# Patient Record
Sex: Female | Born: 1985
Health system: Southern US, Community
[De-identification: ages and names within clinical notes are randomized; demographics above are authoritative.]

## PROBLEM LIST (undated history)

## (undated) ENCOUNTER — Ambulatory Visit

## (undated) ENCOUNTER — Inpatient Hospital Stay (HOSPITAL_COMMUNITY): Payer: Self-pay

## (undated) DIAGNOSIS — D249 Benign neoplasm of unspecified breast: Secondary | ICD-10-CM

## (undated) DIAGNOSIS — K219 Gastro-esophageal reflux disease without esophagitis: Secondary | ICD-10-CM

## (undated) DIAGNOSIS — I499 Cardiac arrhythmia, unspecified: Secondary | ICD-10-CM

## (undated) DIAGNOSIS — Z8669 Personal history of other diseases of the nervous system and sense organs: Secondary | ICD-10-CM

## (undated) DIAGNOSIS — K649 Unspecified hemorrhoids: Secondary | ICD-10-CM

## (undated) DIAGNOSIS — T8859XA Other complications of anesthesia, initial encounter: Secondary | ICD-10-CM

## (undated) DIAGNOSIS — Z8679 Personal history of other diseases of the circulatory system: Secondary | ICD-10-CM

## (undated) DIAGNOSIS — G43909 Migraine, unspecified, not intractable, without status migrainosus: Secondary | ICD-10-CM

## (undated) DIAGNOSIS — Z3490 Encounter for supervision of normal pregnancy, unspecified, unspecified trimester: Secondary | ICD-10-CM

## (undated) DIAGNOSIS — E669 Obesity, unspecified: Secondary | ICD-10-CM

## (undated) DIAGNOSIS — Z8719 Personal history of other diseases of the digestive system: Secondary | ICD-10-CM

## (undated) DIAGNOSIS — E282 Polycystic ovarian syndrome: Secondary | ICD-10-CM

## (undated) DIAGNOSIS — T4145XA Adverse effect of unspecified anesthetic, initial encounter: Secondary | ICD-10-CM

## (undated) DIAGNOSIS — Z8489 Family history of other specified conditions: Secondary | ICD-10-CM

## (undated) DIAGNOSIS — G35 Multiple sclerosis: Secondary | ICD-10-CM

## (undated) DIAGNOSIS — R011 Cardiac murmur, unspecified: Secondary | ICD-10-CM

## (undated) DIAGNOSIS — M47819 Spondylosis without myelopathy or radiculopathy, site unspecified: Secondary | ICD-10-CM

## (undated) DIAGNOSIS — O039 Complete or unspecified spontaneous abortion without complication: Secondary | ICD-10-CM

## (undated) HISTORY — DX: Multiple sclerosis: G35

## (undated) HISTORY — PX: CARDIAC ELECTROPHYSIOLOGY MAPPING AND ABLATION: SHX1292

## (undated) HISTORY — DX: Complete or unspecified spontaneous abortion without complication: O03.9

## (undated) HISTORY — DX: Polycystic ovarian syndrome: E28.2

## (undated) HISTORY — PX: CARDIAC ELECTROPHYSIOLOGY STUDY AND ABLATION: SHX1294

## (undated) HISTORY — DX: Encounter for supervision of normal pregnancy, unspecified, unspecified trimester: Z34.90

## (undated) HISTORY — DX: Personal history of other diseases of the digestive system: Z87.19

## (undated) HISTORY — PX: UNILATERAL SALPINGECTOMY: SHX6160

## (undated) HISTORY — PX: TOENAIL EXCISION: SHX183

## (undated) HISTORY — PX: OVARIAN CYST REMOVAL: SHX89

## (undated) HISTORY — DX: Cardiac murmur, unspecified: R01.1

---
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
## 2004-05-24 ENCOUNTER — Ambulatory Visit (HOSPITAL_COMMUNITY): Admission: RE | Admit: 2004-05-24 | Discharge: 2004-05-24 | Payer: Self-pay | Admitting: Obstetrics

## 2004-06-12 ENCOUNTER — Encounter: Admission: RE | Admit: 2004-06-12 | Discharge: 2004-06-12 | Payer: Self-pay | Admitting: Obstetrics

## 2004-07-27 ENCOUNTER — Ambulatory Visit (HOSPITAL_COMMUNITY): Admission: RE | Admit: 2004-07-27 | Discharge: 2004-07-27 | Payer: Self-pay | Admitting: Surgery

## 2004-07-27 ENCOUNTER — Encounter (INDEPENDENT_AMBULATORY_CARE_PROVIDER_SITE_OTHER): Payer: Self-pay | Admitting: *Deleted

## 2004-07-27 HISTORY — PX: BREAST SURGERY: SHX581

## 2006-04-02 ENCOUNTER — Encounter: Payer: Self-pay | Admitting: Pediatrics

## 2006-09-04 ENCOUNTER — Emergency Department (HOSPITAL_COMMUNITY): Admission: EM | Admit: 2006-09-04 | Discharge: 2006-09-04 | Payer: Self-pay | Admitting: Family Medicine

## 2008-03-29 ENCOUNTER — Emergency Department (HOSPITAL_COMMUNITY): Admission: EM | Admit: 2008-03-29 | Discharge: 2008-03-30 | Payer: Self-pay | Admitting: Emergency Medicine

## 2008-04-27 ENCOUNTER — Encounter (INDEPENDENT_AMBULATORY_CARE_PROVIDER_SITE_OTHER): Payer: Self-pay | Admitting: General Surgery

## 2008-04-27 ENCOUNTER — Ambulatory Visit (HOSPITAL_COMMUNITY): Admission: RE | Admit: 2008-04-27 | Discharge: 2008-04-28 | Payer: Self-pay | Admitting: General Surgery

## 2008-04-27 HISTORY — PX: CHOLECYSTECTOMY: SHX55

## 2008-07-18 ENCOUNTER — Emergency Department (HOSPITAL_COMMUNITY): Admission: EM | Admit: 2008-07-18 | Discharge: 2008-07-18 | Payer: Self-pay | Admitting: Emergency Medicine

## 2008-08-19 ENCOUNTER — Emergency Department (HOSPITAL_COMMUNITY): Admission: EM | Admit: 2008-08-19 | Discharge: 2008-08-19 | Payer: Self-pay | Admitting: Family Medicine

## 2008-10-28 ENCOUNTER — Emergency Department (HOSPITAL_COMMUNITY): Admission: EM | Admit: 2008-10-28 | Discharge: 2008-10-28 | Payer: Self-pay | Admitting: Emergency Medicine

## 2009-02-16 ENCOUNTER — Emergency Department (HOSPITAL_COMMUNITY): Admission: EM | Admit: 2009-02-16 | Discharge: 2009-02-16 | Payer: Self-pay | Admitting: Emergency Medicine

## 2009-02-22 ENCOUNTER — Other Ambulatory Visit: Admission: RE | Admit: 2009-02-22 | Discharge: 2009-02-22 | Payer: Self-pay | Admitting: Obstetrics and Gynecology

## 2009-02-22 ENCOUNTER — Encounter: Admission: RE | Admit: 2009-02-22 | Discharge: 2009-02-22 | Payer: Self-pay | Admitting: Obstetrics and Gynecology

## 2009-05-18 ENCOUNTER — Encounter: Admission: RE | Admit: 2009-05-18 | Discharge: 2009-05-18 | Payer: Self-pay | Admitting: Obstetrics & Gynecology

## 2009-06-02 ENCOUNTER — Encounter (INDEPENDENT_AMBULATORY_CARE_PROVIDER_SITE_OTHER): Payer: Self-pay | Admitting: Gastroenterology

## 2009-06-02 ENCOUNTER — Ambulatory Visit (HOSPITAL_COMMUNITY): Admission: RE | Admit: 2009-06-02 | Discharge: 2009-06-02 | Payer: Self-pay | Admitting: Gastroenterology

## 2009-12-02 HISTORY — PX: BREAST LUMPECTOMY: SHX2

## 2009-12-30 ENCOUNTER — Emergency Department (HOSPITAL_COMMUNITY): Admission: EM | Admit: 2009-12-30 | Discharge: 2009-12-30 | Payer: Self-pay | Admitting: Family Medicine

## 2011-03-10 ENCOUNTER — Inpatient Hospital Stay (INDEPENDENT_AMBULATORY_CARE_PROVIDER_SITE_OTHER)
Admission: RE | Admit: 2011-03-10 | Discharge: 2011-03-10 | Disposition: A | Discharge status: Home / Self Care | Payer: Self-pay | Source: Ambulatory Visit | Attending: Emergency Medicine | Admitting: Emergency Medicine

## 2011-03-10 DIAGNOSIS — M545 Low back pain, unspecified: Secondary | ICD-10-CM

## 2011-03-14 LAB — DIFFERENTIAL
Basophils Absolute: 0.1 10*3/uL (ref 0.0–0.1)
Basophils Relative: 1 % (ref 0–1)
Eosinophils Absolute: 0.2 10*3/uL (ref 0.0–0.7)
Eosinophils Relative: 2 % (ref 0–5)
Lymphocytes Relative: 27 % (ref 12–46)
Lymphs Abs: 2.5 10*3/uL (ref 0.7–4.0)
Monocytes Absolute: 0.5 10*3/uL (ref 0.1–1.0)
Monocytes Relative: 5 % (ref 3–12)
Neutro Abs: 6.3 10*3/uL (ref 1.7–7.7)
Neutrophils Relative %: 66 % (ref 43–77)

## 2011-03-14 LAB — CBC
HCT: 35.6 % — ABNORMAL LOW (ref 36.0–46.0)
Hemoglobin: 11.6 g/dL — ABNORMAL LOW (ref 12.0–15.0)
MCHC: 32.8 g/dL (ref 30.0–36.0)
MCV: 78.4 fL (ref 78.0–100.0)
Platelets: 284 10*3/uL (ref 150–400)
RBC: 4.54 MIL/uL (ref 3.87–5.11)
RDW: 15.1 % (ref 11.5–15.5)
WBC: 9.5 10*3/uL (ref 4.0–10.5)

## 2011-03-14 LAB — COMPREHENSIVE METABOLIC PANEL
ALT: 17 U/L (ref 0–35)
AST: 15 U/L (ref 0–37)
Albumin: 3.3 g/dL — ABNORMAL LOW (ref 3.5–5.2)
Alkaline Phosphatase: 47 U/L (ref 39–117)
BUN: 10 mg/dL (ref 6–23)
CO2: 24 mEq/L (ref 19–32)
Calcium: 8.8 mg/dL (ref 8.4–10.5)
Chloride: 107 mEq/L (ref 96–112)
Creatinine, Ser: 0.58 mg/dL (ref 0.4–1.2)
GFR calc Af Amer: >60 mL/min (ref >60)
GFR calc non Af Amer: >60 mL/min (ref >60)
Glucose, Bld: 121 mg/dL — ABNORMAL HIGH (ref 70–99)
Potassium: 3.7 mEq/L (ref 3.5–5.1)
Sodium: 138 mEq/L (ref 135–145)
Total Bilirubin: 0.4 mg/dL (ref 0.3–1.2)
Total Protein: 6.5 g/dL (ref 6.0–8.3)

## 2011-03-14 LAB — URINALYSIS, ROUTINE W REFLEX MICROSCOPIC
Bilirubin Urine: NEGATIVE
Glucose, UA: NEGATIVE mg/dL
Hgb urine dipstick: NEGATIVE
Ketones, ur: NEGATIVE mg/dL
Nitrite: NEGATIVE
Protein, ur: NEGATIVE mg/dL
Specific Gravity, Urine: 1.023 (ref 1.005–1.030)
Urobilinogen, UA: 0.2 mg/dL (ref 0.0–1.0)
pH: 6.5 (ref 5.0–8.0)

## 2011-03-14 LAB — LIPASE, BLOOD: Lipase: 27 U/L (ref 11–59)

## 2011-03-14 LAB — PREGNANCY, URINE: Preg Test, Ur: NEGATIVE

## 2011-03-29 ENCOUNTER — Emergency Department (HOSPITAL_COMMUNITY)
Admission: EM | Admit: 2011-03-29 | Discharge: 2011-03-30 | Disposition: A | Discharge status: Home / Self Care | Payer: Self-pay | Attending: Emergency Medicine | Admitting: Emergency Medicine

## 2011-03-29 DIAGNOSIS — M545 Low back pain, unspecified: Secondary | ICD-10-CM | POA: Insufficient documentation

## 2011-03-29 DIAGNOSIS — R609 Edema, unspecified: Secondary | ICD-10-CM | POA: Insufficient documentation

## 2011-03-29 DIAGNOSIS — Z79899 Other long term (current) drug therapy: Secondary | ICD-10-CM | POA: Insufficient documentation

## 2011-03-29 DIAGNOSIS — E669 Obesity, unspecified: Secondary | ICD-10-CM | POA: Insufficient documentation

## 2011-03-29 DIAGNOSIS — L723 Sebaceous cyst: Secondary | ICD-10-CM | POA: Insufficient documentation

## 2011-04-16 NOTE — Op Note (Signed)
Lynn Roberts, Lynn Roberts             ACCOUNT NO.:  [id number]   MEDICAL RECORD NO.:  [id number]          PATIENT TYPE:  AMB   LOCATION:  DAY                          FACILITY:  [facility]   PHYSICIAN:  Adolph Pollack, M.D.DATE OF BIRTH:  06/15/1986   DATE OF PROCEDURE:  04/27/2008  DATE OF DISCHARGE:                               OPERATIVE REPORT   PREOPERATIVE DIAGNOSIS:  Symptomatic cholelithiasis.   POSTOPERATIVE DIAGNOSIS:  Symptomatic cholelithiasis.   PROCEDURE:  Laparoscopic cholecystectomy with intraoperative  cholangiogram.   SURGEON:  Adolph Pollack, M.D.   ASSISTANT:  Alfonse Ras, MD.   ANESTHESIA:  General.   INDICATIONS:  A 25 year old female had fairly classic case of biliary  colic and has a known cholelithiasis with a 12 mm gallstone.  She  continues to have intermittent episodes.  She now presents for elective  cholecystectomy.  The procedure risks and aftercare were discussed with  her preoperatively.   TECHNIQUE:  She was seen in holding area, then brought to the operating  room, placed supine on the operating table and a general anesthetic was  administered.  Abdominal wall was sterilely prepped and draped.  Dilute  Marcaine solution was infiltrated in the supraumbilical region.  A  supraumbilical incision was made through the skin and subcutaneous  tissue and then using blunt dissection the midline fascia identified.  An incision was made in the midline fascia and the peritoneal cavity  entered under direct vision.  A pursestring suture of 0 Vicryl was  placed around the fascial edges.  A Hassan trocar was introduced into  the peritoneal cavity and a pneumoperitoneum created by insufflation of  CO2 gas.   Next, a laparoscope was introduced.  She is placed in a reverse  Trendelenburg position, the right side tilted up.  An 11 mm trocar was  placed through an epigastric incision, two 5 mm trocars placed in the  right mid lateral abdomen.   The  fundus of the gallbladder was identified.  Adhesions at the omentum  and body the gallbladder were noted all the way down to the infundibulum  and these were carefully taken down, staying on the gallbladder.  The  fundus of the gallbladder was then retracted toward the right shoulder  and the infundibulum was retracted laterally.  Using blunt dissection I  mobilized the infundibulum.  She had a lot of fibrofatty tissue adherent  to the gallbladder.  I was then able to isolate the cystic duct.  Using  blunt dissection, a window was created around the cystic duct.  A clip  was placed at the cystic duct gallbladder junction.  A small incision  was then made in the cystic duct.  A cholangiocath was passed through  the anterior abdominal wall and placed into the cystic duct and a  cholangiogram performed.   Under real time fluoroscopy dilute contrast was injected to the cystic  duct which was of moderate to long length.  The common hepatic, right  and left hepatic, common bile ducts all opacified promptly and contrast  drained promptly into the duodenum without obvious evidence of  obstruction.  Final reports pending the radiologist's interpretation.   The cholangiocatheter was removed.  The cystic duct was then clipped  three times on the biliary side and then divided.  I then identified  both an anterior and posterior branch of the cystic artery close to the  gallbladder and these were clipped and divided after windows were  created around them.  The gallbladder was then dissected free from liver  intact using electrocautery.  The gallbladder and fossa was irrigated  and was hemostatic.  No bile leak was noted.   The gallbladder was then removed through the supraumbilical port in the  Endopouch bag and the subumbilical fascial defect closed under  laparoscopic vision by tightening up and tying down the pursestring  suture.  Irrigation fluid, which had been used during the case, was   evacuated.  The remaining trocars were removed and pneumoperitoneum was  released.   Skin incisions were closed with 4-0 Monocryl subcuticular stitches,  followed by Steri-Strips and sterile dressings.  She tolerated the  procedure well without any apparent complications and was taken to the  recovery room in satisfactory condition.      Adolph Pollack, M.D.  Electronically Signed     TJR/MEDQ  D:  04/27/2008  T:  04/27/2008  Job:  [id number]

## 2011-04-19 NOTE — Op Note (Signed)
Lynn Roberts, Lynn Roberts                       ACCOUNT NO.:  [id number]   MEDICAL RECORD NO.:  [id number]                   PATIENT TYPE:  OIB   LOCATION:  2899                                 FACILITY:  [facility]   PHYSICIAN:  Thornton Park. Daphine Deutscher, M.D.             DATE OF BIRTH:  10/13/1986   DATE OF PROCEDURE:  07/27/2004  DATE OF DISCHARGE:  07/27/2004                                 OPERATIVE REPORT   PREOPERATIVE DIAGNOSIS:  Nipple discharge with probable intraductal  papilloma.   POSTOPERATIVE DIAGNOSIS:  Nipple discharge with probable intraductal  papilloma.   PROCEDURE:  Right retro-areolar dissection with excision of large duct  containing probable papilloma.   SURGEON:  Thornton Park. Daphine Deutscher, M.D.   ANESTHESIA:  General endotracheal anesthesia.   DESCRIPTION OF PROCEDURE:  The patient was taken to room two at [facility] and given general anesthesia.  She did get 5,000 units of heparin  subcutaneous and also had PASO's placed.  The right breast was prepped with  Betadine and draped sterilely.  The duct that was draining material was  cannulated with a tear duct probe. An infra-areolar incision was made and  cairned up into the retro nipple region.  The dilated duct was found and  excised along with some adjacent ductules.  This was all sent for permanent  sections. I did localized the growth in the duct and put a suture on it.  I  spoke with Dr. Berneta Levins about it.  The wound was irrigated and I injected  some Marcaine into ithe area.  Bleeding was controlled and then I closed the  breast with 4-0 Vicryl subcuticularly and subcutaneously with Benzoin and  Steri-Strips.  The patient seemed to tolerate this procedure well. She was  taken to the recovery room in satisfactory condition.  She was given Tylox  (20) to take for pain and will be followed up in the office in approximately  two weeks.                                               Thornton Park Daphine Deutscher,  M.D.    MBM/MEDQ  D:  07/27/2004  T:  07/28/2004  Job:  [id number]   cc:   Leonette Most A. Clearance Coots, M.D.   Prabhakar D. Pendse, M.D.  [address], [address]  [place]  [place] [address]  Fax: [phone]

## 2011-08-26 ENCOUNTER — Inpatient Hospital Stay (INDEPENDENT_AMBULATORY_CARE_PROVIDER_SITE_OTHER)
Admission: RE | Admit: 2011-08-26 | Discharge: 2011-08-26 | Disposition: A | Discharge status: Home / Self Care | Payer: Self-pay | Source: Ambulatory Visit | Attending: Family Medicine | Admitting: Family Medicine

## 2011-08-26 DIAGNOSIS — J40 Bronchitis, not specified as acute or chronic: Secondary | ICD-10-CM

## 2011-08-27 LAB — DIFFERENTIAL
Basophils Absolute: 0.1
Basophils Relative: 1
Eosinophils Absolute: 0.1
Eosinophils Relative: 2
Lymphocytes Relative: 36
Lymphs Abs: 3.2
Monocytes Absolute: 0.2
Monocytes Relative: 2 — ABNORMAL LOW
Neutro Abs: 5.4
Neutrophils Relative %: 59

## 2011-08-27 LAB — COMPREHENSIVE METABOLIC PANEL
ALT: 12
AST: 21
Albumin: 3.4 — ABNORMAL LOW
Alkaline Phosphatase: 52
BUN: 14
CO2: 25
Calcium: 8.9
Chloride: 103
Creatinine, Ser: 0.73
GFR calc Af Amer: >60
GFR calc non Af Amer: >60
Glucose, Bld: 75
Potassium: 4
Sodium: 137
Total Bilirubin: 0.1 — ABNORMAL LOW
Total Protein: 6.9

## 2011-08-27 LAB — CBC
HCT: 36.4
Hemoglobin: 11.9 — ABNORMAL LOW
MCHC: 32.6
MCV: 75.1 — ABNORMAL LOW
Platelets: 309
RBC: 4.85
RDW: 15.3
WBC: 9

## 2011-08-27 LAB — URINALYSIS, ROUTINE W REFLEX MICROSCOPIC
Bilirubin Urine: NEGATIVE
Glucose, UA: NEGATIVE
Hgb urine dipstick: NEGATIVE
Ketones, ur: NEGATIVE
Nitrite: NEGATIVE
Protein, ur: NEGATIVE
Specific Gravity, Urine: 1.028
Urobilinogen, UA: 0.2
pH: 6

## 2011-08-27 LAB — LIPASE, BLOOD: Lipase: 24

## 2011-08-27 LAB — POCT PREGNANCY, URINE
Operator id: 295021
Preg Test, Ur: NEGATIVE

## 2011-08-28 LAB — DIFFERENTIAL
Basophils Absolute: 0.1
Basophils Relative: 1
Eosinophils Absolute: 0.1
Eosinophils Relative: 1
Lymphocytes Relative: 21
Lymphs Abs: 2
Monocytes Absolute: 0.5
Monocytes Relative: 6
Neutro Abs: 6.8
Neutrophils Relative %: 71

## 2011-08-28 LAB — COMPREHENSIVE METABOLIC PANEL
ALT: 17
AST: 15
Albumin: 3.3 — ABNORMAL LOW
Alkaline Phosphatase: 46
BUN: 9
CO2: 29
Calcium: 9.2
Chloride: 107
Creatinine, Ser: 0.63
GFR calc Af Amer: >60
GFR calc non Af Amer: >60
Glucose, Bld: 93
Potassium: 4.2
Sodium: 143
Total Bilirubin: 0.7
Total Protein: 6.8

## 2011-08-28 LAB — CBC
HCT: 35.3 — ABNORMAL LOW
Hemoglobin: 11.5 — ABNORMAL LOW
MCHC: 32.6
MCV: 75.4 — ABNORMAL LOW
Platelets: 289
RBC: 4.67
RDW: 15.7 — ABNORMAL HIGH
WBC: 9.5

## 2011-08-28 LAB — PREGNANCY, URINE: Preg Test, Ur: NEGATIVE

## 2011-09-02 LAB — COMPREHENSIVE METABOLIC PANEL
ALT: 17
AST: 24
Albumin: 3.7
Alkaline Phosphatase: 46
BUN: 7
CO2: 29
Calcium: 9.5
Chloride: 104
Creatinine, Ser: 0.57
GFR calc Af Amer: >60
GFR calc non Af Amer: >60
Glucose, Bld: 99
Potassium: 4.2
Sodium: 140
Total Bilirubin: 0.4
Total Protein: 7.3

## 2011-09-02 LAB — URINALYSIS, ROUTINE W REFLEX MICROSCOPIC
Bilirubin Urine: NEGATIVE
Glucose, UA: NEGATIVE
Hgb urine dipstick: NEGATIVE
Ketones, ur: NEGATIVE
Nitrite: NEGATIVE
Protein, ur: NEGATIVE
Specific Gravity, Urine: 1.022
Urobilinogen, UA: 0.2
pH: 7.5

## 2011-09-02 LAB — DIFFERENTIAL
Basophils Absolute: 0.1
Basophils Relative: 1
Eosinophils Absolute: 0.2
Eosinophils Relative: 1
Lymphocytes Relative: 23
Lymphs Abs: 2.8
Monocytes Absolute: 0.5
Monocytes Relative: 4
Neutro Abs: 8.6 — ABNORMAL HIGH
Neutrophils Relative %: 71

## 2011-09-02 LAB — CBC
HCT: 39.5
Hemoglobin: 12.8
MCHC: 32.5
MCV: 77.1 — ABNORMAL LOW
Platelets: 345
RBC: 5.12 — ABNORMAL HIGH
RDW: 15.6 — ABNORMAL HIGH
WBC: 12.1 — ABNORMAL HIGH

## 2011-09-02 LAB — LIPASE, BLOOD: Lipase: 20

## 2011-09-02 LAB — PREGNANCY, URINE: Preg Test, Ur: NEGATIVE

## 2011-09-03 LAB — GC/CHLAMYDIA PROBE AMP, GENITAL
Chlamydia, DNA Probe: NEGATIVE
GC Probe Amp, Genital: NEGATIVE

## 2011-09-03 LAB — WET PREP, GENITAL
Trich, Wet Prep: NONE SEEN
Yeast Wet Prep HPF POC: NONE SEEN

## 2011-09-03 LAB — POCT URINALYSIS DIP (DEVICE)
Bilirubin Urine: NEGATIVE
Glucose, UA: NEGATIVE mg/dL
Ketones, ur: NEGATIVE mg/dL
Nitrite: NEGATIVE
Protein, ur: NEGATIVE mg/dL
Specific Gravity, Urine: 1.025 (ref 1.005–1.030)
Urobilinogen, UA: 0.2 mg/dL (ref 0.0–1.0)
pH: 5.5 (ref 5.0–8.0)

## 2011-09-03 LAB — POCT PREGNANCY, URINE: Preg Test, Ur: NEGATIVE

## 2012-03-02 ENCOUNTER — Emergency Department (INDEPENDENT_AMBULATORY_CARE_PROVIDER_SITE_OTHER)
Admission: EM | Admit: 2012-03-02 | Discharge: 2012-03-02 | Disposition: A | Discharge status: Home / Self Care | Payer: Self-pay | Source: Home / Self Care | Attending: Emergency Medicine | Admitting: Emergency Medicine

## 2012-03-02 ENCOUNTER — Encounter (HOSPITAL_COMMUNITY): Payer: Self-pay | Admitting: *Deleted

## 2012-03-02 DIAGNOSIS — H109 Unspecified conjunctivitis: Secondary | ICD-10-CM

## 2012-03-02 DIAGNOSIS — S058X9A Other injuries of unspecified eye and orbit, initial encounter: Secondary | ICD-10-CM

## 2012-03-02 DIAGNOSIS — L089 Local infection of the skin and subcutaneous tissue, unspecified: Secondary | ICD-10-CM

## 2012-03-02 DIAGNOSIS — S0502XA Injury of conjunctiva and corneal abrasion without foreign body, left eye, initial encounter: Secondary | ICD-10-CM

## 2012-03-02 HISTORY — DX: Obesity, unspecified: E66.9

## 2012-03-02 MED ORDER — KETOTIFEN FUMARATE 0.025 % OP SOLN: 1.0000 [drp] | Freq: Two times a day (BID) | OPHTHALMIC | Status: AC

## 2012-03-02 MED ORDER — TETRACAINE HCL 0.5 % OP SOLN
1.0000 [drp] | Freq: Once | OPHTHALMIC | Status: AC
  Administered 2012-03-02: 1 [drp] via OPHTHALMIC

## 2012-03-02 MED ORDER — DOXYCYCLINE HYCLATE 100 MG PO CAPS: 100.0000 mg | ORAL_CAPSULE | Freq: Two times a day (BID) | ORAL | Status: AC

## 2012-03-02 MED ORDER — TOBRAMYCIN 0.3 % OP SOLN: 1.0000 [drp] | OPHTHALMIC | Status: AC

## 2012-03-02 MED ORDER — POLYETHYL GLYCOL-PROPYL GLYCOL 0.4-0.3 % OP SOLN: 1.0000 [drp] | Freq: Four times a day (QID) | OPHTHALMIC | Status: DC | PRN

## 2012-03-02 MED ORDER — TETRACAINE HCL 0.5 % OP SOLN
OPHTHALMIC | Status: AC
  Filled 2012-03-02: qty 2

## 2012-03-02 NOTE — ED Notes (Signed)
Pt  Reports  Redness   Irritation  And  Swelling  Of  Left eye  She  Reports  The  Symptoms    X  sev  Weeks  She  Reports  The r  Eye  Has  Some  Irritation as  Well   -  She  Also  Reports  A  Pimple  Inside  Her r  Ear

## 2012-03-02 NOTE — Discharge Instructions (Signed)
Use Systane as much as you want for comfort. Followup with your eye Dr. in several days. They need to know that you had red eyes since being fitted for contact lenses several weeks ago. Start doing warm compresses on the ear, and he may apply antibiotic ointment such as bacitracin to the area. Return to the ER if you start to have blisters in your ear, hair, or over your face. Make sure you finish all the antibiotics, even if you get better.

## 2012-03-02 NOTE — ED Provider Notes (Signed)
History     CSN: [id number]  Arrival date & time 03/02/12  1617   First MD Initiated Contact with Patient 03/02/12 1643      Chief Complaint  Patient presents with  . Eye Problem    (Consider location/radiation/quality/duration/timing/severity/associated sxs/prior treatment) HPI Comments: Patient reports  bilateral eye redness, left more than right, increased tearing starting several weeks ago. Patient states her eyes feel gritty. Scant discharge in the morning. No itching. No visual changes, nausea, vomiting, photophobia, Periorbital swelling, fever.  no sneezing, rhinorrhea. Patient states that symptoms started after being fitted for contact lenses. She has not been wearing her contacts for at least a week. Does not wear glasses. No known sick contacts. No history of seasonal allergies. Patient also reports pain Over her right external ear canal starting about 4 days ago. States she feels that it is draining, and going into the ear. No changes in her hearing, no recent trauma to her ear. No other facial or ear rash. Patient has been otherwise usual state of health prior to her symptoms starting.   Patient is a 26 y.o. female presenting with eye problem and ear pain. The history is provided by the patient.  Eye Problem  The current episode started more than 1 week ago. The problem occurs constantly. The problem has not changed since onset.There is pain in both eyes. The injury mechanism was contact lenses. The patient is experiencing no pain. There is no history of trauma to the eye. There is no known exposure to pink eye. She wears contacts. Associated symptoms include discharge, foreign body sensation, eye redness and itching. Pertinent negatives include no numbness, no blurred vision, no decreased vision, no photophobia and no nausea. She has tried eye drops for the symptoms. The treatment provided no relief.  Otalgia This is a new problem. The current episode started more than 2 days ago.  There is pain in the right ear. The problem occurs constantly. There has been no fever. Associated symptoms include rash. Pertinent negatives include no ear discharge, no headaches, no hearing loss, no rhinorrhea and no sore throat.    Past Medical History  Diagnosis Date  . Obesity   . Ovarian cyst   . Hypertension     Past Surgical History  Procedure Date  . Cystectomy   . Cholecystectomy   . Cardiac surgery     for paroxysmal atrial tachycardia    History reviewed. No pertinent family history.  History  Substance Use Topics  . Smoking status: Never Smoker   . Smokeless tobacco: Not on file  . Alcohol Use: No    OB History    Grav Para Term Preterm Abortions TAB SAB Ect Mult Living                  Review of Systems  Constitutional: Negative for fever.  HENT: Positive for ear pain. Negative for hearing loss, sore throat, facial swelling, rhinorrhea, sneezing and ear discharge.   Eyes: Positive for discharge, redness and itching. Negative for blurred vision, photophobia and visual disturbance.  Gastrointestinal: Negative for nausea.  Skin: Positive for itching and rash.  Neurological: Negative for numbness and headaches.    Allergies  Sulfonamide derivatives  Home Medications   Current Outpatient Rx  Name Route Sig Dispense Refill  . HYDROCHLOROTHIAZIDE 25 MG PO TABS Oral Take 25 mg by mouth daily.    Marland Kitchen PROPYLENE GLYCOL 0.6 % OP SOLN Ophthalmic Apply to eye.    Marland Kitchen DOXYCYCLINE HYCLATE 100  MG PO CAPS Oral Take 1 capsule (100 mg total) by mouth 2 (two) times daily. 20 capsule 0  . KETOTIFEN FUMARATE 0.025 % OP SOLN Both Eyes Place 1 drop into both eyes 2 (two) times daily. 5 mL 0  . POLYETHYL GLYCOL-PROPYL GLYCOL 0.4-0.3 % OP SOLN Ophthalmic Apply 1 drop to eye 4 (four) times daily as needed. 5 mL 0  . TOBRAMYCIN SULFATE 0.3 % OP SOLN Both Eyes Place 1 drop into both eyes every 4 (four) hours. X 5 days 5 mL 0    BP 153/82  Pulse 100  Temp(Src) 98.5 F (36.9 C)  (Oral)  SpO2 97%  Physical Exam  Nursing note and vitals reviewed. Constitutional: She is oriented to person, place, and time. She appears well-developed and well-nourished. No distress.  HENT:  Head: Normocephalic and atraumatic.  Right Ear: Hearing, tympanic membrane and ear canal normal.  Left Ear: External ear normal.  Ears:  Nose: Nose normal.       No other rash in or around the ear, or on patient's face  Eyes: EOM and lids are normal. Pupils are equal, round, and reactive to light. No foreign bodies found. Right eye exhibits no discharge and no hordeolum. No foreign body present in the right eye. Left eye exhibits no discharge and no hordeolum. No foreign body present in the left eye. Right conjunctiva is injected. Left conjunctiva is injected. Left conjunctiva has no hemorrhage. No scleral icterus.         Visual acuity: Left eye 20/40, right eye 20/50. Small cornea abrasion see drawing  Neck: Normal range of motion. Neck supple.  Cardiovascular: Normal rate.   Pulmonary/Chest: Effort normal.  Abdominal: She exhibits no distension.  Musculoskeletal: Normal range of motion.  Neurological: She is alert and oriented to person, place, and time.  Skin: Skin is warm and dry.  Psychiatric: She has a normal mood and affect. Her behavior is normal. Judgment and thought content normal.    ED Course  Procedures (including critical care time)  Labs Reviewed - No data to display No results found.   1. Conjunctivitis   2. Corneal abrasion, left   3. Skin infection       MDM  Pressure noted. Advised patient to stop using the Visine. Will have her followup with her eye Dr. in several days. Patient has a small cornea abrasion left side. Sending home with Tobrex. Patient reports bilateral conjunctivitis for 2 weeks, feel that this may be either from irritation or allergy. Will send home with antihistamines eyedrops. Patient also has a small, superficial skin infection over her right  external ear canal. No evidence of shingles. TM normal. Will Start her on doxycycline, topical abx for this. She is to follow with her regular Dr. for this no improvement.  Luiz Blare, MD 03/02/12 2130

## 2013-02-14 ENCOUNTER — Emergency Department (HOSPITAL_COMMUNITY)
Admission: EM | Admit: 2013-02-14 | Discharge: 2013-02-14 | Disposition: A | Discharge status: Home / Self Care | Payer: BC Managed Care – PPO | Source: Home / Self Care

## 2013-02-14 ENCOUNTER — Encounter (HOSPITAL_COMMUNITY): Payer: Self-pay

## 2013-02-14 DIAGNOSIS — J029 Acute pharyngitis, unspecified: Secondary | ICD-10-CM

## 2013-02-14 HISTORY — DX: Polycystic ovarian syndrome: E28.2

## 2013-02-14 LAB — POCT RAPID STREP A: Streptococcus, Group A Screen (Direct): NEGATIVE

## 2013-02-14 MED ORDER — AMOXICILLIN-POT CLAVULANATE 875-125 MG PO TABS: 1.0000 | ORAL_TABLET | Freq: Two times a day (BID) | ORAL | Status: DC

## 2013-02-14 NOTE — ED Notes (Signed)
C/o cough, ST, body aches for past few days; NAD

## 2013-02-14 NOTE — ED Provider Notes (Signed)
SUBJECTIVE:  Lynn Roberts is a 27 y.o. female who complains of coryza, congestion, sneezing, sore throat, swollen glands, post nasal drip, productive cough, headache, chills, pain while swallowing and enlarged tonsils for 10 days. She denies a history of chest pain, dizziness, fevers, myalgias, shortness of breath, sweats, vomiting, weakness and weight loss and denies a history of asthma. Patient denies smoke cigarettes.   Past Medical History  Diagnosis Date  . Obesity   . Ovarian cyst   . Hypertension   . PCOD (polycystic ovarian disease)    Past Surgical History  Procedure Laterality Date  . Cystectomy    . Cholecystectomy    . Cardiac surgery      for paroxysmal atrial tachycardia   History reviewed. No pertinent family history.  History  Substance Use Topics  . Smoking status: Never Smoker   . Smokeless tobacco: Not on file  . Alcohol Use: No    OBJECTIVE: She appears well, vital signs are as noted. Ears normal.  Throat and pharynx erythema with enlarged tonsils bilaterally without exudate.  Neck supple. Mild adenopathy in the neck. Nose is congested. Sinuses non tender. The chest is clear, without wheezes or rales.  Strep swab Negative.   ASSESSMENT:  viral pharyngitis, tonsillitis.   PLAN: Symptomatic therapy suggested: push fluids, rest and return office visit prn if symptoms persist or worsen. Lack of antibiotic effectiveness discussed with her but given wait and see prescription.  Call or return to clinic prn if these symptoms worsen or fail to improve as anticipated.   Judi Saa, DO 02/14/13 1400

## 2013-02-14 NOTE — ED Provider Notes (Signed)
Medical screening examination/treatment/procedure(s) were performed by a resident physician and as supervising physician I was immediately available for consultation/collaboration.  Leslee Home, M.D.  Reuben Likes, MD 02/14/13 [phone]

## 2013-07-08 ENCOUNTER — Encounter (HOSPITAL_COMMUNITY): Payer: Self-pay | Admitting: Emergency Medicine

## 2013-07-08 ENCOUNTER — Emergency Department (HOSPITAL_COMMUNITY): Payer: BC Managed Care – PPO

## 2013-07-08 ENCOUNTER — Emergency Department (HOSPITAL_COMMUNITY)
Admission: EM | Admit: 2013-07-08 | Discharge: 2013-07-08 | Disposition: A | Discharge status: Home / Self Care | Payer: BC Managed Care – PPO | Attending: Emergency Medicine | Admitting: Emergency Medicine

## 2013-07-08 DIAGNOSIS — Z8742 Personal history of other diseases of the female genital tract: Secondary | ICD-10-CM | POA: Insufficient documentation

## 2013-07-08 DIAGNOSIS — E669 Obesity, unspecified: Secondary | ICD-10-CM | POA: Insufficient documentation

## 2013-07-08 DIAGNOSIS — M25511 Pain in right shoulder: Secondary | ICD-10-CM

## 2013-07-08 DIAGNOSIS — R296 Repeated falls: Secondary | ICD-10-CM | POA: Insufficient documentation

## 2013-07-08 DIAGNOSIS — Z8639 Personal history of other endocrine, nutritional and metabolic disease: Secondary | ICD-10-CM | POA: Insufficient documentation

## 2013-07-08 DIAGNOSIS — IMO0002 Reserved for concepts with insufficient information to code with codable children: Secondary | ICD-10-CM | POA: Insufficient documentation

## 2013-07-08 DIAGNOSIS — I10 Essential (primary) hypertension: Secondary | ICD-10-CM | POA: Insufficient documentation

## 2013-07-08 DIAGNOSIS — Z862 Personal history of diseases of the blood and blood-forming organs and certain disorders involving the immune mechanism: Secondary | ICD-10-CM | POA: Insufficient documentation

## 2013-07-08 DIAGNOSIS — M545 Low back pain, unspecified: Secondary | ICD-10-CM

## 2013-07-08 DIAGNOSIS — Y9289 Other specified places as the place of occurrence of the external cause: Secondary | ICD-10-CM | POA: Insufficient documentation

## 2013-07-08 DIAGNOSIS — Y939 Activity, unspecified: Secondary | ICD-10-CM | POA: Insufficient documentation

## 2013-07-08 DIAGNOSIS — W19XXXA Unspecified fall, initial encounter: Secondary | ICD-10-CM

## 2013-07-08 DIAGNOSIS — Z79899 Other long term (current) drug therapy: Secondary | ICD-10-CM | POA: Insufficient documentation

## 2013-07-08 DIAGNOSIS — S4980XA Other specified injuries of shoulder and upper arm, unspecified arm, initial encounter: Secondary | ICD-10-CM | POA: Insufficient documentation

## 2013-07-08 DIAGNOSIS — S46909A Unspecified injury of unspecified muscle, fascia and tendon at shoulder and upper arm level, unspecified arm, initial encounter: Secondary | ICD-10-CM | POA: Insufficient documentation

## 2013-07-08 MED ORDER — IBUPROFEN 600 MG PO TABS: 600.0000 mg | ORAL_TABLET | Freq: Four times a day (QID) | ORAL | Status: DC | PRN

## 2013-07-08 MED ORDER — METHOCARBAMOL 500 MG PO TABS: 500.0000 mg | ORAL_TABLET | Freq: Two times a day (BID) | ORAL | Status: DC

## 2013-07-08 NOTE — ED Notes (Signed)
States husband is pt upstairs and she went to use restroom last night and toliett fell off wall and she hurt rt shoulder and lower back and tailbone

## 2013-07-08 NOTE — ED Provider Notes (Signed)
CSN: [id number]     Arrival date & time 07/08/13  1339 History     First MD Initiated Contact with Patient 07/08/13 1419     Chief Complaint  Patient presents with  . Shoulder Pain  . Fall   (Consider location/radiation/quality/duration/timing/severity/associated sxs/prior Treatment) HPI Comments: Patient presents with a chief complaint of right shoulder pain and lower back pain.  She reports that the pain has been present since falling last evening.  She thinks that she may have landed on her shoulder when she fell.  She was apparently sitting on a toilet in the hospital when the toilet fell out of the wall causing her to fall to the ground of the bathroom.  She denies hitting her head.  She has been ambulatory since the fall.  She has not been taking anything for the pain.   Pain in the shoulder and pain in the lower back both worse with movement.    The history is provided by the patient.    Past Medical History  Diagnosis Date  . Obesity   . Ovarian cyst   . Hypertension   . PCOD (polycystic ovarian disease)    Past Surgical History  Procedure Laterality Date  . Cystectomy    . Cholecystectomy    . Cardiac surgery      for paroxysmal atrial tachycardia   No family history on file. History  Substance Use Topics  . Smoking status: Never Smoker   . Smokeless tobacco: Not on file  . Alcohol Use: No   OB History   Grav Para Term Preterm Abortions TAB SAB Ect Mult Living                 Review of Systems  Allergies  Tramadol and Sulfonamide derivatives  Home Medications   Current Outpatient Rx  Name  Route  Sig  Dispense  Refill  . furosemide (LASIX) 40 MG tablet   Oral   Take 40 mg by mouth 2 (two) times daily. Takes 40mg  in the am & 20mg  in the pm         . gabapentin (NEURONTIN) 300 MG capsule   Oral   Take 900 mg by mouth 3 (three) times daily.          Marland Kitchen HYDROcodone-acetaminophen (NORCO) 10-325 MG per tablet   Oral   Take 1 tablet by mouth every 6  (six) hours as needed for pain. For pain         . potassium chloride SA (K-DUR,KLOR-CON) 20 MEQ tablet   Oral   Take 20 mEq by mouth 2 (two) times daily.          BP 147/87  Pulse 97  Temp(Src) 98.2 F (36.8 C)  Resp 20  SpO2 97%  LMP 07/08/2013 Physical Exam  Nursing note and vitals reviewed. Constitutional: She appears well-developed and well-nourished. No distress.  HENT:  Head: Normocephalic and atraumatic.  Neck: Normal range of motion. Neck supple.  Cardiovascular: Normal rate, regular rhythm and normal heart sounds.   Pulses:      Radial pulses are 2+ on the right side, and 2+ on the left side.  Pulmonary/Chest: Effort normal and breath sounds normal.  Musculoskeletal:       Right shoulder: She exhibits tenderness and bony tenderness. She exhibits no swelling and no deformity.       Cervical back: She exhibits normal range of motion, no tenderness, no bony tenderness, no swelling, no edema and no deformity.  Thoracic back: She exhibits normal range of motion, no tenderness, no bony tenderness, no swelling, no edema and no deformity.       Lumbar back: She exhibits tenderness and bony tenderness. She exhibits normal range of motion, no swelling, no edema and no deformity.  Neurological: She is alert. She has normal strength. No sensory deficit. Gait normal.  Skin: Skin is warm and dry. She is not diaphoretic.  Psychiatric: She has a normal mood and affect.    ED Course   Procedures (including critical care time)  Labs Reviewed - No data to display Dg Lumbar Spine Complete  07/08/2013   *RADIOLOGY REPORT*  Clinical Data: Low back pain  LUMBAR SPINE - COMPLETE 4+ VIEW  Comparison: 05/07/2012  Findings: Five lumbar type vertebral bodies are well visualized. Vertebral body height is well-maintained. Mild disc space narrowing is noted at L4-5.  No anterolisthesis or retrolisthesis is noted.  IMPRESSION: Mild degenerative change without acute abnormality.   Original  Report Authenticated By: Alcide Clever, M.D.   Dg Shoulder Right  07/08/2013   *RADIOLOGY REPORT*  Clinical Data: Right shoulder pain  RIGHT SHOULDER - 2+ VIEW  Comparison: None.  Findings: No acute fracture or dislocation is identified.  No gross soft tissue abnormality is seen.  The underlying bony thorax is unremarkable.  IMPRESSION: No acute abnormalities seen.   Original Report Authenticated By: Alcide Clever, M.D.   No diagnosis found.  MDM  Patient presenting with right shoulder pain and lower back pain after falling last evening.  Xrays negative.  Patient able to ambulate.  Patient neurovascularly intact.  Feel that the patient is stable for discharge.  Pascal Lux [place], PA-C 07/09/13 [phone]

## 2013-07-14 NOTE — ED Provider Notes (Signed)
Medical screening examination/treatment/procedure(s) were performed by non-physician practitioner and as supervising physician I was immediately available for consultation/collaboration.   Latanja Lehenbauer B. Bernette Mayers, MD 07/14/13 1349

## 2013-08-10 DIAGNOSIS — G4730 Sleep apnea, unspecified: Secondary | ICD-10-CM | POA: Insufficient documentation

## 2013-08-10 DIAGNOSIS — K5790 Diverticulosis of intestine, part unspecified, without perforation or abscess without bleeding: Secondary | ICD-10-CM | POA: Insufficient documentation

## 2013-08-10 DIAGNOSIS — G8929 Other chronic pain: Secondary | ICD-10-CM | POA: Insufficient documentation

## 2013-08-10 DIAGNOSIS — I10 Essential (primary) hypertension: Secondary | ICD-10-CM | POA: Insufficient documentation

## 2013-08-10 DIAGNOSIS — M1710 Unilateral primary osteoarthritis, unspecified knee: Secondary | ICD-10-CM | POA: Insufficient documentation

## 2013-12-02 HISTORY — PX: BREAST LUMPECTOMY: SHX2

## 2014-01-02 HISTORY — PX: GASTROPLASTY DUODENAL SWITCH: SHX1699

## 2014-01-17 ENCOUNTER — Emergency Department: Payer: Self-pay | Admitting: Emergency Medicine

## 2014-05-11 ENCOUNTER — Other Ambulatory Visit: Payer: Self-pay | Admitting: Obstetrics & Gynecology

## 2014-05-11 DIAGNOSIS — N6452 Nipple discharge: Secondary | ICD-10-CM

## 2014-05-20 ENCOUNTER — Ambulatory Visit
Admission: RE | Admit: 2014-05-20 | Discharge: 2014-05-20 | Disposition: A | Discharge status: Home / Self Care | Payer: No Typology Code available for payment source | Source: Ambulatory Visit | Attending: Obstetrics & Gynecology | Admitting: Obstetrics & Gynecology

## 2014-05-20 ENCOUNTER — Ambulatory Visit
Admission: RE | Admit: 2014-05-20 | Discharge: 2014-05-20 | Disposition: A | Discharge status: Home / Self Care | Payer: BC Managed Care – PPO | Source: Ambulatory Visit | Attending: Obstetrics & Gynecology | Admitting: Obstetrics & Gynecology

## 2014-05-20 ENCOUNTER — Other Ambulatory Visit: Payer: Self-pay | Admitting: Obstetrics & Gynecology

## 2014-05-20 DIAGNOSIS — N6452 Nipple discharge: Secondary | ICD-10-CM

## 2014-05-27 ENCOUNTER — Other Ambulatory Visit: Payer: Self-pay | Admitting: Obstetrics & Gynecology

## 2014-05-27 ENCOUNTER — Ambulatory Visit
Admission: RE | Admit: 2014-05-27 | Discharge: 2014-05-27 | Disposition: A | Discharge status: Home / Self Care | Payer: No Typology Code available for payment source | Source: Ambulatory Visit | Attending: Obstetrics & Gynecology | Admitting: Obstetrics & Gynecology

## 2014-05-27 ENCOUNTER — Ambulatory Visit: Admission: RE | Admit: 2014-05-27 | Payer: BC Managed Care – PPO | Source: Ambulatory Visit

## 2014-05-27 ENCOUNTER — Ambulatory Visit
Admission: RE | Admit: 2014-05-27 | Discharge: 2014-05-27 | Disposition: A | Discharge status: Home / Self Care | Payer: BC Managed Care – PPO | Source: Ambulatory Visit | Attending: Obstetrics & Gynecology | Admitting: Obstetrics & Gynecology

## 2014-05-27 DIAGNOSIS — N6452 Nipple discharge: Secondary | ICD-10-CM

## 2014-06-08 ENCOUNTER — Ambulatory Visit (INDEPENDENT_AMBULATORY_CARE_PROVIDER_SITE_OTHER): Payer: Self-pay | Admitting: Surgery

## 2014-06-08 ENCOUNTER — Encounter (INDEPENDENT_AMBULATORY_CARE_PROVIDER_SITE_OTHER): Payer: Self-pay | Admitting: Surgery

## 2014-06-08 VITALS — BP 136/78 | HR 80 | Temp 97.5°F | Ht 71.0 in | Wt >= 6400 oz

## 2014-06-08 DIAGNOSIS — D249 Benign neoplasm of unspecified breast: Secondary | ICD-10-CM

## 2014-06-08 DIAGNOSIS — D242 Benign neoplasm of left breast: Secondary | ICD-10-CM

## 2014-06-08 NOTE — Progress Notes (Addendum)
Re:   Lynn Roberts DOB:   [birth year] MRN:   [id number]  ASSESSMENT AND PLAN: 1.  Papilloma of breast, left  I discussed excisional biopsy.  I discussed the indications and complicaitons of surgery.  Potential complications include, but are not limited to, infection, bleeding, nerve injury and the need for fur there surgery.  Will plan localizaiton (wire or seed loc) excision of the left breast mass.  She is going on vaca from 7/10 to 7/19.  I spoke to Dr. Radford Pax and she thought that the seed loc would be okay.  [Biopsy of left breast - papilloma.  DN 10/19/2014]  2.  Benign changes subareolar of the right breast  3.  HTN 4.  Obesity - weight 404, BMI - 56.4 5.  PCOD 6.  Bariatric surgery at [facility] - duodenal switch by Dr. Rolland Porter in Feb 2015  She had lost 100 pounds before surgery and has lost 100 pounds since surgery -  So she has lost a total of 200 pounds. 7.  Has Mirana for birth control during weight loss 8.  Cardiac ablation 2001 for PATS  Has done well since then 9.  Back disease - 2 disks "flattened out"  Chief Complaint  Patient presents with  . eval left breast   REFERRING PHYSICIAN: COLLINS, DANA, DO  HISTORY OF PRESENT ILLNESS: Lynn Roberts is a 28 y.o. (DOB: 08/10/1986)  white  female whose primary care physician is COLLINS, DANA, DO and comes to me today for left breast papilloma. She comes by herself.  I see her grandmother, Lynn Roberts, as a patient.  She has a family history of breast cancer in a grandmother at age 37. The patient has a history of right breast nipple discharge in 2005. At that time she underwent a right breast central duct excision which demonstrated a papilloma. She developed left breast nipple discharge in 2010 and in [place], [place] had surgery which also demonstrated a left papilloma.  She now notes left breast nipple discharge.  Mammogram - 05/11/2014 - Calcifications and tubular soft tissue density located within the    upper outer quadrant of the left breast. This is worrisome for possible DCIS and ultrasound-guided core biopsy is recommended. Cystic areas and tubular soft tissue density within the subareolar portion of the right breast. Ultrasound-guided core biopsy of this is recommended, as well.  [place] of left breast - 05/20/2014 - Within the left breast there is a tubular mass which contains  calcifications located within the left breast at 2 o'clock position 7 cm from the nipple. By ultrasound this area measures 3.3 x 0.8 x 0.9 cm in size. This is suspicious for possible DCIS and tissue sampling is recommended.   Path [phone]) - 05/27/2014 - Ductal papilloma at 2 o'clock left breast, right breast subareolar - fibrocystic changes.    Past Medical History  Diagnosis Date  . Obesity   . Ovarian cyst   . Hypertension   . PCOD (polycystic ovarian disease)       Past Surgical History  Procedure Laterality Date  . Cystectomy    . Cholecystectomy    . Cardiac surgery      for paroxysmal atrial tachycardia      Current Outpatient Prescriptions  Medication Sig Dispense Refill  . ibuprofen (ADVIL,MOTRIN) 600 MG tablet Take 1 tablet (600 mg total) by mouth every 6 (six) hours as needed for pain.  30 tablet  0  . omeprazole (PRILOSEC) 20 MG capsule Take 20  mg by mouth daily.       No current facility-administered medications for this visit.      Allergies  Allergen Reactions  . Tramadol Other (See Comments)    Hyperactivity   . Sulfonamide Derivatives Rash    REVIEW OF SYSTEMS: Skin:  No history of rash.  No history of abnormal moles. Infection:  No history of hepatitis or HIV.  No history of MRSA. Neurologic:  No history of stroke.  No history of seizure.  No history of headaches. Cardiac:  She had an ablation for PATS in 2001. She has had no further problems. Pulmonary:  Does not smoke cigarettes.  No asthma or bronchitis.  No OSA/CPAP.  Endocrine:  No diabetes. No thyroid disease.  Has  Mirana for birth control during weight loss. Gastrointestinal:  Bariatric surgery at [facility] - duodenal switch by Dr. Rolland Porter in Feb 2015.  No history of liver disease.  Gall bladder removed 2009.   No history of pancreas disease.  No history of colon disease. Urologic:  No history of kidney stones.  No history of bladder infections. Musculoskeletal:  Back pain, with 2 disks flattened.  Better with weigh loss. Hematologic:  No bleeding disorder.  No history of anemia.  Not anticoagulated. Psycho-social:  The patient is oriented.   The patient has no obvious psychologic or social impairment to understanding our conversation and plan.  SOCIAL and FAMILY HISTORY: Married. Does not work, but takes care of her grandmother, Lynn Roberts (who is a patient of mine) Has no children  PHYSICAL EXAM: BP 136/78  Pulse 80  Temp(Src) 97.5 F (36.4 C)  Ht 5\' 11"  (1.803 m)  Wt 404 lb (183.253 kg)  BMI 56.37 kg/m2  General: Obese woman who is alert and generally healthy appearing.  HEENT: Normal. Pupils equal. Neck: Supple. No mass.  No thyroid mass. Lymph Nodes:  No supraclavicular or cervical nodes. Lungs: Clear to auscultation and symmetric breath sounds. Heart:  RRR. No murmur or rub. Breast:  Right - large, not mass  Left - tattoo in upper breast.  Evidence of biopsy in lateral left breast. Abdomen: Soft. No mass. Scars from her duodenal switch. Rectal: Not done. Extremities:  Good strength and ROM  in upper and lower extremities. Neurologic:  Grossly intact to motor and sensory function. Psychiatric: Has normal mood and affect. Behavior is normal.   DATA REVIEWED: Path report to patient.  Alphonsa Overall, MD,  [facility], [address]  [place], [place]    [place] Phone:  [phone] FAX:  [phone]

## 2014-06-10 ENCOUNTER — Telehealth (INDEPENDENT_AMBULATORY_CARE_PROVIDER_SITE_OTHER): Payer: Self-pay | Admitting: Surgery

## 2014-06-10 NOTE — Telephone Encounter (Signed)
Called patient to advise about insurance, per our conversation patient can wait for surgery.

## 2014-07-21 ENCOUNTER — Emergency Department (HOSPITAL_COMMUNITY): Payer: No Typology Code available for payment source

## 2014-07-21 ENCOUNTER — Encounter (HOSPITAL_COMMUNITY): Payer: Self-pay | Admitting: Emergency Medicine

## 2014-07-21 ENCOUNTER — Emergency Department (HOSPITAL_COMMUNITY)
Admission: EM | Admit: 2014-07-21 | Discharge: 2014-07-21 | Disposition: A | Discharge status: Home / Self Care | Payer: No Typology Code available for payment source | Attending: Emergency Medicine | Admitting: Emergency Medicine

## 2014-07-21 DIAGNOSIS — S3981XA Other specified injuries of abdomen, initial encounter: Secondary | ICD-10-CM | POA: Insufficient documentation

## 2014-07-21 DIAGNOSIS — I10 Essential (primary) hypertension: Secondary | ICD-10-CM | POA: Insufficient documentation

## 2014-07-21 DIAGNOSIS — E669 Obesity, unspecified: Secondary | ICD-10-CM | POA: Diagnosis not present

## 2014-07-21 DIAGNOSIS — IMO0002 Reserved for concepts with insufficient information to code with codable children: Secondary | ICD-10-CM | POA: Insufficient documentation

## 2014-07-21 DIAGNOSIS — Z79899 Other long term (current) drug therapy: Secondary | ICD-10-CM | POA: Diagnosis not present

## 2014-07-21 DIAGNOSIS — Y9389 Activity, other specified: Secondary | ICD-10-CM | POA: Diagnosis not present

## 2014-07-21 DIAGNOSIS — Z8742 Personal history of other diseases of the female genital tract: Secondary | ICD-10-CM | POA: Diagnosis not present

## 2014-07-21 DIAGNOSIS — Y92410 Unspecified street and highway as the place of occurrence of the external cause: Secondary | ICD-10-CM | POA: Diagnosis not present

## 2014-07-21 MED ORDER — OXYCODONE-ACETAMINOPHEN 5-325 MG PO TABS: 1.0000 | ORAL_TABLET | Freq: Four times a day (QID) | ORAL | Status: DC | PRN

## 2014-07-21 MED ORDER — OXYCODONE-ACETAMINOPHEN 5-325 MG PO TABS
1.0000 | ORAL_TABLET | Freq: Once | ORAL | Status: AC
Start: 2014-07-21 — End: 2014-07-21
  Administered 2014-07-21: 1 via ORAL
  Filled 2014-07-21: qty 1
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
## 2014-07-25 NOTE — ED Provider Notes (Signed)
Medical screening examination/treatment/procedure(s) were performed by non-physician practitioner and as supervising physician I was immediately available for consultation/collaboration.   EKG Interpretation None       Leota Jacobsen, MD 07/25/14 [phone]

## 2014-07-28 ENCOUNTER — Ambulatory Visit (INDEPENDENT_AMBULATORY_CARE_PROVIDER_SITE_OTHER): Payer: BC Managed Care – PPO | Admitting: Surgery

## 2014-09-08 ENCOUNTER — Other Ambulatory Visit (INDEPENDENT_AMBULATORY_CARE_PROVIDER_SITE_OTHER): Payer: Self-pay | Admitting: Surgery

## 2014-09-08 DIAGNOSIS — D242 Benign neoplasm of left breast: Secondary | ICD-10-CM

## 2014-09-12 ENCOUNTER — Other Ambulatory Visit (INDEPENDENT_AMBULATORY_CARE_PROVIDER_SITE_OTHER): Payer: Self-pay | Admitting: Surgery

## 2014-09-12 DIAGNOSIS — D242 Benign neoplasm of left breast: Secondary | ICD-10-CM

## 2014-09-13 ENCOUNTER — Emergency Department (HOSPITAL_COMMUNITY)
Admission: EM | Admit: 2014-09-13 | Discharge: 2014-09-13 | Disposition: A | Discharge status: Home / Self Care | Payer: No Typology Code available for payment source | Attending: Emergency Medicine | Admitting: Emergency Medicine

## 2014-09-13 ENCOUNTER — Encounter (HOSPITAL_COMMUNITY): Payer: Self-pay | Admitting: Emergency Medicine

## 2014-09-13 DIAGNOSIS — M5431 Sciatica, right side: Secondary | ICD-10-CM | POA: Insufficient documentation

## 2014-09-13 DIAGNOSIS — R21 Rash and other nonspecific skin eruption: Secondary | ICD-10-CM | POA: Insufficient documentation

## 2014-09-13 DIAGNOSIS — R51 Headache: Secondary | ICD-10-CM | POA: Insufficient documentation

## 2014-09-13 DIAGNOSIS — R519 Headache, unspecified: Secondary | ICD-10-CM

## 2014-09-13 DIAGNOSIS — E669 Obesity, unspecified: Secondary | ICD-10-CM | POA: Insufficient documentation

## 2014-09-13 DIAGNOSIS — R110 Nausea: Secondary | ICD-10-CM | POA: Insufficient documentation

## 2014-09-13 DIAGNOSIS — Z8742 Personal history of other diseases of the female genital tract: Secondary | ICD-10-CM | POA: Insufficient documentation

## 2014-09-13 DIAGNOSIS — M5441 Lumbago with sciatica, right side: Secondary | ICD-10-CM

## 2014-09-13 DIAGNOSIS — Z79899 Other long term (current) drug therapy: Secondary | ICD-10-CM | POA: Insufficient documentation

## 2014-09-13 DIAGNOSIS — M545 Low back pain: Secondary | ICD-10-CM | POA: Insufficient documentation

## 2014-09-13 DIAGNOSIS — I10 Essential (primary) hypertension: Secondary | ICD-10-CM | POA: Insufficient documentation

## 2014-09-13 LAB — CBG MONITORING, ED: Glucose-Capillary: 102 mg/dL — ABNORMAL HIGH (ref 70–99)

## 2014-09-13 MED ORDER — HYDROCODONE-ACETAMINOPHEN 5-325 MG PO TABS: 1.0000 | ORAL_TABLET | Freq: Four times a day (QID) | ORAL | Status: DC | PRN

## 2014-09-13 MED ORDER — DIAZEPAM 5 MG PO TABS: 5.0000 mg | ORAL_TABLET | Freq: Two times a day (BID) | ORAL | Status: DC | PRN

## 2014-09-13 MED ORDER — CLOTRIMAZOLE-BETAMETHASONE 1-0.05 % EX CREA: TOPICAL_CREAM | CUTANEOUS | Status: DC

## 2014-09-13 MED ORDER — OXYCODONE-ACETAMINOPHEN 5-325 MG PO TABS
2.0000 | ORAL_TABLET | Freq: Once | ORAL | Status: AC
  Administered 2014-09-13: 2 via ORAL
  Filled 2014-09-13: qty 2
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
## 2014-09-13 NOTE — Discharge Instructions (Signed)
Apply Lotrisone as prescribed to rash. Do not apply to face. Take Norco as needed for pain control if your pain is not well controlled with Tylenol. Do not take Norco while taking Tylenol as there is already Tylenol and this medication. Take Valium as needed for back pain/spasms. Followup with an orthopedist for further evaluation of your back pain. Followup with a neurologist for further evaluation of your persistent headaches. Return to the emergency department if symptoms worsen.  Back Pain, Adult Low back pain is very common. About 1 in 5 people have back pain.The cause of low back pain is rarely dangerous. The pain often gets better over time.About half of people with a sudden onset of back pain feel better in just 2 weeks. About 8 in 10 people feel better by 6 weeks.  CAUSES Some common causes of back pain include:  Strain of the muscles or ligaments supporting the spine.  Wear and tear (degeneration) of the spinal discs.  Arthritis.  Direct injury to the back. DIAGNOSIS Most of the time, the direct cause of low back pain is not known.However, back pain can be treated effectively even when the exact cause of the pain is unknown.Answering your caregiver's questions about your overall health and symptoms is one of the most accurate ways to make sure the cause of your pain is not dangerous. If your caregiver needs more information, he or she may order lab work or imaging tests (X-rays or MRIs).However, even if imaging tests show changes in your back, this usually does not require surgery. HOME CARE INSTRUCTIONS For many people, back pain returns.Since low back pain is rarely dangerous, it is often a condition that people can learn to [facility] their own.   Remain active. It is stressful on the back to sit or stand in one place. Do not sit, drive, or stand in one place for more than 30 minutes at a time. Take short walks on level surfaces as soon as pain allows.Try to increase the length  of time you walk each day.  Do not stay in bed.Resting more than 1 or 2 days can delay your recovery.  Do not avoid exercise or work.Your body is made to move.It is not dangerous to be active, even though your back may hurt.Your back will likely heal faster if you return to being active before your pain is gone.  Pay attention to your body when you bend and lift. Many people have less discomfortwhen lifting if they bend their knees, keep the load close to their bodies,and avoid twisting. Often, the most comfortable positions are those that put less stress on your recovering back.  Find a comfortable position to sleep. Use a firm mattress and lie on your side with your knees slightly bent. If you lie on your back, put a pillow under your knees.  Only take over-the-counter or prescription medicines as directed by your caregiver. Over-the-counter medicines to reduce pain and inflammation are often the most helpful.Your caregiver may prescribe muscle relaxant drugs.These medicines help dull your pain so you can more quickly return to your normal activities and healthy exercise.  Put ice on the injured area.  Put ice in a plastic bag.  Place a towel between your skin and the bag.  Leave the ice on for 15-20 minutes, 03-04 times a day for the first 2 to 3 days. After that, ice and heat may be alternated to reduce pain and spasms.  Ask your caregiver about trying back exercises and gentle massage.  This may be of some benefit.  Avoid feeling anxious or stressed.Stress increases muscle tension and can worsen back pain.It is important to recognize when you are anxious or stressed and learn ways to manage it.Exercise is a great option. SEEK MEDICAL CARE IF:  You have pain that is not relieved with rest or medicine.  You have pain that does not improve in 1 week.  You have new symptoms.  You are generally not feeling well. SEEK IMMEDIATE MEDICAL CARE IF:   You have pain that  radiates from your back into your legs.  You develop new bowel or bladder control problems.  You have unusual weakness or numbness in your arms or legs.  You develop nausea or vomiting.  You develop abdominal pain.  You feel faint. Document Released: 11/18/2005 Document Revised: 05/19/2012 Document Reviewed: 03/22/2014 [facility] Patient Information 2015 [place], [place]. This information is not intended to replace advice given to you by your health care provider. Make sure you discuss any questions you have with your health care provider.

## 2014-09-17 NOTE — ED Provider Notes (Signed)
Medical screening examination/treatment/procedure(s) were performed by non-physician practitioner and as supervising physician I was immediately available for consultation/collaboration.   EKG Interpretation None       Charlesetta Shanks, MD 09/17/14 2357

## 2014-10-02 DIAGNOSIS — D249 Benign neoplasm of unspecified breast: Secondary | ICD-10-CM

## 2014-10-02 HISTORY — DX: Benign neoplasm of unspecified breast: D24.9

## 2014-10-11 ENCOUNTER — Encounter (HOSPITAL_BASED_OUTPATIENT_CLINIC_OR_DEPARTMENT_OTHER): Payer: Self-pay | Admitting: *Deleted

## 2014-10-11 NOTE — Pre-Procedure Instructions (Signed)
To come for anesthesia airway evaluation. 

## 2014-10-17 ENCOUNTER — Ambulatory Visit
Admission: RE | Admit: 2014-10-17 | Discharge: 2014-10-17 | Disposition: A | Discharge status: Home / Self Care | Payer: No Typology Code available for payment source | Source: Ambulatory Visit | Attending: Surgery | Admitting: Surgery

## 2014-10-17 DIAGNOSIS — D242 Benign neoplasm of left breast: Secondary | ICD-10-CM

## 2014-10-17 NOTE — H&P (Signed)
Re: Lynn Roberts DOB: Sep 29, 1986 MRN: [id number]  ASSESSMENT AND PLAN: 1. Papilloma of breast, left I discussed excisional biopsy. I discussed the indications and complicaitons of surgery. Potential complications include, but are not limited to, infection, bleeding, nerve injury and the need for fur there surgery. Will plan localizaiton (wire or seed loc) excision of the left breast mass. She is going on vaca from 7/10 to 7/19. I spoke to Dr. Radford Pax and she thought that the seed loc would be okay.  2. Benign changes subareolar of the right breast  3. HTN 4. Obesity - weight 404, BMI - 56.4 5. PCOD 6. Bariatric surgery at [facility] - duodenal switch by Dr. Rolland Porter in Feb 2015 She had lost 100 pounds before surgery and has lost 100 pounds since surgery - So she has lost a total of 200 pounds. 7. Has Mirana for birth control during weight loss 8. Cardiac ablation 2001 for PATS Has done well since then 9. Back disease - 2 disks "flattened out"  Chief Complaint  Patient presents with  . eval left breast   REFERRING PHYSICIAN: COLLINS, DANA, DO  HISTORY OF PRESENT ILLNESS: Lynn Roberts is a 28 y.o. (DOB: 1986-11-27) white female whose primary care physician is COLLINS, DANA, DO and comes to me today for left breast papilloma. She comes by herself. I see her grandmother, Lynn Roberts, as a patient.  She has a family history of breast cancer in a grandmother at age 12. The patient has a history of right breast nipple discharge in 2005. At that time she underwent a right breast central duct excision which demonstrated a papilloma. She developed left breast nipple discharge in 2010 and in [place], [place] had surgery which also demonstrated a left papilloma.  She now notes left breast nipple discharge.  Mammogram - 05/11/2014 - Calcifications and tubular soft tissue density located within the  upper  outer quadrant of the left breast. This is worrisome for possible DCIS and ultrasound-guided core biopsy is recommended. Cystic areas and tubular soft tissue density within the subareolar portion of the right breast. Ultrasound-guided core biopsy of this is recommended, as well.  [place] of left breast - 05/20/2014 - Within the left breast there is a tubular mass which contains  calcifications located within the left breast at 2 o'clock position 7 cm from the nipple. By ultrasound this area measures 3.3 x 0.8 x 0.9 cm in size. This is suspicious for possible DCIS and tissue sampling is recommended.   Path [id number]) - 05/27/2014 - Ductal papilloma at 2 o'clock left breast, right breast subareolar - fibrocystic changes.   Past Medical History  Diagnosis Date  . Obesity   . Ovarian cyst   . Hypertension   . PCOD (polycystic ovarian disease)      Past Surgical History  Procedure Laterality Date  . Cystectomy    . Cholecystectomy    . Cardiac surgery      for paroxysmal atrial tachycardia     Current Outpatient Prescriptions  Medication Sig Dispense Refill  . ibuprofen (ADVIL,MOTRIN) 600 MG tablet Take 1 tablet (600 mg total) by mouth every 6 (six) hours as needed for pain. 30 tablet 0  . omeprazole (PRILOSEC) 20 MG capsule Take 20 mg by mouth daily.     No current facility-administered medications for this visit.     Allergies  Allergen Reactions  . Tramadol Other (See Comments)    Hyperactivity   . Sulfonamide Derivatives Rash    REVIEW OF  SYSTEMS: Skin: No history of rash. No history of abnormal moles. Infection: No history of hepatitis or HIV. No history of MRSA. Neurologic: No history of stroke. No history of seizure. No history of headaches. Cardiac: She had an ablation for PATS in 2001. She has had no further problems. Pulmonary: Does not smoke  cigarettes. No asthma or bronchitis. No OSA/CPAP.  Endocrine: No diabetes. No thyroid disease. Has Mirana for birth control during weight loss. Gastrointestinal: Bariatric surgery at [facility] - duodenal switch by Dr. Rolland Porter in Feb 2015. No history of liver disease. Gall bladder removed 2009. No history of pancreas disease. No history of colon disease. Urologic: No history of kidney stones. No history of bladder infections. Musculoskeletal: Back pain, with 2 disks flattened. Better with weigh loss. Hematologic: No bleeding disorder. No history of anemia. Not anticoagulated. Psycho-social: The patient is oriented. The patient has no obvious psychologic or social impairment to understanding our conversation and plan.  SOCIAL and FAMILY HISTORY: Married. Does not work, but takes care of her grandmother, Lynn Roberts (who is a patient of mine) Has no children  PHYSICAL EXAM: BP 136/78  Pulse 80  Temp(Src) 97.5 F (36.4 C)  Ht 5\' 11"  (1.803 m)  Wt 404 lb (183.253 kg)  BMI 56.37 kg/m2  General: Obese woman who is alert and generally healthy appearing.  HEENT: Normal. Pupils equal. Neck: Supple. No mass. No thyroid mass. Lymph Nodes: No supraclavicular or cervical nodes. Lungs: Clear to auscultation and symmetric breath sounds. Heart: RRR. No murmur or rub. Breast: Right - large, not mass Left - tattoo in upper breast. Evidence of biopsy in lateral left breast. Abdomen: Soft. No mass. Scars from her duodenal switch. Rectal: Not done. Extremities: Good strength and ROM in upper and lower extremities. Neurologic: Grossly intact to motor and sensory function. Psychiatric: Has normal mood and affect. Behavior is normal.   DATA REVIEWED: Path report to patient.  Alphonsa Overall, MD, [facility], [address] [place], [place] Phone: [phone]:  [phone]

## 2014-10-18 ENCOUNTER — Ambulatory Visit (HOSPITAL_BASED_OUTPATIENT_CLINIC_OR_DEPARTMENT_OTHER)
Admission: RE | Admit: 2014-10-18 | Discharge: 2014-10-18 | Disposition: A | Discharge status: Home / Self Care | Payer: No Typology Code available for payment source | Source: Ambulatory Visit | Attending: Surgery | Admitting: Surgery

## 2014-10-18 ENCOUNTER — Ambulatory Visit (HOSPITAL_BASED_OUTPATIENT_CLINIC_OR_DEPARTMENT_OTHER): Payer: No Typology Code available for payment source | Admitting: Certified Registered"

## 2014-10-18 ENCOUNTER — Encounter (HOSPITAL_BASED_OUTPATIENT_CLINIC_OR_DEPARTMENT_OTHER): Payer: Self-pay | Admitting: *Deleted

## 2014-10-18 ENCOUNTER — Encounter (HOSPITAL_BASED_OUTPATIENT_CLINIC_OR_DEPARTMENT_OTHER)
Admission: RE | Disposition: A | Discharge status: Home / Self Care | Payer: Self-pay | Source: Ambulatory Visit | Attending: Surgery

## 2014-10-18 ENCOUNTER — Ambulatory Visit
Admission: RE | Admit: 2014-10-18 | Discharge: 2014-10-18 | Disposition: A | Discharge status: Home / Self Care | Payer: No Typology Code available for payment source | Source: Ambulatory Visit | Attending: Surgery | Admitting: Surgery

## 2014-10-18 DIAGNOSIS — D242 Benign neoplasm of left breast: Secondary | ICD-10-CM

## 2014-10-18 DIAGNOSIS — Z6841 Body Mass Index (BMI) 40.0 and over, adult: Secondary | ICD-10-CM | POA: Insufficient documentation

## 2014-10-18 DIAGNOSIS — E282 Polycystic ovarian syndrome: Secondary | ICD-10-CM | POA: Insufficient documentation

## 2014-10-18 DIAGNOSIS — Z803 Family history of malignant neoplasm of breast: Secondary | ICD-10-CM | POA: Diagnosis not present

## 2014-10-18 DIAGNOSIS — E669 Obesity, unspecified: Secondary | ICD-10-CM | POA: Diagnosis not present

## 2014-10-18 DIAGNOSIS — Z882 Allergy status to sulfonamides status: Secondary | ICD-10-CM | POA: Diagnosis not present

## 2014-10-18 DIAGNOSIS — I10 Essential (primary) hypertension: Secondary | ICD-10-CM | POA: Insufficient documentation

## 2014-10-18 HISTORY — DX: Adverse effect of unspecified anesthetic, initial encounter: T41.45XA

## 2014-10-18 HISTORY — DX: Migraine, unspecified, not intractable, without status migrainosus: G43.909

## 2014-10-18 HISTORY — DX: Benign neoplasm of unspecified breast: D24.9

## 2014-10-18 HISTORY — DX: Personal history of other diseases of the circulatory system: Z86.79

## 2014-10-18 HISTORY — DX: Spondylosis without myelopathy or radiculopathy, site unspecified: M47.819

## 2014-10-18 HISTORY — PX: BREAST LUMPECTOMY WITH RADIOACTIVE SEED LOCALIZATION: SHX6424

## 2014-10-18 HISTORY — DX: Other complications of anesthesia, initial encounter: T88.59XA

## 2014-10-18 HISTORY — DX: Gastro-esophageal reflux disease without esophagitis: K21.9

## 2014-10-18 LAB — POCT HEMOGLOBIN-HEMACUE: Hemoglobin: 12.8 g/dL (ref 12.0–15.0)

## 2014-10-18 SURGERY — BREAST LUMPECTOMY WITH RADIOACTIVE SEED LOCALIZATION
Anesthesia: General | Site: Breast | Laterality: Left

## 2014-10-18 MED ORDER — CHLORHEXIDINE GLUCONATE 4 % EX LIQD: 1.0000 "application " | Freq: Once | CUTANEOUS | Status: DC

## 2014-10-18 MED ORDER — LACTATED RINGERS IV SOLN
INTRAVENOUS | Status: DC
  Administered 2014-10-18: 08:00:00 via INTRAVENOUS

## 2014-10-18 MED ORDER — FENTANYL CITRATE 0.05 MG/ML IJ SOLN: 50.0000 ug | INTRAMUSCULAR | Status: DC | PRN

## 2014-10-18 MED ORDER — HYDROMORPHONE HCL 1 MG/ML IJ SOLN
0.2500 mg | INTRAMUSCULAR | Status: DC | PRN
  Administered 2014-10-18 (×2): 0.5 mg via INTRAVENOUS

## 2014-10-18 MED ORDER — BUPIVACAINE-EPINEPHRINE (PF) 0.5% -1:200000 IJ SOLN
INTRAMUSCULAR | Status: AC
  Filled 2014-10-18: qty 30

## 2014-10-18 MED ORDER — OXYCODONE HCL 5 MG PO TABS
5.0000 mg | ORAL_TABLET | Freq: Once | ORAL | Status: AC | PRN
  Administered 2014-10-18: 5 mg via ORAL

## 2014-10-18 MED ORDER — PROPOFOL 10 MG/ML IV EMUL
INTRAVENOUS | Status: AC
  Filled 2014-10-18: qty 50

## 2014-10-18 MED ORDER — OXYCODONE HCL 5 MG PO TABS
ORAL_TABLET | ORAL | Status: AC
  Filled 2014-10-18: qty 1

## 2014-10-18 MED ORDER — OXYCODONE-ACETAMINOPHEN 5-325 MG PO TABS
ORAL_TABLET | ORAL | Status: AC
  Filled 2014-10-18: qty 1

## 2014-10-18 MED ORDER — LIDOCAINE HCL (CARDIAC) 20 MG/ML IV SOLN
INTRAVENOUS | Status: DC | PRN
  Administered 2014-10-18: 100 mg via INTRAVENOUS

## 2014-10-18 MED ORDER — MIDAZOLAM HCL 5 MG/5ML IJ SOLN
INTRAMUSCULAR | Status: DC | PRN
  Administered 2014-10-18: 2 mg via INTRAVENOUS

## 2014-10-18 MED ORDER — MIDAZOLAM HCL 2 MG/ML PO SYRP: 12.0000 mg | ORAL_SOLUTION | Freq: Once | ORAL | Status: DC | PRN

## 2014-10-18 MED ORDER — HYDROMORPHONE HCL 1 MG/ML IJ SOLN
INTRAMUSCULAR | Status: AC
  Filled 2014-10-18: qty 1

## 2014-10-18 MED ORDER — SCOPOLAMINE 1 MG/3DAYS TD PT72
1.0000 | MEDICATED_PATCH | TRANSDERMAL | Status: DC
  Administered 2014-10-18: 1.5 mg via TRANSDERMAL

## 2014-10-18 MED ORDER — SCOPOLAMINE 1 MG/3DAYS TD PT72
MEDICATED_PATCH | TRANSDERMAL | Status: AC
  Filled 2014-10-18: qty 1

## 2014-10-18 MED ORDER — DEXTROSE 5 % IV SOLN
3.0000 g | INTRAVENOUS | Status: AC
  Administered 2014-10-18: 3 g via INTRAVENOUS

## 2014-10-18 MED ORDER — ONDANSETRON HCL 4 MG/2ML IJ SOLN
INTRAMUSCULAR | Status: DC | PRN
  Administered 2014-10-18: 4 mg via INTRAVENOUS

## 2014-10-18 MED ORDER — BUPIVACAINE-EPINEPHRINE (PF) 0.5% -1:200000 IJ SOLN
INTRAMUSCULAR | Status: DC | PRN
  Administered 2014-10-18: 30 mL

## 2014-10-18 MED ORDER — FENTANYL CITRATE 0.05 MG/ML IJ SOLN
INTRAMUSCULAR | Status: DC | PRN
  Administered 2014-10-18: 100 ug via INTRAVENOUS

## 2014-10-18 MED ORDER — MIDAZOLAM HCL 2 MG/2ML IJ SOLN: 1.0000 mg | INTRAMUSCULAR | Status: DC | PRN

## 2014-10-18 MED ORDER — CHLORHEXIDINE GLUCONATE 4 % EX LIQD
1.0000 | Freq: Once | CUTANEOUS | Status: DC
Start: 2014-10-19 — End: 2014-10-18

## 2014-10-18 MED ORDER — DEXAMETHASONE SODIUM PHOSPHATE 4 MG/ML IJ SOLN
INTRAMUSCULAR | Status: DC | PRN
  Administered 2014-10-18: 10 mg via INTRAVENOUS

## 2014-10-18 MED ORDER — ONDANSETRON HCL 4 MG/2ML IJ SOLN: 4.0000 mg | Freq: Once | INTRAMUSCULAR | Status: DC | PRN

## 2014-10-18 MED ORDER — MIDAZOLAM HCL 2 MG/2ML IJ SOLN
INTRAMUSCULAR | Status: AC
  Filled 2014-10-18: qty 2

## 2014-10-18 MED ORDER — HYDROCODONE-ACETAMINOPHEN 5-325 MG PO TABS: 1.0000 | ORAL_TABLET | Freq: Four times a day (QID) | ORAL | Status: DC | PRN

## 2014-10-18 MED ORDER — CEFAZOLIN SODIUM-DEXTROSE 2-3 GM-% IV SOLR
INTRAVENOUS | Status: AC
  Filled 2014-10-18: qty 50

## 2014-10-18 MED ORDER — FENTANYL CITRATE 0.05 MG/ML IJ SOLN
INTRAMUSCULAR | Status: AC
  Filled 2014-10-18: qty 6

## 2014-10-18 MED ORDER — PROPOFOL 10 MG/ML IV BOLUS
INTRAVENOUS | Status: DC | PRN
  Administered 2014-10-18: 200 mg via INTRAVENOUS

## 2014-10-18 MED ORDER — CEFAZOLIN SODIUM 1-5 GM-% IV SOLN
INTRAVENOUS | Status: AC
  Filled 2014-10-18: qty 50

## 2014-10-18 MED ORDER — OXYCODONE HCL 5 MG/5ML PO SOLN: 5.0000 mg | Freq: Once | ORAL | Status: AC | PRN

## 2014-10-18 SURGICAL SUPPLY — 56 items
APL SKNCLS STERI-STRIP NONHPOA (GAUZE/BANDAGES/DRESSINGS)
BENZOIN TINCTURE PRP APPL 2/3 (GAUZE/BANDAGES/DRESSINGS) IMPLANT
BINDER BREAST 3XL (BIND) ×1 IMPLANT
BINDER BREAST LRG (GAUZE/BANDAGES/DRESSINGS) IMPLANT
BINDER BREAST MEDIUM (GAUZE/BANDAGES/DRESSINGS) IMPLANT
BINDER BREAST XLRG (GAUZE/BANDAGES/DRESSINGS) IMPLANT
BINDER BREAST XXLRG (GAUZE/BANDAGES/DRESSINGS) IMPLANT
BLADE HEX COATED 2.75 (ELECTRODE) IMPLANT
BLADE SURG 10 STRL SS (BLADE) ×2 IMPLANT
BLADE SURG 15 STRL LF DISP TIS (BLADE) ×1 IMPLANT
BLADE SURG 15 STRL SS (BLADE) ×2
CANISTER SUC SOCK COL 7IN (MISCELLANEOUS) IMPLANT
CANISTER SUCT 1200ML W/VALVE (MISCELLANEOUS) ×2 IMPLANT
CHLORAPREP W/TINT 26ML (MISCELLANEOUS) ×2 IMPLANT
CLIP TI WIDE RED SMALL 6 (CLIP) IMPLANT
COVER BACK TABLE 60X90IN (DRAPES) ×2 IMPLANT
COVER MAYO STAND STRL (DRAPES) ×2 IMPLANT
COVER PROBE W GEL 5X96 (DRAPES) ×2 IMPLANT
DECANTER SPIKE VIAL GLASS SM (MISCELLANEOUS) IMPLANT
DEVICE DUBIN W/COMP PLATE 8390 (MISCELLANEOUS) ×2 IMPLANT
DRAPE PED LAPAROTOMY (DRAPES) ×2 IMPLANT
DRAPE UTILITY XL STRL (DRAPES) ×2 IMPLANT
DRSG PAD ABDOMINAL 8X10 ST (GAUZE/BANDAGES/DRESSINGS) IMPLANT
ELECT COATED BLADE 2.86 ST (ELECTRODE) ×2 IMPLANT
ELECT REM PT RETURN 9FT ADLT (ELECTROSURGICAL) ×2
ELECTRODE REM PT RTRN 9FT ADLT (ELECTROSURGICAL) ×1 IMPLANT
GAUZE SPONGE 4X4 12PLY STRL (GAUZE/BANDAGES/DRESSINGS) ×1 IMPLANT
GLOVE BIO SURGEON STRL SZ 6.5 (GLOVE) ×2 IMPLANT
GLOVE BIOGEL PI IND STRL 7.0 (GLOVE) IMPLANT
GLOVE BIOGEL PI INDICATOR 7.0 (GLOVE) ×1
GLOVE EXAM NITRILE EXT CUFF MD (GLOVE) ×1 IMPLANT
GLOVE SURG SIGNA 7.5 PF LTX (GLOVE) ×3 IMPLANT
GOWN STRL REUS W/ TWL LRG LVL3 (GOWN DISPOSABLE) ×1 IMPLANT
GOWN STRL REUS W/ TWL XL LVL3 (GOWN DISPOSABLE) ×1 IMPLANT
GOWN STRL REUS W/TWL LRG LVL3 (GOWN DISPOSABLE) ×2
GOWN STRL REUS W/TWL XL LVL3 (GOWN DISPOSABLE) ×2
KIT MARKER MARGIN INK (KITS) ×2 IMPLANT
LIQUID BAND (GAUZE/BANDAGES/DRESSINGS) ×2 IMPLANT
NDL HYPO 25X1 1.5 SAFETY (NEEDLE) ×1 IMPLANT
NEEDLE HYPO 25X1 1.5 SAFETY (NEEDLE) ×2 IMPLANT
NS IRRIG 1000ML POUR BTL (IV SOLUTION) ×1 IMPLANT
PACK BASIN DAY SURGERY FS (CUSTOM PROCEDURE TRAY) ×2 IMPLANT
PENCIL BUTTON HOLSTER BLD 10FT (ELECTRODE) ×2 IMPLANT
PIN SAFETY STERILE (MISCELLANEOUS) IMPLANT
SHEET MEDIUM DRAPE 40X70 STRL (DRAPES) ×2 IMPLANT
SLEEVE SCD COMPRESS KNEE MED (MISCELLANEOUS) ×2 IMPLANT
SPONGE GAUZE 4X4 12PLY STER LF (GAUZE/BANDAGES/DRESSINGS) IMPLANT
SPONGE LAP 18X18 X RAY DECT (DISPOSABLE) ×2 IMPLANT
STRIP CLOSURE SKIN 1/4X4 (GAUZE/BANDAGES/DRESSINGS) IMPLANT
SUT MON AB 5-0 PS2 18 (SUTURE) ×2 IMPLANT
SUT VICRYL 3-0 CR8 SH (SUTURE) ×2 IMPLANT
SYR CONTROL 10ML LL (SYRINGE) ×2 IMPLANT
TOWEL OR 17X24 6PK STRL BLUE (TOWEL DISPOSABLE) ×2 IMPLANT
TOWEL OR NON WOVEN STRL DISP B (DISPOSABLE) ×1 IMPLANT
TUBE CONNECTING 20X1/4 (TUBING) ×2 IMPLANT
YANKAUER SUCT BULB TIP NO VENT (SUCTIONS) ×2 IMPLANT

## 2014-10-18 NOTE — Op Note (Signed)
10/18/2014  8:35 AM  PATIENT:  Lynn Roberts DOB: 1986/03/04 MRN: [id number]  PREOP DIAGNOSIS:  papilloma left breast (3 o'clock)  POSTOP DIAGNOSIS:   Papilloma left breast (3 o'clock)  PROCEDURE:   Procedure(s):   RADIOACTIVE SEED GUIDED EXCISIONAL BREAST BIOPSY  SURGEON:   Alphonsa Overall, M.D.  ANESTHESIA:   general  Anesthesiologist: Napoleon Form, MD CRNA: Baxter Flattery, CRNA; Tawni Millers, CRNA  General  EBL:  minimal  ml  DRAINS: none   LOCAL MEDICATIONS USED:   30 cc 1/4% marcaine  SPECIMEN:   Left breast biopsy (6 color paint)  COUNTS CORRECT:  YES  INDICATIONS FOR PROCEDURE:  [name] is a 28 y.o. (DOB: 08-26-1986)  female whose primary care physician is COLLINS, DANA, DO and comes for left breast lumpectomy for a papilloma of her left breast.   The options for treatment have been discussed with the patient. She elected to proceed with lumpectomy to excise this papilloma.    The indications and potential complications of surgery were explained to the patient. Potential complications include, but are not limited to, bleeding, infection, the need for further surgery, and nerve injury.     She had a I131 seed placed on 09/16/2014 in her left breast at [facility].  I confirmed the presence of the I131 seed in the pre op area using the Neoprobe.  The seed is in the 3 o'clock position of the left breast.  OPERATIVE NOTE:   The patient was taken to room # 8 at [facility] where she underwent a general anesthesia  supervised by Anesthesiologist: Napoleon Form, MD CRNA: Baxter Flattery, CRNA; Tawni Millers, CRNA. Her left breast and axilla were prepped with  ChloraPrep and sterilely draped.    A time-out and the surgical check list was reviewed.    I turned attention to the papilloma which was about at the 3 o'clock position of the left breast.   I used the Neoprobe to identify the I131 seed.  I tried to excise an area around the tumor of at least 1 cm.     I excised this block of breast tissue approximately 4 cm by 4 cm  in diameter.   I painted the lumpectomy specimen with the 6 color paint kit and did a specimen mammogram which confirmed the mass, clip, and the seed were all in the right position and the specimen.  There was a suture in the medial aspect of the specimen.  The specimen was sent to pathology who called back to confirm that they have the seed and the specimen.   I then irrigated the wound with saline. I infiltrated approximately 30 mL of 1% local between the  Incisions.   I then closed all the wounds in layers using 3-0 Vicryl sutures for the deep layer. At the skin, I closed the incisions with a 5-0 Monocryl suture. The incisions were then painted with Dermabond.  She had gauze place over the wounds and placed in a breast binder.   The patient tolerated the procedure well, was transported to the recovery room in good condition. Sponge and needle count were correct at the end of the case.   Final pathology is pending.   Alphonsa Overall, MD, [facility] Pager: [phone] Office phone:  [phone]

## 2014-10-18 NOTE — Discharge Instructions (Signed)
CENTRAL  SURGERY - DISCHARGE INSTRUCTIONS TO PATIENT  Activity:  Driving - May drive tomorrow if doing well   Lifting - Take it easy for about 5 days, then no limit  Wound Care:   Leave wound dry x 2 days, then shower.  Diet:  As tolerated  Follow up appointment:  Call Dr. Pollie Friar office [facility]) at [phone] for an appointment in 2 to 4 weeks  Medications and dosages:  Resume your home medications.  You have a prescription for:  Vicodin  Call Dr. Lucia Gaskins or his office  [phone]) if you have:  Temperature greater than 100.4,  Persistent nausea and vomiting,  Severe uncontrolled pain,  Redness, tenderness, or signs of infection (pain, swelling, redness, odor or green/yellow discharge around the site),  Difficulty breathing, headache or visual disturbances,  Any other questions or concerns you may have after discharge.  In an emergency, call 911 or go to an Emergency Department at a nearby hospital.   Post Anesthesia Home Care Instructions  Activity: Get plenty of rest for the remainder of the day. A responsible adult should stay with you for 24 hours following the procedure.  For the next 24 hours, DO NOT: -Drive a car -Paediatric nurse -Drink alcoholic beverages -Take any medication unless instructed by your physician -Make any legal decisions or sign important papers.  Meals: Start with liquid foods such as gelatin or soup. Progress to regular foods as tolerated. Avoid greasy, spicy, heavy foods. If nausea and/or vomiting occur, drink only clear liquids until the nausea and/or vomiting subsides. Call your physician if vomiting continues.  Special Instructions/Symptoms: Your throat may feel dry or sore from the anesthesia or the breathing tube placed in your throat during surgery. If this causes discomfort, gargle with warm salt water. The discomfort should disappear within 24 hours.

## 2014-10-18 NOTE — Anesthesia Postprocedure Evaluation (Signed)
  Anesthesia Post-op Note  Patient: Lynn Roberts  Procedure(s) Performed: Procedure(s):  RADIOACTIVE SEED GUIDED EXCISIONAL BREAST BIOPSY (Left)  Patient Location: PACU  Anesthesia Type: General   Level of Consciousness: awake, alert  and oriented  Airway and Oxygen Therapy: Patient Spontanous Breathing  Post-op Pain: mild  Post-op Assessment: Post-op Vital signs reviewed  Post-op Vital Signs: Reviewed  Last Vitals:  Filed Vitals:   10/18/14 0930  BP: 130/72  Pulse: 69  Temp:   Resp: 16    Complications: No apparent anesthesia complications

## 2014-10-18 NOTE — Transfer of Care (Signed)
Immediate Anesthesia Transfer of Care Note  Patient: Lynn Roberts  Procedure(s) Performed: Procedure(s):  RADIOACTIVE SEED GUIDED EXCISIONAL BREAST BIOPSY (Left)  Patient Location: PACU  Anesthesia Type:General  Level of Consciousness: awake  Airway & Oxygen Therapy: Patient Spontanous Breathing and Patient connected to face mask oxygen  Post-op Assessment: Report given to PACU RN and Post -op Vital signs reviewed and stable  Post vital signs: Reviewed and stable  Complications: No apparent anesthesia complications

## 2014-10-18 NOTE — Anesthesia Preprocedure Evaluation (Signed)
Anesthesia Evaluation  Patient identified by MRN, date of birth, ID band Patient awake    Reviewed: Allergy & Precautions, H&P , NPO status , Patient's Chart, lab work & pertinent test results  Airway Mallampati: I  TM Distance: >3 FB Neck ROM: Full    Dental  (+) Teeth Intact, Dental Advisory Given   Pulmonary  breath sounds clear to auscultation        Cardiovascular Rhythm:Regular Rate:Normal     Neuro/Psych    GI/Hepatic   Endo/Other  Morbid obesity  Renal/GU      Musculoskeletal   Abdominal   Peds  Hematology   Anesthesia Other Findings   Reproductive/Obstetrics                             Anesthesia Physical Anesthesia Plan  ASA: II  Anesthesia Plan: General   Post-op Pain Management:    Induction: Intravenous  Airway Management Planned: LMA  Additional Equipment:   Intra-op Plan:   Post-operative Plan: Extubation in OR  Informed Consent: I have reviewed the patients History and Physical, chart, labs and discussed the procedure including the risks, benefits and alternatives for the proposed anesthesia with the patient or authorized representative who has indicated his/her understanding and acceptance.   Dental advisory given  Plan Discussed with: Anesthesiologist, Surgeon and CRNA  Anesthesia Plan Comments:         Anesthesia Quick Evaluation

## 2014-10-18 NOTE — Interval H&P Note (Signed)
History and Physical Interval Note:  10/18/2014 7:34 AM  Lynn Roberts  has presented today for surgery, with the diagnosis of papilloma left breast   The various methods of treatment have been discussed with the patient and family. Her mother is in the waiting area.  Her aunt, Shirlean Mylar, and grandmother, Jolayne Haines, are in the holding area with her.  The seed appears to be in the correct position.  After consideration of risks, benefits and other options for treatment, the patient has consented to  Procedure(s):  RADIOACTIVE SEED GUIDED EXCISIONAL BREAST BIOPSY (Left) as a surgical intervention .  The patient's history has been reviewed, patient examined, no change in status, stable for surgery.  I have reviewed the patient's chart and labs.  Questions were answered to the patient's satisfaction.     Donnell Beauchamp H

## 2014-10-18 NOTE — Anesthesia Procedure Notes (Signed)
Procedure Name: LMA Insertion Date/Time: 10/18/2014 7:46 AM Performed by: Baxter Flattery Pre-anesthesia Checklist: Patient identified, Emergency Drugs available, Suction available and Patient being monitored Patient Re-evaluated:Patient Re-evaluated prior to inductionOxygen Delivery Method: Circle System Utilized Preoxygenation: Pre-oxygenation with 100% oxygen Intubation Type: IV induction Ventilation: Mask ventilation without difficulty LMA: LMA inserted LMA Size: 4.0 Number of attempts: 1 Airway Equipment and Method: bite block Placement Confirmation: positive ETCO2 and breath sounds checked- equal and bilateral Tube secured with: Tape Dental Injury: Teeth and Oropharynx as per pre-operative assessment

## 2014-10-19 ENCOUNTER — Encounter (HOSPITAL_BASED_OUTPATIENT_CLINIC_OR_DEPARTMENT_OTHER): Payer: Self-pay | Admitting: Surgery

## 2014-12-05 ENCOUNTER — Encounter (HOSPITAL_COMMUNITY): Payer: Self-pay | Admitting: *Deleted

## 2014-12-05 ENCOUNTER — Emergency Department (HOSPITAL_COMMUNITY): Payer: 59

## 2014-12-05 ENCOUNTER — Emergency Department (HOSPITAL_COMMUNITY)
Admission: EM | Admit: 2014-12-05 | Discharge: 2014-12-05 | Disposition: A | Discharge status: Home / Self Care | Payer: 59 | Attending: Emergency Medicine | Admitting: Emergency Medicine

## 2014-12-05 DIAGNOSIS — W19XXXA Unspecified fall, initial encounter: Secondary | ICD-10-CM

## 2014-12-05 DIAGNOSIS — Z8739 Personal history of other diseases of the musculoskeletal system and connective tissue: Secondary | ICD-10-CM | POA: Insufficient documentation

## 2014-12-05 DIAGNOSIS — Z8742 Personal history of other diseases of the female genital tract: Secondary | ICD-10-CM | POA: Insufficient documentation

## 2014-12-05 DIAGNOSIS — R55 Syncope and collapse: Secondary | ICD-10-CM | POA: Diagnosis not present

## 2014-12-05 DIAGNOSIS — Z8679 Personal history of other diseases of the circulatory system: Secondary | ICD-10-CM | POA: Diagnosis not present

## 2014-12-05 DIAGNOSIS — G8929 Other chronic pain: Secondary | ICD-10-CM

## 2014-12-05 DIAGNOSIS — Z3202 Encounter for pregnancy test, result negative: Secondary | ICD-10-CM | POA: Insufficient documentation

## 2014-12-05 DIAGNOSIS — S8992XA Unspecified injury of left lower leg, initial encounter: Secondary | ICD-10-CM | POA: Diagnosis present

## 2014-12-05 DIAGNOSIS — Z79899 Other long term (current) drug therapy: Secondary | ICD-10-CM | POA: Insufficient documentation

## 2014-12-05 DIAGNOSIS — E669 Obesity, unspecified: Secondary | ICD-10-CM | POA: Insufficient documentation

## 2014-12-05 DIAGNOSIS — M545 Low back pain: Secondary | ICD-10-CM

## 2014-12-05 DIAGNOSIS — W01198A Fall on same level from slipping, tripping and stumbling with subsequent striking against other object, initial encounter: Secondary | ICD-10-CM | POA: Diagnosis not present

## 2014-12-05 DIAGNOSIS — S3992XA Unspecified injury of lower back, initial encounter: Secondary | ICD-10-CM | POA: Diagnosis not present

## 2014-12-05 DIAGNOSIS — Y9389 Activity, other specified: Secondary | ICD-10-CM | POA: Insufficient documentation

## 2014-12-05 DIAGNOSIS — Y998 Other external cause status: Secondary | ICD-10-CM | POA: Diagnosis not present

## 2014-12-05 DIAGNOSIS — K219 Gastro-esophageal reflux disease without esophagitis: Secondary | ICD-10-CM | POA: Insufficient documentation

## 2014-12-05 DIAGNOSIS — M25562 Pain in left knee: Secondary | ICD-10-CM

## 2014-12-05 DIAGNOSIS — Y92091 Bathroom in other non-institutional residence as the place of occurrence of the external cause: Secondary | ICD-10-CM | POA: Diagnosis not present

## 2014-12-05 LAB — URINALYSIS, ROUTINE W REFLEX MICROSCOPIC
Glucose, UA: NEGATIVE mg/dL
Hgb urine dipstick: NEGATIVE
Ketones, ur: NEGATIVE mg/dL
Leukocytes, UA: NEGATIVE
Nitrite: NEGATIVE
Protein, ur: NEGATIVE mg/dL
Specific Gravity, Urine: 1.024 (ref 1.005–1.030)
Urobilinogen, UA: 0.2 mg/dL (ref 0.0–1.0)
pH: 5 (ref 5.0–8.0)

## 2014-12-05 LAB — CBC
HCT: 40 % (ref 36.0–46.0)
Hemoglobin: 12.8 g/dL (ref 12.0–15.0)
MCH: 26 pg (ref 26.0–34.0)
MCHC: 32 g/dL (ref 30.0–36.0)
MCV: 81.1 fL (ref 78.0–100.0)
Platelets: 275 K/uL (ref 150–400)
RBC: 4.93 MIL/uL (ref 3.87–5.11)
RDW: 14.7 % (ref 11.5–15.5)
WBC: 9.5 K/uL (ref 4.0–10.5)

## 2014-12-05 LAB — BASIC METABOLIC PANEL WITH GFR
Anion gap: 5 (ref 5–15)
BUN: 7 mg/dL (ref 6–23)
CO2: 27 mmol/L (ref 19–32)
Calcium: 8.6 mg/dL (ref 8.4–10.5)
Chloride: 106 meq/L (ref 96–112)
Creatinine, Ser: 0.56 mg/dL (ref 0.50–1.10)
GFR calc Af Amer: >90 mL/min
GFR calc non Af Amer: >90 mL/min
Glucose, Bld: 88 mg/dL (ref 70–99)
Potassium: 3.8 mmol/L (ref 3.5–5.1)
Sodium: 138 mmol/L (ref 135–145)

## 2014-12-05 LAB — POC URINE PREG, ED: Preg Test, Ur: NEGATIVE

## 2014-12-05 MED ORDER — HYDROCODONE-ACETAMINOPHEN 5-325 MG PO TABS
1.0000 | ORAL_TABLET | Freq: Once | ORAL | Status: AC
  Administered 2014-12-05: 1 via ORAL
  Filled 2014-12-05: qty 1

## 2014-12-05 MED ORDER — HYDROCODONE-ACETAMINOPHEN 5-325 MG PO TABS: 1.0000 | ORAL_TABLET | Freq: Four times a day (QID) | ORAL | Status: DC | PRN

## 2014-12-05 NOTE — ED Notes (Addendum)
Pt reports at about 0400 she got up and became dizzy, reports loss of consciousness and states she woke up on the floor and her left knee, left toes and lower back are causing her pain at 9/10. Pt does have a history of back pain.

## 2014-12-05 NOTE — ED Notes (Signed)
Pt reports possible syncopal episode last night. Woke up this am with left knee pain and left foot pain and lower back pain. History of back pain. Ambulatory at triage.

## 2014-12-05 NOTE — Discharge Instructions (Signed)
Wear knee compression sleeve and use crutches as needed for comfort. Ice and elevate knee throughout the day. Use hydrocodone as needed for pain relief. Do not drive or operate machinery with pain medication use. Call orthopedic follow up today or tomorrow to schedule followup appointment for recheck of ongoing knee pain in one to two weeks that can be canceled with a 24-48 hour notice if complete resolution of pain. See your regular doctor in 1 week for ongoing evaluation of your syncopal episode. Stay well hydrated and eat regularly. Return to the ER for changes or worsening symptoms.   Knee Pain Knee pain can be a result of an injury or other medical conditions. Treatment will depend on the cause of your pain. HOME CARE  Only take medicine as told by your doctor.  Keep a healthy weight. Being overweight can make the knee hurt more.  Stretch before exercising or playing sports.  If there is constant knee pain, change the way you exercise. Ask your doctor for advice.  Make sure shoes fit well. Choose the right shoe for the sport or activity.  Protect your knees. Wear kneepads if needed.  Rest when you are tired. GET HELP RIGHT AWAY IF:   Your knee pain does not stop.  Your knee pain does not get better.  Your knee joint feels hot to the touch.  You have a fever. MAKE SURE YOU:   Understand these instructions.  Will watch this condition.  Will get help right away if you are not doing well or get worse. Document Released: 02/14/2009 Document Revised: 02/10/2012 Document Reviewed: 02/14/2009 [facility] Patient Information 2015 [place], [place]. This information is not intended to replace advice given to you by your health care provider. Make sure you discuss any questions you have with your health care provider.   Vasovagal Syncope, Adult Syncope, commonly known as fainting, is a temporary loss of consciousness. It occurs when the blood flow to the brain is reduced. Vasovagal  syncope (also called neurocardiogenic syncope) is a fainting spell in which the blood flow to the brain is reduced because of a sudden drop in heart rate and blood pressure. Vasovagal syncope occurs when the brain and the cardiovascular system (blood vessels) do not adequately communicate and respond to each other. This is the most common cause of fainting. It often occurs in response to fear or some other type of emotional or physical stress. The body has a reaction in which the heart starts beating too slowly or the blood vessels expand, reducing blood pressure. This type of fainting spell is generally considered harmless. However, injuries can occur if a person takes a sudden fall during a fainting spell.  CAUSES  Vasovagal syncope occurs when a person's blood pressure and heart rate decrease suddenly, usually in response to a trigger. Many things and situations can trigger an episode. Some of these include:   Pain.   Fear.   The sight of blood or medical procedures, such as blood being drawn from a vein.   Common activities, such as coughing, swallowing, stretching, or going to the bathroom.   Emotional stress.   Prolonged standing, especially in a warm environment.   Lack of sleep or rest.   Prolonged lack of food.   Prolonged lack of fluids.   Recent illness.  The use of certain drugs that affect blood pressure, such as cocaine, alcohol, marijuana, inhalants, and opiates.  SYMPTOMS  Before the fainting episode, you may:   Feel dizzy or light headed.  Become pale.  Sense that you are going to faint.   Feel like the room is spinning.   Have tunnel vision, only seeing directly in front of you.   Feel sick to your stomach (nauseous).   See spots or slowly lose vision.   Hear ringing in your ears.   Have a headache.   Feel warm and sweaty.   Feel a sensation of pins and needles. During the fainting spell, you will generally be unconscious for no  longer than a couple minutes before waking up and returning to normal. If you get up too quickly before your body can recover, you may faint again. Some twitching or jerky movements may occur during the fainting spell.  DIAGNOSIS  Your caregiver will ask about your symptoms, take a medical history, and perform a physical exam. Various tests may be done to rule out other causes of fainting. These may include blood tests and tests to check the heart, such as electrocardiography, echocardiography, and possibly an electrophysiology study. When other causes have been ruled out, a test may be done to check the body's response to changes in position (tilt table test). TREATMENT  Most cases of vasovagal syncope do not require treatment. Your caregiver may recommend ways to avoid fainting triggers and may provide home strategies for preventing fainting. If you must be exposed to a possible trigger, you can drink additional fluids to help reduce your chances of having an episode of vasovagal syncope. If you have warning signs of an oncoming episode, you can respond by positioning yourself favorably (lying down). If your fainting spells continue, you may be given medicines to prevent fainting. Some medicines may help make you more resistant to repeated episodes of vasovagal syncope. Special exercises or compression stockings may be recommended. In rare cases, the surgical placement of a pacemaker is considered. HOME CARE INSTRUCTIONS   Learn to identify the warning signs of vasovagal syncope.   Sit or lie down at the first warning sign of a fainting spell. If sitting, put your head down between your legs. If you lie down, swing your legs up in the air to increase blood flow to the brain.   Avoid hot tubs and saunas.  Avoid prolonged standing.  Drink enough fluids to keep your urine clear or pale yellow. Avoid caffeine.  Increase salt in your diet as directed by your caregiver.   If you have to stand for  a long time, perform movements such as:   Crossing your legs.   Flexing and stretching your leg muscles.   Squatting.   Moving your legs.   Bending over.   Only take over-the-counter or prescription medicines as directed by your caregiver. Do not suddenly stop any medicines without asking your caregiver first. SEEK MEDICAL CARE IF:   Your fainting spells continue or happen more frequently in spite of treatment.   You lose consciousness for more than a couple minutes.  You have fainting spells during or after exercising or after being startled.   You have new symptoms that occur with the fainting spells, such as:   Shortness of breath.  Chest pain.   Irregular heartbeat.   You have episodes of twitching or jerky movements that last longer than a few seconds.  You have episodes of twitching or jerky movements without obvious fainting. SEEK IMMEDIATE MEDICAL CARE IF:   You have injuries or bleeding after a fainting spell.   You have episodes of twitching or jerky movements that last longer than  5 minutes.   You have more than one spell of twitching or jerky movements before returning to consciousness after fainting. MAKE SURE YOU:   Understand these instructions.  Will watch your condition.  Will get help right away if you are not doing well or get worse. Document Released: 11/04/2012 Document Reviewed: 11/04/2012 [facility] Patient Information 2015 [place]. This information is not intended to replace advice given to you by your health care provider. Make sure you discuss any questions you have with your health care provider.  Cryotherapy Cryotherapy is when you put ice on your injury. Ice helps lessen pain and puffiness (swelling) after an injury. Ice works the best when you start using it in the first 24 to 48 hours after an injury. HOME CARE  Put a dry or damp towel between the ice pack and your skin.  You may press gently on the ice  pack.  Leave the ice on for no more than 10 to 20 minutes at a time.  Check your skin after 5 minutes to make sure your skin is okay.  Rest at least 20 minutes between ice pack uses.  Stop using ice when your skin loses feeling (numbness).  Do not use ice on someone who cannot tell you when it hurts. This includes small children and people with memory problems (dementia). GET HELP RIGHT AWAY IF:  You have white spots on your skin.  Your skin turns blue or pale.  Your skin feels waxy or hard.  Your puffiness gets worse. MAKE SURE YOU:   Understand these instructions.  Will watch your condition.  Will get help right away if you are not doing well or get worse. Document Released: 05/06/2008 Document Revised: 02/10/2012 Document Reviewed: 07/11/2011 [facility] Patient Information 2015 [place], [place]. This information is not intended to replace advice given to you by your health care provider. Make sure you discuss any questions you have with your health care provider.

## 2014-12-05 NOTE — ED Provider Notes (Signed)
CSN: [id number]     Arrival date & time 12/05/14  1641 History   First MD Initiated Contact with Patient 12/05/14 [place]     Chief Complaint  Patient presents with  . Knee Pain     (Consider location/radiation/quality/duration/timing/severity/associated sxs/prior Treatment) HPI Comments: Lynn Roberts is a 29 y.o. female with a PMHx of a remote hx of paroxysmal atrial tachycardia in adolescence s/p ablation, chronic low back pain, obesity, PCOS, migraines, GERD, and L breast papilloma, who presents to the ED with complaints of syncopal episode that occurred around 3:40 AM after getting up quickly from the toilet after having a bowel movement and urinating. She states at that time, she felt lightheaded and nauseated directly before the syncopal episode. She fell back against the bathroom door, and came down to the floor, and believes she hit her L knee and hitting her L foot under the sink cabinet. She states she doesn't recall hitting her head, and has no head pain/HA. Awoke approx 29mins after and went to bed. She now complains of left knee pain, 9/10 located anteriorly and slightly medially, sharp, nonradiating, worse with weightbearing, and relieved with elevation. She also reports that her toes hurt from where they were hit under the sink, and she is having increased amount midline nonradiating low back pain which is similar to her chronic pain. Denies knee erythema, warmth, swelling, recent fevers, chills, CP, palpitations, SOB, abd pain, N/V/D/C, hematochezia, melena, hematuria, dysuria, vaginal bleeding/discharge, myalgias, LE swelling, weakness, paresthesias, numbness, ongoing lightheadedness, dizziness, tinnitus, vertigo, hearing loss, or rashes. She feels fine now. States she's had lightheadedness in the past, since having gastric bypass surgery on 01/28/14 (total weight loss 300#), but hasn't ever had full syncopal episode.   Patient is a 29 y.o. female presenting with knee pain and syncope.  The history is provided by the patient. No language interpreter was used.  Knee Pain Location:  Knee Time since incident:  15 hours Injury: yes   Mechanism of injury: fall   Fall:    Fall occurred:  Standing   Impact surface:  Hard floor   Point of impact:  Unable to specify   Entrapped after fall: no   Knee location:  L knee Pain details:    Quality:  Sharp   Radiates to:  Does not radiate   Severity:  Moderate (9/10)   Onset quality:  Gradual   Duration:  15 hours   Timing:  Constant   Progression:  Unchanged Chronicity:  New Dislocation: no   Prior injury to area:  No Relieved by:  Elevation Worsened by:  Bearing weight Ineffective treatments:  None tried Associated symptoms: back pain (chronic lumbar back)   Associated symptoms: no decreased ROM, no fever, no muscle weakness, no neck pain, no numbness, no stiffness, no swelling and no tingling   Risk factors: obesity   Loss of Consciousness Episode history:  Single Most recent episode:  Today Duration:  30 minutes Timing:  Rare Progression:  Resolved Chronicity:  New Context: bowel movement, standing up and urination   Witnessed: no   Relieved by:  None tried Worsened by:  Nothing tried Ineffective treatments:  None tried Associated symptoms: nausea (at 3:20 am, now resolved )   Associated symptoms: no chest pain, no confusion, no diaphoresis, no difficulty breathing, no dizziness, no fever, no focal sensory loss, no focal weakness, no headaches, no palpitations, no recent fall, no recent injury, no recent surgery, no rectal bleeding, no shortness of breath, no visual  change, no vomiting and no weakness     Past Medical History  Diagnosis Date  . Obesity   . PCOD (polycystic ovarian disease)     no menses  . Migraines   . GERD (gastroesophageal reflux disease)   . History of PAT (paroxysmal atrial tachycardia)     had cardiac ablation as a teenager  . Papilloma of breast 10/2014    left  . Complication of  anesthesia     is of [name] descent  . Arthritis of low back   . Rash of neck 10/11/2014    left   Past Surgical History  Procedure Laterality Date  . Cardiac electrophysiology study and ablation  age 68  . Cholecystectomy  04/27/2008  . Breast surgery Right 07/27/2004    retro-areolar dissection with exc. of large duct  . Breast lumpectomy Left 2011    papilloma  . Gastroplasty duodenal switch  01/2014  . Ovarian cyst removal Bilateral   . Unilateral salpingectomy    . Toenail excision    . Breast lumpectomy with radioactive seed localization Left 10/18/2014    Procedure:  RADIOACTIVE SEED GUIDED EXCISIONAL BREAST BIOPSY;  Surgeon: Alphonsa Overall, MD;  Location: [facility];  Service: General;  Laterality: Left;   History reviewed. No pertinent family history. History  Substance Use Topics  . Smoking status: Never Smoker   . Smokeless tobacco: Never Used  . Alcohol Use: No   OB History    No data available     Review of Systems  Constitutional: Negative for fever, chills and diaphoresis.  HENT: Negative for facial swelling, rhinorrhea, sinus pressure and sore throat.   Eyes: Negative for pain and visual disturbance.  Respiratory: Negative for shortness of breath.   Cardiovascular: Positive for syncope. Negative for chest pain, palpitations and leg swelling.  Gastrointestinal: Positive for nausea (at 3:20 am, now resolved ). Negative for vomiting, abdominal pain, diarrhea, constipation and blood in stool.  Genitourinary: Negative for dysuria, hematuria, flank pain, vaginal bleeding and vaginal discharge.  Musculoskeletal: Positive for back pain (chronic lumbar back) and arthralgias (L knee). Negative for myalgias, joint swelling, stiffness, neck pain and neck stiffness.  Skin: Negative for color change and rash.  Neurological: Positive for light-headedness (at 3:20am, now resolved). Negative for dizziness, focal weakness, weakness, numbness and headaches.    Hematological: Does not bruise/bleed easily.  Psychiatric/Behavioral: Negative for confusion.   10 Systems reviewed and are negative for acute change except as noted in the HPI.    Allergies  Nsaids; Other; Sulfa antibiotics; and Tramadol  Home Medications   Prior to Admission medications   Medication Sig Start Date End Date Taking? Authorizing Provider  BIOTIN PO Take 1 tablet by mouth daily.    Historical Provider, MD  cholecalciferol (VITAMIN D) 1000 UNITS tablet Take 2,000 Units by mouth daily.     Historical Provider, MD  docusate sodium (COLACE) 100 MG capsule Take 200 mg by mouth daily.     Historical Provider, MD  ferrous fumarate (HEMOCYTE - 106 MG FE) 325 (106 FE) MG TABS tablet Take 1 tablet by mouth daily.    Historical Provider, MD  flintstones complete (FLINTSTONES) 60 MG chewable tablet Chew 1 tablet by mouth daily.    Historical Provider, MD  folic acid (FOLVITE) 580 MCG tablet Take 400 mcg by mouth daily.    Historical Provider, MD  HYDROcodone-acetaminophen (NORCO/VICODIN) 5-325 MG per tablet Take 1-2 tablets by mouth every 6 (six) hours as needed. 10/18/14  Alphonsa Overall, MD  loratadine (CLARITIN) 10 MG tablet Take 10 mg by mouth daily.    Historical Provider, MD  Multiple Vitamins-Calcium (VIACTIV MULTI-VITAMIN) CHEW Chew 2 tablets by mouth 2 (two) times daily.    Historical Provider, MD  omeprazole (PRILOSEC) 20 MG capsule Take 20 mg by mouth daily.    Historical Provider, MD  zinc gluconate 50 MG tablet Take 50 mg by mouth daily.    Historical Provider, MD   BP 141/76 mmHg  Pulse 72  Temp(Src) 98.3 F (36.8 C) (Oral)  Resp 18  SpO2 100% Physical Exam  Constitutional: She is oriented to person, place, and time. Vital signs are normal. She appears well-developed and well-nourished.  Non-toxic appearance. No distress.  Afebrile, nontoxic, NAD, obese pleasant female  HENT:  Head: Normocephalic and atraumatic.  Mouth/Throat: Uvula is midline, oropharynx is clear  and moist and mucous membranes are normal.  [place]/AT, no scalp TTP or crepitus  Eyes: Conjunctivae and EOM are normal. Pupils are equal, round, and reactive to light. Right eye exhibits no discharge. Left eye exhibits no discharge.  PERRL, EOMI  Neck: Normal range of motion. Neck supple. No spinous process tenderness and no muscular tenderness present. No rigidity. Normal range of motion present.  FROM intact without spinous process or paraspinous muscle TTP, no bony stepoffs or deformities, no muscle spasms. No rigidity or meningeal signs. No bruising or swelling.   Cardiovascular: Normal rate, regular rhythm, normal heart sounds and intact distal pulses.  Exam reveals no gallop and no friction rub.   No murmur heard. Pulmonary/Chest: Effort normal and breath sounds normal. No respiratory distress. She has no decreased breath sounds. She has no wheezes. She has no rhonchi. She has no rales.  Abdominal: Soft. Normal appearance and bowel sounds are normal. She exhibits no distension. There is no tenderness. There is no rigidity, no rebound, no guarding, no CVA tenderness, no tenderness at McBurney's point and negative Murphy's sign.  Musculoskeletal: Normal range of motion.       Left knee: She exhibits normal range of motion, no swelling, no effusion, normal alignment, no LCL laxity, normal patellar mobility, no bony tenderness and no MCL laxity. Tenderness found. Medial joint line and lateral joint line tenderness noted.       Lumbar back: She exhibits tenderness. She exhibits normal range of motion and no spasm.       Back:       Legs: Lumbar spine with FROM intact with mild midline spinous process TTP which pt states is chronic, no bony stepoffs or deformities, no muscle spasms or TTP.  L knee with mild joint line TTP both medially and laterally, with FROM intact, no swelling/effusion/deformity, no bruising, no abnormal alignment or patellar mobility, no varus/valgus laxity, neg anterior drawer  test, no crepitus.  Gait steady although pt favors R leg due to L knee pain Strength 5/5 in all extremities Sensation grossly intact in all extremities Distal pulses equal No pedal edema  Neurological: She is alert and oriented to person, place, and time. She has normal strength and normal reflexes. No cranial nerve deficit or sensory deficit. She displays a negative Romberg sign. Coordination and gait normal. GCS eye subscore is 4. GCS verbal subscore is 5. GCS motor subscore is 6.  CN 2-12 grossly intact A&O x4 GCS 15 DTRs equal Sensation and strength intact Gait nonataxic including with tandem walking Coordination with finger-to-nose WNL Neg romberg, neg pronator drift   Skin: Skin is warm, dry and intact.  No abrasion, no bruising and no rash noted.  No bruising or abrasions over exposed surfaces  Psychiatric: She has a normal mood and affect.  Nursing note and vitals reviewed.   ED Course  Procedures (including critical care time) 19:57 Orthostatic Vital Signs CG  Orthostatic Lying  - BP- Lying: 106/57 mmHg ; Pulse- Lying: 57  Orthostatic Sitting - BP- Sitting: 118/72 mmHg ; Pulse- Sitting: 65  Orthostatic Standing at 0 minutes - BP- Standing at 0 minutes: 143/94 mmHg ; Pulse- Standing at 0 minutes: 90    Labs Review Labs Reviewed  URINALYSIS, ROUTINE W REFLEX MICROSCOPIC - Abnormal; Notable for the following:    APPearance HAZY (*)    Bilirubin Urine SMALL (*)    All other components within normal limits  CBC  BASIC METABOLIC PANEL  POC URINE PREG, ED    Imaging Review Dg Knee Complete 4 Views Left  12/05/2014   CLINICAL DATA:  Initial evaluation for left knee pain, patient passed out and fell in bathroom this morning  EXAM: LEFT KNEE - COMPLETE 4+ VIEW  COMPARISON:  None.  FINDINGS: Mild arthritis of the patellofemoral joint with no evidence of the fusion. No evidence of fracture or dislocation. Irregularity of anterior tibial tubercle with no evidence of acute process.  Mild degenerative change of the medial compartment.  IMPRESSION: No acute findings   Electronically Signed   By: Skipper Cliche M.D.   On: 12/05/2014 19:31     EKG Interpretation   Date/Time:  Monday December 05 2014 20:01:04 EST Ventricular Rate:  66 PR Interval:    QRS Duration: 89 QT Interval:  372 QTC Calculation: 390 R Axis:   71 Text Interpretation:  Atrial flutter Ventricular premature complex Low  voltage, precordial leads Sinus rhythm Artifact Abnormal ekg Confirmed by  Carmin Muskrat  MD ([id number]) on 12/05/2014 8:44:26 PM      MDM   Final diagnoses:  Left knee pain  Syncope, vasovagal  Chronic low back pain  Fall, initial encounter    29 y.o. female with syncopal episode last night after urination and having a BM, now with L knee pain. Neurovascularly intact with soft compartments, ambulatory but favors R leg due to L knee pain. No red flag s/s of low back pain. No s/s of central cord compression or cauda equina. Will obtain labs, EKG, and Upreg to evaluate syncopal episode although it sounds like it was probably due to micturition/vasovagal from orthostasis. Will get xray of L knee, but doubt need for imaging of back. Will give vicodin here.   9:11 PM Orthostatics positive, which could be the source of her syncope last night, encouraged PO fluids since no IV started. Labs all unremarkable, EKG unremarkable, Knee xray unremarkable for acute change. Pain mildly improved, will give another vicodin (pt can't get NSAIDs due to gastric bypass). Discussed RICE therapy and f/up with ortho in 2 wks for ongoing pain. Given knee sleeve and crutches, and rx for pain meds at home. Discussed staying well hydrated and PCP f/up in 1 wk for recheck. I explained the diagnosis and have given explicit precautions to return to the ER including for any other new or worsening symptoms. The patient understands and accepts the medical plan as it's been dictated and I have answered their questions.  Discharge instructions concerning home care and prescriptions have been given. The patient is STABLE and is discharged to home in good condition.  BP 106/57 mmHg  Pulse 60  Temp(Src) 98.3 F (36.8 C) (  Oral)  Resp 19  SpO2 98%  Meds ordered this encounter  Medications  . HYDROcodone-acetaminophen (NORCO/VICODIN) 5-325 MG per tablet 1 tablet    Sig:   . HYDROcodone-acetaminophen (NORCO/VICODIN) 5-325 MG per tablet 1 tablet    Sig:   . HYDROcodone-acetaminophen (NORCO) 5-325 MG per tablet    Sig: Take 1-2 tablets by mouth every 6 (six) hours as needed for severe pain.    Dispense:  10 tablet    Refill:  0    Order Specific Question:  Supervising Provider    Answer:  Johnna Acosta [address] Camprubi-Soms, PA-C 12/05/14 2114  Carmin Muskrat, MD 12/05/14 2326

## 2015-01-02 ENCOUNTER — Emergency Department: Payer: Self-pay | Admitting: Emergency Medicine

## 2015-12-07 ENCOUNTER — Encounter (HOSPITAL_COMMUNITY): Payer: Self-pay | Admitting: Emergency Medicine

## 2015-12-07 ENCOUNTER — Emergency Department (HOSPITAL_COMMUNITY)
Admission: EM | Admit: 2015-12-07 | Discharge: 2015-12-07 | Disposition: A | Discharge status: Home / Self Care | Payer: No Typology Code available for payment source | Attending: Emergency Medicine | Admitting: Emergency Medicine

## 2015-12-07 DIAGNOSIS — Z8679 Personal history of other diseases of the circulatory system: Secondary | ICD-10-CM | POA: Insufficient documentation

## 2015-12-07 DIAGNOSIS — R55 Syncope and collapse: Secondary | ICD-10-CM | POA: Insufficient documentation

## 2015-12-07 DIAGNOSIS — E669 Obesity, unspecified: Secondary | ICD-10-CM | POA: Insufficient documentation

## 2015-12-07 DIAGNOSIS — M47896 Other spondylosis, lumbar region: Secondary | ICD-10-CM | POA: Insufficient documentation

## 2015-12-07 DIAGNOSIS — Z8719 Personal history of other diseases of the digestive system: Secondary | ICD-10-CM | POA: Insufficient documentation

## 2015-12-07 LAB — CBC
HCT: 40.1 % (ref 36.0–46.0)
Hemoglobin: 12.9 g/dL (ref 12.0–15.0)
MCH: 26.9 pg (ref 26.0–34.0)
MCHC: 32.2 g/dL (ref 30.0–36.0)
MCV: 83.7 fL (ref 78.0–100.0)
Platelets: 197 10*3/uL (ref 150–400)
RBC: 4.79 MIL/uL (ref 3.87–5.11)
RDW: 14.5 % (ref 11.5–15.5)
WBC: 10.4 10*3/uL (ref 4.0–10.5)

## 2015-12-07 LAB — URINALYSIS, ROUTINE W REFLEX MICROSCOPIC
Bilirubin Urine: NEGATIVE
Glucose, UA: NEGATIVE mg/dL
Hgb urine dipstick: NEGATIVE
Ketones, ur: NEGATIVE mg/dL
Leukocytes, UA: NEGATIVE
Nitrite: NEGATIVE
Protein, ur: NEGATIVE mg/dL
Specific Gravity, Urine: 1.012 (ref 1.005–1.030)
pH: 5.5 (ref 5.0–8.0)

## 2015-12-07 LAB — CBG MONITORING, ED
Glucose-Capillary: 74 mg/dL (ref 65–99)
Glucose-Capillary: 73 mg/dL (ref 65–99)
Glucose-Capillary: 87 mg/dL (ref 65–99)

## 2015-12-07 LAB — BASIC METABOLIC PANEL WITH GFR
Anion gap: 7 (ref 5–15)
BUN: 10 mg/dL (ref 6–20)
CO2: 25 mmol/L (ref 22–32)
Calcium: 8.7 mg/dL — ABNORMAL LOW (ref 8.9–10.3)
Chloride: 106 mmol/L (ref 101–111)
Creatinine, Ser: 0.59 mg/dL (ref 0.44–1.00)
GFR calc Af Amer: >60 mL/min
GFR calc non Af Amer: >60 mL/min
Glucose, Bld: 102 mg/dL — ABNORMAL HIGH (ref 65–99)
Potassium: 3.4 mmol/L — ABNORMAL LOW (ref 3.5–5.1)
Sodium: 138 mmol/L (ref 135–145)

## 2015-12-07 MED ORDER — ACETAMINOPHEN 325 MG PO TABS
650.0000 mg | ORAL_TABLET | Freq: Once | ORAL | Status: AC
  Administered 2015-12-07: 650 mg via ORAL
  Filled 2015-12-07: qty 2

## 2015-12-07 NOTE — ED Provider Notes (Signed)
CSN: [id number]     Arrival date & time 12/07/15  0920 History   First MD Initiated Contact with Patient 12/07/15 1024     Chief Complaint  Patient presents with  . hypoglycemia/syncope       The history is provided by the patient.   Presents with complaint of syncope while at work today. Felt warm and flush. Was concerned that her blood sugar was low. No preceding CP or SOB. No compalaints at this time. No abd pain or vomiting. Nothing worsened or improved her symptoms. No tongue biting   Past Medical History  Diagnosis Date  . Obesity   . PCOD (polycystic ovarian disease)     no menses  . Migraines   . GERD (gastroesophageal reflux disease)   . History of PAT (paroxysmal atrial tachycardia)     had cardiac ablation as a teenager  . Papilloma of breast 10/2014    left  . Complication of anesthesia     is of [name] descent  . Arthritis of low back   . Rash of neck 10/11/2014    left   Past Surgical History  Procedure Laterality Date  . Cardiac electrophysiology study and ablation  age 29  . Cholecystectomy  04/27/2008  . Breast surgery Right 07/27/2004    retro-areolar dissection with exc. of large duct  . Breast lumpectomy Left 2011    papilloma  . Gastroplasty duodenal switch  01/2014  . Ovarian cyst removal Bilateral   . Unilateral salpingectomy    . Toenail excision    . Breast lumpectomy with radioactive seed localization Left 10/18/2014    Procedure:  RADIOACTIVE SEED GUIDED EXCISIONAL BREAST BIOPSY;  Surgeon: Alphonsa Overall, MD;  Location: [facility];  Service: General;  Laterality: Left;   No family history on file. Social History  Substance Use Topics  . Smoking status: Never Smoker   . Smokeless tobacco: Never Used  . Alcohol Use: No   OB History    No data available     Review of Systems  All other systems reviewed and are negative.     Allergies  Nsaids; Other; Sulfa antibiotics; and Tramadol  Home Medications   Prior to  Admission medications   Medication Sig Start Date End Date Taking? Authorizing Provider  glucose 4 GM chewable tablet Chew 1 tablet by mouth once as needed for low blood sugar.   Yes Historical Provider, MD  traMADol (ULTRAM) 50 MG tablet Take 50 mg by mouth every 6 (six) hours as needed for moderate pain or severe pain.   Yes Historical Provider, MD  traZODone (DESYREL) 50 MG tablet Take 150 mg by mouth at bedtime as needed for sleep.   Yes Historical Provider, MD  HYDROcodone-acetaminophen (NORCO) 5-325 MG per tablet Take 1-2 tablets by mouth every 6 (six) hours as needed for severe pain. Patient not taking: Reported on 12/07/2015 12/05/14   Mercedes Camprubi-Soms, PA-C  HYDROcodone-acetaminophen (NORCO/VICODIN) 5-325 MG per tablet Take 1-2 tablets by mouth every 6 (six) hours as needed. Patient not taking: Reported on 12/07/2015 10/18/14   Alphonsa Overall, MD   BP 120/76 mmHg  Pulse 78  Temp(Src) 98 F (36.7 C) (Oral)  Resp 20  SpO2 97% Physical Exam  Constitutional: She is oriented to person, place, and time. She appears well-developed and well-nourished. No distress.  HENT:  Head: Normocephalic and atraumatic.  Eyes: EOM are normal.  Neck: Normal range of motion.  Cardiovascular: Normal rate, regular rhythm and normal heart sounds.  Pulmonary/Chest: Effort normal and breath sounds normal.  Abdominal: Soft. She exhibits no distension. There is no tenderness.  Musculoskeletal: Normal range of motion.  Neurological: She is alert and oriented to person, place, and time.  Skin: Skin is warm and dry.  Psychiatric: She has a normal mood and affect. Judgment normal.  Nursing note and vitals reviewed.   ED Course  Procedures (including critical care time) Labs Review Labs Reviewed  BASIC METABOLIC PANEL - Abnormal; Notable for the following:    Potassium 3.4 (*)    Glucose, Bld 102 (*)    Calcium 8.7 (*)    All other components within normal limits  CBC  URINALYSIS, ROUTINE W REFLEX  MICROSCOPIC (NOT AT [facility])  CBG MONITORING, ED  CBG MONITORING, ED  CBG MONITORING, ED    Imaging Review No results found. I have personally reviewed and evaluated these images and lab results as part of my medical decision-making.   EKG Interpretation   Date/Time:  Thursday December 07 2015 11:06:21 EST Ventricular Rate:  79 PR Interval:  172 QRS Duration: 96 QT Interval:  353 QTC Calculation: 405 R Axis:   71 Text Interpretation:  Sinus rhythm No old tracing to compare Confirmed by  Bryanah Sidell  MD, Lennette Bihari ([id number]) on 12/07/2015 2:06:45 PM      MDM   Final diagnoses:  Syncope, unspecified syncope type    Patient continues to feel well at this time.  EKG without abnormalities.  Labs without significant abnormalities.  No hypoglycemia noted.  Patient be referred back to her primary care physician as well as her endocrinologist.  Discharge home in good condition.  Patient understands to return to the ER for new or worsening symptoms   Jola Schmidt, MD 12/07/15 1627

## 2015-12-07 NOTE — ED Notes (Signed)
Family at bedside.  Pt resting and appears comfortable using cellphone.  On monitor and will continue to monitor.

## 2015-12-07 NOTE — ED Notes (Signed)
Aware we need urine, unable to go at this time

## 2015-12-07 NOTE — ED Notes (Signed)
Per pt, states she thought her sugar was low last night-couldn't check it because batteries where dead-states she was at work and passed out-did not hit head

## 2015-12-07 NOTE — ED Notes (Signed)
Bed: WA21 Expected date:  Expected time:  Means of arrival:  Comments: Hold for triage 2

## 2016-02-02 ENCOUNTER — Encounter: Payer: Self-pay | Admitting: Obstetrics and Gynecology

## 2016-02-02 ENCOUNTER — Ambulatory Visit (INDEPENDENT_AMBULATORY_CARE_PROVIDER_SITE_OTHER): Payer: Self-pay | Admitting: Obstetrics and Gynecology

## 2016-02-02 VITALS — BP 118/76 | Ht 71.0 in | Wt 252.0 lb

## 2016-02-02 DIAGNOSIS — Z30432 Encounter for removal of intrauterine contraceptive device: Secondary | ICD-10-CM

## 2016-02-02 NOTE — Progress Notes (Signed)
Patient ID: Lynn Roberts, female   DOB: 02-22-1986, 30 y.o.   MRN: [id number] Pt here today for IUD removal. Pt wants to discuss getting pregnant.

## 2016-02-02 NOTE — Progress Notes (Signed)
[facility] PROCEDURE NOTE  Lynn Roberts is a 30 y.o. No obstetric history on file. here for Mirena IUD removal. No GYN concerns.  Last pap smear was normal. Pt has had the Mirena IUD for the past two years. Pt now wants to discuss attempting pregnancy following the IUD removal. Pt had gastric bypass surgery completed at [facility] and her lab work has been reported as normal. Pt takes flintstone multi-vitamin and folic acid following her surgery. Pt wants to know how her PMHx of PCOS will affect her chance of pregnancy. Denies any other symptoms.   IUD Removal  Patient was in the dorsal lithotomy position, normal external genitalia was noted.  A speculum was placed in the patient's vagina, normal discharge was noted, no lesions. The multiparous cervix was visualized, no lesions, no abnormal discharge;  and the cervix was swabbed with Betadine using scopettes. The strings of the IUD were grasped and pulled using ring forceps.  The IUD was successfully removed in its entirety.    Patient tolerated the procedure well.    Future contraception: N/A, pt is wanting pregnancy at this time.   A: 1. IUD removal  P: 1. Began prenatal vitamins.      2. Use [email or web address] to aid in fertility.       3. Follow up PRN  By signing my name below, I, Soijett Blue, attest that this documentation has been prepared under the direction and in the presence of Jonnie Kind, MD. Electronically Signed: Soijett Blue, ED Scribe. 02/02/2016. 11:34 AM.  I personally performed the services described in this documentation, which was SCRIBED in my presence. The recorded information has been reviewed and considered accurate. It has been edited as necessary during review. Jonnie Kind, MD

## 2016-03-20 ENCOUNTER — Telehealth: Payer: Self-pay | Admitting: Obstetrics and Gynecology

## 2016-03-20 NOTE — Telephone Encounter (Signed)
Pt states that she had her IUD removed the first part of March and she still has not started a period. Pt states that she has been having unprotected sex and has taken a pregnancy test and it was negative. Pt wants to know if there is something that she could be given to help her start a period so they can try to get pregnant.

## 2016-03-21 NOTE — Telephone Encounter (Signed)
appt advised to discuss fertility plans further

## 2016-03-21 NOTE — Telephone Encounter (Signed)
Pt aware that Dr. Glo Herring recommended her have an appointment to discuss options. Pt states that she has no insurance and that she was hoping he could just call something in for her. I spoke with our insurance and billing girls and they advised the appointment would cost the pt [phone]. I advised the pt of this and she verbalized understanding. Dr. Glo Herring also advised that if the pt wanted to wait another month and give her body a little more time to regulate itself that she could do that and call us back when she is ready for the appointment. I advised the pt of this and she opted to do this instead of an appointment at this time. Pt will call our office back when she is ready to come in.

## 2016-03-26 ENCOUNTER — Emergency Department (HOSPITAL_COMMUNITY)
Admission: EM | Admit: 2016-03-26 | Discharge: 2016-03-26 | Disposition: A | Discharge status: Home / Self Care | Payer: Medicaid Other | Attending: Emergency Medicine | Admitting: Emergency Medicine

## 2016-03-26 ENCOUNTER — Encounter (HOSPITAL_COMMUNITY): Payer: Self-pay | Admitting: Emergency Medicine

## 2016-03-26 DIAGNOSIS — Z79899 Other long term (current) drug therapy: Secondary | ICD-10-CM | POA: Insufficient documentation

## 2016-03-26 DIAGNOSIS — Z3490 Encounter for supervision of normal pregnancy, unspecified, unspecified trimester: Secondary | ICD-10-CM

## 2016-03-26 DIAGNOSIS — Z3A01 Less than 8 weeks gestation of pregnancy: Secondary | ICD-10-CM | POA: Diagnosis not present

## 2016-03-26 DIAGNOSIS — R42 Dizziness and giddiness: Secondary | ICD-10-CM | POA: Diagnosis not present

## 2016-03-26 DIAGNOSIS — O26891 Other specified pregnancy related conditions, first trimester: Secondary | ICD-10-CM | POA: Diagnosis not present

## 2016-03-26 DIAGNOSIS — R110 Nausea: Secondary | ICD-10-CM | POA: Diagnosis not present

## 2016-03-26 DIAGNOSIS — M47819 Spondylosis without myelopathy or radiculopathy, site unspecified: Secondary | ICD-10-CM | POA: Insufficient documentation

## 2016-03-26 DIAGNOSIS — F17210 Nicotine dependence, cigarettes, uncomplicated: Secondary | ICD-10-CM | POA: Diagnosis not present

## 2016-03-26 DIAGNOSIS — E669 Obesity, unspecified: Secondary | ICD-10-CM | POA: Insufficient documentation

## 2016-03-26 LAB — COMPREHENSIVE METABOLIC PANEL
ALT: 12 U/L — ABNORMAL LOW (ref 14–54)
AST: 12 U/L — ABNORMAL LOW (ref 15–41)
Albumin: 3.5 g/dL (ref 3.5–5.0)
Alkaline Phosphatase: 49 U/L (ref 38–126)
Anion gap: 5 (ref 5–15)
BUN: 9 mg/dL (ref 6–20)
CO2: 26 mmol/L (ref 22–32)
Calcium: 8.6 mg/dL — ABNORMAL LOW (ref 8.9–10.3)
Chloride: 109 mmol/L (ref 101–111)
Creatinine, Ser: 0.53 mg/dL (ref 0.44–1.00)
GFR calc Af Amer: >60 mL/min (ref >60)
GFR calc non Af Amer: >60 mL/min (ref >60)
Glucose, Bld: 90 mg/dL (ref 65–99)
Potassium: 4 mmol/L (ref 3.5–5.1)
Sodium: 140 mmol/L (ref 135–145)
Total Bilirubin: 0.3 mg/dL (ref 0.3–1.2)
Total Protein: 6.5 g/dL (ref 6.5–8.1)

## 2016-03-26 LAB — URINALYSIS, ROUTINE W REFLEX MICROSCOPIC
Bilirubin Urine: NEGATIVE
Glucose, UA: NEGATIVE mg/dL
Hgb urine dipstick: NEGATIVE
Ketones, ur: NEGATIVE mg/dL
Leukocytes, UA: NEGATIVE
Nitrite: NEGATIVE
Protein, ur: NEGATIVE mg/dL
Specific Gravity, Urine: 1.015 (ref 1.005–1.030)
pH: 6.5 (ref 5.0–8.0)

## 2016-03-26 LAB — CBC
HCT: 37.9 % (ref 36.0–46.0)
Hemoglobin: 12.5 g/dL (ref 12.0–15.0)
MCH: 26.8 pg (ref 26.0–34.0)
MCHC: 33 g/dL (ref 30.0–36.0)
MCV: 81.2 fL (ref 78.0–100.0)
Platelets: 263 10*3/uL (ref 150–400)
RBC: 4.67 MIL/uL (ref 3.87–5.11)
RDW: 14.4 % (ref 11.5–15.5)
WBC: 7.2 10*3/uL (ref 4.0–10.5)

## 2016-03-26 LAB — I-STAT BETA HCG BLOOD, ED (MC, WL, AP ONLY): I-stat hCG, quantitative: >2000 m[IU]/mL — ABNORMAL HIGH (ref <5)

## 2016-03-26 LAB — PREGNANCY, URINE: Preg Test, Ur: POSITIVE — AB

## 2016-03-26 MED ORDER — ONDANSETRON 4 MG PO TBDP
4.0000 mg | ORAL_TABLET | Freq: Once | ORAL | Status: AC
  Administered 2016-03-26: 4 mg via ORAL
  Filled 2016-03-26: qty 1

## 2016-03-26 NOTE — Discharge Instructions (Signed)
Follow up with dr. Elonda Husky for your pregnancy

## 2016-03-26 NOTE — ED Provider Notes (Signed)
CSN: [id number]     Arrival date & time 03/26/16  1406 History   First MD Initiated Contact with Patient 03/26/16 1417     Chief Complaint  Patient presents with  . Nausea  . Dizziness     (Consider location/radiation/quality/duration/timing/severity/associated sxs/prior Treatment) Patient is a 30 y.o. female presenting with weakness. The history is provided by the patient (Patient complains of nausea for a number days. No fever no chills some weakness).  Weakness This is a new problem. The current episode started more than 2 days ago. The problem occurs daily. The problem has not changed since onset.Pertinent negatives include no chest pain, no abdominal pain and no headaches. Nothing aggravates the symptoms. Nothing relieves the symptoms.    Past Medical History  Diagnosis Date  . Obesity   . PCOD (polycystic ovarian disease)     no menses  . Migraines   . GERD (gastroesophageal reflux disease)   . History of PAT (paroxysmal atrial tachycardia)     had cardiac ablation as a teenager  . Papilloma of breast 10/2014    left  . Complication of anesthesia     is of [name] descent  . Arthritis of low back   . Rash of neck 10/11/2014    left   Past Surgical History  Procedure Laterality Date  . Cardiac electrophysiology study and ablation  age 22  . Cholecystectomy  04/27/2008  . Breast surgery Right 07/27/2004    retro-areolar dissection with exc. of large duct  . Breast lumpectomy Left 2011    papilloma  . Gastroplasty duodenal switch  01/2014  . Ovarian cyst removal Bilateral   . Unilateral salpingectomy    . Toenail excision    . Breast lumpectomy with radioactive seed localization Left 10/18/2014    Procedure:  RADIOACTIVE SEED GUIDED EXCISIONAL BREAST BIOPSY;  Surgeon: Alphonsa Overall, MD;  Location: [facility];  Service: General;  Laterality: Left;   Family History  Problem Relation Age of Onset  . Cancer Paternal Grandfather   . Cancer Maternal  Grandmother   . Diabetes Maternal Grandfather   . Hypertension Father   . Hyperlipidemia Father   . Hypertension Mother   . Diabetes Mother    Social History  Substance Use Topics  . Smoking status: Current Every Day Smoker -- 1.00 packs/day for 10 years    Types: Cigarettes  . Smokeless tobacco: Never Used  . Alcohol Use: Yes     Comment: occ.   OB History    No data available     Review of Systems  Constitutional: Negative for appetite change and fatigue.  HENT: Negative for congestion, ear discharge and sinus pressure.   Eyes: Negative for discharge.  Respiratory: Negative for cough.   Cardiovascular: Negative for chest pain.  Gastrointestinal: Positive for nausea. Negative for abdominal pain and diarrhea.  Genitourinary: Negative for frequency and hematuria.  Musculoskeletal: Negative for back pain.  Skin: Negative for rash.  Neurological: Positive for weakness. Negative for seizures and headaches.  Psychiatric/Behavioral: Negative for hallucinations.      Allergies  Nsaids; Other; Sulfa antibiotics; and Tramadol  Home Medications   Prior to Admission medications   Medication Sig Start Date End Date Taking? Authorizing Provider  glucose 4 GM chewable tablet Chew 1 tablet by mouth once as needed for low blood sugar.   Yes Historical Provider, MD  traMADol (ULTRAM) 50 MG tablet Take 50 mg by mouth every 6 (six) hours as needed for moderate pain or  severe pain.   Yes Historical Provider, MD  traZODone (DESYREL) 50 MG tablet Take 150 mg by mouth at bedtime as needed for sleep.   Yes Historical Provider, MD   BP 124/68 mmHg  Pulse 82  Temp(Src) 98.2 F (36.8 C) (Oral)  Resp 18  Ht 5\' 11"  (1.803 m)  Wt 245 lb (111.131 kg)  BMI 34.19 kg/m2  SpO2 100%  LMP 02/02/2016 Physical Exam  Constitutional: She is oriented to person, place, and time. She appears well-developed.  HENT:  Head: Normocephalic.  Eyes: Conjunctivae and EOM are normal. No scleral icterus.   Neck: Neck supple. No thyromegaly present.  Cardiovascular: Normal rate and regular rhythm.  Exam reveals no gallop and no friction rub.   No murmur heard. Pulmonary/Chest: No stridor. She has no wheezes. She has no rales. She exhibits no tenderness.  Abdominal: She exhibits no distension. There is no tenderness. There is no rebound.  Musculoskeletal: Normal range of motion. She exhibits no edema.  Lymphadenopathy:    She has no cervical adenopathy.  Neurological: She is oriented to person, place, and time. She exhibits normal muscle tone. Coordination normal.  Skin: No rash noted. No erythema.  Psychiatric: She has a normal mood and affect. Her behavior is normal.    ED Course  Procedures (including critical care time) Labs Review Labs Reviewed  COMPREHENSIVE METABOLIC PANEL - Abnormal; Notable for the following:    Calcium 8.6 (*)    AST 12 (*)    ALT 12 (*)    All other components within normal limits  PREGNANCY, URINE - Abnormal; Notable for the following:    Preg Test, Ur POSITIVE (*)    All other components within normal limits  I-STAT BETA HCG BLOOD, ED ([facility], [facility], [facility] ONLY) - Abnormal; Notable for the following:    I-stat hCG, quantitative >2000.0 (*)    All other components within normal limits  CBC  URINALYSIS, ROUTINE W REFLEX MICROSCOPIC (NOT AT [facility])    Imaging Review No results found. I have personally reviewed and evaluated these images and lab results as part of my medical decision-making.   EKG Interpretation None      MDM   Final diagnoses:  Pregnancy    Labs unremarkable except for positive pregnancy test. Patient was informed of her pregnancy and she is going to follow-up with OB/GYN. I suspect nausea is related to the pregnancy    Milton Ferguson, MD 03/26/16 [phone]

## 2016-03-26 NOTE — ED Notes (Signed)
Pt states she was at work and suddenly felt dizzy with accompanying nausea.  Pt states that she checked her blood glucose and it was 80.  After resting for a bit, pt stated she felt the same and decided to go home.  Pt denies injury.

## 2016-03-29 ENCOUNTER — Other Ambulatory Visit: Payer: Self-pay | Admitting: Obstetrics and Gynecology

## 2016-03-29 DIAGNOSIS — O3680X0 Pregnancy with inconclusive fetal viability, not applicable or unspecified: Secondary | ICD-10-CM

## 2016-04-01 ENCOUNTER — Other Ambulatory Visit: Payer: Self-pay | Admitting: Obstetrics and Gynecology

## 2016-04-01 ENCOUNTER — Ambulatory Visit: Payer: Self-pay | Admitting: Adult Health

## 2016-04-01 ENCOUNTER — Ambulatory Visit (INDEPENDENT_AMBULATORY_CARE_PROVIDER_SITE_OTHER): Payer: Medicaid Other

## 2016-04-01 DIAGNOSIS — Z3A01 Less than 8 weeks gestation of pregnancy: Secondary | ICD-10-CM | POA: Diagnosis not present

## 2016-04-01 DIAGNOSIS — O3680X0 Pregnancy with inconclusive fetal viability, not applicable or unspecified: Secondary | ICD-10-CM

## 2016-04-01 NOTE — Progress Notes (Signed)
[place] TA/TV 6 wks,single IUP w/YS, pos fht 96 bpm,bilat enlarged ov's (hx of PCOS),crl 4.1 mm

## 2016-04-03 ENCOUNTER — Telehealth: Payer: Self-pay | Admitting: *Deleted

## 2016-04-03 NOTE — Telephone Encounter (Signed)
Pt's BF called stating pt is [redacted]w[redacted]d and is having a lot of lower abd pressure X 2 hours, denies any VB.  States pt has a scheduled appointment here on 5/9 for new OB.  Advised to take Tylenol, rest and push fluids, if develops severe cramping or heavy bleeding call us back or go to [facility].  Pt's BF verbalized understanding.

## 2016-04-09 ENCOUNTER — Other Ambulatory Visit (HOSPITAL_COMMUNITY)
Admission: RE | Admit: 2016-04-09 | Discharge: 2016-04-09 | Disposition: A | Discharge status: Home / Self Care | Payer: Self-pay | Source: Ambulatory Visit | Attending: Adult Health | Admitting: Adult Health

## 2016-04-09 ENCOUNTER — Other Ambulatory Visit (INDEPENDENT_AMBULATORY_CARE_PROVIDER_SITE_OTHER): Payer: Medicaid Other

## 2016-04-09 ENCOUNTER — Encounter: Payer: Self-pay | Admitting: Adult Health

## 2016-04-09 ENCOUNTER — Other Ambulatory Visit: Payer: Self-pay | Admitting: Adult Health

## 2016-04-09 ENCOUNTER — Ambulatory Visit (INDEPENDENT_AMBULATORY_CARE_PROVIDER_SITE_OTHER): Payer: Medicaid Other | Admitting: Adult Health

## 2016-04-09 VITALS — BP 114/64 | HR 60 | Wt 243.0 lb

## 2016-04-09 DIAGNOSIS — Z124 Encounter for screening for malignant neoplasm of cervix: Secondary | ICD-10-CM

## 2016-04-09 DIAGNOSIS — Z331 Pregnant state, incidental: Secondary | ICD-10-CM

## 2016-04-09 DIAGNOSIS — Z3A08 8 weeks gestation of pregnancy: Secondary | ICD-10-CM

## 2016-04-09 DIAGNOSIS — O3680X0 Pregnancy with inconclusive fetal viability, not applicable or unspecified: Secondary | ICD-10-CM | POA: Diagnosis not present

## 2016-04-09 DIAGNOSIS — O039 Complete or unspecified spontaneous abortion without complication: Secondary | ICD-10-CM

## 2016-04-09 DIAGNOSIS — Z0283 Encounter for blood-alcohol and blood-drug test: Secondary | ICD-10-CM

## 2016-04-09 DIAGNOSIS — Z369 Encounter for antenatal screening, unspecified: Secondary | ICD-10-CM

## 2016-04-09 DIAGNOSIS — Z1389 Encounter for screening for other disorder: Secondary | ICD-10-CM | POA: Diagnosis not present

## 2016-04-09 DIAGNOSIS — Z01419 Encounter for gynecological examination (general) (routine) without abnormal findings: Secondary | ICD-10-CM | POA: Insufficient documentation

## 2016-04-09 DIAGNOSIS — Z1151 Encounter for screening for human papillomavirus (HPV): Secondary | ICD-10-CM | POA: Insufficient documentation

## 2016-04-09 DIAGNOSIS — Z113 Encounter for screening for infections with a predominantly sexual mode of transmission: Secondary | ICD-10-CM | POA: Insufficient documentation

## 2016-04-09 DIAGNOSIS — Z3491 Encounter for supervision of normal pregnancy, unspecified, first trimester: Secondary | ICD-10-CM

## 2016-04-09 DIAGNOSIS — Z3490 Encounter for supervision of normal pregnancy, unspecified, unspecified trimester: Secondary | ICD-10-CM | POA: Insufficient documentation

## 2016-04-09 HISTORY — DX: Encounter for supervision of normal pregnancy, unspecified, unspecified trimester: Z34.90

## 2016-04-09 LAB — POCT URINALYSIS DIPSTICK
Blood, UA: NEGATIVE
Glucose, UA: NEGATIVE
Ketones, UA: NEGATIVE
Leukocytes, UA: NEGATIVE
Nitrite, UA: NEGATIVE
Protein, UA: NEGATIVE

## 2016-04-09 NOTE — Patient Instructions (Signed)
Miscarriage A miscarriage is the sudden loss of an unborn baby (fetus) before the 20th week of pregnancy. Most miscarriages happen in the first 3 months of pregnancy. Sometimes, it happens before a woman even knows she is pregnant. A miscarriage is also called a "spontaneous miscarriage" or "early pregnancy loss." Having a miscarriage can be an emotional experience. Talk with your caregiver about any questions you may have about miscarrying, the grieving process, and your future pregnancy plans. CAUSES   Problems with the fetal chromosomes that make it impossible for the baby to develop normally. Problems with the baby's genes or chromosomes are most often the result of errors that occur, by chance, as the embryo divides and grows. The problems are not inherited from the parents.  Infection of the cervix or uterus.   Hormone problems.   Problems with the cervix, such as having an incompetent cervix. This is when the tissue in the cervix is not strong enough to hold the pregnancy.   Problems with the uterus, such as an abnormally shaped uterus, uterine fibroids, or congenital abnormalities.   Certain medical conditions.   Smoking, drinking alcohol, or taking illegal drugs.   Trauma.  Often, the cause of a miscarriage is unknown.  SYMPTOMS   Vaginal bleeding or spotting, with or without cramps or pain.  Pain or cramping in the abdomen or lower back.  Passing fluid, tissue, or blood clots from the vagina. DIAGNOSIS  Your caregiver will perform a physical exam. You may also have an ultrasound to confirm the miscarriage. Blood or urine tests may also be ordered. TREATMENT   Sometimes, treatment is not necessary if you naturally pass all the fetal tissue that was in the uterus. If some of the fetus or placenta remains in the body (incomplete miscarriage), tissue left behind may become infected and must be removed. Usually, a dilation and curettage (D and C) procedure is performed.  During a D and C procedure, the cervix is widened (dilated) and any remaining fetal or placental tissue is gently removed from the uterus.  Antibiotic medicines are prescribed if there is an infection. Other medicines may be given to reduce the size of the uterus (contract) if there is a lot of bleeding.  If you have Rh negative blood and your baby was Rh positive, you will need a Rh immunoglobulin shot. This shot will protect any future baby from having Rh blood problems in future pregnancies. HOME CARE INSTRUCTIONS   Your caregiver may order bed rest or may allow you to continue light activity. Resume activity as directed by your caregiver.  Have someone help with home and family responsibilities during this time.   Keep track of the number of sanitary pads you use each day and how soaked (saturated) they are. Write down this information.   Do not use tampons. Do not douche or have sexual intercourse until approved by your caregiver.   Only take over-the-counter or prescription medicines for pain or discomfort as directed by your caregiver.   Do not take aspirin. Aspirin can cause bleeding.   Keep all follow-up appointments with your caregiver.   If you or your partner have problems with grieving, talk to your caregiver or seek counseling to help cope with the pregnancy loss. Allow enough time to grieve before trying to get pregnant again.  SEEK IMMEDIATE MEDICAL CARE IF:   You have severe cramps or pain in your back or abdomen.  You have a fever.  You pass large blood clots (walnut-sized   or larger) ortissue from your vagina. Save any tissue for your caregiver to inspect.   Your bleeding increases.   You have a thick, bad-smelling vaginal discharge.  You become lightheaded, weak, or you faint.   You have chills.  MAKE SURE YOU:  Understand these instructions.  Will watch your condition.  Will get help right away if you are not doing well or get worse.   This  information is not intended to replace advice given to you by your health care provider. Make sure you discuss any questions you have with your health care provider.   Document Released: 05/14/2001 Document Revised: 03/15/2013 Document Reviewed: 01/07/2012 Elsevier Interactive Patient Education [employer]. Follow up in 1 week for [place] and see me

## 2016-04-09 NOTE — Progress Notes (Signed)
Subjective:  Lynn Roberts is a 30 y.o. G66P0 Caucasian female at [redacted]w[redacted]d by [place] being seen today for her first obstetrical visit.  Her obstetrical history is significant for obesity, and smoker  Pregnancy history fully reviewed.  Patient reports nausea. Denies vb, cramping, uti s/s, abnormal/malodorous vag d/c, or vulvovaginal itching/irritation.  BP 114/64 mmHg  Pulse 60  Wt 243 lb (110.224 kg)  LMP 02/02/2016 (Exact Date)  HISTORY: OB History  Gravida Para Term Preterm AB SAB TAB Ectopic Multiple Living  1             # Outcome Date GA Lbr Len/2nd Weight Sex Delivery Anes PTL Lv  1 Current              Past Medical History  Diagnosis Date  . Obesity   . PCOD (polycystic ovarian disease)     no menses  . Migraines   . GERD (gastroesophageal reflux disease)   . History of PAT (paroxysmal atrial tachycardia)     had cardiac ablation as a teenager  . Papilloma of breast 10/2014    left  . Complication of anesthesia     is of [name] descent  . Arthritis of low back   . Rash of neck 10/11/2014    left  . Supervision of normal pregnancy 04/09/2016     [facility] Initiated Care at   7+1 week FOB  Charlann Noss Dating By  [place]  Pap  04/09/16 GC/CT Initial:                36+wks: Genetic Screen NT/IT:  CF screen  Anatomic [place]  Flu vaccine  Tdap Recommended ~ 28wks Glucose Screen  2 hr GBS  Feed Preference  Contraception  Circumcision  Childbirth Classes  Pediatrician     Past Surgical History  Procedure Laterality Date  . Cardiac electrophysiology study and ablation  age 57  . Cholecystectomy  04/27/2008  . Breast surgery Right 07/27/2004    retro-areolar dissection with exc. of large duct  . Breast lumpectomy Left 2011    papilloma  . Gastroplasty duodenal switch  01/2014  . Ovarian cyst removal Bilateral   . Unilateral salpingectomy    . Toenail excision    . Breast lumpectomy with radioactive seed localization Left 10/18/2014    Procedure:  RADIOACTIVE SEED GUIDED  EXCISIONAL BREAST BIOPSY;  Surgeon: Alphonsa Overall, MD;  Location: [facility];  Service: General;  Laterality: Left;   Family History  Problem Relation Age of Onset  . Cancer Paternal Grandfather   . Cancer Maternal Grandmother   . Diabetes Maternal Grandfather   . Hypertension Father   . Hyperlipidemia Father   . Hypertension Mother   . Diabetes Mother     Exam   System:     General: Well developed & nourished, no acute distress   Skin: Warm & dry, normal coloration and turgor, no rashes   Neurologic: Alert & oriented, normal mood   Cardiovascular: Regular rate & rhythm   Respiratory: Effort & rate normal, LCTAB, acyanotic   Abdomen: Soft, non tender   Extremities: normal strength, tone   Pelvic Exam:    Perineum: Normal perineum   Vulva: Normal, no lesions   Vagina:  Normal mucosa, normal discharge   Cervix: Normal, bulbous, appears closed   Uterus: Normal size/shape/contour for GA   Thin prep pap smear done with GC/CHL FHR: No FHM on [place]   Assessment:   Pregnancy: G1P0 Patient Active Problem  List   Diagnosis Date Noted  . Supervision of normal pregnancy 04/09/2016  . Encounter for IUD removal 02/02/2016  . Papilloma of breast, left 06/08/2014    106w1d G1P0 New OB visit     Plan:  Initial labs drawn Continue prenatal vitamins Problem list reviewed and updated Reviewed n/v relief measures and warning s/s to report Reviewed recommended weight gain based on pre-gravid BMI Encouraged well-balanced diet Genetic Screening discussed Integrated Screen: cancelled due to no FHM Cystic fibrosis screening discussed requested, cancelled due to miscarriage Ultrasound discussed; fetal survey: requested  Follow up in 1 weeks for Follow up US and see me Discussed watching for now or cytotec, and will watch  Check QHCG  Blood type B+ per [facility]  Estill Dooms, NP 04/09/2016 4:39 PM

## 2016-04-09 NOTE — Progress Notes (Signed)
US TV 7+1wks by previous ultrasound,NO fht seen,ys,normal ov's bilat,crl 8.7 mm = 6+5wks by today's ultrasound,pt was seen by [facility] following ultrasound.

## 2016-04-10 LAB — BETA HCG QUANT (REF LAB): hCG Quant: 44139 m[IU]/mL

## 2016-04-10 NOTE — Progress Notes (Signed)
It is noteworthy pt had duodenal switch at [facility] and has lost 450 lbs she says.She has history of PCO, also and Had IUD removed in February.

## 2016-04-11 LAB — PMP SCREEN PROFILE (10S), URINE
Amphetamine Screen, Ur: NEGATIVE ng/mL
Barbiturate Screen, Ur: NEGATIVE ng/mL
Benzodiazepine Screen, Urine: NEGATIVE ng/mL
Cannabinoids Ur Ql Scn: NEGATIVE ng/mL
Cocaine(Metab.)Screen, Urine: NEGATIVE ng/mL
Creatinine(Crt), U: 115.6 mg/dL (ref 20.0–300.0)
Methadone Scn, Ur: NEGATIVE ng/mL
Opiate Scrn, Ur: NEGATIVE ng/mL
Oxycodone+Oxymorphone Ur Ql Scn: NEGATIVE ng/mL
PCP Scrn, Ur: NEGATIVE ng/mL
Ph of Urine: 5.5 (ref 4.5–8.9)
Propoxyphene, Screen: NEGATIVE ng/mL

## 2016-04-11 LAB — CYTOLOGY - PAP

## 2016-04-12 ENCOUNTER — Telehealth: Payer: Self-pay | Admitting: Adult Health

## 2016-04-12 MED ORDER — PROMETHAZINE HCL 25 MG PO TABS: 25.0000 mg | ORAL_TABLET | Freq: Four times a day (QID) | ORAL | Status: DC | PRN

## 2016-04-12 NOTE — Telephone Encounter (Signed)
Spoke with pt. Pt was in on Tuesday and baby didn't have a heartbeat. Pt is not bleeding yet, but is having nausea, dizziness, and lightheaded. Pt is requesting something for nausea. Please advise. Thanks!! Hill

## 2016-04-12 NOTE — Telephone Encounter (Signed)
Will prescribe phenergan

## 2016-04-16 ENCOUNTER — Ambulatory Visit (INDEPENDENT_AMBULATORY_CARE_PROVIDER_SITE_OTHER): Payer: Medicaid Other | Admitting: Adult Health

## 2016-04-16 ENCOUNTER — Ambulatory Visit (INDEPENDENT_AMBULATORY_CARE_PROVIDER_SITE_OTHER): Payer: Medicaid Other

## 2016-04-16 ENCOUNTER — Encounter: Payer: Self-pay | Admitting: Adult Health

## 2016-04-16 VITALS — BP 100/62 | HR 66 | Ht 71.0 in | Wt 246.5 lb

## 2016-04-16 DIAGNOSIS — O039 Complete or unspecified spontaneous abortion without complication: Secondary | ICD-10-CM | POA: Insufficient documentation

## 2016-04-16 HISTORY — DX: Complete or unspecified spontaneous abortion without complication: O03.9

## 2016-04-16 NOTE — Progress Notes (Signed)
Subjective:     Patient ID: Lynn Roberts, female   DOB: [birth year], 30 y.o.   MRN: [id number]  HPI Amanda-Lee is a 30 year old white female in for follow up US for miscarriage, no bleeding yet.  Review of Systems Patient denies any headaches, hearing loss, fatigue, blurred vision, shortness of breath, chest pain, abdominal pain, problems with bowel movements, urination, or intercourse. No joint pain or mood swings.No bleeding. Reviewed past medical,surgical, social and family history. Reviewed medications and allergies.     Objective:   Physical Exam BP 100/62 mmHg  Pulse 66  Ht 5\' 11"  (1.803 m)  Wt 246 lb 8 oz (111.812 kg)  BMI 34.40 kg/m2  LMP 02/02/2016 (Exact Date)  Breastfeeding? Unknown  Reviewed [place] with her and family, US shows CL 9.2 mm no growth and no fetal heart motion, discussed option of watching or cytotec and she wants to wait and watch for now.Blood type B+ per [facility], and QHCG was 44.139 on 5/9. Assessment:     Miscarriage     Plan:     Follow up in 2 weeks, call with any questions or concerns

## 2016-04-16 NOTE — Progress Notes (Signed)
[place] f/u 8+1wks, NO FHT,CRL 9.2 mm= 6+5 wks,normal ov's bilat,no change from previous ultrasound,pt is seeing [facility] today.

## 2016-04-16 NOTE — Patient Instructions (Signed)
Follow up with me in 2 weeks

## 2016-04-29 ENCOUNTER — Inpatient Hospital Stay (HOSPITAL_COMMUNITY): Payer: Medicaid Other

## 2016-04-29 ENCOUNTER — Encounter (HOSPITAL_COMMUNITY): Payer: Self-pay | Admitting: *Deleted

## 2016-04-29 ENCOUNTER — Inpatient Hospital Stay (HOSPITAL_COMMUNITY)
Admission: AD | Admit: 2016-04-29 | Discharge: 2016-04-29 | Disposition: A | Discharge status: Home / Self Care | Payer: Medicaid Other | Source: Ambulatory Visit | Attending: Family Medicine | Admitting: Family Medicine

## 2016-04-29 DIAGNOSIS — Z8249 Family history of ischemic heart disease and other diseases of the circulatory system: Secondary | ICD-10-CM | POA: Diagnosis not present

## 2016-04-29 DIAGNOSIS — O209 Hemorrhage in early pregnancy, unspecified: Secondary | ICD-10-CM

## 2016-04-29 DIAGNOSIS — Z833 Family history of diabetes mellitus: Secondary | ICD-10-CM | POA: Insufficient documentation

## 2016-04-29 DIAGNOSIS — Z888 Allergy status to other drugs, medicaments and biological substances status: Secondary | ICD-10-CM | POA: Insufficient documentation

## 2016-04-29 DIAGNOSIS — Z882 Allergy status to sulfonamides status: Secondary | ICD-10-CM | POA: Insufficient documentation

## 2016-04-29 DIAGNOSIS — O039 Complete or unspecified spontaneous abortion without complication: Secondary | ICD-10-CM | POA: Diagnosis not present

## 2016-04-29 DIAGNOSIS — F17210 Nicotine dependence, cigarettes, uncomplicated: Secondary | ICD-10-CM | POA: Diagnosis not present

## 2016-04-29 DIAGNOSIS — O4690 Antepartum hemorrhage, unspecified, unspecified trimester: Secondary | ICD-10-CM | POA: Diagnosis present

## 2016-04-29 LAB — CBC
HCT: 33.9 % — ABNORMAL LOW (ref 36.0–46.0)
Hemoglobin: 11.1 g/dL — ABNORMAL LOW (ref 12.0–15.0)
MCH: 26.9 pg (ref 26.0–34.0)
MCHC: 32.7 g/dL (ref 30.0–36.0)
MCV: 82.3 fL (ref 78.0–100.0)
Platelets: 203 10*3/uL (ref 150–400)
RBC: 4.12 MIL/uL (ref 3.87–5.11)
RDW: 15.4 % (ref 11.5–15.5)
WBC: 7 10*3/uL (ref 4.0–10.5)

## 2016-04-29 LAB — URINE MICROSCOPIC-ADD ON: Squamous Epithelial / LPF: NONE SEEN

## 2016-04-29 LAB — URINALYSIS, ROUTINE W REFLEX MICROSCOPIC
Bilirubin Urine: NEGATIVE
Glucose, UA: NEGATIVE mg/dL
Ketones, ur: NEGATIVE mg/dL
Leukocytes, UA: NEGATIVE
Nitrite: NEGATIVE
Protein, ur: NEGATIVE mg/dL
Specific Gravity, Urine: 1.025 (ref 1.005–1.030)
pH: 5.5 (ref 5.0–8.0)

## 2016-04-29 MED ORDER — ONDANSETRON 4 MG PO TBDP: 4.0000 mg | ORAL_TABLET | Freq: Three times a day (TID) | ORAL | Status: DC | PRN

## 2016-04-29 MED ORDER — OXYCODONE-ACETAMINOPHEN 5-325 MG PO TABS
1.0000 | ORAL_TABLET | Freq: Once | ORAL | Status: AC
Start: 2016-04-29 — End: 2016-04-29
  Administered 2016-04-29: 1 via ORAL
  Filled 2016-04-29: qty 1

## 2016-04-29 MED ORDER — ONDANSETRON 4 MG PO TBDP
4.0000 mg | ORAL_TABLET | Freq: Once | ORAL | Status: AC
  Administered 2016-04-29: 4 mg via ORAL
  Filled 2016-04-29: qty 1

## 2016-04-29 MED ORDER — ACETAMINOPHEN-CODEINE #3 300-30 MG PO TABS: 1.0000 | ORAL_TABLET | ORAL | Status: DC | PRN

## 2016-04-29 NOTE — MAU Note (Signed)
Pt states she started spotting Saturday, had U/S - says baby stopped growing at 6 weeks.  Woke up @ 0430 having lower abd pain, started bleeding at 0800.

## 2016-04-29 NOTE — MAU Note (Signed)
Pt called RN into room.  Pt had saturated peripad and large basketball size spot on chuck pad under her.  Large tissue/clot removed and given to E. Purcell Nails, NP to examine.

## 2016-04-29 NOTE — MAU Provider Note (Signed)
History     CSN: [id number]  Arrival date and time: 04/29/16 [id number]   First Provider Initiated Contact with Patient 04/29/16 1013       Chief Complaint  Patient presents with  . Abdominal Pain  . Vaginal Bleeding   HPI  Lynn Roberts is a 30 y.o. G1P0000 at [redacted]w[redacted]d by LMP who presents for vaginal bleeding. Was told at [facility] that she was having a miscarriage; pt opted for expectant management. Reports spotting on Saturday. Woke up this morning with heavy bleeding. States she has saturated 4 pads this morning. Has passed a few dime sized clots.  Reports abdominal pain that is cramp like. Rates 8/10. Has not treated. Pain improved with lying down.  Denies fever/chills.   OB History    Gravida Para Term Preterm AB TAB SAB Ectopic Multiple Living   1    0  0  0       Past Medical History  Diagnosis Date  . Obesity   . PCOD (polycystic ovarian disease)     no menses  . Migraines   . GERD (gastroesophageal reflux disease)   . History of PAT (paroxysmal atrial tachycardia)     had cardiac ablation as a teenager  . Papilloma of breast 10/2014    left  . Complication of anesthesia     is of [name] descent  . Arthritis of low back   . Rash of neck 10/11/2014    left  . Supervision of normal pregnancy 04/09/2016     [facility]iated [facility] at   7+1 week FOB  Charlann Noss Dating By  [place]  Pap  04/09/16 GC/CT Initial:                36+wks: Genetic Screen NT/IT:  CF screen  Anatomic [place]  Flu vaccine  Tdap Recommended ~ 28wks Glucose Screen  2 hr GBS  Feed Preference  Contraception  Circumcision  Childbirth Classes  Pediatrician    . Miscarriage 04/16/2016    Past Surgical History  Procedure Laterality Date  . Cardiac electrophysiology study and ablation  age 58  . Cholecystectomy  04/27/2008  . Breast surgery Right 07/27/2004    retro-areolar dissection with exc. of large duct  . Breast lumpectomy Left 2011    papilloma  . Gastroplasty duodenal switch  01/2014  .  Ovarian cyst removal Bilateral   . Unilateral salpingectomy    . Toenail excision    . Breast lumpectomy with radioactive seed localization Left 10/18/2014    Procedure:  RADIOACTIVE SEED GUIDED EXCISIONAL BREAST BIOPSY;  Surgeon: Alphonsa Overall, MD;  Location: [place];  Service: General;  Laterality: Left;    Family History  Problem Relation Age of Onset  . Cancer Paternal Grandfather   . Cancer Maternal Grandmother   . Diabetes Maternal Grandfather   . Hypertension Father   . Hyperlipidemia Father   . Hypertension Mother   . Diabetes Mother     Social History  Substance Use Topics  . Smoking status: Current Every Day Smoker -- 1.00 packs/day for 10 years    Types: Cigarettes  . Smokeless tobacco: Never Used  . Alcohol Use: No     Comment: occ.    Allergies:  Allergies  Allergen Reactions  . Nsaids Other (See Comments)    HX. OF WEIGHT-LOSS SURGERY  . Other Rash    LOTIONS   . Sulfa Antibiotics Rash  . Tramadol Rash    Prescriptions prior  to admission  Medication Sig Dispense Refill Last Dose  . glucose 4 GM chewable tablet Chew 1 tablet by mouth once as needed for low blood sugar.   Taking  . Prenatal Vit-Fe Fumarate-FA (MULTIVITAMIN-PRENATAL) 27-0.8 MG TABS tablet Take 1 tablet by mouth daily at 12 noon.   Taking  . promethazine (PHENERGAN) 25 MG tablet Take 1 tablet (25 mg total) by mouth every 6 (six) hours as needed for nausea or vomiting. 30 tablet 1 Taking  . traZODone (DESYREL) 50 MG tablet Take 150 mg by mouth at bedtime as needed for sleep. Reported on 04/09/2016   Taking    Review of Systems  Constitutional: Negative.   Gastrointestinal: Positive for nausea, abdominal pain and diarrhea (states this is her normal d/t weightloss surgery). Negative for vomiting and constipation.  Genitourinary: Negative for dysuria.       +vaginal bleeding  Neurological: Negative for dizziness.   Physical Exam   Blood pressure 125/74, pulse 58, temperature  97.9 F (36.6 C), temperature source Oral, resp. rate 18, last menstrual period 02/02/2016, unknown if currently breastfeeding.  Physical Exam  Nursing note and vitals reviewed. Constitutional: She is oriented to person, place, and time. She appears well-developed and well-nourished. No distress.  HENT:  Head: Normocephalic and atraumatic.  Eyes: Conjunctivae are normal. Right eye exhibits no discharge. Left eye exhibits no discharge. No scleral icterus.  Neck: Normal range of motion.  Cardiovascular: Normal rate, regular rhythm and normal heart sounds.   No murmur heard. Respiratory: Effort normal and breath sounds normal. No respiratory distress. She has no wheezes.  GI: Soft. Bowel sounds are normal. There is no tenderness.  Genitourinary: Uterus normal. Cervix exhibits no motion tenderness. There is bleeding (large clot & moderate amount of dark red blood) in the vagina.  Cervix dilated 0.5cm  Neurological: She is alert and oriented to person, place, and time.  Skin: Skin is warm and dry. She is not diaphoretic.  Psychiatric: She has a normal mood and affect. Her behavior is normal. Judgment and thought content normal.    MAU Course  Procedures Results for orders placed or performed during the hospital encounter of 04/29/16 (from the past 24 hour(s))  Urinalysis, Routine w reflex microscopic (not at [facility])     Status: Abnormal   Collection Time: 04/29/16  9:40 AM  Result Value Ref Range   Color, Urine YELLOW YELLOW   APPearance HAZY (A) CLEAR   Specific Gravity, Urine 1.025 1.005 - 1.030   pH 5.5 5.0 - 8.0   Glucose, UA NEGATIVE NEGATIVE mg/dL   Hgb urine dipstick LARGE (A) NEGATIVE   Bilirubin Urine NEGATIVE NEGATIVE   Ketones, ur NEGATIVE NEGATIVE mg/dL   Protein, ur NEGATIVE NEGATIVE mg/dL   Nitrite NEGATIVE NEGATIVE   Leukocytes, UA NEGATIVE NEGATIVE  Urine microscopic-add on     Status: Abnormal   Collection Time: 04/29/16  9:40 AM  Result Value Ref Range   Squamous  Epithelial / LPF NONE SEEN NONE SEEN   WBC, UA 0-5 0 - 5 WBC/hpf   RBC / HPF TOO NUMEROUS TO COUNT 0 - 5 RBC/hpf   Bacteria, UA FEW (A) NONE SEEN   Urine-Other MUCOUS PRESENT   CBC     Status: Abnormal   Collection Time: 04/29/16 10:27 AM  Result Value Ref Range   WBC 7.0 4.0 - 10.5 K/uL   RBC 4.12 3.87 - 5.11 MIL/uL   Hemoglobin 11.1 (L) 12.0 - 15.0 g/dL   HCT 33.9 (L) 36.0 -  46.0 %   MCV 82.3 78.0 - 100.0 fL   MCH 26.9 26.0 - 34.0 pg   MCHC 32.7 30.0 - 36.0 g/dL   RDW 15.4 11.5 - 15.5 %   Platelets 203 150 - 400 K/uL   US Ob Transvaginal  04/29/2016  CLINICAL DATA:  30 year old pregnant female with heavy vaginal bleeding for 1 week. EDC by initial (04/01/2016) sonogram: 11/25/2016, projecting to an expected gestational age of [redacted] weeks 0 days. EXAM: TRANSVAGINAL OB ULTRASOUND TECHNIQUE: Transvaginal ultrasound was performed for complete evaluation of the gestation as well as the maternal uterus, adnexal regions, and pelvic cul-de-sac. COMPARISON:  04/16/2016 obstetric scan. FINDINGS: The anteverted uterus measures 11.5 x 5.3 x 6.9 cm in size. No uterine fibroids or other myometrial abnormality. No evidence of an intrauterine gestational sac. The endometrium is thickened and heterogeneous, measuring 29 mm in bilayer thickness, most consistent with endometrial blood products. No endometrial vascularity or focal endometrial mass demonstrated. There is heterogeneous material distending the endocervical canal, most consistent with blood product. Right ovary measures 5.2 x 3.5 x 4.9 cm. Left ovary measures 4.0 x 1.4 x 3.1 cm. No ovarian or adnexal masses. No abnormal free fluid in the pelvis. IMPRESSION: 1. No evidence of an intrauterine gestational sac. Thickened (29 mm), avascular and heterogeneous endometrium, consistent with endometrial blood products, which extend into the endocervical canal. Findings are consistent with a spontaneous abortion in progress. A follow-up pelvic sonogram could be  obtained as clinically warranted if there is clinical concern for retained products of conception. 2. No suspicious ovarian or adnexal findings. Electronically Signed   By: Ilona Sorrel M.D.   On: 04/29/2016 12:46    MDM Pt called RN into room b/c she passed large clot -- tissue like material sent to pathology Hemoglobin stable VSS Percocet & zofran given in MAU Ultrasound shows no IUP -- miscarriage completed Bleeding has decreased since pelvic exam Pt reports improvement in pain  Assessment and Plan  A: 1. Miscarriage   2. Vaginal bleeding in pregnancy, first trimester     P; Discharge home Rx tylenol#3 & zofran Call [facility] for follow up appt Pelvic rest Discussed reasons to return to [place] 04/29/2016, 10:11 AM

## 2016-04-29 NOTE — Discharge Instructions (Signed)
Miscarriage A miscarriage is the sudden loss of an unborn baby (fetus) before the 20th week of pregnancy. Most miscarriages happen in the first 3 months of pregnancy. Sometimes, it happens before a woman even knows she is pregnant. A miscarriage is also called a "spontaneous miscarriage" or "early pregnancy loss." Having a miscarriage can be an emotional experience. Talk with your caregiver about any questions you may have about miscarrying, the grieving process, and your future pregnancy plans. CAUSES   Problems with the fetal chromosomes that make it impossible for the baby to develop normally. Problems with the baby's genes or chromosomes are most often the result of errors that occur, by chance, as the embryo divides and grows. The problems are not inherited from the parents.  Infection of the cervix or uterus.   Hormone problems.   Problems with the cervix, such as having an incompetent cervix. This is when the tissue in the cervix is not strong enough to hold the pregnancy.   Problems with the uterus, such as an abnormally shaped uterus, uterine fibroids, or congenital abnormalities.   Certain medical conditions.   Smoking, drinking alcohol, or taking illegal drugs.   Trauma.  Often, the cause of a miscarriage is unknown.  SYMPTOMS   Vaginal bleeding or spotting, with or without cramps or pain.  Pain or cramping in the abdomen or lower back.  Passing fluid, tissue, or blood clots from the vagina. DIAGNOSIS  Your caregiver will perform a physical exam. You may also have an ultrasound to confirm the miscarriage. Blood or urine tests may also be ordered. TREATMENT   Sometimes, treatment is not necessary if you naturally pass all the fetal tissue that was in the uterus. If some of the fetus or placenta remains in the body (incomplete miscarriage), tissue left behind may become infected and must be removed. Usually, a dilation and curettage (D and C) procedure is performed.  During a D and C procedure, the cervix is widened (dilated) and any remaining fetal or placental tissue is gently removed from the uterus.  Antibiotic medicines are prescribed if there is an infection. Other medicines may be given to reduce the size of the uterus (contract) if there is a lot of bleeding.  If you have Rh negative blood and your baby was Rh positive, you will need a Rh immunoglobulin shot. This shot will protect any future baby from having Rh blood problems in future pregnancies. HOME CARE INSTRUCTIONS   Your caregiver may order bed rest or may allow you to continue light activity. Resume activity as directed by your caregiver.  Have someone help with home and family responsibilities during this time.   Keep track of the number of sanitary pads you use each day and how soaked (saturated) they are. Write down this information.   Do not use tampons. Do not douche or have sexual intercourse until approved by your caregiver.   Only take over-the-counter or prescription medicines for pain or discomfort as directed by your caregiver.   Do not take aspirin. Aspirin can cause bleeding.   Keep all follow-up appointments with your caregiver.   If you or your partner have problems with grieving, talk to your caregiver or seek counseling to help cope with the pregnancy loss. Allow enough time to grieve before trying to get pregnant again.  SEEK IMMEDIATE MEDICAL CARE IF:   You have severe cramps or pain in your back or abdomen.  You have a fever.  You pass large blood clots (walnut-sized  or larger) ortissue from your vagina. Save any tissue for your caregiver to inspect.   Your bleeding increases.   You have a thick, bad-smelling vaginal discharge.  You become lightheaded, weak, or you faint.   You have chills.  MAKE SURE YOU:  Understand these instructions.  Will watch your condition.  Will get help right away if you are not doing well or get worse.   This  information is not intended to replace advice given to you by your health care provider. Make sure you discuss any questions you have with your health care provider.   Document Released: 05/14/2001 Document Revised: 03/15/2013 Document Reviewed: 01/07/2012 Elsevier Interactive Patient Education 2016 [place].  Pelvic Rest Pelvic rest is sometimes recommended for women when:   The placenta is partially or completely covering the opening of the cervix (placenta previa).  There is bleeding between the uterine wall and the amniotic sac in the first trimester (subchorionic hemorrhage).  The cervix begins to open without labor starting (incompetent cervix, cervical insufficiency).  The labor is too early (preterm labor). HOME CARE INSTRUCTIONS  Do not have sexual intercourse, stimulation, or an orgasm.  Do not use tampons, douche, or put anything in the vagina.  Do not lift anything over 10 pounds (4.5 kg).  Avoid strenuous activity or straining your pelvic muscles. SEEK MEDICAL CARE IF:  You have any vaginal bleeding during pregnancy. Treat this as a potential emergency.  You have cramping pain felt low in the stomach (stronger than menstrual cramps).  You notice vaginal discharge (watery, mucus, or bloody).  You have a low, dull backache.  There are regular contractions or uterine tightening. SEEK IMMEDIATE MEDICAL CARE IF: You have vaginal bleeding and have placenta previa.    This information is not intended to replace advice given to you by your health care provider. Make sure you discuss any questions you have with your health care provider.   Document Released: 03/15/2011 Document Revised: 02/10/2012 Document Reviewed: 05/22/2015 Elsevier Interactive Patient Education [employer].

## 2016-04-30 ENCOUNTER — Encounter: Payer: Self-pay | Admitting: Adult Health

## 2016-04-30 ENCOUNTER — Ambulatory Visit (INDEPENDENT_AMBULATORY_CARE_PROVIDER_SITE_OTHER): Payer: Medicaid Other | Admitting: Adult Health

## 2016-04-30 VITALS — BP 100/50 | HR 66 | Ht 71.0 in | Wt 237.5 lb

## 2016-04-30 DIAGNOSIS — O039 Complete or unspecified spontaneous abortion without complication: Secondary | ICD-10-CM | POA: Diagnosis not present

## 2016-04-30 NOTE — Progress Notes (Signed)
Subjective:     Patient ID: Lynn Roberts, female   DOB: 08/24/1986, 30 y.o.   MRN: [id number]  HPI Lynn Roberts is a 30 year old white female, back in follow up of having completed miscarriage yesterday, was seen at [facility] and had [place].Bleeding has slowed now.No pain.  Review of Systems Bleeding after miscarriage,no pain Reviewed past medical,surgical, social and family history. Reviewed medications and allergies.     Objective:   Physical Exam BP 100/50 mmHg  Pulse 66  Ht 5\' 11"  (1.803 m)  Wt 237 lb 8 oz (107.729 kg)  BMI 33.14 kg/m2  LMP 02/02/2016 (Exact Date)  Breastfeeding? No   Talk only, she said bleeding started yesterday,was heavy and passed clots and tissue, bleeding better now and she wants to get pregnant again soon.She says she has had PCO in past.  Assessment:     Miscarriage     Plan:     Check QHCG in 2 weeks Continue prenatal vitamins  Call with next period or not, may need clomid if no period

## 2016-04-30 NOTE — Patient Instructions (Signed)
Continue prenatal vitamins Get QHCG in 2 weeks  Follow up prn

## 2016-05-06 ENCOUNTER — Telehealth: Payer: Self-pay | Admitting: Adult Health

## 2016-05-06 NOTE — Telephone Encounter (Signed)
Spoke with pt. Pt wants to know the results from pathology. [place]

## 2016-05-06 NOTE — Telephone Encounter (Signed)
Pt aware pathology showed fetal tissue

## 2016-05-06 NOTE — Telephone Encounter (Signed)
Pt called stating that she would like to know the results of her blood work, pt states that she was having a Miscarriage and she had to give blood. Please contact pt

## 2016-05-07 ENCOUNTER — Encounter: Payer: Self-pay | Admitting: Adult Health

## 2016-05-14 ENCOUNTER — Telehealth: Payer: Self-pay | Admitting: Adult Health

## 2016-05-14 NOTE — Telephone Encounter (Signed)
Left message x 1. JSY 

## 2016-05-14 NOTE — Telephone Encounter (Signed)
Spoke with pt. Pt has whelps on arms and legs. Using Hydrocortisone but not helping. + itches and burns. Pt recently had a miscarriage. Pt don't have a PCP. I advised someone needs to look at rash. Pt voiced understanding and call was transferred to front desk for appt. Many

## 2016-05-15 ENCOUNTER — Emergency Department (HOSPITAL_COMMUNITY)
Admission: EM | Admit: 2016-05-15 | Discharge: 2016-05-15 | Disposition: A | Discharge status: Home / Self Care | Payer: Medicaid Other | Attending: Emergency Medicine | Admitting: Emergency Medicine

## 2016-05-15 ENCOUNTER — Encounter (HOSPITAL_COMMUNITY): Payer: Self-pay | Admitting: Emergency Medicine

## 2016-05-15 ENCOUNTER — Telehealth: Payer: Self-pay | Admitting: Adult Health

## 2016-05-15 DIAGNOSIS — L255 Unspecified contact dermatitis due to plants, except food: Secondary | ICD-10-CM | POA: Insufficient documentation

## 2016-05-15 DIAGNOSIS — R21 Rash and other nonspecific skin eruption: Secondary | ICD-10-CM | POA: Diagnosis present

## 2016-05-15 DIAGNOSIS — M479 Spondylosis, unspecified: Secondary | ICD-10-CM | POA: Insufficient documentation

## 2016-05-15 DIAGNOSIS — F17210 Nicotine dependence, cigarettes, uncomplicated: Secondary | ICD-10-CM | POA: Diagnosis not present

## 2016-05-15 LAB — BETA HCG QUANT (REF LAB): hCG Quant: 11 m[IU]/mL

## 2016-05-15 MED ORDER — DEXAMETHASONE SODIUM PHOSPHATE 4 MG/ML IJ SOLN
10.0000 mg | Freq: Once | INTRAMUSCULAR | Status: AC
  Administered 2016-05-15: 10 mg via INTRAMUSCULAR
  Filled 2016-05-15: qty 3

## 2016-05-15 MED ORDER — PREDNISONE 20 MG PO TABS: 40.0000 mg | ORAL_TABLET | Freq: Every day | ORAL | Status: DC

## 2016-05-15 MED ORDER — DIPHENHYDRAMINE HCL 25 MG PO CAPS
25.0000 mg | ORAL_CAPSULE | Freq: Once | ORAL | Status: AC
  Administered 2016-05-15: 25 mg via ORAL
  Filled 2016-05-15: qty 1

## 2016-05-15 NOTE — Discharge Instructions (Signed)
Poison [facility] is a rash caused by touching the leaves of the poison oak plant. You may have a rash with redness and itching. Sometimes, blisters appear and break open. Your eyes may get puffy (swollen). Poison oak often heals in 2 to 3 weeks without treatment.  HOME CARE  If you touch poison oak:  Wash your skin with soap and water right away. Wash under your fingernails. Do not rub the skin very hard.  Wash any clothes you were wearing.  Avoid poison oak in the future. Poison oak usually has 3 leaves on a stem.  Use medicines to help with itching as told by your doctor. Do not drive when you take this medicine.  Keep open sores dry, clean, and covered with a bandage and medicated cream, if needed.  Ask your doctor about medicine for children. GET HELP RIGHT AWAY IF:  You have open sores.  Redness spreads beyond the area of the rash.  There is yellowish white fluid (pus) coming from the rash.  Pain gets worse.  You have a temperature by mouth above 102 F (38.9 C), not controlled by medicine. MAKE SURE YOU:  Understand these instructions.  Will watch your condition.  Will get help right away if you are not doing well or get worse.   This information is not intended to replace advice given to you by your health care provider. Make sure you discuss any questions you have with your health care provider.   Document Released: 12/21/2010 Document Revised: 02/10/2012 Document Reviewed: 04/26/2015 Elsevier Interactive Patient Education [employer].

## 2016-05-15 NOTE — Telephone Encounter (Signed)
Left message QHCG 11, do not have to repeat, call with next period or not

## 2016-05-15 NOTE — ED Notes (Signed)
Pt reports generalized rash for last several weeks. Pt denies any new self care products. Pt reports generalized pins and needles sensation. Airway patent. nad noted.

## 2016-05-15 NOTE — ED Notes (Signed)
EDP at bedside  

## 2016-05-16 NOTE — ED Provider Notes (Signed)
CSN: [id number]     Arrival date & time 05/15/16  1716 History   First MD Initiated Contact with Patient 05/15/16 1728     Chief Complaint  Patient presents with  . Rash     (Consider location/radiation/quality/duration/timing/severity/associated sxs/prior Treatment) HPI  Lynn Roberts is a 30 y.o. female who presents to the Emergency Department complaining of itching and rash to her chest, abdomen, back and both lower extremities.  Rash has been progressing for at least one week.  She reports working intermittently outside since onset.  She has been applying hydrocortisone cream without relief.  She also reports intermittent stinging sensation.  She denies fever, swelling, difficulty swallowing or shortness of the breath.    Past Medical History  Diagnosis Date  . Obesity   . PCOD (polycystic ovarian disease)     no menses  . Migraines   . GERD (gastroesophageal reflux disease)   . History of PAT (paroxysmal atrial tachycardia)     had cardiac ablation as a teenager  . Papilloma of breast 10/2014    left  . Complication of anesthesia     is of [name] descent  . Arthritis of low back   . Supervision of normal pregnancy 04/09/2016     [facility] Initiated Care at   7+1 week FOB  Charlann Noss Dating By  [place]  Pap  04/09/16 GC/CT Initial:                36+wks: Genetic Screen NT/IT:  CF screen  Anatomic [place]  Flu vaccine  Tdap Recommended ~ 28wks Glucose Screen  2 hr GBS  Feed Preference  Contraception  Circumcision  Childbirth Classes  Pediatrician    . Miscarriage 04/16/2016   Past Surgical History  Procedure Laterality Date  . Cardiac electrophysiology study and ablation  age 66  . Cholecystectomy  04/27/2008  . Breast surgery Right 07/27/2004    retro-areolar dissection with exc. of large duct  . Breast lumpectomy Left 2011    papilloma  . Gastroplasty duodenal switch  01/2014  . Ovarian cyst removal Bilateral   . Unilateral salpingectomy    . Toenail excision    .  Breast lumpectomy with radioactive seed localization Left 10/18/2014    Procedure:  RADIOACTIVE SEED GUIDED EXCISIONAL BREAST BIOPSY;  Surgeon: Alphonsa Overall, MD;  Location: [place];  Service: General;  Laterality: Left;   Family History  Problem Relation Age of Onset  . Cancer Paternal Grandfather   . Cancer Maternal Grandmother   . Diabetes Maternal Grandfather   . Hypertension Father   . Hyperlipidemia Father   . Hypertension Mother   . Diabetes Mother    Social History  Substance Use Topics  . Smoking status: Current Every Day Smoker -- 1.00 packs/day for 10 years    Types: Cigarettes  . Smokeless tobacco: Never Used  . Alcohol Use: No     Comment: occ.   OB History    Gravida Para Term Preterm AB TAB SAB Ectopic Multiple Living   1    0  0  0      Review of Systems  Constitutional: Negative for fever, chills, activity change and appetite change.  HENT: Negative for facial swelling, sore throat and trouble swallowing.   Respiratory: Negative for chest tightness, shortness of breath and wheezing.   Musculoskeletal: Negative for neck pain and neck stiffness.  Skin: Positive for rash. Negative for wound.  Neurological: Negative for dizziness, weakness, numbness and  headaches.  All other systems reviewed and are negative.     Allergies  Nsaids; Other; Sulfa antibiotics; and Tramadol  Home Medications   Prior to Admission medications   Medication Sig Start Date End Date Taking? Authorizing Provider  acetaminophen (TYLENOL) 500 MG tablet Take 1,000 mg by mouth every 6 (six) hours as needed for mild pain.    Historical Provider, MD  acetaminophen-codeine (TYLENOL #3) 300-30 MG tablet Take 1 tablet by mouth every 4 (four) hours as needed for moderate pain. 04/29/16   Jorje Guild, NP  glucose 4 GM chewable tablet Chew 1 tablet by mouth once as needed for low blood sugar.    Historical Provider, MD  ondansetron (ZOFRAN ODT) 4 MG disintegrating tablet Take 1  tablet (4 mg total) by mouth every 8 (eight) hours as needed for nausea or vomiting. 04/29/16   Jorje Guild, NP  predniSONE (DELTASONE) 20 MG tablet Take 2 tablets (40 mg total) by mouth daily. For 5 days 05/15/16   Vanassa Penniman, PA-C  Prenatal Vit-Fe Fumarate-FA (MULTIVITAMIN-PRENATAL) 27-0.8 MG TABS tablet Take 1 tablet by mouth daily at 12 noon.    Historical Provider, MD   BP 137/67 mmHg  Pulse 87  Temp(Src) 99.3 F (37.4 C) (Temporal)  Resp 18  Ht 5\' 11"  (1.803 m)  Wt 110.224 kg  BMI 33.91 kg/m2  SpO2 97% Physical Exam  Constitutional: She is oriented to person, place, and time. She appears well-developed and well-nourished. No distress.  HENT:  Head: Normocephalic and atraumatic.  Mouth/Throat: Oropharynx is clear and moist.  Neck: Normal range of motion. Neck supple.  Cardiovascular: Normal rate, regular rhythm, normal heart sounds and intact distal pulses.   No murmur heard. Pulmonary/Chest: Effort normal and breath sounds normal. No respiratory distress.  Musculoskeletal: She exhibits no edema or tenderness.  Lymphadenopathy:    She has no cervical adenopathy.  Neurological: She is alert and oriented to person, place, and time. She exhibits normal muscle tone. Coordination normal.  Skin: Skin is warm. Rash noted. There is erythema.  Erythematous papular rash with scattered vesicles in a linear distribution to the trunk and BLE's.  No drainage or edema.   Nursing note and vitals reviewed.   ED Course  Procedures (including critical care time) Labs Review Labs Reviewed - No data to display  Imaging Review No results found. I have personally reviewed and evaluated these images and lab results as part of my medical decision-making.   EKG Interpretation None      MDM   Final diagnoses:  Plant dermatitis    Scattered vesicular, erythematous rash to trunk and BLE that appears c/w plant dermatitis.  Agrees to prednisone and benadryl.  IM injection of decadron  given here.      Kem Parkinson, PA-C 05/16/16 [place], MD 05/22/16 [phone]

## 2016-05-21 ENCOUNTER — Ambulatory Visit: Payer: Medicaid Other | Admitting: Adult Health

## 2016-05-24 ENCOUNTER — Encounter: Payer: Self-pay | Admitting: Adult Health

## 2016-05-29 ENCOUNTER — Other Ambulatory Visit: Payer: Medicaid Other

## 2016-05-29 DIAGNOSIS — O039 Complete or unspecified spontaneous abortion without complication: Secondary | ICD-10-CM

## 2016-05-30 ENCOUNTER — Telehealth: Payer: Self-pay | Admitting: Adult Health

## 2016-05-30 LAB — BETA HCG QUANT (REF LAB): hCG Quant: 2 m[IU]/mL

## 2016-05-30 NOTE — Telephone Encounter (Signed)
Left message QHCG 2

## 2016-05-30 NOTE — Telephone Encounter (Signed)
busy

## 2016-07-14 ENCOUNTER — Emergency Department (HOSPITAL_COMMUNITY)
Admission: EM | Admit: 2016-07-14 | Discharge: 2016-07-14 | Disposition: A | Discharge status: Home / Self Care | Payer: Medicaid Other | Attending: Emergency Medicine | Admitting: Emergency Medicine

## 2016-07-14 ENCOUNTER — Encounter (HOSPITAL_COMMUNITY): Payer: Self-pay | Admitting: *Deleted

## 2016-07-14 DIAGNOSIS — J029 Acute pharyngitis, unspecified: Secondary | ICD-10-CM | POA: Diagnosis present

## 2016-07-14 DIAGNOSIS — F17210 Nicotine dependence, cigarettes, uncomplicated: Secondary | ICD-10-CM | POA: Diagnosis not present

## 2016-07-14 DIAGNOSIS — Z792 Long term (current) use of antibiotics: Secondary | ICD-10-CM | POA: Diagnosis not present

## 2016-07-14 DIAGNOSIS — Z79899 Other long term (current) drug therapy: Secondary | ICD-10-CM | POA: Insufficient documentation

## 2016-07-14 DIAGNOSIS — J069 Acute upper respiratory infection, unspecified: Secondary | ICD-10-CM | POA: Insufficient documentation

## 2016-07-14 MED ORDER — ACETAMINOPHEN 500 MG PO TABS
1000.0000 mg | ORAL_TABLET | Freq: Once | ORAL | Status: AC
  Administered 2016-07-14: 1000 mg via ORAL
  Filled 2016-07-14: qty 2

## 2016-07-14 MED ORDER — AMOXICILLIN 500 MG PO CAPS: 500.0000 mg | ORAL_CAPSULE | Freq: Three times a day (TID) | ORAL | 0 refills | Status: DC

## 2016-07-14 MED ORDER — AMOXICILLIN 250 MG PO CAPS
500.0000 mg | ORAL_CAPSULE | Freq: Once | ORAL | Status: AC
  Administered 2016-07-14: 500 mg via ORAL
  Filled 2016-07-14: qty 2

## 2016-07-14 NOTE — Discharge Instructions (Signed)
Your vital signs within normal limits. Please wash hands frequently. Salt water gargles maybe helpful. Use Tylenol every 4 hours for fever or aching. Use your favorite decongestant. Amoxil 3 times daily with food. Please see your primary physician, or return to the emergency department if not improving.

## 2016-07-14 NOTE — ED Triage Notes (Signed)
Pt c/o sore throat, headache that started Friday, denies any n/v/d

## 2016-07-15 NOTE — ED Provider Notes (Signed)
[place] DEPT Provider Note   CSN: [id number] Arrival date & time: 07/14/16  2006     History   Chief Complaint Chief Complaint  Patient presents with  . Sore Throat    HPI Lynn LANDECK is a 30 y.o. female.  The history is provided by the patient.  Sore Throat  This is a new problem. The current episode started more than 2 days ago. The problem occurs daily. The problem has been gradually worsening. Associated symptoms include headaches. Pertinent negatives include no shortness of breath. Associated symptoms comments: Congestion and cough. The symptoms are aggravated by swallowing. Nothing relieves the symptoms. She has tried rest for the symptoms. The treatment provided no relief.    Past Medical History:  Diagnosis Date  . Arthritis of low back   . Complication of anesthesia    is of [name] descent  . GERD (gastroesophageal reflux disease)   . History of PAT (paroxysmal atrial tachycardia)    had cardiac ablation as a teenager  . Migraines   . Miscarriage 04/16/2016  . Obesity   . Papilloma of breast 10/2014   left  . PCOD (polycystic ovarian disease)    no menses  . Supervision of normal pregnancy 04/09/2016    [facility] at   7+1 week FOB  Charlann Noss Dating By  [place]  Pap  04/09/16 GC/CT Initial:                36+wks: Genetic Screen NT/IT:  CF screen  Anatomic [place]  Flu vaccine  Tdap Recommended ~ 28wks Glucose Screen  2 hr GBS  Feed Preference  Contraception  Circumcision  Childbirth Classes  Pediatrician      Patient Active Problem List   Diagnosis Date Noted  . Miscarriage 04/16/2016  . Encounter for IUD removal 02/02/2016  . Papilloma of breast, left 06/08/2014    Past Surgical History:  Procedure Laterality Date  . BREAST LUMPECTOMY Left 2011   papilloma  . BREAST LUMPECTOMY WITH RADIOACTIVE SEED LOCALIZATION Left 10/18/2014   Procedure:  RADIOACTIVE SEED GUIDED EXCISIONAL BREAST BIOPSY;  Surgeon: Alphonsa Overall, MD;  Location:  [place] [facility];  Service: General;  Laterality: Left;  . BREAST SURGERY Right 07/27/2004   retro-areolar dissection with exc. of large duct  . CARDIAC ELECTROPHYSIOLOGY STUDY AND ABLATION  age 17  . CHOLECYSTECTOMY  04/27/2008  . GASTROPLASTY DUODENAL SWITCH  01/2014  . OVARIAN CYST REMOVAL Bilateral   . TOENAIL EXCISION    . UNILATERAL SALPINGECTOMY      OB History    Gravida Para Term Preterm AB Living   1       0     SAB TAB Ectopic Multiple Live Births   0     0         Home Medications    Prior to Admission medications   Medication Sig Start Date End Date Taking? Authorizing Provider  acetaminophen (TYLENOL) 500 MG tablet Take 1,000 mg by mouth every 6 (six) hours as needed for mild pain.    Historical Provider, MD  acetaminophen-codeine (TYLENOL #3) 300-30 MG tablet Take 1 tablet by mouth every 4 (four) hours as needed for moderate pain. 04/29/16   Jorje Guild, NP  amoxicillin (AMOXIL) 500 MG capsule Take 1 capsule (500 mg total) by mouth 3 (three) times daily. 07/14/16   Lily Kocher, PA-C  glucose 4 GM chewable tablet Chew 1 tablet by mouth once as needed for low blood sugar.  Historical Provider, MD  ondansetron (ZOFRAN ODT) 4 MG disintegrating tablet Take 1 tablet (4 mg total) by mouth every 8 (eight) hours as needed for nausea or vomiting. 04/29/16   Jorje Guild, NP  predniSONE (DELTASONE) 20 MG tablet Take 2 tablets (40 mg total) by mouth daily. For 5 days 05/15/16   Tammy Triplett, PA-C  Prenatal Vit-Fe Fumarate-FA (MULTIVITAMIN-PRENATAL) 27-0.8 MG TABS tablet Take 1 tablet by mouth daily at 12 noon.    Historical Provider, MD    Family History Family History  Problem Relation Age of Onset  . Cancer Paternal Grandfather   . Cancer Maternal Grandmother   . Diabetes Maternal Grandfather   . Hypertension Father   . Hyperlipidemia Father   . Hypertension Mother   . Diabetes Mother     Social History Social History  Substance Use Topics  . Smoking  status: Current Every Day Smoker    Packs/day: 1.00    Years: 10.00    Types: Cigarettes  . Smokeless tobacco: Never Used  . Alcohol use No     Comment: occ.     Allergies   Nsaids; Other; Sulfa antibiotics; and Tramadol   Review of Systems Review of Systems  Constitutional: Positive for appetite change and chills. Negative for fever.  HENT: Positive for congestion and sore throat. Negative for voice change.   Respiratory: Positive for cough. Negative for shortness of breath, wheezing and stridor.   Gastrointestinal: Negative for diarrhea, nausea and vomiting.  Musculoskeletal: Positive for myalgias.  Neurological: Positive for headaches.  All other systems reviewed and are negative.    Physical Exam Updated Vital Signs BP 137/88 (BP Location: Left Arm)   Pulse 69   Temp 98.4 F (36.9 C) (Oral)   Resp 18   Ht 5\' 11"  (1.803 m)   Wt 108.9 kg   LMP 06/16/2016   SpO2 100%   BMI 33.47 kg/m   Physical Exam  Constitutional: She is oriented to person, place, and time. She appears well-developed and well-nourished.  Non-toxic appearance.  HENT:  Head: Normocephalic.  Right Ear: Tympanic membrane and external ear normal.  Left Ear: Tympanic membrane and external ear normal.  Mouth/Throat: Uvula is midline. Uvula swelling present.  Nasal congestion present MIld increase redness of the posterior pharynx.  Eyes: EOM and lids are normal. Pupils are equal, round, and reactive to light.  Neck: Normal range of motion. Neck supple. Carotid bruit is not present.  Cardiovascular: Normal rate, regular rhythm, normal heart sounds, intact distal pulses and normal pulses.   Pulmonary/Chest: Breath sounds normal. No respiratory distress.  Abdominal: Soft. Bowel sounds are normal. There is no tenderness. There is no guarding.  Musculoskeletal: Normal range of motion.  Lymphadenopathy:       Head (right side): No submandibular adenopathy present.       Head (left side): No submandibular  adenopathy present.    She has no cervical adenopathy.  Neurological: She is alert and oriented to person, place, and time. She has normal strength. No cranial nerve deficit or sensory deficit.  Skin: Skin is warm and dry.  Psychiatric: She has a normal mood and affect. Her speech is normal.  Nursing note and vitals reviewed.    ED Treatments / Results  Labs (all labs ordered are listed, but only abnormal results are displayed) Labs Reviewed - No data to display  EKG  EKG Interpretation None       Radiology No results found.  Procedures Procedures (including critical care time)  Medications Ordered in ED Medications  acetaminophen (TYLENOL) tablet 1,000 mg (1,000 mg Oral Given 07/14/16 2127)  amoxicillin (AMOXIL) capsule 500 mg (500 mg Oral Given 07/14/16 2127)     Initial Impression / Assessment and Plan / ED Course  I have reviewed the triage vital signs and the nursing notes.  Pertinent labs & imaging results that were available during my care of the patient were reviewed by me and considered in my medical decision making (see chart for details).  Clinical Course    *I have reviewed nursing notes, vital signs, and all appropriate lab and imaging results for this patient.**  Final Clinical Impressions(s) / ED Diagnoses  The exam favors pharyngitis and URI. Discussed salt water gargles. Discussed hand washing. Rx for amoxil given to the patient. Work excuse given to the patient.   Final diagnoses:  Pharyngitis  URI (upper respiratory infection)    New Prescriptions Discharge Medication List as of 07/14/2016  9:14 PM    START taking these medications   Details  amoxicillin (AMOXIL) 500 MG capsule Take 1 capsule (500 mg total) by mouth 3 (three) times daily., Starting Sun 07/14/2016, Print         Lily Kocher, PA-C 07/15/16 [place], MD 07/17/16 [phone]

## 2016-07-16 ENCOUNTER — Encounter: Payer: Self-pay | Admitting: Adult Health

## 2016-07-24 ENCOUNTER — Ambulatory Visit (INDEPENDENT_AMBULATORY_CARE_PROVIDER_SITE_OTHER): Payer: Medicaid Other | Admitting: Adult Health

## 2016-07-24 ENCOUNTER — Encounter: Payer: Self-pay | Admitting: Adult Health

## 2016-07-24 VITALS — BP 122/58 | HR 72 | Ht 71.0 in | Wt 239.0 lb

## 2016-07-24 DIAGNOSIS — Z319 Encounter for procreative management, unspecified: Secondary | ICD-10-CM

## 2016-07-24 NOTE — Progress Notes (Signed)
Subjective:     Patient ID: Lynn Roberts, female   DOB: Jun 16, 1986, 30 y.o.   MRN: [id number]  HPI Lynn Roberts is a 30 year old white female in to discuss getting pregnant, she had a miscarriage in May, and had IUD removed in February.She has lost about 450 lbs after having duodenal switch at [facility].  Review of Systems Wants to get pregnant Reviewed past medical,surgical, social and family history. Reviewed medications and allergies.     Objective:   Physical Exam BP (!) 122/58 (BP Location: Left Arm, Patient Position: Sitting, Cuff Size: Large)   Pulse 72   Ht 5\' 11"  (1.803 m)   Wt 239 lb (108.4 kg)   LMP 06/16/2016 (Approximate)   BMI 33.33 kg/m Talk only, call with next period or not, if has period will check progesterone level, day 21 of cycle, if not will check QHCG and progesterone level and discussed timing of sex. Face time 10 minutes.    Assessment:     Desires pregnancy     Plan:    Continue taking prenatal vitamins  Call with next period or not  Sex days 7-24 of cycle every other day Pee before and lay there after

## 2016-07-24 NOTE — Patient Instructions (Signed)
Call with next period or not  Sex days 7-24 of cycle every other day Pee before and lay there after

## 2016-07-29 ENCOUNTER — Telehealth: Payer: Self-pay | Admitting: Adult Health

## 2016-07-29 DIAGNOSIS — Z319 Encounter for procreative management, unspecified: Secondary | ICD-10-CM

## 2016-07-29 NOTE — Telephone Encounter (Signed)
Pt started period 8/27 will check progesterone level 9/16, discussed timing of sex again

## 2016-07-29 NOTE — Telephone Encounter (Signed)
Spoke with pt. Pt called to let you know period started yesterday. Thanks!! [place]

## 2016-08-18 LAB — PROGESTERONE: Progesterone: 2.1 ng/mL

## 2016-08-19 ENCOUNTER — Encounter: Payer: Self-pay | Admitting: Adult Health

## 2016-08-19 ENCOUNTER — Other Ambulatory Visit: Payer: Self-pay | Admitting: Adult Health

## 2016-08-19 MED ORDER — CLOMIPHENE CITRATE 50 MG PO TABS: ORAL_TABLET | ORAL | 1 refills | Status: DC

## 2016-10-08 ENCOUNTER — Emergency Department (HOSPITAL_COMMUNITY)
Admission: EM | Admit: 2016-10-08 | Discharge: 2016-10-08 | Disposition: A | Discharge status: Home / Self Care | Payer: Medicaid Other | Attending: Emergency Medicine | Admitting: Emergency Medicine

## 2016-10-08 ENCOUNTER — Encounter (HOSPITAL_COMMUNITY): Payer: Self-pay | Admitting: Emergency Medicine

## 2016-10-08 DIAGNOSIS — H1033 Unspecified acute conjunctivitis, bilateral: Secondary | ICD-10-CM | POA: Diagnosis present

## 2016-10-08 DIAGNOSIS — Z792 Long term (current) use of antibiotics: Secondary | ICD-10-CM | POA: Diagnosis not present

## 2016-10-08 DIAGNOSIS — F17210 Nicotine dependence, cigarettes, uncomplicated: Secondary | ICD-10-CM | POA: Diagnosis not present

## 2016-10-08 DIAGNOSIS — Z79899 Other long term (current) drug therapy: Secondary | ICD-10-CM | POA: Insufficient documentation

## 2016-10-08 MED ORDER — ERYTHROMYCIN 5 MG/GM OP OINT: 1.0000 "application " | TOPICAL_OINTMENT | Freq: Four times a day (QID) | OPHTHALMIC | 1 refills | Status: DC

## 2016-10-08 NOTE — ED Provider Notes (Signed)
[place] DEPT Provider Note   CSN: [id number] Arrival date & time: 10/08/16  [id number]     History   Chief Complaint Chief Complaint  Patient presents with  . Conjunctivitis    HPI Lynn Roberts is a 30 y.o. female.  Lynn Roberts is a 30 y.o. Female who presents to the ED complaining of 3 days of eye redness, itching, watery discharge. She reports having matting to her bilateral eyes in the morning. She reports it began in her right eye is now also on her left. She reports watering and itching of her bilateral eyes. No eye pain. She denies sneezing, nasal congestion or postnasal drip. She does not wear contacts or glasses. She denies fevers, double vision, headaches, eye pain, foreign body senstaion, or rashes.    The history is provided by the patient. No language interpreter was used.  Conjunctivitis  Pertinent negatives include no headaches.    Past Medical History:  Diagnosis Date  . Arthritis of low back   . Complication of anesthesia    is of [name] descent  . GERD (gastroesophageal reflux disease)   . History of PAT (paroxysmal atrial tachycardia)    had cardiac ablation as a teenager  . Migraines   . Miscarriage 04/16/2016  . Obesity   . Papilloma of breast 10/2014   left  . PCOD (polycystic ovarian disease)    no menses  . Supervision of normal pregnancy 04/09/2016    [facility] at   7+1 week FOB  Charlann Noss Dating By  [place]  Pap  04/09/16 GC/CT Initial:                36+wks: Genetic Screen NT/IT:  CF screen  Anatomic [place]  Flu vaccine  Tdap Recommended ~ 28wks Glucose Screen  2 hr GBS  Feed Preference  Contraception  Circumcision  Childbirth Classes  Pediatrician      Patient Active Problem List   Diagnosis Date Noted  . Miscarriage 04/16/2016  . Encounter for IUD removal 02/02/2016  . Papilloma of breast, left 06/08/2014    Past Surgical History:  Procedure Laterality Date  . BREAST LUMPECTOMY Left 2011   papilloma  .  BREAST LUMPECTOMY WITH RADIOACTIVE SEED LOCALIZATION Left 10/18/2014   Procedure:  RADIOACTIVE SEED GUIDED EXCISIONAL BREAST BIOPSY;  Surgeon: Alphonsa Overall, MD;  Location: [place];  Service: General;  Laterality: Left;  . BREAST SURGERY Right 07/27/2004   retro-areolar dissection with exc. of large duct  . CARDIAC ELECTROPHYSIOLOGY STUDY AND ABLATION  age 1  . CHOLECYSTECTOMY  04/27/2008  . GASTROPLASTY DUODENAL SWITCH  01/2014  . OVARIAN CYST REMOVAL Bilateral   . TOENAIL EXCISION    . UNILATERAL SALPINGECTOMY      OB History    Gravida Para Term Preterm AB Living   1       0     SAB TAB Ectopic Multiple Live Births   0     0         Home Medications    Prior to Admission medications   Medication Sig Start Date End Date Taking? Authorizing Provider  acetaminophen (TYLENOL) 500 MG tablet Take 1,000 mg by mouth every 6 (six) hours as needed for mild pain.    Historical Provider, MD  clomiPHENE (CLOMID) 50 MG tablet Take 1 daily, day 3-7 of cycle 08/19/16   Estill Dooms, NP  erythromycin ophthalmic ointment Place 1 application into both eyes every 6 (six)  hours. Place 1/2 inch ribbon of ointment in the affected eye 4 times a day 10/08/16   Waynetta Pean, PA-C  glucose 4 GM chewable tablet Chew 1 tablet by mouth once as needed for low blood sugar.    Historical Provider, MD  Prenatal Vit-Fe Fumarate-FA (MULTIVITAMIN-PRENATAL) 27-0.8 MG TABS tablet Take 1 tablet by mouth daily at 12 noon.    Historical Provider, MD    Family History Family History  Problem Relation Age of Onset  . Cancer Paternal Grandfather   . Cancer Maternal Grandmother   . Diabetes Maternal Grandfather   . Hypertension Father   . Hyperlipidemia Father   . Hypertension Mother   . Diabetes Mother     Social History Social History  Substance Use Topics  . Smoking status: Current Every Day Smoker    Packs/day: 1.00    Years: 10.00    Types: Cigarettes  . Smokeless tobacco: Never  Used  . Alcohol use Yes     Comment: occ.     Allergies   Nsaids; Other; Sulfa antibiotics; and Tramadol   Review of Systems Review of Systems  Constitutional: Negative for fever.  HENT: Negative for congestion, postnasal drip, rhinorrhea, sneezing and sore throat.   Eyes: Positive for redness and itching. Negative for photophobia, pain and visual disturbance.  Skin: Negative for rash.  Neurological: Negative for headaches.     Physical Exam Updated Vital Signs BP 134/78 (BP Location: Left Arm)   Pulse 83   Temp 98.2 F (36.8 C) (Oral)   Resp 18   Ht 5\' 11"  (1.803 m)   Wt 108.9 kg   LMP 10/02/2016   SpO2 100%   BMI 33.47 kg/m   Physical Exam  Constitutional: She appears well-developed and well-nourished. No distress.  HENT:  Head: Normocephalic and atraumatic.  Right Ear: External ear normal.  Left Ear: External ear normal.  Mouth/Throat: Oropharynx is clear and moist.  Eyes: EOM are normal. Pupils are equal, round, and reactive to light. Right eye exhibits discharge. Left eye exhibits discharge.  Bilateral conjunctival injection with watery discharge. EOMs are intact. Vision is grossly intact. No evidence of foreign body on lid exam.   Neck: Normal range of motion.  Pulmonary/Chest: Effort normal. No respiratory distress.  Lymphadenopathy:    She has no cervical adenopathy.  Neurological: She is alert. Coordination normal.  Skin: Skin is warm and dry. Capillary refill takes less than 2 seconds. No rash noted. She is not diaphoretic. No erythema. No pallor.  Psychiatric: She has a normal mood and affect. Her behavior is normal.  Nursing note and vitals reviewed.    ED Treatments / Results  Labs (all labs ordered are listed, but only abnormal results are displayed) Labs Reviewed - No data to display  EKG  EKG Interpretation None       Radiology No results found.  Procedures Procedures (including critical care time)  Medications Ordered in  ED Medications - No data to display   Initial Impression / Assessment and Plan / ED Course  I have reviewed the triage vital signs and the nursing notes.    Visual Acuity  Right Eye Distance: 20/40 Left Eye Distance: 20/30 Bilateral Distance: 20/25  Right Eye Near:   Left Eye Near:    Bilateral Near:      Pertinent labs & imaging results that were available during my care of the patient were reviewed by me and considered in my medical decision making (see chart for details).  Clinical  Course    This  is a 30 y.o. Female who presents to the ED complaining of 3 days of eye redness, itching, watery discharge. She reports having matting to her bilateral eyes in the morning. She reports it began in her right eye is now also on her left. She reports watering and itching of her bilateral eyes. No eye pain. She denies sneezing, nasal congestion or postnasal drip. She does not wear contacts or glasses. On exam the patient is afebrile nontoxic appearing. She has bilateral conjunctival injection with watery discharge. Throat is clear. Visual acuity is 20/25 bilaterally. EOMs are intact. No evidence of foreign body on lid exam. Patient with conjunctivitis. Will start on erythromycin and have her follow-up with ophthalmology if her symptoms persist. I discussed return precautions. I advised the patient to follow-up with their primary care provider this week. I advised the patient to return to the emergency department with new or worsening symptoms or new concerns. The patient verbalized understanding and agreement with plan.    Final Clinical Impressions(s) / ED Diagnoses   Final diagnoses:  Acute conjunctivitis of both eyes, unspecified acute conjunctivitis type    New Prescriptions New Prescriptions   ERYTHROMYCIN OPHTHALMIC OINTMENT    Place 1 application into both eyes every 6 (six) hours. Place 1/2 inch ribbon of ointment in the affected eye 4 times a day     Waynetta Pean, PA-C 10/08/16  1831    Daleen Bo, MD 10/09/16 [id number]

## 2016-10-08 NOTE — ED Triage Notes (Signed)
PT c/o redness and drainage to bilateral eyes starting x5-6 days. PT states left eye was matted with crust present this am.

## 2016-10-18 ENCOUNTER — Emergency Department (HOSPITAL_COMMUNITY): Payer: Medicaid Other

## 2016-10-18 ENCOUNTER — Encounter (HOSPITAL_COMMUNITY): Payer: Self-pay | Admitting: Emergency Medicine

## 2016-10-18 ENCOUNTER — Emergency Department (HOSPITAL_COMMUNITY)
Admission: EM | Admit: 2016-10-18 | Discharge: 2016-10-18 | Disposition: A | Discharge status: Home / Self Care | Payer: Medicaid Other | Attending: Emergency Medicine | Admitting: Emergency Medicine

## 2016-10-18 DIAGNOSIS — R0981 Nasal congestion: Secondary | ICD-10-CM | POA: Diagnosis present

## 2016-10-18 DIAGNOSIS — Z791 Long term (current) use of non-steroidal anti-inflammatories (NSAID): Secondary | ICD-10-CM | POA: Insufficient documentation

## 2016-10-18 DIAGNOSIS — J40 Bronchitis, not specified as acute or chronic: Secondary | ICD-10-CM | POA: Diagnosis not present

## 2016-10-18 DIAGNOSIS — F17210 Nicotine dependence, cigarettes, uncomplicated: Secondary | ICD-10-CM | POA: Diagnosis not present

## 2016-10-18 DIAGNOSIS — J029 Acute pharyngitis, unspecified: Secondary | ICD-10-CM

## 2016-10-18 MED ORDER — ALBUTEROL SULFATE HFA 108 (90 BASE) MCG/ACT IN AERS
2.0000 | INHALATION_SPRAY | Freq: Once | RESPIRATORY_TRACT | Status: AC
  Administered 2016-10-18: 2 via RESPIRATORY_TRACT
  Filled 2016-10-18: qty 6.7

## 2016-10-18 MED ORDER — DOXYCYCLINE HYCLATE 100 MG PO CAPS: 100.0000 mg | ORAL_CAPSULE | Freq: Two times a day (BID) | ORAL | 0 refills | Status: DC

## 2016-10-18 MED ORDER — ACETAMINOPHEN 500 MG PO TABS
1000.0000 mg | ORAL_TABLET | Freq: Once | ORAL | Status: AC
  Administered 2016-10-18: 1000 mg via ORAL
  Filled 2016-10-18: qty 2

## 2016-10-18 MED ORDER — LORATADINE-PSEUDOEPHEDRINE ER 5-120 MG PO TB12: 1.0000 | ORAL_TABLET | Freq: Two times a day (BID) | ORAL | 0 refills | Status: DC

## 2016-10-18 MED ORDER — PREDNISONE 20 MG PO TABS
40.0000 mg | ORAL_TABLET | Freq: Once | ORAL | Status: AC
  Administered 2016-10-18: 40 mg via ORAL
  Filled 2016-10-18: qty 2

## 2016-10-18 MED ORDER — DEXAMETHASONE 4 MG PO TABS: 4.0000 mg | ORAL_TABLET | Freq: Two times a day (BID) | ORAL | 0 refills | Status: DC

## 2016-10-18 MED ORDER — DOXYCYCLINE HYCLATE 100 MG PO TABS
100.0000 mg | ORAL_TABLET | Freq: Once | ORAL | Status: AC
  Administered 2016-10-18: 100 mg via ORAL
  Filled 2016-10-18: qty 1

## 2016-10-18 NOTE — ED Triage Notes (Signed)
PT c/o sore throat, cough, nasal congestion with no fever x1 day. PT states she had tylenol x5 hours ago today.

## 2016-10-18 NOTE — ED Provider Notes (Signed)
[facility] DEPT Provider Note   CSN: [id number] Arrival date & time: 10/18/16  1740     History   Chief Complaint Chief Complaint  Patient presents with  . Sore Throat    HPI Lynn Roberts is a 30 y.o. female.  Patient is a 30 year old female who presents to the emergency department with a complaint of cough and sore throat.  Patient states she's had a cough off and on for several weeks on, she's had congestion and sore throat for a little over 24 hours. She states that this morning the symptoms were particularly difficult to deal with. She feels some better after Tylenol. States she still does not feel good. She denies any blood in the mucus from her nose. She is not coughing up any blood. She's had some chills, but no measured temperature elevation. His been no unusual rash appreciated. She is able to get liquids down. She gets some relief from Tylenol, but this does not resolve the problems.   The history is provided by the patient.  Sore Throat  Associated symptoms include headaches.    Past Medical History:  Diagnosis Date  . Arthritis of low back   . Complication of anesthesia    is of [name] descent  . GERD (gastroesophageal reflux disease)   . History of PAT (paroxysmal atrial tachycardia)    had cardiac ablation as a teenager  . Migraines   . Miscarriage 04/16/2016  . Obesity   . Papilloma of breast 10/2014   left  . PCOD (polycystic ovarian disease)    no menses  . Supervision of normal pregnancy 04/09/2016    [facility] at   7+1 week FOB  Charlann Noss Dating By  [place]  Pap  04/09/16 GC/CT Initial:                36+wks: Genetic Screen NT/IT:  CF screen  Anatomic [place]  Flu vaccine  Tdap Recommended ~ 28wks Glucose Screen  2 hr GBS  Feed Preference  Contraception  Circumcision  Childbirth Classes  Pediatrician      Patient Active Problem List   Diagnosis Date Noted  . Miscarriage 04/16/2016  . Encounter for IUD removal 02/02/2016  .  Papilloma of breast, left 06/08/2014    Past Surgical History:  Procedure Laterality Date  . BREAST LUMPECTOMY Left 2011   papilloma  . BREAST LUMPECTOMY WITH RADIOACTIVE SEED LOCALIZATION Left 10/18/2014   Procedure:  RADIOACTIVE SEED GUIDED EXCISIONAL BREAST BIOPSY;  Surgeon: Alphonsa Overall, MD;  Location: [facility];  Service: General;  Laterality: Left;  . BREAST SURGERY Right 07/27/2004   retro-areolar dissection with exc. of large duct  . CARDIAC ELECTROPHYSIOLOGY STUDY AND ABLATION  age 6  . CHOLECYSTECTOMY  04/27/2008  . GASTROPLASTY DUODENAL SWITCH  01/2014  . OVARIAN CYST REMOVAL Bilateral   . TOENAIL EXCISION    . UNILATERAL SALPINGECTOMY      OB History    Gravida Para Term Preterm AB Living   1       0     SAB TAB Ectopic Multiple Live Births   0     0         Home Medications    Prior to Admission medications   Medication Sig Start Date End Date Taking? Authorizing Provider  acetaminophen (TYLENOL) 500 MG tablet Take 1,000 mg by mouth every 6 (six) hours as needed for mild pain.    Historical Provider, MD  clomiPHENE (CLOMID)  50 MG tablet Take 1 daily, day 3-7 of cycle 08/19/16   Estill Dooms, NP  erythromycin ophthalmic ointment Place 1 application into both eyes every 6 (six) hours. Place 1/2 inch ribbon of ointment in the affected eye 4 times a day 10/08/16   Waynetta Pean, PA-C  glucose 4 GM chewable tablet Chew 1 tablet by mouth once as needed for low blood sugar.    Historical Provider, MD  Prenatal Vit-Fe Fumarate-FA (MULTIVITAMIN-PRENATAL) 27-0.8 MG TABS tablet Take 1 tablet by mouth daily at 12 noon.    Historical Provider, MD    Family History Family History  Problem Relation Age of Onset  . Cancer Paternal Grandfather   . Cancer Maternal Grandmother   . Diabetes Maternal Grandfather   . Hypertension Father   . Hyperlipidemia Father   . Hypertension Mother   . Diabetes Mother     Social History Social History  Substance Use  Topics  . Smoking status: Current Every Day Smoker    Packs/day: 1.00    Years: 10.00    Types: Cigarettes  . Smokeless tobacco: Never Used  . Alcohol use Yes     Comment: occ.     Allergies   Nsaids; Other; Sulfa antibiotics; and Tramadol   Review of Systems Review of Systems  HENT: Positive for congestion, postnasal drip and sore throat.   Respiratory: Positive for cough.   Neurological: Positive for headaches.  All other systems reviewed and are negative.    Physical Exam Updated Vital Signs BP 129/76 (BP Location: Left Arm)   Pulse 76   Temp 97.9 F (36.6 C) (Oral)   Resp 16   Ht 5\' 11"  (1.803 m)   Wt 106.6 kg   LMP 10/02/2016   SpO2 100%   BMI 32.78 kg/m   Physical Exam  Constitutional: She is oriented to person, place, and time. She appears well-developed and well-nourished.  Non-toxic appearance.  HENT:  Head: Normocephalic.  Right Ear: Tympanic membrane and external ear normal.  Left Ear: Tympanic membrane and external ear normal.  Nasal congestion present. There is enlargement of the right tonsil. No exudate noted. Uvula is in the midline. Airway is patent.  Eyes: EOM and lids are normal. Pupils are equal, round, and reactive to light.  Neck: Normal range of motion. Neck supple. Carotid bruit is not present.  Cardiovascular: Normal rate, regular rhythm, normal heart sounds, intact distal pulses and normal pulses.   Pulmonary/Chest: Breath sounds normal. No respiratory distress. She has no wheezes. She has no rales.  Abdominal: Soft. Bowel sounds are normal. There is no tenderness. There is no guarding.  Musculoskeletal: Normal range of motion.  Lymphadenopathy:       Head (right side): No submandibular adenopathy present.       Head (left side): No submandibular adenopathy present.    She has no cervical adenopathy.  Neurological: She is alert and oriented to person, place, and time. She has normal strength. No cranial nerve deficit or sensory deficit.  Coordination normal.  Skin: Skin is warm and dry.  Psychiatric: She has a normal mood and affect. Her speech is normal.  Nursing note and vitals reviewed.    ED Treatments / Results  Labs (all labs ordered are listed, but only abnormal results are displayed) Labs Reviewed - No data to display  EKG  EKG Interpretation None       Radiology No results found.  Procedures Procedures (including critical care time)  Medications Ordered in ED Medications -  No data to display   Initial Impression / Assessment and Plan / ED Course  I have reviewed the triage vital signs and the nursing notes.  Pertinent labs & imaging results that were available during my care of the patient were reviewed by me and considered in my medical decision making (see chart for details).  Clinical Course     **I have reviewed nursing notes, vital signs, and all appropriate lab and imaging results for this patient.*  Final Clinical Impressions(s) / ED Diagnoses  Vital signs within normal limits. Pulse oximetry is 100% on room air. Chest x-ray is negative for acute findings. The patient's examination suggest bronchitis and upper respiratory infection. The patient will be treated with doxycycline, ibuprofen oral, Tylenol, and Decadron. Patient is asked to use the decongesting medication of her choice. She will see her primary Medicaid access physician, or return to the emergency department if any changes or problems.    Final diagnoses:  None    New Prescriptions New Prescriptions   No medications on file     Lily Kocher, PA-C 10/18/16 [place], MD 10/18/16 [id number]

## 2016-10-18 NOTE — Discharge Instructions (Signed)
Your vital signs are within normal limits. Your pulse oximetry is 100% on room air. Your chest x-ray is negative for acute problem. Please use doxycycline and Decadron 2 times daily. Use Tylenol every 4 hours for aching and or fever. Use 2 puffs of albuterol every 4 hours for cough and wheezing. Please increase fluids. Please wash hands frequently.

## 2016-10-26 ENCOUNTER — Other Ambulatory Visit: Payer: Self-pay | Admitting: Adult Health

## 2016-10-28 ENCOUNTER — Emergency Department (HOSPITAL_COMMUNITY)
Admission: EM | Admit: 2016-10-28 | Discharge: 2016-10-28 | Disposition: A | Discharge status: Home / Self Care | Payer: Medicaid Other | Attending: Emergency Medicine | Admitting: Emergency Medicine

## 2016-10-28 ENCOUNTER — Encounter (HOSPITAL_COMMUNITY): Payer: Self-pay | Admitting: Emergency Medicine

## 2016-10-28 ENCOUNTER — Telehealth: Payer: Self-pay | Admitting: *Deleted

## 2016-10-28 ENCOUNTER — Emergency Department (HOSPITAL_COMMUNITY): Payer: Medicaid Other

## 2016-10-28 DIAGNOSIS — R1032 Left lower quadrant pain: Secondary | ICD-10-CM | POA: Diagnosis present

## 2016-10-28 DIAGNOSIS — R102 Pelvic and perineal pain: Secondary | ICD-10-CM | POA: Diagnosis not present

## 2016-10-28 DIAGNOSIS — Z79899 Other long term (current) drug therapy: Secondary | ICD-10-CM | POA: Diagnosis not present

## 2016-10-28 DIAGNOSIS — F17210 Nicotine dependence, cigarettes, uncomplicated: Secondary | ICD-10-CM | POA: Diagnosis not present

## 2016-10-28 LAB — COMPREHENSIVE METABOLIC PANEL
ALT: 11 U/L — ABNORMAL LOW (ref 14–54)
AST: 11 U/L — ABNORMAL LOW (ref 15–41)
Albumin: 3.2 g/dL — ABNORMAL LOW (ref 3.5–5.0)
Alkaline Phosphatase: 35 U/L — ABNORMAL LOW (ref 38–126)
Anion gap: 5 (ref 5–15)
BUN: 11 mg/dL (ref 6–20)
CO2: 27 mmol/L (ref 22–32)
Calcium: 8.3 mg/dL — ABNORMAL LOW (ref 8.9–10.3)
Chloride: 104 mmol/L (ref 101–111)
Creatinine, Ser: 0.4 mg/dL — ABNORMAL LOW (ref 0.44–1.00)
GFR calc Af Amer: >60 mL/min (ref >60)
GFR calc non Af Amer: >60 mL/min (ref >60)
Glucose, Bld: 85 mg/dL (ref 65–99)
Potassium: 3.8 mmol/L (ref 3.5–5.1)
Sodium: 136 mmol/L (ref 135–145)
Total Bilirubin: 0.1 mg/dL — ABNORMAL LOW (ref 0.3–1.2)
Total Protein: 5.9 g/dL — ABNORMAL LOW (ref 6.5–8.1)

## 2016-10-28 LAB — LIPASE, BLOOD: Lipase: 22 U/L (ref 11–51)

## 2016-10-28 LAB — URINALYSIS, ROUTINE W REFLEX MICROSCOPIC
Bilirubin Urine: NEGATIVE
Glucose, UA: NEGATIVE mg/dL
Hgb urine dipstick: NEGATIVE
Ketones, ur: NEGATIVE mg/dL
Leukocytes, UA: NEGATIVE
Nitrite: NEGATIVE
Protein, ur: NEGATIVE mg/dL
Specific Gravity, Urine: 1.015 (ref 1.005–1.030)
pH: 5.5 (ref 5.0–8.0)

## 2016-10-28 LAB — CBC
HCT: 40.1 % (ref 36.0–46.0)
Hemoglobin: 12.9 g/dL (ref 12.0–15.0)
MCH: 26.7 pg (ref 26.0–34.0)
MCHC: 32.2 g/dL (ref 30.0–36.0)
MCV: 82.9 fL (ref 78.0–100.0)
Platelets: 259 10*3/uL (ref 150–400)
RBC: 4.84 MIL/uL (ref 3.87–5.11)
RDW: 14.8 % (ref 11.5–15.5)
WBC: 13.6 10*3/uL — ABNORMAL HIGH (ref 4.0–10.5)

## 2016-10-28 LAB — PREGNANCY, URINE: Preg Test, Ur: NEGATIVE

## 2016-10-28 MED ORDER — HYDROCODONE-ACETAMINOPHEN 5-325 MG PO TABS: 2.0000 | ORAL_TABLET | ORAL | 0 refills | Status: DC | PRN

## 2016-10-28 MED ORDER — HYDROCODONE-ACETAMINOPHEN 5-325 MG PO TABS
2.0000 | ORAL_TABLET | Freq: Once | ORAL | Status: AC
  Administered 2016-10-28: 2 via ORAL
  Filled 2016-10-28: qty 2

## 2016-10-28 NOTE — Discharge Instructions (Signed)
Hydrocodone for pain Follow up with your gynecologist this week - call for appointment in the morning  Please obtain all of your results from medical records or have your doctors office obtain the results - share them with your doctor - you should be seen at your doctors office in the next 2 days. Call today to arrange your follow up. Take the medications as prescribed. Please review all of the medicines and only take them if you do not have an allergy to them. Please be aware that if you are taking birth control pills, taking other prescriptions, ESPECIALLY ANTIBIOTICS may make the birth control ineffective - if this is the case, either do not engage in sexual activity or use alternative methods of birth control such as condoms until you have finished the medicine and your family doctor says it is OK to restart them. If you are on a blood thinner such as COUMADIN, be aware that any other medicine that you take may cause the coumadin to either work too much, or not enough - you should have your coumadin level rechecked in next 7 days if this is the case.  ?  It is also a possibility that you have an allergic reaction to any of the medicines that you have been prescribed - Everybody reacts differently to medications and while MOST people have no trouble with most medicines, you may have a reaction such as nausea, vomiting, rash, swelling, shortness of breath. If this is the case, please stop taking the medicine immediately and contact your physician.  ?  You should return to the ER if you develop severe or worsening symptoms.

## 2016-10-28 NOTE — Telephone Encounter (Signed)
Spoke with pt. Pt is having low abd pain. Has been going on x 2 weeks, off and on. Feels like something twisting. Nausea, tired, lightheaded. I advised she needs to be seen. Pt voiced understanding and call was transferred to front desk for appt. [place]

## 2016-10-28 NOTE — ED Triage Notes (Signed)
Pt reports lower abdominal pain that started 2 weeks ago. Pt describes the pain as "feeling like something is twisting."

## 2016-10-28 NOTE — ED Provider Notes (Signed)
[facility] DEPT Provider Note   CSN: [id number] Arrival date & time: 10/28/16  1348     History   Chief Complaint Chief Complaint  Patient presents with  . Abdominal Pain    HPI Lynn Roberts is a 30 y.o. female.  HPI  The patient is a 30 year old female with a long history of polycystic ovarian syndrome status post cystectomy when she was much younger, she also had a gastric bypass operation and has lost approximately 450 pounds. She reports that since that weight loss she has done very well and has felt quite a bit better. Over the last 1-2 weeks she has developed some intermittent pain in the left lower quadrant, it is a sharp and shooting pain, it comes on with bowel movements but is also they're just intermittently at times. It is not related with eating or position or deep breathing. She has had no urinary symptoms including frequency, dysuria, hematuria. She denies any vaginal discharge. She is currently on Clomid after having a miscarriage in May, she has taken home pregnancy tests and they have been negative. There is no fevers chills nausea or vomiting and the patient is eating normal amounts of food without any difficulty.  Past Medical History:  Diagnosis Date  . Arthritis of low back   . Complication of anesthesia    is of [name] descent  . GERD (gastroesophageal reflux disease)   . History of PAT (paroxysmal atrial tachycardia)    had cardiac ablation as a teenager  . Migraines   . Miscarriage 04/16/2016  . Obesity   . Papilloma of breast 10/2014   left  . PCOD (polycystic ovarian disease)    no menses  . Supervision of normal pregnancy 04/09/2016    [facility] at   7+1 week FOB  Charlann Noss Dating By  [place]  Pap  04/09/16 GC/CT Initial:                36+wks: Genetic Screen NT/IT:  CF screen  Anatomic [place]  Flu vaccine  Tdap Recommended ~ 28wks Glucose Screen  2 hr GBS  Feed Preference  Contraception  Circumcision  Childbirth Classes   Pediatrician      Patient Active Problem List   Diagnosis Date Noted  . Miscarriage 04/16/2016  . Encounter for IUD removal 02/02/2016  . Papilloma of breast, left 06/08/2014    Past Surgical History:  Procedure Laterality Date  . BREAST LUMPECTOMY Left 2011   papilloma  . BREAST LUMPECTOMY WITH RADIOACTIVE SEED LOCALIZATION Left 10/18/2014   Procedure:  RADIOACTIVE SEED GUIDED EXCISIONAL BREAST BIOPSY;  Surgeon: Alphonsa Overall, MD;  Location: [facility];  Service: General;  Laterality: Left;  . BREAST SURGERY Right 07/27/2004   retro-areolar dissection with exc. of large duct  . CARDIAC ELECTROPHYSIOLOGY STUDY AND ABLATION  age 33  . CHOLECYSTECTOMY  04/27/2008  . GASTROPLASTY DUODENAL SWITCH  01/2014  . OVARIAN CYST REMOVAL Bilateral   . TOENAIL EXCISION    . UNILATERAL SALPINGECTOMY      OB History    Gravida Para Term Preterm AB Living   1       0     SAB TAB Ectopic Multiple Live Births   0     0         Home Medications    Prior to Admission medications   Medication Sig Start Date End Date Taking? Authorizing Provider  acetaminophen (TYLENOL) 500 MG tablet Take 1,000 mg by  mouth every 6 (six) hours as needed for mild pain.   Yes Historical Provider, MD  clomiPHENE (CLOMID) 50 MG tablet TAKE ONE TABLET BY MOUTH ONCE DAILY ON  DAYS  3-7  OF  CYCLE 10/28/16  Yes Estill Dooms, NP  glucose 4 GM chewable tablet Chew 1 tablet by mouth once as needed for low blood sugar.   Yes Historical Provider, MD  loratadine-pseudoephedrine (CLARITIN-D 12 HOUR) 5-120 MG tablet Take 1 tablet by mouth 2 (two) times daily. 10/18/16  Yes Lily Kocher, PA-C  Prenatal Vit-Fe Fumarate-FA (MULTIVITAMIN-PRENATAL) 27-0.8 MG TABS tablet Take 1 tablet by mouth daily at 12 noon.   Yes Historical Provider, MD  HYDROcodone-acetaminophen (NORCO/VICODIN) 5-325 MG tablet Take 2 tablets by mouth every 4 (four) hours as needed. 10/28/16   Noemi Chapel, MD    Family History Family  History  Problem Relation Age of Onset  . Cancer Paternal Grandfather   . Cancer Maternal Grandmother   . Diabetes Maternal Grandfather   . Hypertension Father   . Hyperlipidemia Father   . Hypertension Mother   . Diabetes Mother     Social History Social History  Substance Use Topics  . Smoking status: Current Every Day Smoker    Packs/day: 1.00    Years: 10.00    Types: Cigarettes  . Smokeless tobacco: Never Used  . Alcohol use Yes     Comment: occ.     Allergies   Nsaids; Other; Sulfa antibiotics; and Tramadol   Review of Systems Review of Systems  All other systems reviewed and are negative.    Physical Exam Updated Vital Signs BP 122/75   Pulse 76   Temp 98.1 F (36.7 C)   Resp 16   Ht 5\' 11"  (1.803 m)   Wt 242 lb 14.4 oz (110.2 kg)   LMP 10/02/2016   SpO2 98%   BMI 33.88 kg/m   Physical Exam  Constitutional: She appears well-developed and well-nourished. No distress.  HENT:  Head: Normocephalic and atraumatic.  Mouth/Throat: Oropharynx is clear and moist. No oropharyngeal exudate.  Eyes: Conjunctivae and EOM are normal. Pupils are equal, round, and reactive to light. Right eye exhibits no discharge. Left eye exhibits no discharge. No scleral icterus.  Neck: Normal range of motion. Neck supple. No JVD present. No thyromegaly present.  Cardiovascular: Normal rate, regular rhythm, normal heart sounds and intact distal pulses.  Exam reveals no gallop and no friction rub.   No murmur heard. Pulmonary/Chest: Effort normal and breath sounds normal. No respiratory distress. She has no wheezes. She has no rales.  Abdominal: Soft. Bowel sounds are normal. She exhibits no distension and no mass. There is tenderness ( Left lower quadrant and suprapubic tenderness, otherwise the abdomen is totally benign without any tenderness, very soft, no masses palpated).  Musculoskeletal: Normal range of motion. She exhibits no edema or tenderness.  Lymphadenopathy:    She  has no cervical adenopathy.  Neurological: She is alert. Coordination normal.  Skin: Skin is warm and dry. No rash noted. No erythema.  Psychiatric: She has a normal mood and affect. Her behavior is normal.  Nursing note and vitals reviewed.    ED Treatments / Results  Labs (all labs ordered are listed, but only abnormal results are displayed) Labs Reviewed  COMPREHENSIVE METABOLIC PANEL - Abnormal; Notable for the following:       Result Value   Creatinine, Ser 0.40 (*)    Calcium 8.3 (*)    Total Protein 5.9 (*)  Albumin 3.2 (*)    AST 11 (*)    ALT 11 (*)    Alkaline Phosphatase 35 (*)    Total Bilirubin 0.1 (*)    All other components within normal limits  CBC - Abnormal; Notable for the following:    WBC 13.6 (*)    All other components within normal limits  LIPASE, BLOOD  URINALYSIS, ROUTINE W REFLEX MICROSCOPIC (NOT AT [facility])  PREGNANCY, URINE     Radiology US Transvaginal Non-ob  Result Date: 10/28/2016 CLINICAL DATA:  LEFT lower quadrant and pelvic pain for 1 week, history polycystic ovarian syndrome EXAM: TRANSABDOMINAL AND TRANSVAGINAL ULTRASOUND OF PELVIS TECHNIQUE: Both transabdominal and transvaginal ultrasound examinations of the pelvis were performed. Transabdominal technique was performed for global imaging of the pelvis including uterus, ovaries, adnexal regions, and pelvic cul-de-sac. It was necessary to proceed with endovaginal exam following the transabdominal exam to visualize the endometrium and RIGHT ovary. COMPARISON:  OB ultrasound 04/29/2016 FINDINGS: Uterus Measurements: 8.4 x 4.1 x 6.1 cm. Normal morphology without mass Endometrium Thickness: 7 mm thick, normal. Tiny amount of endocervical fluid. No additional endometrial fluid or focal abnormality. Right ovary Measurements: 4.5 x 3.0 x 3.7 cm (volume = 26 cm3). Scattered follicles without mass. Internal blood flow present on color Doppler imaging. Left ovary Measurements: 5.5 x 3.6 x 4.8 cm (volume =  50 cm3). Dominant follicle cyst 3.8 x 2.5 x 3.0 cm. No additional mass. Internal blood flow present on color Doppler imaging. Other findings Trace free pelvic fluid. IMPRESSION: Small simple appearing LEFT ovarian follicle cyst 3.8 x 2.5 x 3.0 cm. Otherwise negative exam. Electronically Signed   By: Lavonia Dana M.D.   On: 10/28/2016 17:18   US Pelvis Complete  Result Date: 10/28/2016 CLINICAL DATA:  LEFT lower quadrant and pelvic pain for 1 week, history polycystic ovarian syndrome EXAM: TRANSABDOMINAL AND TRANSVAGINAL ULTRASOUND OF PELVIS TECHNIQUE: Both transabdominal and transvaginal ultrasound examinations of the pelvis were performed. Transabdominal technique was performed for global imaging of the pelvis including uterus, ovaries, adnexal regions, and pelvic cul-de-sac. It was necessary to proceed with endovaginal exam following the transabdominal exam to visualize the endometrium and RIGHT ovary. COMPARISON:  OB ultrasound 04/29/2016 FINDINGS: Uterus Measurements: 8.4 x 4.1 x 6.1 cm. Normal morphology without mass Endometrium Thickness: 7 mm thick, normal. Tiny amount of endocervical fluid. No additional endometrial fluid or focal abnormality. Right ovary Measurements: 4.5 x 3.0 x 3.7 cm (volume = 26 cm3). Scattered follicles without mass. Internal blood flow present on color Doppler imaging. Left ovary Measurements: 5.5 x 3.6 x 4.8 cm (volume = 50 cm3). Dominant follicle cyst 3.8 x 2.5 x 3.0 cm. No additional mass. Internal blood flow present on color Doppler imaging. Other findings Trace free pelvic fluid. IMPRESSION: Small simple appearing LEFT ovarian follicle cyst 3.8 x 2.5 x 3.0 cm. Otherwise negative exam. Electronically Signed   By: Lavonia Dana M.D.   On: 10/28/2016 17:18    Procedures Procedures (including critical care time)  Medications Ordered in ED Medications - No data to display   Initial Impression / Assessment and Plan / ED Course  I have reviewed the triage vital signs and  the nursing notes.  Pertinent labs & imaging results that were available during my care of the patient were reviewed by me and considered in my medical decision making (see chart for details).  Clinical Course    The patient has multiple soft bowel movements per day, this is per her norm, her exam  is consistent with what could be an ovarian cyst, this could also be diverticulitis, check labs and a pelvic ultrasound, otherwise the patient appears well, doubt perforation or other  surgical process  No UTI, cyst on the L ovary - likely culprit given benign exam [place] reviewed with pg Stable for d/c No nsaids due to prior gastric bypass - has allergy to tramadol 8 vicodin given for home  Final Clinical Impressions(s) / ED Diagnoses   Final diagnoses:  Pain in female pelvis    New Prescriptions New Prescriptions   HYDROCODONE-ACETAMINOPHEN (NORCO/VICODIN) 5-325 MG TABLET    Take 2 tablets by mouth every 4 (four) hours as needed.     Noemi Chapel, MD 10/28/16 [phone]

## 2016-10-29 ENCOUNTER — Ambulatory Visit: Payer: Medicaid Other | Admitting: Women's Health

## 2016-10-29 ENCOUNTER — Telehealth: Payer: Self-pay | Admitting: Women's Health

## 2016-10-29 NOTE — Telephone Encounter (Signed)
Pt called stating that she made an appointment Yesterday to see kim today, but pt went to the ER last night because of the pain. They told her she has a cyst and to follow up with her OBGYN. Pt states should she still come to this appointment or wait a few days. Please contact pt

## 2016-10-31 ENCOUNTER — Encounter: Payer: Self-pay | Admitting: Adult Health

## 2016-11-04 ENCOUNTER — Encounter: Payer: Self-pay | Admitting: Obstetrics & Gynecology

## 2016-11-04 ENCOUNTER — Ambulatory Visit (INDEPENDENT_AMBULATORY_CARE_PROVIDER_SITE_OTHER): Payer: Medicaid Other | Admitting: Obstetrics & Gynecology

## 2016-11-04 VITALS — BP 120/80 | HR 74 | Ht 71.0 in | Wt 232.0 lb

## 2016-11-04 DIAGNOSIS — N83202 Unspecified ovarian cyst, left side: Secondary | ICD-10-CM | POA: Diagnosis not present

## 2016-11-04 DIAGNOSIS — R1032 Left lower quadrant pain: Secondary | ICD-10-CM | POA: Diagnosis not present

## 2016-11-04 DIAGNOSIS — N912 Amenorrhea, unspecified: Secondary | ICD-10-CM

## 2016-11-04 MED ORDER — CLOMIPHENE CITRATE 50 MG PO TABS: ORAL_TABLET | ORAL | 5 refills | Status: DC

## 2016-11-04 NOTE — Progress Notes (Signed)
Chief Complaint  Patient presents with  . Follow-up    Seem ER- cyst rt ovary    Blood pressure 120/80, pulse 74, height 5\' 11"  (1.803 m), weight 232 lb (105.2 kg).  30 y.o. G1P0000 No LMP recorded. The current method of family planning is none.  Outpatient Encounter Prescriptions as of 11/04/2016  Medication Sig  . acetaminophen (TYLENOL) 500 MG tablet Take 1,000 mg by mouth every 6 (six) hours as needed for mild pain.  . clomiPHENE (CLOMID) 50 MG tablet TAKE ONE TABLET BY MOUTH ONCE DAILY ON  DAYS  3-7  OF  CYCLE  . glucose 4 GM chewable tablet Chew 1 tablet by mouth once as needed for low blood sugar.  . Prenatal Vit-Fe Fumarate-FA (MULTIVITAMIN-PRENATAL) 27-0.8 MG TABS tablet Take 1 tablet by mouth daily at 12 noon.  . [DISCONTINUED] clomiPHENE (CLOMID) 50 MG tablet TAKE ONE TABLET BY MOUTH ONCE DAILY ON  DAYS  3-7  OF  CYCLE  . [DISCONTINUED] HYDROcodone-acetaminophen (NORCO/VICODIN) 5-325 MG tablet Take 2 tablets by mouth every 4 (four) hours as needed.  . [DISCONTINUED] loratadine-pseudoephedrine (CLARITIN-D 12 HOUR) 5-120 MG tablet Take 1 tablet by mouth 2 (two) times daily.   No facility-administered encounter medications on file as of 11/04/2016.     Subjective Pt with acute LLQ pain ED visit and sonogram 3.8 cm left ovarian cyst simple  Objective   Pertinent ROS   Labs or studies Transvaginal Non-OB  Study Result   CLINICAL DATA:  LEFT lower quadrant and pelvic pain for 1 week, history polycystic ovarian syndrome  EXAM: TRANSABDOMINAL AND TRANSVAGINAL ULTRASOUND OF PELVIS  TECHNIQUE: Both transabdominal and transvaginal ultrasound examinations of the pelvis were performed. Transabdominal technique was performed for global imaging of the pelvis including uterus, ovaries, adnexal regions, and pelvic cul-de-sac. It was necessary to proceed with endovaginal exam following the transabdominal exam to visualize the endometrium and RIGHT  ovary.  COMPARISON:  OB ultrasound 04/29/2016  FINDINGS: Uterus  Measurements: 8.4 x 4.1 x 6.1 cm. Normal morphology without mass  Endometrium  Thickness: 7 mm thick, normal. Tiny amount of endocervical fluid. No additional endometrial fluid or focal abnormality.  Right ovary  Measurements: 4.5 x 3.0 x 3.7 cm (volume = 26 cm3). Scattered follicles without mass. Internal blood flow present on color Doppler imaging.  Left ovary  Measurements: 5.5 x 3.6 x 4.8 cm (volume = 50 cm3). Dominant follicle cyst 3.8 x 2.5 x 3.0 cm. No additional mass. Internal blood flow present on color Doppler imaging.  Other findings  Trace free pelvic fluid.  IMPRESSION: Small simple appearing LEFT ovarian follicle cyst 3.8 x 2.5 x 3.0 cm.  Otherwise negative exam.       Impression Diagnoses this Encounter::   ICD-9-CM ICD-10-CM   1. Cyst of left ovary 620.2 N83.202   2. Amenorrhea 626.0 N91.2 Beta HCG, Quant     CANCELED: hCG, quantitative, pregnancy    Established relevant diagnosis(es):   Plan/Recommendations: Meds ordered this encounter  Medications  . clomiPHENE (CLOMID) 50 MG tablet    Sig: TAKE ONE TABLET BY MOUTH ONCE DAILY ON  DAYS  3-7  OF  CYCLE    Dispense:  5 tablet    Refill:  5    Please consider 90 day supplies to promote better adherence    Labs or Scans Ordered: Orders Placed This Encounter  Procedures  . Beta HCG, Quant    Management:: Pt wants to start her clomid for ovulation induction  Follow up No Follow-up on file.        Face to face time:  15 minutes  Greater than 50% of the visit time was spent in counseling and coordination of care with the patient.  The summary and outline of the counseling and care coordination is summarized in the note above.   All questions were answered.  Past Medical History:  Diagnosis Date  . Arthritis of low back   . Complication of anesthesia    is of [name] descent  . GERD  (gastroesophageal reflux disease)   . History of PAT (paroxysmal atrial tachycardia)    had cardiac ablation as a teenager  . Migraines   . Miscarriage 04/16/2016  . Obesity   . Papilloma of breast 10/2014   left  . PCOD (polycystic ovarian disease)    no menses  . Supervision of normal pregnancy 04/09/2016    [facility] Initiated Care at   7+1 week FOB  Charlann Noss Dating By  [place]  Pap  04/09/16 GC/CT Initial:                36+wks: Genetic Screen NT/IT:  CF screen  Anatomic [place]  Flu vaccine  Tdap Recommended ~ 28wks Glucose Screen  2 hr GBS  Feed Preference  Contraception  Circumcision  Childbirth Classes  Pediatrician      Past Surgical History:  Procedure Laterality Date  . BREAST LUMPECTOMY Left 2011   papilloma  . BREAST LUMPECTOMY WITH RADIOACTIVE SEED LOCALIZATION Left 10/18/2014   Procedure:  RADIOACTIVE SEED GUIDED EXCISIONAL BREAST BIOPSY;  Surgeon: Alphonsa Overall, MD;  Location: [facility];  Service: General;  Laterality: Left;  . BREAST SURGERY Right 07/27/2004   retro-areolar dissection with exc. of large duct  . CARDIAC ELECTROPHYSIOLOGY STUDY AND ABLATION  age 87  . CHOLECYSTECTOMY  04/27/2008  . GASTROPLASTY DUODENAL SWITCH  01/2014  . OVARIAN CYST REMOVAL Bilateral   . TOENAIL EXCISION    . UNILATERAL SALPINGECTOMY      OB History    Gravida Para Term Preterm AB Living   1       0     SAB TAB Ectopic Multiple Live Births   0     0        Allergies  Allergen Reactions  . Nsaids Other (See Comments)    HX. OF WEIGHT-LOSS SURGERY  . Other Rash    LOTIONS   . Sulfa Antibiotics Rash  . Tramadol Rash    Social History   Social History  . Marital status: Married    Spouse name: N/A  . Number of children: N/A  . Years of education: N/A   Social History Main Topics  . Smoking status: Current Every Day Smoker    Packs/day: 1.00    Years: 10.00    Types: Cigarettes  . Smokeless tobacco: Never Used  . Alcohol use Yes     Comment: occ.   . Drug use: No  . Sexual activity: Yes    Birth control/ protection: None   Other Topics Concern  . None   Social History Narrative  . None    Family History  Problem Relation Age of Onset  . Cancer Paternal Grandfather   . Cancer Maternal Grandmother   . Diabetes Maternal Grandfather   . Hypertension Father   . Hyperlipidemia Father   . Hypertension Mother   . Diabetes Mother

## 2016-11-04 NOTE — Telephone Encounter (Signed)
Did not get in touch with pt to let her know she did not need to keep her apt here.  She is talking with Levy Pupa, LPN via my-chart regarding pain medication.

## 2016-11-05 LAB — BETA HCG QUANT (REF LAB): hCG Quant: <1 m[IU]/mL

## 2017-02-18 ENCOUNTER — Encounter: Payer: Self-pay | Admitting: Adult Health

## 2017-02-19 ENCOUNTER — Other Ambulatory Visit: Payer: Self-pay | Admitting: Adult Health

## 2017-02-19 MED ORDER — CLOMIPHENE CITRATE 50 MG PO TABS: ORAL_TABLET | ORAL | 2 refills | Status: DC

## 2017-05-12 ENCOUNTER — Emergency Department (HOSPITAL_COMMUNITY)
Admission: EM | Admit: 2017-05-12 | Discharge: 2017-05-12 | Disposition: A | Discharge status: Home / Self Care | Payer: 59 | Attending: Emergency Medicine | Admitting: Emergency Medicine

## 2017-05-12 ENCOUNTER — Encounter (HOSPITAL_COMMUNITY): Payer: Self-pay | Admitting: Emergency Medicine

## 2017-05-12 DIAGNOSIS — R350 Frequency of micturition: Secondary | ICD-10-CM | POA: Diagnosis present

## 2017-05-12 DIAGNOSIS — F17210 Nicotine dependence, cigarettes, uncomplicated: Secondary | ICD-10-CM | POA: Diagnosis not present

## 2017-05-12 DIAGNOSIS — N390 Urinary tract infection, site not specified: Secondary | ICD-10-CM | POA: Insufficient documentation

## 2017-05-12 LAB — URINALYSIS, MICROSCOPIC (REFLEX)

## 2017-05-12 LAB — URINALYSIS, ROUTINE W REFLEX MICROSCOPIC
Bilirubin Urine: NEGATIVE
Glucose, UA: NEGATIVE mg/dL
Ketones, ur: NEGATIVE mg/dL
Nitrite: NEGATIVE
Protein, ur: 30 mg/dL — AB
Specific Gravity, Urine: >1.03 — ABNORMAL HIGH (ref 1.005–1.030)
pH: 5.5 (ref 5.0–8.0)

## 2017-05-12 MED ORDER — PHENAZOPYRIDINE HCL 100 MG PO TABS: 100.0000 mg | ORAL_TABLET | Freq: Three times a day (TID) | ORAL | 0 refills | Status: DC | PRN

## 2017-05-12 MED ORDER — CEPHALEXIN 500 MG PO CAPS
500.0000 mg | ORAL_CAPSULE | Freq: Four times a day (QID) | ORAL | 0 refills | Status: DC
Start: 2017-05-12 — End: 2017-06-30

## 2017-05-12 MED ORDER — PHENAZOPYRIDINE HCL 100 MG PO TABS
100.0000 mg | ORAL_TABLET | Freq: Once | ORAL | Status: AC
  Administered 2017-05-12: 100 mg via ORAL
  Filled 2017-05-12: qty 1

## 2017-05-12 MED ORDER — ONDANSETRON HCL 4 MG PO TABS
4.0000 mg | ORAL_TABLET | Freq: Once | ORAL | Status: AC
  Administered 2017-05-12: 4 mg via ORAL
  Filled 2017-05-12: qty 1

## 2017-05-12 MED ORDER — CEPHALEXIN 500 MG PO CAPS
500.0000 mg | ORAL_CAPSULE | Freq: Once | ORAL | Status: AC
  Administered 2017-05-12: 500 mg via ORAL
  Filled 2017-05-12: qty 1

## 2017-05-12 NOTE — ED Provider Notes (Signed)
[facility] DEPT Provider Note   CSN: [id number] Arrival date & time: 05/12/17  1554     History   Chief Complaint Chief Complaint  Patient presents with  . Urinary Frequency    HPI Lynn Roberts is a 31 y.o. female.  Patient is a 31 year old female who presents to the emergency department with a complaint of urine frequency.  The patient states that she has been noticing that she is going to the bathroom almost twice as often as usual. At time she's having some burning sensation. She has not measured any temperature elevation at home. She's not seen any blood in her urine. She has not had any injury to her kidney or bladder area, and she's not had any recent operations or procedures involving her kidney or bladder area. She presents at this time for assistance with this issue.      Past Medical History:  Diagnosis Date  . Arthritis of low back ([place])   . Complication of anesthesia    is of [name] descent  . GERD (gastroesophageal reflux disease)   . History of PAT (paroxysmal atrial tachycardia)    had cardiac ablation as a teenager  . Migraines   . Miscarriage 04/16/2016  . Obesity   . Papilloma of breast 10/2014   left  . PCOD (polycystic ovarian disease)    no menses  . Supervision of normal pregnancy 04/09/2016    [facility] Initiated Care at   7+1 week FOB  Charlann Noss Dating By  [place]  Pap  04/09/16 GC/CT Initial:                36+wks: Genetic Screen NT/IT:  CF screen  Anatomic [place]  Flu vaccine  Tdap Recommended ~ 28wks Glucose Screen  2 hr GBS  Feed Preference  Contraception  Circumcision  Childbirth Classes  Pediatrician      Patient Active Problem List   Diagnosis Date Noted  . Miscarriage 04/16/2016  . Encounter for IUD removal 02/02/2016  . Papilloma of breast, left 06/08/2014    Past Surgical History:  Procedure Laterality Date  . BREAST LUMPECTOMY Left 2011   papilloma  . BREAST LUMPECTOMY WITH RADIOACTIVE SEED LOCALIZATION Left 10/18/2014    Procedure:  RADIOACTIVE SEED GUIDED EXCISIONAL BREAST BIOPSY;  Surgeon: Alphonsa Overall, MD;  Location: [facility];  Service: General;  Laterality: Left;  . BREAST SURGERY Right 07/27/2004   retro-areolar dissection with exc. of large duct  . CARDIAC ELECTROPHYSIOLOGY STUDY AND ABLATION  age 65  . CHOLECYSTECTOMY  04/27/2008  . GASTROPLASTY DUODENAL SWITCH  01/2014  . OVARIAN CYST REMOVAL Bilateral   . TOENAIL EXCISION    . UNILATERAL SALPINGECTOMY      OB History    Gravida Para Term Preterm AB Living   1       0     SAB TAB Ectopic Multiple Live Births   0     0         Home Medications    Prior to Admission medications   Medication Sig Start Date End Date Taking? Authorizing Provider  acetaminophen (TYLENOL) 500 MG tablet Take 1,000 mg by mouth every 6 (six) hours as needed for mild pain.    [provider]  clomiPHENE (CLOMID) 50 MG tablet Take 2  Po days 3-7 of cycle 02/19/17   Derrek Monaco A, NP  glucose 4 GM chewable tablet Chew 1 tablet by mouth once as needed for low blood sugar.  [provider]  Prenatal Vit-Fe Fumarate-FA (MULTIVITAMIN-PRENATAL) 27-0.8 MG TABS tablet Take 1 tablet by mouth daily at 12 noon.    [provider]    Family History Family History  Problem Relation Age of Onset  . Cancer Paternal Grandfather   . Cancer Maternal Grandmother   . Diabetes Maternal Grandfather   . Hypertension Father   . Hyperlipidemia Father   . Hypertension Mother   . Diabetes Mother     Social History Social History  Substance Use Topics  . Smoking status: Current Every Day Smoker    Packs/day: 1.00    Years: 10.00    Types: Cigarettes  . Smokeless tobacco: Never Used  . Alcohol use Yes     Comment: occ.     Allergies   Nsaids; Other; Sulfa antibiotics; and Tramadol   Review of Systems Review of Systems  Constitutional: Negative for activity change, appetite change and fever.  HENT: Negative for  congestion, ear discharge, ear pain, facial swelling, nosebleeds, rhinorrhea, sneezing and tinnitus.   Eyes: Negative for photophobia, pain and discharge.  Respiratory: Negative for cough, choking, shortness of breath and wheezing.   Cardiovascular: Negative for chest pain, palpitations and leg swelling.  Gastrointestinal: Negative for abdominal pain, blood in stool, constipation, diarrhea, nausea and vomiting.  Genitourinary: Positive for dysuria and frequency. Negative for difficulty urinating, flank pain and hematuria.  Musculoskeletal: Negative for back pain, gait problem, myalgias and neck pain.  Skin: Negative for color change, rash and wound.  Neurological: Negative for dizziness, seizures, syncope, facial asymmetry, speech difficulty, weakness and numbness.  Hematological: Negative for adenopathy. Does not bruise/bleed easily.  Psychiatric/Behavioral: Negative for agitation, confusion, hallucinations, self-injury and suicidal ideas. The patient is not nervous/anxious.      Physical Exam Updated Vital Signs BP 124/78 (BP Location: Right Arm)   Pulse (!) 59   Temp 97.7 F (36.5 C) (Oral)   Resp 17   LMP 05/01/2017   SpO2 100%   Physical Exam  Constitutional: Vital signs are normal. She appears well-developed and well-nourished. She is active.  HENT:  Head: Normocephalic and atraumatic.  Right Ear: Tympanic membrane, external ear and ear canal normal.  Left Ear: Tympanic membrane, external ear and ear canal normal.  Nose: Nose normal.  Mouth/Throat: Uvula is midline, oropharynx is clear and moist and mucous membranes are normal.  Eyes: Conjunctivae, EOM and lids are normal. Pupils are equal, round, and reactive to light.  Neck: Trachea normal, normal range of motion and phonation normal. Neck supple. Carotid bruit is not present.  Cardiovascular: Normal rate, regular rhythm and normal pulses.   Abdominal: Soft. Normal appearance and bowel sounds are normal.  No CVA  tenderness. There is mild to moderate tenderness to palpation over the suprapubic area, otherwise no tenderness to palpation.  There is no tenderness to flexing of the psoasmuscle  Lymphadenopathy:       Head (right side): No submental, no preauricular and no posterior auricular adenopathy present.       Head (left side): No submental, no preauricular and no posterior auricular adenopathy present.    She has no cervical adenopathy.  Neurological: She is alert. She has normal strength. No cranial nerve deficit or sensory deficit. GCS eye subscore is 4. GCS verbal subscore is 5. GCS motor subscore is 6.  Skin: Skin is warm and dry.  Psychiatric: Her speech is normal.     ED Treatments / Results  Labs (all labs ordered are listed, but only abnormal  results are displayed) Labs Reviewed  URINALYSIS, ROUTINE W REFLEX MICROSCOPIC - Abnormal; Notable for the following:       Result Value   Specific Gravity, Urine >1.030 (*)    Hgb urine dipstick MODERATE (*)    Protein, ur 30 (*)    Leukocytes, UA SMALL (*)    All other components within normal limits  URINALYSIS, MICROSCOPIC (REFLEX) - Abnormal; Notable for the following:    Bacteria, UA FEW (*)    Squamous Epithelial / LPF 0-5 (*)    All other components within normal limits    EKG  EKG Interpretation None       Radiology No results found.  Procedures Procedures (including critical care time)  Medications Ordered in ED Medications - No data to display   Initial Impression / Assessment and Plan / ED Course  I have reviewed the triage vital signs and the nursing notes.  Pertinent labs & imaging results that were available during my care of the patient were reviewed by me and considered in my medical decision making (see chart for details).      Final Clinical Impressions(s) / ED Diagnoses MDM Vital signs within normal limits. Pulse oximetry is 100% on room air. Urinalysis is consistent with a urinary tract infection.  A culture has been sent to the lab. The patient will be started on Keflex and Pyridium. The patient will have the urine rechecked in 7-10 days for resolution of this infection. I've instructed the patient to see her primary physician or return to the emergency department immediately if any changes, problems, or concerns. The patient is in agreement with this plan.    Final diagnoses:  Lower urinary tract infectious disease    New Prescriptions New Prescriptions   No medications on file     Annette Stable 05/12/17 1923    Mesner, Corene Cornea, MD 05/14/17 [phone]

## 2017-05-12 NOTE — Discharge Instructions (Signed)
Your examination suggest a urinary tract infection. A culture of your urine has been sent to the lab for additional testing. Please use Keflex with breakfast, lunch, dinner, and at bedtime. Please use Pyridium on daily with food. Please monitor for temperature elevations. Please see your primary physician or the local health department to recheck your urine in 7-10 days for resolution of this infection. Return to the emergency department if any emergent changes, problems, or concerns.

## 2017-05-12 NOTE — ED Triage Notes (Signed)
Pt c/o urinary freq x 1 week. Worse todaylnad

## 2017-05-15 LAB — URINE CULTURE: Culture: 80000 — AB

## 2017-05-16 ENCOUNTER — Telehealth: Payer: Self-pay

## 2017-05-16 NOTE — Telephone Encounter (Signed)
Post ED Visit - Positive Culture Follow-up  Culture report reviewed by antimicrobial stewardship pharmacist:  []  Elenor Quinones, Pharm.D. []  Heide Guile, Pharm.D., BCPS AQ-ID []  Parks Neptune, Pharm.D., BCPS [x]  Alycia Rossetti, Pharm.D., BCPS []  [place], [place].D., BCPS, AAHIVP []  Legrand Como, Pharm.D., BCPS, AAHIVP []  Salome Arnt, PharmD, BCPS []  Dimitri Ped, PharmD, BCPS []  Vincenza Hews, PharmD, BCPS  Positive urine culture Treated with Cephalexin, organism sensitive to the same and no further patient follow-up is required at this time.  Genia Del 05/16/2017, 10:45 AM

## 2017-06-16 ENCOUNTER — Encounter: Payer: Self-pay | Admitting: Adult Health

## 2017-06-30 ENCOUNTER — Encounter: Payer: Self-pay | Admitting: Adult Health

## 2017-06-30 ENCOUNTER — Ambulatory Visit (INDEPENDENT_AMBULATORY_CARE_PROVIDER_SITE_OTHER): Payer: 59 | Admitting: Adult Health

## 2017-06-30 VITALS — BP 122/74 | HR 59 | Ht 71.0 in | Wt 245.0 lb

## 2017-06-30 DIAGNOSIS — R102 Pelvic and perineal pain unspecified side: Secondary | ICD-10-CM

## 2017-06-30 DIAGNOSIS — R350 Frequency of micturition: Secondary | ICD-10-CM

## 2017-06-30 DIAGNOSIS — Z319 Encounter for procreative management, unspecified: Secondary | ICD-10-CM | POA: Diagnosis not present

## 2017-06-30 DIAGNOSIS — Z3202 Encounter for pregnancy test, result negative: Secondary | ICD-10-CM | POA: Diagnosis not present

## 2017-06-30 DIAGNOSIS — N926 Irregular menstruation, unspecified: Secondary | ICD-10-CM

## 2017-06-30 LAB — POCT URINALYSIS DIPSTICK
Blood, UA: NEGATIVE
Glucose, UA: NEGATIVE
Leukocytes, UA: NEGATIVE
Nitrite, UA: NEGATIVE
Protein, UA: NEGATIVE

## 2017-06-30 LAB — POCT URINE PREGNANCY: Preg Test, Ur: NEGATIVE

## 2017-06-30 NOTE — Progress Notes (Signed)
Subjective:     Patient ID: Lynn Roberts, female   DOB: [birth year], 31 y.o.   MRN: [id number]  HPI Lynn Roberts is a 31 year old white female in for having missed 2 periods. Has pelvic pain and and urinary frequency. Was treated for UTI in June in ER, was +Ecoli. She desires pregnancy and has used clomid in the past.   Review of Systems Missed periods Pelvic pain urinary frequency  Reviewed past medical,surgical, social and family history. Reviewed medications and allergies.     Objective:   Physical Exam BP 122/74 (BP Location: Right Arm, Patient Position: Sitting, Cuff Size: Large)   Pulse (!) 59   Ht 5\' 11"  (1.803 m)   Wt 245 lb (111.1 kg)   LMP 04/30/2017 (Exact Date)   BMI 34.17 kg/m UPT negative, urine dipstick negative.Skin warm and dry.Abdomen is soft, mildly tender, no HSM noted.   Will get GYN [place] to assess ovaries and uterus, and also told her about [facility] for Reproductive Medicine.If misses over 3 periods, will Rx provera. Also has questions  About surrogacy, talk with [facility].  Face time 15 minutes with 50 % counseling and coordinating care.  Assessment:     1. Missed periods   2. Pelvic pain   3. Patient desires pregnancy   4. Urinary frequency   5. Pregnancy examination or test, negative result       Plan:    Continue PNV Return in 1 week for GYN [place]  Number given for [facility] [phone]

## 2017-07-09 ENCOUNTER — Other Ambulatory Visit: Payer: Self-pay | Admitting: Adult Health

## 2017-07-09 DIAGNOSIS — R102 Pelvic and perineal pain: Secondary | ICD-10-CM

## 2017-07-10 ENCOUNTER — Ambulatory Visit (INDEPENDENT_AMBULATORY_CARE_PROVIDER_SITE_OTHER): Payer: 59

## 2017-07-10 ENCOUNTER — Other Ambulatory Visit: Payer: Self-pay | Admitting: Adult Health

## 2017-07-10 DIAGNOSIS — N912 Amenorrhea, unspecified: Secondary | ICD-10-CM | POA: Diagnosis not present

## 2017-07-10 DIAGNOSIS — R102 Pelvic and perineal pain: Secondary | ICD-10-CM | POA: Diagnosis not present

## 2017-07-10 NOTE — Progress Notes (Addendum)
[facility] US TA/TV: homogeneous anteverted uterus,wnl, enlarged ovaries w/peripheral located follicles,right ovarian vol 24 ml,left ovarian vol 54 ml,EEC 3.8 mm,no free fluid,ovaries appear mobile,no pain during ultrasound

## 2017-07-14 ENCOUNTER — Encounter: Payer: Self-pay | Admitting: Adult Health

## 2017-07-14 ENCOUNTER — Telehealth: Payer: Self-pay | Admitting: Adult Health

## 2017-07-14 DIAGNOSIS — E282 Polycystic ovarian syndrome: Secondary | ICD-10-CM | POA: Insufficient documentation

## 2017-07-14 HISTORY — DX: Polycystic ovarian syndrome: E28.2

## 2017-07-14 NOTE — Telephone Encounter (Signed)
Pt aware that US showed ?PCOs, go ahead and call [facility] for reproductive medicine for consult.

## 2017-08-12 ENCOUNTER — Encounter: Payer: Self-pay | Admitting: Adult Health

## 2017-08-25 ENCOUNTER — Encounter: Payer: Self-pay | Admitting: Adult Health

## 2017-08-25 ENCOUNTER — Ambulatory Visit (INDEPENDENT_AMBULATORY_CARE_PROVIDER_SITE_OTHER): Payer: 59 | Admitting: Adult Health

## 2017-08-25 VITALS — BP 108/60 | Wt 239.0 lb

## 2017-08-25 DIAGNOSIS — N926 Irregular menstruation, unspecified: Secondary | ICD-10-CM

## 2017-08-25 DIAGNOSIS — O3680X0 Pregnancy with inconclusive fetal viability, not applicable or unspecified: Secondary | ICD-10-CM

## 2017-08-25 DIAGNOSIS — Z3201 Encounter for pregnancy test, result positive: Secondary | ICD-10-CM

## 2017-08-25 DIAGNOSIS — Z3A01 Less than 8 weeks gestation of pregnancy: Secondary | ICD-10-CM

## 2017-08-25 LAB — POCT URINE PREGNANCY: Preg Test, Ur: POSITIVE — AB

## 2017-08-25 MED ORDER — PRENATAL PLUS 27-1 MG PO TABS: 1.0000 | ORAL_TABLET | Freq: Every day | ORAL | 12 refills | Status: DC

## 2017-08-25 NOTE — Progress Notes (Signed)
Subjective:     Patient ID: Lynn Roberts, female   DOB: [birth year], 31 y.o.   MRN: [id number]  HPI Lynn Roberts is a 31 year old white female, married in for UPT, has missed a period, and the period in August was 1 day.She has had about 15+HPT, with first one about 3 weeks ago.She did take clomid.   Review of Systems +missed period with +HPT Reviewed past medical,surgical, social and family history. Reviewed medications and allergies.     Objective:   Physical Exam BP 108/60 (BP Location: Right Arm, Patient Position: Sitting, Cuff Size: Normal)   Wt 239 lb (108.4 kg)   LMP 07/06/2017   BMI 33.33 kg/m UPT +about 7+1 week by [place], with EDD 04/12/18, Skin warm and dry. Neck: mid line trachea, normal thyroid, good ROM, no lymphadenopathy noted. Lungs: clear to ausculation bilaterally. Cardiovascular: regular rate and rhythm.Abdomen is soft and non tender.PHQ 2 score 0.    Assessment:     1. Pregnancy test positive   2. Less than [redacted] weeks gestation of pregnancy   3. Encounter to determine fetal viability of pregnancy, single or unspecified fetus       Plan:     Meds ordered this encounter  Medications  . prenatal vitamin w/FE, FA (PRENATAL 1 + 1) 27-1 MG TABS tablet    Sig: Take 1 tablet by mouth daily at 12 noon.    Dispense:  30 each    Refill:  12    Order Specific Question:   Supervising Provider    Answer:   Florian Buff [[id number]]  Return in 4 days for dating [place] Check QHCg and progesterone level Review handouts on first trimester and by [facility]

## 2017-08-25 NOTE — Patient Instructions (Signed)
First Trimester of Pregnancy The first trimester of pregnancy is from week 1 until the end of week 13 (months 1 through 3). A week after a sperm fertilizes an egg, the egg will implant on the wall of the uterus. This embryo will begin to develop into a baby. Genes from you and your partner will form the baby. The female genes will determine whether the baby will be a boy or a girl. At 6-8 weeks, the eyes and face will be formed, and the heartbeat can be seen on ultrasound. At the end of 12 weeks, all the baby's organs will be formed. Now that you are pregnant, you will want to do everything you can to have a healthy baby. Two of the most important things are to get good prenatal care and to follow your health care provider's instructions. Prenatal care is all the medical care you receive before the baby's birth. This care will help prevent, find, and treat any problems during the pregnancy and childbirth. Body changes during your first trimester Your body goes through many changes during pregnancy. The changes vary from woman to woman.  You may gain or lose a couple of pounds at first.  You may feel sick to your stomach (nauseous) and you may throw up (vomit). If the vomiting is uncontrollable, call your health care provider.  You may tire easily.  You may develop headaches that can be relieved by medicines. All medicines should be approved by your health care provider.  You may urinate more often. Painful urination may mean you have a bladder infection.  You may develop heartburn as a result of your pregnancy.  You may develop constipation because certain hormones are causing the muscles that push stool through your intestines to slow down.  You may develop hemorrhoids or swollen veins (varicose veins).  Your breasts may begin to grow larger and become tender. Your nipples may stick out more, and the tissue that surrounds them (areola) may become darker.  Your gums may bleed and may be  sensitive to brushing and flossing.  Dark spots or blotches (chloasma, mask of pregnancy) may develop on your face. This will likely fade after the baby is born.  Your menstrual periods will stop.  You may have a loss of appetite.  You may develop cravings for certain kinds of food.  You may have changes in your emotions from day to day, such as being excited to be pregnant or being concerned that something may go wrong with the pregnancy and baby.  You may have more vivid and strange dreams.  You may have changes in your hair. These can include thickening of your hair, rapid growth, and changes in texture. Some women also have hair loss during or after pregnancy, or hair that feels dry or thin. Your hair will most likely return to normal after your baby is born.  What to expect at prenatal visits During a routine prenatal visit:  You will be weighed to make sure you and the baby are growing normally.  Your blood pressure will be taken.  Your abdomen will be measured to track your baby's growth.  The fetal heartbeat will be listened to between weeks 10 and 14 of your pregnancy.  Test results from any previous visits will be discussed.  Your health care provider may ask you:  How you are feeling.  If you are feeling the baby move.  If you have had any abnormal symptoms, such as leaking fluid, bleeding, severe headaches,   or abdominal cramping.  If you are using any tobacco products, including cigarettes, chewing tobacco, and electronic cigarettes.  If you have any questions.  Other tests that may be performed during your first trimester include:  Blood tests to find your blood type and to check for the presence of any previous infections. The tests will also be used to check for low iron levels (anemia) and protein on red blood cells (Rh antibodies). Depending on your risk factors, or if you previously had diabetes during pregnancy, you may have tests to check for high blood  sugar that affects pregnant women (gestational diabetes).  Urine tests to check for infections, diabetes, or protein in the urine.  An ultrasound to confirm the proper growth and development of the baby.  Fetal screens for spinal cord problems (spina bifida) and Down syndrome.  HIV (human immunodeficiency virus) testing. Routine prenatal testing includes screening for HIV, unless you choose not to have this test.  You may need other tests to make sure you and the baby are doing well.  Follow these instructions at home: Medicines  Follow your health care provider's instructions regarding medicine use. Specific medicines may be either safe or unsafe to take during pregnancy.  Take a prenatal vitamin that contains at least 600 micrograms (mcg) of folic acid.  If you develop constipation, try taking a stool softener if your health care provider approves. Eating and drinking  Eat a balanced diet that includes fresh fruits and vegetables, whole grains, good sources of protein such as meat, eggs, or tofu, and low-fat dairy. Your health care provider will help you determine the amount of weight gain that is right for you.  Avoid raw meat and uncooked cheese. These carry germs that can cause birth defects in the baby.  Eating four or five small meals rather than three large meals a day may help relieve nausea and vomiting. If you start to feel nauseous, eating a few soda crackers can be helpful. Drinking liquids between meals, instead of during meals, also seems to help ease nausea and vomiting.  Limit foods that are high in fat and processed sugars, such as fried and sweet foods.  To prevent constipation: ? Eat foods that are high in fiber, such as fresh fruits and vegetables, whole grains, and beans. ? Drink enough fluid to keep your urine clear or pale yellow. Activity  Exercise only as directed by your health care provider. Most women can continue their usual exercise routine during  pregnancy. Try to exercise for 30 minutes at least 5 days a week. Exercising will help you: ? Control your weight. ? Stay in shape. ? Be prepared for labor and delivery.  Experiencing pain or cramping in the lower abdomen or lower back is a good sign that you should stop exercising. Check with your health care provider before continuing with normal exercises.  Try to avoid standing for long periods of time. Move your legs often if you must stand in one place for a long time.  Avoid heavy lifting.  Wear low-heeled shoes and practice good posture.  You may continue to have sex unless your health care provider tells you not to. Relieving pain and discomfort  Wear a good support bra to relieve breast tenderness.  Take warm sitz baths to soothe any pain or discomfort caused by hemorrhoids. Use hemorrhoid cream if your health care provider approves.  Rest with your legs elevated if you have leg cramps or low back pain.  If you develop   varicose veins in your legs, wear support hose. Elevate your feet for 15 minutes, 3-4 times a day. Limit salt in your diet. Prenatal care  Schedule your prenatal visits by the twelfth week of pregnancy. They are usually scheduled monthly at first, then more often in the last 2 months before delivery.  Write down your questions. Take them to your prenatal visits.  Keep all your prenatal visits as told by your health care provider. This is important. Safety  Wear your seat belt at all times when driving.  Make a list of emergency phone numbers, including numbers for family, friends, the hospital, and police and fire departments. General instructions  Ask your health care provider for a referral to a local prenatal education class. Begin classes no later than the beginning of month 6 of your pregnancy.  Ask for help if you have counseling or nutritional needs during pregnancy. Your health care provider can offer advice or refer you to specialists for help  with various needs.  Do not use hot tubs, steam rooms, or saunas.  Do not douche or use tampons or scented sanitary pads.  Do not cross your legs for long periods of time.  Avoid cat litter boxes and soil used by cats. These carry germs that can cause birth defects in the baby and possibly loss of the fetus by miscarriage or stillbirth.  Avoid all smoking, herbs, alcohol, and medicines not prescribed by your health care provider. Chemicals in these products affect the formation and growth of the baby.  Do not use any products that contain nicotine or tobacco, such as cigarettes and e-cigarettes. If you need help quitting, ask your health care provider. You may receive counseling support and other resources to help you quit.  Schedule a dentist appointment. At home, brush your teeth with a soft toothbrush and be gentle when you floss. Contact a health care provider if:  You have dizziness.  You have mild pelvic cramps, pelvic pressure, or nagging pain in the abdominal area.  You have persistent nausea, vomiting, or diarrhea.  You have a bad smelling vaginal discharge.  You have pain when you urinate.  You notice increased swelling in your face, hands, legs, or ankles.  You are exposed to fifth disease or chickenpox.  You are exposed to German measles (rubella) and have never had it. Get help right away if:  You have a fever.  You are leaking fluid from your vagina.  You have spotting or bleeding from your vagina.  You have severe abdominal cramping or pain.  You have rapid weight gain or loss.  You vomit blood or material that looks like coffee grounds.  You develop a severe headache.  You have shortness of breath.  You have any kind of trauma, such as from a fall or a car accident. Summary  The first trimester of pregnancy is from week 1 until the end of week 13 (months 1 through 3).  Your body goes through many changes during pregnancy. The changes vary from  woman to woman.  You will have routine prenatal visits. During those visits, your health care provider will examine you, discuss any test results you may have, and talk with you about how you are feeling. This information is not intended to replace advice given to you by your health care provider. Make sure you discuss any questions you have with your health care provider. Document Released: 11/12/2001 Document Revised: 10/30/2016 Document Reviewed: 10/30/2016 Elsevier Interactive Patient Education  2017 [employer]  

## 2017-08-26 LAB — BETA HCG QUANT (REF LAB): hCG Quant: 32117 m[IU]/mL

## 2017-08-26 LAB — PROGESTERONE: Progesterone: 17.1 ng/mL

## 2017-08-29 ENCOUNTER — Ambulatory Visit (INDEPENDENT_AMBULATORY_CARE_PROVIDER_SITE_OTHER): Payer: 59

## 2017-08-29 DIAGNOSIS — O3680X0 Pregnancy with inconclusive fetal viability, not applicable or unspecified: Secondary | ICD-10-CM | POA: Diagnosis not present

## 2017-08-29 DIAGNOSIS — Z3A01 Less than 8 weeks gestation of pregnancy: Secondary | ICD-10-CM | POA: Diagnosis not present

## 2017-08-29 NOTE — Progress Notes (Signed)
[place] 6+2 wks,single IUP w/ys,positive fht 129 bpm,normal ovaries bilat,crl 5.77 mm,EDD 04/22/2018 by [place]

## 2017-09-11 ENCOUNTER — Ambulatory Visit (INDEPENDENT_AMBULATORY_CARE_PROVIDER_SITE_OTHER): Payer: 59 | Admitting: Advanced Practice Midwife

## 2017-09-11 ENCOUNTER — Encounter: Payer: Self-pay | Admitting: Advanced Practice Midwife

## 2017-09-11 ENCOUNTER — Other Ambulatory Visit (HOSPITAL_COMMUNITY): Payer: Self-pay | Admitting: Advanced Practice Midwife

## 2017-09-11 ENCOUNTER — Ambulatory Visit: Payer: 59 | Admitting: *Deleted

## 2017-09-11 VITALS — BP 116/62 | HR 75 | Wt 248.0 lb

## 2017-09-11 DIAGNOSIS — Z3481 Encounter for supervision of other normal pregnancy, first trimester: Secondary | ICD-10-CM | POA: Diagnosis not present

## 2017-09-11 DIAGNOSIS — Z3682 Encounter for antenatal screening for nuchal translucency: Secondary | ICD-10-CM

## 2017-09-11 DIAGNOSIS — Z331 Pregnant state, incidental: Secondary | ICD-10-CM

## 2017-09-11 DIAGNOSIS — Z3A08 8 weeks gestation of pregnancy: Secondary | ICD-10-CM | POA: Diagnosis not present

## 2017-09-11 DIAGNOSIS — Z9884 Bariatric surgery status: Secondary | ICD-10-CM | POA: Insufficient documentation

## 2017-09-11 DIAGNOSIS — Z1389 Encounter for screening for other disorder: Secondary | ICD-10-CM

## 2017-09-11 DIAGNOSIS — Z3480 Encounter for supervision of other normal pregnancy, unspecified trimester: Secondary | ICD-10-CM

## 2017-09-11 DIAGNOSIS — O0990 Supervision of high risk pregnancy, unspecified, unspecified trimester: Secondary | ICD-10-CM | POA: Insufficient documentation

## 2017-09-11 LAB — POCT URINALYSIS DIPSTICK
Blood, UA: NEGATIVE
Glucose, UA: NEGATIVE
Ketones, UA: NEGATIVE
Leukocytes, UA: NEGATIVE
Nitrite, UA: NEGATIVE
Protein, UA: NEGATIVE

## 2017-09-11 MED ORDER — DOXYLAMINE-PYRIDOXINE 10-10 MG PO TBEC: DELAYED_RELEASE_TABLET | ORAL | 3 refills | Status: DC

## 2017-09-11 NOTE — Progress Notes (Signed)
Subjective:    Lynn Roberts is a G2P0010 [redacted]w[redacted]d being seen today for her first obstetrical visit.  Her obstetrical history is significant for early SAB laat year.  Pregnancy history fully reviewed. She had been on clomid for over a year.  Had weight loss surgery in 2014, lost >450lbs!!!  Patient reports nausea.  Vitals:   09/11/17 0931  BP: 116/62  Pulse: 75  Weight: 248 lb (112.5 kg)    HISTORY: OB History  Gravida Para Term Preterm AB Living  2       1    SAB TAB Ectopic Multiple Live Births  1     0      # Outcome Date GA Lbr Len/2nd Weight Sex Delivery Anes PTL Lv  2 Current           1 SAB 04/2016             Past Medical History:  Diagnosis Date  . Arthritis of low back ([place])   . Complication of anesthesia    is of [name] descent  . GERD (gastroesophageal reflux disease)   . History of PAT (paroxysmal atrial tachycardia)    had cardiac ablation as a teenager  . Migraines   . Miscarriage 04/16/2016  . Obesity   . Papilloma of breast 10/2014   left  . PCO (polycystic ovaries) 07/14/2017  . PCOD (polycystic ovarian disease)    no menses  . Supervision of normal pregnancy 04/09/2016    [facility] Care at   7+1 week FOB  Charlann Noss Dating By  [place]  Pap  04/09/16 GC/CT Initial:                36+wks: Genetic Screen NT/IT:  CF screen  Anatomic [place]  Flu vaccine  Tdap Recommended ~ 28wks Glucose Screen  2 hr GBS  Feed Preference  Contraception  Circumcision  Childbirth Classes  Pediatrician     Past Surgical History:  Procedure Laterality Date  . BREAST LUMPECTOMY Left 2011   papilloma  . BREAST LUMPECTOMY WITH RADIOACTIVE SEED LOCALIZATION Left 10/18/2014   Procedure:  RADIOACTIVE SEED GUIDED EXCISIONAL BREAST BIOPSY;  Surgeon: Alphonsa Overall, MD;  Location: [facility];  Service: General;  Laterality: Left;  . BREAST SURGERY Right 07/27/2004   retro-areolar dissection with exc. of large duct  . CARDIAC ELECTROPHYSIOLOGY STUDY AND ABLATION   age 81  . CHOLECYSTECTOMY  04/27/2008  . GASTROPLASTY DUODENAL SWITCH  01/2014  . OVARIAN CYST REMOVAL Bilateral   . TOENAIL EXCISION    . UNILATERAL SALPINGECTOMY     Family History  Problem Relation Age of Onset  . Cancer Paternal Grandfather   . Cancer Maternal Grandmother   . Diabetes Maternal Grandfather   . Hypertension Father   . Hyperlipidemia Father   . Hypertension Mother   . Diabetes Mother      Exam       Pelvic Exam:    Perineum: Normal Perineum   Vulva: normal   Vagina:  normal mucosa, normal discharge, no palpable nodules   Uterus Normal, Gravid, FH: 8     Cervix: normal   Adnexa: Not palpable   Urinary:  urethral meatus normal    System:     Skin: normal coloration and turgor, no rashes    Neurologic: oriented, normal, normal mood   Extremities: normal strength, tone, and muscle mass   HEENT PERRLA   Mouth/Teeth mucous membranes moist, normal dentition   Neck supple  and no masses   Cardiovascular: regular rate and rhythm   Respiratory:  appears well, vitals normal, no respiratory distress, acyanotic   Abdomen: soft, non-tender;  FHR: 160 [place]        The nature of [facility] with multiple MDs and other Advanced Practice Providers was explained to patient; also emphasized that residents, students are part of our team.  Assessment:    Pregnancy: G2P0010 Patient Active Problem List   Diagnosis Date Noted  . Supervision of other normal pregnancy, antepartum 09/11/2017  . H/O bariatric surgery 09/11/2017  . PCO (polycystic ovaries) 07/14/2017  . Patient desires pregnancy 06/30/2017  . Pelvic pain 06/30/2017  . Missed periods 06/30/2017  . Urinary frequency 06/30/2017  . Miscarriage 04/16/2016  . Papilloma of breast, left 06/08/2014        Plan:     Initial labs drawn. Continue prenatal vitamins  rx diclegis, coupon given  Problem list reviewed and updated  Reviewed n/v relief measures and warning s/s  to report  Reviewed recommended weight gain based on pre-gravid BMI  Encouraged well-balanced diet Genetic Screening discussed Integrated Screen: requested.  Ultrasound discussed; fetal survey: requested.  Return in about 4 weeks (around 10/09/2017) for US:NT+1st IT.  CRESENZO-DISHMAN,Carlen Fils 09/11/2017

## 2017-09-11 NOTE — Patient Instructions (Signed)
 First Trimester of Pregnancy The first trimester of pregnancy is from week 1 until the end of week 12 (months 1 through 3). A week after a sperm fertilizes an egg, the egg will implant on the wall of the uterus. This embryo will begin to develop into a baby. Genes from you and your partner are forming the baby. The female genes determine whether the baby is a boy or a girl. At 6-8 weeks, the eyes and face are formed, and the heartbeat can be seen on ultrasound. At the end of 12 weeks, all the baby's organs are formed.  Now that you are pregnant, you will want to do everything you can to have a healthy baby. Two of the most important things are to get good prenatal care and to follow your health care provider's instructions. Prenatal care is all the medical care you receive before the baby's birth. This care will help prevent, find, and treat any problems during the pregnancy and childbirth. BODY CHANGES Your body goes through many changes during pregnancy. The changes vary from woman to woman.   You may gain or lose a couple of pounds at first.  You may feel sick to your stomach (nauseous) and throw up (vomit). If the vomiting is uncontrollable, call your health care provider.  You may tire easily.  You may develop headaches that can be relieved by medicines approved by your health care provider.  You may urinate more often. Painful urination may mean you have a bladder infection.  You may develop heartburn as a result of your pregnancy.  You may develop constipation because certain hormones are causing the muscles that push waste through your intestines to slow down.  You may develop hemorrhoids or swollen, bulging veins (varicose veins).  Your breasts may begin to grow larger and become tender. Your nipples may stick out more, and the tissue that surrounds them (areola) may become darker.  Your gums may bleed and may be sensitive to brushing and flossing.  Dark spots or blotches  (chloasma, mask of pregnancy) may develop on your face. This will likely fade after the baby is born.  Your menstrual periods will stop.  You may have a loss of appetite.  You may develop cravings for certain kinds of food.  You may have changes in your emotions from day to day, such as being excited to be pregnant or being concerned that something may go wrong with the pregnancy and baby.  You may have more vivid and strange dreams.  You may have changes in your hair. These can include thickening of your hair, rapid growth, and changes in texture. Some women also have hair loss during or after pregnancy, or hair that feels dry or thin. Your hair will most likely return to normal after your baby is born. WHAT TO EXPECT AT YOUR PRENATAL VISITS During a routine prenatal visit:  You will be weighed to make sure you and the baby are growing normally.  Your blood pressure will be taken.  Your abdomen will be measured to track your baby's growth.  The fetal heartbeat will be listened to starting around week 10 or 12 of your pregnancy.  Test results from any previous visits will be discussed. Your health care provider may ask you:  How you are feeling.  If you are feeling the baby move.  If you have had any abnormal symptoms, such as leaking fluid, bleeding, severe headaches, or abdominal cramping.  If you have any questions. Other   tests that may be performed during your first trimester include:  Blood tests to find your blood type and to check for the presence of any previous infections. They will also be used to check for low iron levels (anemia) and Rh antibodies. Later in the pregnancy, blood tests for diabetes will be done along with other tests if problems develop.  Urine tests to check for infections, diabetes, or protein in the urine.  An ultrasound to confirm the proper growth and development of the baby.  An amniocentesis to check for possible genetic problems.  Fetal  screens for spina bifida and Down syndrome.  You may need other tests to make sure you and the baby are doing well. HOME CARE INSTRUCTIONS  Medicines  Follow your health care provider's instructions regarding medicine use. Specific medicines may be either safe or unsafe to take during pregnancy.  Take your prenatal vitamins as directed.  If you develop constipation, try taking a stool softener if your health care provider approves. Diet  Eat regular, well-balanced meals. Choose a variety of foods, such as meat or vegetable-based protein, fish, milk and low-fat dairy products, vegetables, fruits, and whole grain breads and cereals. Your health care provider will help you determine the amount of weight gain that is right for you.  Avoid raw meat and uncooked cheese. These carry germs that can cause birth defects in the baby.  Eating four or five small meals rather than three large meals a day may help relieve nausea and vomiting. If you start to feel nauseous, eating a few soda crackers can be helpful. Drinking liquids between meals instead of during meals also seems to help nausea and vomiting.  If you develop constipation, eat more high-fiber foods, such as fresh vegetables or fruit and whole grains. Drink enough fluids to keep your urine clear or pale yellow. Activity and Exercise  Exercise only as directed by your health care provider. Exercising will help you:  Control your weight.  Stay in shape.  Be prepared for labor and delivery.  Experiencing pain or cramping in the lower abdomen or low back is a good sign that you should stop exercising. Check with your health care provider before continuing normal exercises.  Try to avoid standing for long periods of time. Move your legs often if you must stand in one place for a long time.  Avoid heavy lifting.  Wear low-heeled shoes, and practice good posture.  You may continue to have sex unless your health care provider directs you  otherwise. Relief of Pain or Discomfort  Wear a good support bra for breast tenderness.   Take warm sitz baths to soothe any pain or discomfort caused by hemorrhoids. Use hemorrhoid cream if your health care provider approves.   Rest with your legs elevated if you have leg cramps or low back pain.  If you develop varicose veins in your legs, wear support hose. Elevate your feet for 15 minutes, 3-4 times a day. Limit salt in your diet. Prenatal Care  Schedule your prenatal visits by the twelfth week of pregnancy. They are usually scheduled monthly at first, then more often in the last 2 months before delivery.  Write down your questions. Take them to your prenatal visits.  Keep all your prenatal visits as directed by your health care provider. Safety  Wear your seat belt at all times when driving.  Make a list of emergency phone numbers, including numbers for family, friends, the hospital, and police and fire departments. General   Tips  Ask your health care provider for a referral to a local prenatal education class. Begin classes no later than at the beginning of month 6 of your pregnancy.  Ask for help if you have counseling or nutritional needs during pregnancy. Your health care provider can offer advice or refer you to specialists for help with various needs.  Do not use hot tubs, steam rooms, or saunas.  Do not douche or use tampons or scented sanitary pads.  Do not cross your legs for long periods of time.  Avoid cat litter boxes and soil used by cats. These carry germs that can cause birth defects in the baby and possibly loss of the fetus by miscarriage or stillbirth.  Avoid all smoking, herbs, alcohol, and medicines not prescribed by your health care provider. Chemicals in these affect the formation and growth of the baby.  Schedule a dentist appointment. At home, brush your teeth with a soft toothbrush and be gentle when you floss. SEEK MEDICAL CARE IF:   You have  dizziness.  You have mild pelvic cramps, pelvic pressure, or nagging pain in the abdominal area.  You have persistent nausea, vomiting, or diarrhea.  You have a bad smelling vaginal discharge.  You have pain with urination.  You notice increased swelling in your face, hands, legs, or ankles. SEEK IMMEDIATE MEDICAL CARE IF:   You have a fever.  You are leaking fluid from your vagina.  You have spotting or bleeding from your vagina.  You have severe abdominal cramping or pain.  You have rapid weight gain or loss.  You vomit blood or material that looks like coffee grounds.  You are exposed to German measles and have never had them.  You are exposed to fifth disease or chickenpox.  You develop a severe headache.  You have shortness of breath.  You have any kind of trauma, such as from a fall or a car accident. Document Released: 11/12/2001 Document Revised: 04/04/2014 Document Reviewed: 09/28/2013 [employer] Patient Information 2015 [employer]. This information is not intended to replace advice given to you by your health care provider. Make sure you discuss any questions you have with your health care provider.   Nausea & Vomiting  Have saltine crackers or pretzels by your bed and eat a few bites before you raise your head out of bed in the morning  Eat small frequent meals throughout the day instead of large meals  Drink plenty of fluids throughout the day to stay hydrated, just don't drink a lot of fluids with your meals.  This can make your stomach fill up faster making you feel sick  Do not brush your teeth right after you eat  Products with real ginger are good for nausea, like ginger ale and ginger hard candy Make sure it says made with real ginger!  Sucking on sour candy like lemon heads is also good for nausea  If your prenatal vitamins make you nauseated, take them at night so you will sleep through the nausea  Sea Bands  If you feel like you need  medicine for the nausea & vomiting please let us know  If you are unable to keep any fluids or food down please let us know   Constipation  Drink plenty of fluid, preferably water, throughout the day  Eat foods high in fiber such as fruits, vegetables, and grains  Exercise, such as walking, is a good way to keep your bowels regular  Drink warm fluids, especially warm   prune juice, or decaf coffee  Eat a 1/2 cup of real oatmeal (not instant), 1/2 cup applesauce, and 1/2-1 cup warm prune juice every day  If needed, you may take Colace (docusate sodium) stool softener once or twice a day to help keep the stool soft. If you are pregnant, wait until you are out of your first trimester (12-14 weeks of pregnancy)  If you still are having problems with constipation, you may take Miralax once daily as needed to help keep your bowels regular.  If you are pregnant, wait until you are out of your first trimester (12-14 weeks of pregnancy)  Safe Medications in Pregnancy   Acne: Benzoyl Peroxide Salicylic Acid  Backache/Headache: Tylenol: 2 regular strength every 4 hours OR              2 Extra strength every 6 hours  Colds/Coughs/Allergies: Benadryl (alcohol free) 25 mg every 6 hours as needed Breath right strips Claritin Cepacol throat lozenges Chloraseptic throat spray Cold-Eeze- up to three times per day Cough drops, alcohol free Flonase (by prescription only) Guaifenesin Mucinex Robitussin DM (plain only, alcohol free) Saline nasal spray/drops Sudafed (pseudoephedrine) & Actifed ** use only after [redacted] weeks gestation and if you do not have high blood pressure Tylenol Vicks Vaporub Zinc lozenges Zyrtec   Constipation: Colace Ducolax suppositories Fleet enema Glycerin suppositories Metamucil Milk of magnesia Miralax Senokot Smooth move tea  Diarrhea: Kaopectate Imodium A-D  *NO pepto Bismol  Hemorrhoids: Anusol Anusol HC Preparation  H Tucks  Indigestion: Tums Maalox Mylanta Zantac  Pepcid  Insomnia: Benadryl (alcohol free) 25mg every 6 hours as needed Tylenol PM Unisom, no Gelcaps  Leg Cramps: Tums MagGel  Nausea/Vomiting:  Bonine Dramamine Emetrol Ginger extract Sea bands Meclizine  Nausea medication to take during pregnancy:  Unisom (doxylamine succinate 25 mg tablets) Take one tablet daily at bedtime. If symptoms are not adequately controlled, the dose can be increased to a maximum recommended dose of two tablets daily (1/2 tablet in the morning, 1/2 tablet mid-afternoon and one at bedtime). Vitamin B6 100mg tablets. Take one tablet twice a day (up to 200 mg per day).  Skin Rashes: Aveeno products Benadryl cream or 25mg every 6 hours as needed Calamine Lotion 1% cortisone cream  Yeast infection: Gyne-lotrimin 7 Monistat 7   **If taking multiple medications, please check labels to avoid duplicating the same active ingredients **take medication as directed on the label ** Do not exceed 4000 mg of tylenol in 24 hours **Do not take medications that contain aspirin or ibuprofen      

## 2017-09-12 LAB — PMP SCREEN PROFILE (10S), URINE
Amphetamine Scrn, Ur: NEGATIVE ng/mL
BARBITURATE SCREEN URINE: NEGATIVE ng/mL
BENZODIAZEPINE SCREEN, URINE: NEGATIVE ng/mL
CANNABINOIDS UR QL SCN: NEGATIVE ng/mL
Cocaine (Metab) Scrn, Ur: NEGATIVE ng/mL
Creatinine(Crt), U: 76.2 mg/dL (ref 20.0–300.0)
Methadone Screen, Urine: NEGATIVE ng/mL
OXYCODONE+OXYMORPHONE UR QL SCN: NEGATIVE ng/mL
Opiate Scrn, Ur: NEGATIVE ng/mL
Ph of Urine: 6.8 (ref 4.5–8.9)
Phencyclidine Qn, Ur: NEGATIVE ng/mL
Propoxyphene Scrn, Ur: NEGATIVE ng/mL

## 2017-09-13 LAB — GC/CHLAMYDIA PROBE AMP
Chlamydia trachomatis, NAA: NEGATIVE
Neisseria gonorrhoeae by PCR: NEGATIVE

## 2017-09-13 LAB — URINE CULTURE

## 2017-09-16 ENCOUNTER — Encounter: Payer: Self-pay | Admitting: Advanced Practice Midwife

## 2017-09-16 ENCOUNTER — Other Ambulatory Visit: Payer: Self-pay | Admitting: Adult Health

## 2017-09-16 MED ORDER — CEPHALEXIN 500 MG PO CAPS: 500.0000 mg | ORAL_CAPSULE | Freq: Four times a day (QID) | ORAL | 0 refills | Status: DC

## 2017-09-16 NOTE — Progress Notes (Signed)
Will rx keflex for UTI

## 2017-09-19 LAB — MICROSCOPIC EXAMINATION: Casts: NONE SEEN /lpf

## 2017-09-19 LAB — CBC
Hematocrit: 35 % (ref 34.0–46.6)
Hemoglobin: 11.6 g/dL (ref 11.1–15.9)
MCH: 26.5 pg — ABNORMAL LOW (ref 26.6–33.0)
MCHC: 33.1 g/dL (ref 31.5–35.7)
MCV: 80 fL (ref 79–97)
Platelets: 255 10*3/uL (ref 150–379)
RBC: 4.37 x10E6/uL (ref 3.77–5.28)
RDW: 14.9 % (ref 12.3–15.4)
WBC: 7.5 10*3/uL (ref 3.4–10.8)

## 2017-09-19 LAB — ABO/RH: Rh Factor: POSITIVE

## 2017-09-19 LAB — URINALYSIS, ROUTINE W REFLEX MICROSCOPIC
Bilirubin, UA: NEGATIVE
Glucose, UA: NEGATIVE
Ketones, UA: NEGATIVE
Nitrite, UA: POSITIVE — AB
Protein, UA: NEGATIVE
RBC, UA: NEGATIVE
Specific Gravity, UA: 1.019 (ref 1.005–1.030)
Urobilinogen, Ur: 1 mg/dL (ref 0.2–1.0)
pH, UA: 7 (ref 5.0–7.5)

## 2017-09-19 LAB — RUBELLA SCREEN: Rubella Antibodies, IGG: 4.16 index (ref >0.99)

## 2017-09-19 LAB — ANTIBODY SCREEN: Antibody Screen: NEGATIVE

## 2017-09-19 LAB — HIV ANTIBODY (ROUTINE TESTING W REFLEX): HIV Screen 4th Generation wRfx: NONREACTIVE

## 2017-09-19 LAB — CYSTIC FIBROSIS MUTATION 97: Interpretation: NOT DETECTED

## 2017-09-19 LAB — RPR: RPR Ser Ql: NONREACTIVE

## 2017-09-19 LAB — HEPATITIS B SURFACE ANTIGEN: Hepatitis B Surface Ag: NEGATIVE

## 2017-09-19 LAB — VARICELLA ZOSTER ANTIBODY, IGG: Varicella zoster IgG: 2958 index (ref >165)

## 2017-10-09 ENCOUNTER — Ambulatory Visit (INDEPENDENT_AMBULATORY_CARE_PROVIDER_SITE_OTHER): Payer: 59 | Admitting: Obstetrics & Gynecology

## 2017-10-09 ENCOUNTER — Ambulatory Visit (INDEPENDENT_AMBULATORY_CARE_PROVIDER_SITE_OTHER): Payer: 59

## 2017-10-09 ENCOUNTER — Encounter: Payer: Self-pay | Admitting: Obstetrics & Gynecology

## 2017-10-09 VITALS — BP 118/64 | HR 66 | Wt 248.0 lb

## 2017-10-09 DIAGNOSIS — Z331 Pregnant state, incidental: Secondary | ICD-10-CM

## 2017-10-09 DIAGNOSIS — Z9884 Bariatric surgery status: Secondary | ICD-10-CM

## 2017-10-09 DIAGNOSIS — Z3480 Encounter for supervision of other normal pregnancy, unspecified trimester: Secondary | ICD-10-CM

## 2017-10-09 DIAGNOSIS — Z3682 Encounter for antenatal screening for nuchal translucency: Secondary | ICD-10-CM

## 2017-10-09 DIAGNOSIS — Z1389 Encounter for screening for other disorder: Secondary | ICD-10-CM

## 2017-10-09 DIAGNOSIS — Z3A12 12 weeks gestation of pregnancy: Secondary | ICD-10-CM

## 2017-10-09 LAB — POCT URINALYSIS DIPSTICK
Blood, UA: NEGATIVE
Glucose, UA: NEGATIVE
Ketones, UA: NEGATIVE
Leukocytes, UA: NEGATIVE
Nitrite, UA: NEGATIVE
Protein, UA: NEGATIVE

## 2017-10-09 NOTE — Progress Notes (Signed)
[place] 12+1 wks,measurements c/w dates,normal ovaries bilat,crl 53.8 mm,NB present,NT 1.5 mm,fhr 148 bpm,ant pl gr 0

## 2017-10-09 NOTE — Progress Notes (Signed)
G2P0010 [redacted]w[redacted]d Estimated Date of Delivery: 04/22/18  Blood pressure 118/64, pulse 66, weight 248 lb (112.5 kg), last menstrual period 07/06/2017.   BP weight and urine results all reviewed and noted.  Please refer to the obstetrical flow sheet for the fundal height and fetal heart rate documentation:  Patient reports good fetal movement, denies any bleeding and no rupture of membranes symptoms or regular contractions. Patient is without complaints. All questions were answered.  Orders Placed This Encounter  Procedures  . Integrated 1  . POCT urinalysis dipstick    Plan:  Continued routine obstetrical care,   Return in about 4 weeks (around 11/06/2017) for 2nd IT, HROB.

## 2017-10-11 LAB — INTEGRATED 1
Crown Rump Length: 53.8 mm
Gest. Age on Collection Date: 12 weeks
Maternal Age at EDD: 34 yr
Nuchal Translucency (NT): 1.5 mm
Number of Fetuses: 1
PAPP-A Value: 546.4 ng/mL
Weight: 248 [lb_av]

## 2017-10-28 ENCOUNTER — Telehealth: Payer: Self-pay | Admitting: *Deleted

## 2017-10-28 NOTE — Telephone Encounter (Signed)
Patient called stating she is at work and suddenly got hot and had a sharp shooting pain in her lower abdomen. She is not having any bleeding or cramping at this time. Advised patient to continue to monitor for cramping, bleeding, etc. Informed patient she could have had a drop in her blood sugar or blood pressure which caused her to get hot. Advised to push fluids and eat small frequent meals. Pt verbalized understanding.

## 2017-11-05 ENCOUNTER — Ambulatory Visit (INDEPENDENT_AMBULATORY_CARE_PROVIDER_SITE_OTHER): Payer: 59 | Admitting: Advanced Practice Midwife

## 2017-11-05 VITALS — BP 102/58 | HR 70 | Wt 254.0 lb

## 2017-11-05 DIAGNOSIS — Z1389 Encounter for screening for other disorder: Secondary | ICD-10-CM

## 2017-11-05 DIAGNOSIS — Z3402 Encounter for supervision of normal first pregnancy, second trimester: Secondary | ICD-10-CM

## 2017-11-05 DIAGNOSIS — E282 Polycystic ovarian syndrome: Secondary | ICD-10-CM

## 2017-11-05 DIAGNOSIS — Z331 Pregnant state, incidental: Secondary | ICD-10-CM

## 2017-11-05 DIAGNOSIS — Z1379 Encounter for other screening for genetic and chromosomal anomalies: Secondary | ICD-10-CM

## 2017-11-05 DIAGNOSIS — Z833 Family history of diabetes mellitus: Secondary | ICD-10-CM

## 2017-11-05 DIAGNOSIS — Z363 Encounter for antenatal screening for malformations: Secondary | ICD-10-CM

## 2017-11-05 LAB — POCT URINALYSIS DIPSTICK
Blood, UA: NEGATIVE
Glucose, UA: NEGATIVE
Ketones, UA: NEGATIVE
Leukocytes, UA: NEGATIVE
Nitrite, UA: NEGATIVE
Protein, UA: NEGATIVE

## 2017-11-05 NOTE — Patient Instructions (Addendum)
Lynn Roberts, I greatly value your feedback.  If you receive a survey following your visit with [place] today, we appreciate you taking the time to fill it out.  Thanks, Nigel Berthold, CNM  Try an emmollient (such as Eucerine) or similar, something for eczema or psoriasis    Second Trimester of Pregnancy The second trimester is from week 14 through week 27 (months 4 through 6). The second trimester is often a time when you feel your best. Your body has adjusted to being pregnant, and you begin to feel better physically. Usually, morning sickness has lessened or quit completely, you may have more energy, and you may have an increase in appetite. The second trimester is also a time when the fetus is growing rapidly. At the end of the sixth month, the fetus is about 9 inches long and weighs about 1 pounds. You will likely begin to feel the baby move (quickening) between 16 and 20 weeks of pregnancy. Body changes during your second trimester Your body continues to go through many changes during your second trimester. The changes vary from woman to woman.  Your weight will continue to increase. You will notice your lower abdomen bulging out.  You may begin to get stretch marks on your hips, abdomen, and breasts.  You may develop headaches that can be relieved by medicines. The medicines should be approved by your health care provider.  You may urinate more often because the fetus is pressing on your bladder.  You may develop or continue to have heartburn as a result of your pregnancy.  You may develop constipation because certain hormones are causing the muscles that push waste through your intestines to slow down.  You may develop hemorrhoids or swollen, bulging veins (varicose veins).  You may have back pain. This is caused by: ? Weight gain. ? Pregnancy hormones that are relaxing the joints in your pelvis. ? A shift in weight and the muscles that support your balance.  Your  breasts will continue to grow and they will continue to become tender.  Your gums may bleed and may be sensitive to brushing and flossing.  Dark spots or blotches (chloasma, mask of pregnancy) may develop on your face. This will likely fade after the baby is born.  A dark line from your belly button to the pubic area (linea nigra) may appear. This will likely fade after the baby is born.  You may have changes in your hair. These can include thickening of your hair, rapid growth, and changes in texture. Some women also have hair loss during or after pregnancy, or hair that feels dry or thin. Your hair will most likely return to normal after your baby is born.  What to expect at prenatal visits During a routine prenatal visit:  You will be weighed to make sure you and the fetus are growing normally.  Your blood pressure will be taken.  Your abdomen will be measured to track your baby's growth.  The fetal heartbeat will be listened to.  Any test results from the previous visit will be discussed.  Your health care provider may ask you:  How you are feeling.  If you are feeling the baby move.  If you have had any abnormal symptoms, such as leaking fluid, bleeding, severe headaches, or abdominal cramping.  If you are using any tobacco products, including cigarettes, chewing tobacco, and electronic cigarettes.  If you have any questions.  Other tests that may be performed during your second trimester  include:  Blood tests that check for: ? Low iron levels (anemia). ? High blood sugar that affects pregnant women (gestational diabetes) between 21 and 28 weeks. ? Rh antibodies. This is to check for a protein on red blood cells (Rh factor).  Urine tests to check for infections, diabetes, or protein in the urine.  An ultrasound to confirm the proper growth and development of the baby.  An amniocentesis to check for possible genetic problems.  Fetal screens for spina bifida and  Down syndrome.  HIV (human immunodeficiency virus) testing. Routine prenatal testing includes screening for HIV, unless you choose not to have this test.  Follow these instructions at home: Medicines  Follow your health care provider's instructions regarding medicine use. Specific medicines may be either safe or unsafe to take during pregnancy.  Take a prenatal vitamin that contains at least 600 micrograms (mcg) of folic acid.  If you develop constipation, try taking a stool softener if your health care provider approves. Eating and drinking  Eat a balanced diet that includes fresh fruits and vegetables, whole grains, good sources of protein such as meat, eggs, or tofu, and low-fat dairy. Your health care provider will help you determine the amount of weight gain that is right for you.  Avoid raw meat and uncooked cheese. These carry germs that can cause birth defects in the baby.  If you have low calcium intake from food, talk to your health care provider about whether you should take a daily calcium supplement.  Limit foods that are high in fat and processed sugars, such as fried and sweet foods.  To prevent constipation: ? Drink enough fluid to keep your urine clear or pale yellow. ? Eat foods that are high in fiber, such as fresh fruits and vegetables, whole grains, and beans. Activity  Exercise only as directed by your health care provider. Most women can continue their usual exercise routine during pregnancy. Try to exercise for 30 minutes at least 5 days a week. Stop exercising if you experience uterine contractions.  Avoid heavy lifting, wear low heel shoes, and practice good posture.  A sexual relationship may be continued unless your health care provider directs you otherwise. Relieving pain and discomfort  Wear a good support bra to prevent discomfort from breast tenderness.  Take warm sitz baths to soothe any pain or discomfort caused by hemorrhoids. Use hemorrhoid  cream if your health care provider approves.  Rest with your legs elevated if you have leg cramps or low back pain.  If you develop varicose veins, wear support hose. Elevate your feet for 15 minutes, 3-4 times a day. Limit salt in your diet. Prenatal Care  Write down your questions. Take them to your prenatal visits.  Keep all your prenatal visits as told by your health care provider. This is important. Safety  Wear your seat belt at all times when driving.  Make a list of emergency phone numbers, including numbers for family, friends, the hospital, and police and fire departments. General instructions  Ask your health care provider for a referral to a local prenatal education class. Begin classes no later than the beginning of month 6 of your pregnancy.  Ask for help if you have counseling or nutritional needs during pregnancy. Your health care provider can offer advice or refer you to specialists for help with various needs.  Do not use hot tubs, steam rooms, or saunas.  Do not douche or use tampons or scented sanitary pads.  Do not cross  your legs for long periods of time.  Avoid cat litter boxes and soil used by cats. These carry germs that can cause birth defects in the baby and possibly loss of the fetus by miscarriage or stillbirth.  Avoid all smoking, herbs, alcohol, and unprescribed drugs. Chemicals in these products can affect the formation and growth of the baby.  Do not use any products that contain nicotine or tobacco, such as cigarettes and e-cigarettes. If you need help quitting, ask your health care provider.  Visit your dentist if you have not gone yet during your pregnancy. Use a soft toothbrush to brush your teeth and be gentle when you floss. Contact a health care provider if:  You have dizziness.  You have mild pelvic cramps, pelvic pressure, or nagging pain in the abdominal area.  You have persistent nausea, vomiting, or diarrhea.  You have a bad  smelling vaginal discharge.  You have pain when you urinate. Get help right away if:  You have a fever.  You are leaking fluid from your vagina.  You have spotting or bleeding from your vagina.  You have severe abdominal cramping or pain.  You have rapid weight gain or weight loss.  You have shortness of breath with chest pain.  You notice sudden or extreme swelling of your face, hands, ankles, feet, or legs.  You have not felt your baby move in over an hour.  You have severe headaches that do not go away when you take medicine.  You have vision changes. Summary  The second trimester is from week 14 through week 27 (months 4 through 6). It is also a time when the fetus is growing rapidly.  Your body goes through many changes during pregnancy. The changes vary from woman to woman.  Avoid all smoking, herbs, alcohol, and unprescribed drugs. These chemicals affect the formation and growth your baby.  Do not use any tobacco products, such as cigarettes, chewing tobacco, and e-cigarettes. If you need help quitting, ask your health care provider.  Contact your health care provider if you have any questions. Keep all prenatal visits as told by your health care provider. This is important. This information is not intended to replace advice given to you by your health care provider. Make sure you discuss any questions you have with your health care provider.      CHILDBIRTH CLASSES [phone] is the phone number for Pregnancy Classes or hospital tours at [place] will be referred to  [email or web address] for more information on childbirth classes  At this site you may register for classes. You may sign up for a waiting list if classes are full. Please SIGN UP FOR THIS!.   When the waiting list becomes long, sometimes new classes can be added.

## 2017-11-05 NOTE — Progress Notes (Signed)
LOW-RISK PREGNANCY VISIT Patient name: Lynn Roberts MRN [id number]  Date of birth: Jun 30, 1986 Chief Complaint:   Routine Prenatal Visit (IT2)  History of Present Illness:   Lynn Roberts is a 31 y.o. G2P0010 female at [redacted]w[redacted]d with an Estimated Date of Delivery: 04/22/18 being seen today for ongoing management of a low-risk pregnancy.  Today she reports no complaints. Contractions: Not present. Vag. Bleeding: None.  Movement: Present. denies leaking of fluid. Review of Systems:   Pertinent items are noted in HPI Denies abnormal vaginal discharge w/ itching/odor/irritation, headaches, visual changes, shortness of breath, chest pain, abdominal pain, severe nausea/vomiting, or problems with urination or bowel movements unless otherwise stated above.  Pertinent History Reviewed:  Medical & Surgical Hx:   Past Medical History:  Diagnosis Date  . Arthritis of low back ([place])   . Complication of anesthesia    is of [name] descent  . GERD (gastroesophageal reflux disease)   . History of PAT (paroxysmal atrial tachycardia)    had cardiac ablation as a teenager  . Migraines   . Miscarriage 04/16/2016  . Obesity   . Papilloma of breast 10/2014   left  . PCO (polycystic ovaries) 07/14/2017  . PCOD (polycystic ovarian disease)    no menses  . Supervision of normal pregnancy 04/09/2016    [facility] at   7+1 week FOB  Charlann Noss Dating By  [place]  Pap  04/09/16 GC/CT Initial:                36+wks: Genetic Screen NT/IT:  CF screen  Anatomic [place]  Flu vaccine  Tdap Recommended ~ 28wks Glucose Screen  2 hr GBS  Feed Preference  Contraception  Circumcision  Childbirth Classes  Pediatrician     Past Surgical History:  Procedure Laterality Date  . BREAST LUMPECTOMY Left 2011   papilloma  . BREAST LUMPECTOMY WITH RADIOACTIVE SEED LOCALIZATION Left 10/18/2014   Procedure:  RADIOACTIVE SEED GUIDED EXCISIONAL BREAST BIOPSY;  Surgeon: Alphonsa Overall, MD;  Location: [facility];  Service: General;  Laterality: Left;  . BREAST SURGERY Right 07/27/2004   retro-areolar dissection with exc. of large duct  . CARDIAC ELECTROPHYSIOLOGY STUDY AND ABLATION  age 31  . CHOLECYSTECTOMY  04/27/2008  . GASTROPLASTY DUODENAL SWITCH  01/2014  . OVARIAN CYST REMOVAL Bilateral   . TOENAIL EXCISION    . UNILATERAL SALPINGECTOMY     Family History  Problem Relation Age of Onset  . Cancer Paternal Grandfather   . Cancer Maternal Grandmother   . Diabetes Maternal Grandfather   . Hypertension Father   . Hyperlipidemia Father   . Hypertension Mother   . Diabetes Mother     Current Outpatient Medications:  .  acetaminophen (TYLENOL) 500 MG tablet, Take 1,000 mg by mouth every 6 (six) hours as needed for mild pain., Disp: , Rfl:  .  prenatal vitamin w/FE, FA (PRENATAL 1 + 1) 27-1 MG TABS tablet, Take 1 tablet by mouth daily at 12 noon., Disp: 30 each, Rfl: 12 .  Doxylamine-Pyridoxine 10-10 MG TBEC, 2 PO qhs; may take 1po in am and 1po in afternoon prn nausea (Patient not taking: Reported on 10/09/2017), Disp: 120 tablet, Rfl: 3 .  glucose 4 GM chewable tablet, Chew 1 tablet by mouth once as needed for low blood sugar., Disp: , Rfl:  Social History: Reviewed -  reports that she has been smoking cigarettes.  She has a 5.00 pack-year smoking history. she has  never used smokeless tobacco.  Physical Assessment:   Vitals:   11/05/17 1107  BP: (!) 102/58  Pulse: 70  Weight: 254 lb (115.2 kg)  Body mass index is 35.43 kg/m.        Physical Examination:   General appearance: Well appearing, and in no distress  Mental status: Alert, oriented to person, place, and time  Skin: Warm & dry  Cardiovascular: Normal heart rate noted  Respiratory: Normal respiratory effort, no distress  Abdomen: Soft, gravid, nontender  Pelvic: Cervical exam deferred         Extremities: Edema: None  Fetal Status:     Movement: Present    Results for orders placed or performed in visit on 11/05/17  (from the past 24 hour(s))  POCT Urinalysis Dipstick   Collection Time: 11/05/17 11:07 AM  Result Value Ref Range   Color, UA     Clarity, UA     Glucose, UA neg    Bilirubin, UA     Ketones, UA neg    Spec Grav, UA  1.010 - 1.025   Blood, UA neg    pH, UA  5.0 - 8.0   Protein, UA neg    Urobilinogen, UA  0.2 or 1.0 E.U./dL   Nitrite, UA neg    Leukocytes, UA Negative Negative    Assessment & Plan:  1) Low-risk pregnancy G2P0010 at [redacted]w[redacted]d with an Estimated Date of Delivery: 04/22/18   2) , watch weight gain   Labs/procedures/US today: 2nd IT today  Plan:  Continue routine obstetrical care `   Follow-up: Return in about 3 weeks (around 11/26/2017) for LROB, [id number].  Orders Placed This Encounter  Procedures  . US OB Comp + 14 Wk  . INTEGRATED 2  . POCT Urinalysis Dipstick   CRESENZO-DISHMAN,Leldon Steege CNM 11/05/2017 11:30 AM

## 2017-11-06 ENCOUNTER — Encounter: Payer: 59 | Admitting: Advanced Practice Midwife

## 2017-11-06 LAB — HEMOGLOBIN A1C
Est. average glucose Bld gHb Est-mCnc: 85 mg/dL
Hgb A1c MFr Bld: 4.6 % — ABNORMAL LOW (ref 4.8–5.6)

## 2017-11-08 LAB — INTEGRATED 2
AFP MoM: 1.51
Alpha-Fetoprotein: 29.3 ng/mL
Crown Rump Length: 53.8 mm
DIA MoM: 1.06
DIA Value: 138 pg/mL
Estriol, Unconjugated: 0.42 ng/mL
Gest. Age on Collection Date: 12 weeks
Gestational Age: 15.9 weeks
Maternal Age at EDD: 34 yr
Nuchal Translucency (NT): 1.5 mm
Nuchal Translucency MoM: 1.05
Number of Fetuses: 1
PAPP-A MoM: 1.34
PAPP-A Value: 546.4 ng/mL
Test Results:: NEGATIVE
Weight: 248 [lb_av]
Weight: 248 [lb_av]
hCG MoM: 0.57
hCG Value: 13.9 IU/mL
uE3 MoM: 0.64

## 2017-11-13 ENCOUNTER — Encounter: Payer: Self-pay | Admitting: Advanced Practice Midwife

## 2017-12-01 ENCOUNTER — Ambulatory Visit (INDEPENDENT_AMBULATORY_CARE_PROVIDER_SITE_OTHER): Payer: 59

## 2017-12-01 ENCOUNTER — Encounter: Payer: Self-pay | Admitting: Obstetrics & Gynecology

## 2017-12-01 ENCOUNTER — Ambulatory Visit (INDEPENDENT_AMBULATORY_CARE_PROVIDER_SITE_OTHER): Payer: 59 | Admitting: Obstetrics & Gynecology

## 2017-12-01 VITALS — BP 104/62 | HR 69 | Wt 260.6 lb

## 2017-12-01 DIAGNOSIS — Z363 Encounter for antenatal screening for malformations: Secondary | ICD-10-CM | POA: Diagnosis not present

## 2017-12-01 DIAGNOSIS — Z3480 Encounter for supervision of other normal pregnancy, unspecified trimester: Secondary | ICD-10-CM

## 2017-12-01 DIAGNOSIS — Z1389 Encounter for screening for other disorder: Secondary | ICD-10-CM

## 2017-12-01 DIAGNOSIS — Z9884 Bariatric surgery status: Secondary | ICD-10-CM

## 2017-12-01 DIAGNOSIS — Z3482 Encounter for supervision of other normal pregnancy, second trimester: Secondary | ICD-10-CM

## 2017-12-01 DIAGNOSIS — Z3A19 19 weeks gestation of pregnancy: Secondary | ICD-10-CM

## 2017-12-01 DIAGNOSIS — Z331 Pregnant state, incidental: Secondary | ICD-10-CM

## 2017-12-01 LAB — POCT URINALYSIS DIPSTICK
Blood, UA: NEGATIVE
Glucose, UA: NEGATIVE
Ketones, UA: NEGATIVE
Leukocytes, UA: NEGATIVE
Nitrite, UA: NEGATIVE
Protein, UA: NEGATIVE

## 2017-12-01 NOTE — Progress Notes (Signed)
G2P0010 [redacted]w[redacted]d Estimated Date of Delivery: 04/22/18  Blood pressure 104/62, pulse 69, weight 260 lb 9.6 oz (118.2 kg), last menstrual period 07/06/2017.   BP weight and urine results all reviewed and noted.  Please refer to the obstetrical flow sheet for the fundal height and fetal heart rate documentation:  Patient reports good fetal movement, denies any bleeding and no rupture of membranes symptoms or regular contractions. Patient is without complaints. All questions were answered.  Orders Placed This Encounter  Procedures  . POCT urinalysis dipstick    Plan:  Continued routine obstetrical care, sonogram normal with mild left renal pelvic dilation, recheck 32 weeks  Return in about 4 weeks (around 12/29/2017) for LROB.

## 2017-12-01 NOTE — Progress Notes (Signed)
[place] 53+9 wks,cephalic,ant pl gr 0,normal ovaries bilat,cx 3.2 cm,svp of fluid 4.8 cm,fhr 138 bpm,mild left pyelectasis 4.4 mm, efw 323 g,anatomy complete

## 2017-12-29 ENCOUNTER — Encounter: Payer: Self-pay | Admitting: Women's Health

## 2017-12-29 ENCOUNTER — Ambulatory Visit (INDEPENDENT_AMBULATORY_CARE_PROVIDER_SITE_OTHER): Payer: 59 | Admitting: Women's Health

## 2017-12-29 VITALS — BP 120/70 | HR 93 | Wt 263.0 lb

## 2017-12-29 DIAGNOSIS — Z1389 Encounter for screening for other disorder: Secondary | ICD-10-CM

## 2017-12-29 DIAGNOSIS — Z3482 Encounter for supervision of other normal pregnancy, second trimester: Secondary | ICD-10-CM

## 2017-12-29 DIAGNOSIS — J069 Acute upper respiratory infection, unspecified: Secondary | ICD-10-CM

## 2017-12-29 DIAGNOSIS — Z331 Pregnant state, incidental: Secondary | ICD-10-CM

## 2017-12-29 DIAGNOSIS — O99512 Diseases of the respiratory system complicating pregnancy, second trimester: Secondary | ICD-10-CM

## 2017-12-29 DIAGNOSIS — Z3480 Encounter for supervision of other normal pregnancy, unspecified trimester: Secondary | ICD-10-CM

## 2017-12-29 DIAGNOSIS — Z3A23 23 weeks gestation of pregnancy: Secondary | ICD-10-CM

## 2017-12-29 DIAGNOSIS — Z9884 Bariatric surgery status: Secondary | ICD-10-CM

## 2017-12-29 LAB — POCT URINALYSIS DIPSTICK
Blood, UA: NEGATIVE
Glucose, UA: NEGATIVE
Ketones, UA: NEGATIVE
Leukocytes, UA: NEGATIVE
Nitrite, UA: NEGATIVE
Protein, UA: NEGATIVE

## 2017-12-29 NOTE — Progress Notes (Signed)
   LOW-RISK PREGNANCY VISIT Patient name: Lynn Roberts MRN [id number]  Date of birth: 1986-09-11 Chief Complaint:   Routine Prenatal Visit  History of Present Illness:   Lynn Roberts is a 32 y.o. G2P0010 female at [redacted]w[redacted]d with an Estimated Date of Delivery: 04/22/18 being seen today for ongoing management of a low-risk pregnancy.  Today she reports cold started Friday- hasn't taken anything. Sneezing, coughing, sore throat. Denies fever/chills, body aches, etc. Contractions: Not present.  .  Movement: Present. denies leaking of fluid. Review of Systems:   Pertinent items are noted in HPI Denies abnormal vaginal discharge w/ itching/odor/irritation, headaches, visual changes, shortness of breath, chest pain, abdominal pain, severe nausea/vomiting, or problems with urination or bowel movements unless otherwise stated above. Pertinent History Reviewed:  Reviewed past medical,surgical, social, obstetrical and family history.  Reviewed problem list, medications and allergies. Physical Assessment:   Vitals:   12/29/17 0922  BP: 120/70  Pulse: 93  Weight: 263 lb (119.3 kg)  Body mass index is 36.68 kg/m.        Physical Examination:   General appearance: Well appearing, and in no distress  Mental status: Alert, oriented to person, place, and time  Skin: Warm & dry  Cardiovascular: Normal heart rate noted  Respiratory: Normal respiratory effort, no distress  Abdomen: Soft, gravid, nontender  Pelvic: Cervical exam deferred         Extremities: Edema: None  Fetal Status: Fetal Heart Rate (bpm): 144 Fundal Height: 23 cm Movement: Present    Results for orders placed or performed in visit on 12/29/17 (from the past 24 hour(s))  POCT urinalysis dipstick   Collection Time: 12/29/17  9:28 AM  Result Value Ref Range   Color, UA     Clarity, UA     Glucose, UA neg    Bilirubin, UA     Ketones, UA neg    Spec Grav, UA  1.010 - 1.025   Blood, UA neg    pH, UA  5.0 - 8.0   Protein, UA  neg    Urobilinogen, UA  0.2 or 1.0 E.U./dL   Nitrite, UA neg    Leukocytes, UA Negative Negative   Appearance     Odor      Assessment & Plan:  1) Low-risk pregnancy G2P0010 at [redacted]w[redacted]d with an Estimated Date of Delivery: 04/22/18   2) URI, gave printed relief measures, call if not improving after 7-10d from onset of sx    Meds: No orders of the defined types were placed in this encounter.  Labs/procedures today: none  Plan:  Continue routine obstetrical care   Reviewed: Preterm labor symptoms and general obstetric precautions including but not limited to vaginal bleeding, contractions, leaking of fluid and fetal movement were reviewed in detail with the patient.  All questions were answered  Follow-up: Return in about 4 weeks (around 01/26/2018) for LROB, PN2.  Orders Placed This Encounter  Procedures  . POCT urinalysis dipstick   Tawnya Crook CNM, [facility] 12/29/2017 9:50 AM

## 2017-12-29 NOTE — Patient Instructions (Addendum)
Lynn Roberts, I greatly value your feedback.  If you receive a survey following your visit with [place] today, we appreciate you taking the time to fill it out.  Thanks, Knute Neu, CNM, WHNP-BC   You will have your sugar test next visit.  Please do not eat or drink anything after midnight the night before you come, not even water.  You will be here for at least two hours.     Call the office [phone]) or go to [facility] if:  You begin to have strong, frequent contractions  Your water breaks.  Sometimes it is a big gush of fluid, sometimes it is just a trickle that keeps getting your panties wet or running down your legs  You have vaginal bleeding.  It is normal to have a small amount of spotting if your cervix was checked.   You don't feel your baby moving like normal.  If you don't, get you something to eat and drink and lay down and focus on feeling your baby move.   If your baby is still not moving like normal, you should call the office or go to [place] have a viral infection that will resolve on its own over time.  Symptoms typically last 3-7 days but can stretch out to 2-3 weeks.  Unfortunately, antibiotics are not helpful for viral infections.  Humidifier and saline nasal spray for nasal congestion  Regular robitussin, cough drops for cough  Warm salt water gargles for sore throat  Mucinex with lots of water to help you cough up the mucous in your chest if needed  Drink plenty of fluids and stay hydrated!  Wash your hands frequently.  Call if you are not improving by 7-10 days.     Second Trimester of Pregnancy The second trimester is from week 13 through week 28, months 4 through 6. The second trimester is often a time when you feel your best. Your body has also adjusted to being pregnant, and you begin to feel better physically. Usually, morning sickness has lessened or quit completely, you may have more energy, and you may have an increase in  appetite. The second trimester is also a time when the fetus is growing rapidly. At the end of the sixth month, the fetus is about 9 inches long and weighs about 1 pounds. You will likely begin to feel the baby move (quickening) between 18 and 20 weeks of the pregnancy. BODY CHANGES Your body goes through many changes during pregnancy. The changes vary from woman to woman.   Your weight will continue to increase. You will notice your lower abdomen bulging out.  You may begin to get stretch marks on your hips, abdomen, and breasts.  You may develop headaches that can be relieved by medicines approved by your health care provider.  You may urinate more often because the fetus is pressing on your bladder.  You may develop or continue to have heartburn as a result of your pregnancy.  You may develop constipation because certain hormones are causing the muscles that push waste through your intestines to slow down.  You may develop hemorrhoids or swollen, bulging veins (varicose veins).  You may have back pain because of the weight gain and pregnancy hormones relaxing your joints between the bones in your pelvis and as a result of a shift in weight and the muscles that support your balance.  Your breasts will continue to grow and be tender.  Your gums may bleed  and may be sensitive to brushing and flossing.  Dark spots or blotches (chloasma, mask of pregnancy) may develop on your face. This will likely fade after the baby is born.  A dark line from your belly button to the pubic area (linea nigra) may appear. This will likely fade after the baby is born.  You may have changes in your hair. These can include thickening of your hair, rapid growth, and changes in texture. Some women also have hair loss during or after pregnancy, or hair that feels dry or thin. Your hair will most likely return to normal after your baby is born. WHAT TO EXPECT AT YOUR PRENATAL VISITS During a routine prenatal  visit:  You will be weighed to make sure you and the fetus are growing normally.  Your blood pressure will be taken.  Your abdomen will be measured to track your baby's growth.  The fetal heartbeat will be listened to.  Any test results from the previous visit will be discussed. Your health care provider may ask you:  How you are feeling.  If you are feeling the baby move.  If you have had any abnormal symptoms, such as leaking fluid, bleeding, severe headaches, or abdominal cramping.  If you have any questions. Other tests that may be performed during your second trimester include:  Blood tests that check for:  Low iron levels (anemia).  Gestational diabetes (between 24 and 28 weeks).  Rh antibodies.  Urine tests to check for infections, diabetes, or protein in the urine.  An ultrasound to confirm the proper growth and development of the baby.  An amniocentesis to check for possible genetic problems.  Fetal screens for spina bifida and Down syndrome. HOME CARE INSTRUCTIONS   Avoid all smoking, herbs, alcohol, and unprescribed drugs. These chemicals affect the formation and growth of the baby.  Follow your health care provider's instructions regarding medicine use. There are medicines that are either safe or unsafe to take during pregnancy.  Exercise only as directed by your health care provider. Experiencing uterine cramps is a good sign to stop exercising.  Continue to eat regular, healthy meals.  Wear a good support bra for breast tenderness.  Do not use hot tubs, steam rooms, or saunas.  Wear your seat belt at all times when driving.  Avoid raw meat, uncooked cheese, cat litter boxes, and soil used by cats. These carry germs that can cause birth defects in the baby.  Take your prenatal vitamins.  Try taking a stool softener (if your health care provider approves) if you develop constipation. Eat more high-fiber foods, such as fresh vegetables or fruit and  whole grains. Drink plenty of fluids to keep your urine clear or pale yellow.  Take warm sitz baths to soothe any pain or discomfort caused by hemorrhoids. Use hemorrhoid cream if your health care provider approves.  If you develop varicose veins, wear support hose. Elevate your feet for 15 minutes, 3-4 times a day. Limit salt in your diet.  Avoid heavy lifting, wear low heel shoes, and practice good posture.  Rest with your legs elevated if you have leg cramps or low back pain.  Visit your dentist if you have not gone yet during your pregnancy. Use a soft toothbrush to brush your teeth and be gentle when you floss.  A sexual relationship may be continued unless your health care provider directs you otherwise.  Continue to go to all your prenatal visits as directed by your health care provider. SEEK  MEDICAL CARE IF:   You have dizziness.  You have mild pelvic cramps, pelvic pressure, or nagging pain in the abdominal area.  You have persistent nausea, vomiting, or diarrhea.  You have a bad smelling vaginal discharge.  You have pain with urination. SEEK IMMEDIATE MEDICAL CARE IF:   You have a fever.  You are leaking fluid from your vagina.  You have spotting or bleeding from your vagina.  You have severe abdominal cramping or pain.  You have rapid weight gain or loss.  You have shortness of breath with chest pain.  You notice sudden or extreme swelling of your face, hands, ankles, feet, or legs.  You have not felt your baby move in over an hour.  You have severe headaches that do not go away with medicine.  You have vision changes. Document Released: 11/12/2001 Document Revised: 11/23/2013 Document Reviewed: 01/19/2013 [facility] Patient Information 2015 [place], [place]. This information is not intended to replace advice given to you by your health care provider. Make sure you discuss any questions you have with your health care provider.

## 2018-01-01 ENCOUNTER — Other Ambulatory Visit: Payer: Self-pay | Admitting: Women's Health

## 2018-01-01 ENCOUNTER — Encounter: Payer: Self-pay | Admitting: Women's Health

## 2018-01-01 MED ORDER — AMOXICILLIN-POT CLAVULANATE 875-125 MG PO TABS: 1.0000 | ORAL_TABLET | Freq: Two times a day (BID) | ORAL | 0 refills | Status: DC

## 2018-01-01 NOTE — Progress Notes (Signed)
Pt sent mychart message, cold sx not improving, has been right at a week since sx onset. Rx augmenting bid x 7d. Call if not improving.  Roma Schanz, CNM, [facility] 01/01/2018 3:45 PM

## 2018-01-11 ENCOUNTER — Encounter: Payer: Self-pay | Admitting: Women's Health

## 2018-01-21 ENCOUNTER — Encounter (HOSPITAL_COMMUNITY): Payer: Self-pay

## 2018-01-21 ENCOUNTER — Inpatient Hospital Stay (HOSPITAL_COMMUNITY)
Admission: AD | Admit: 2018-01-21 | Discharge: 2018-01-21 | Disposition: A | Discharge status: Home / Self Care | Payer: 59 | Source: Ambulatory Visit | Attending: Obstetrics & Gynecology | Admitting: Obstetrics & Gynecology

## 2018-01-21 DIAGNOSIS — R109 Unspecified abdominal pain: Secondary | ICD-10-CM

## 2018-01-21 DIAGNOSIS — Z3480 Encounter for supervision of other normal pregnancy, unspecified trimester: Secondary | ICD-10-CM

## 2018-01-21 DIAGNOSIS — K5900 Constipation, unspecified: Secondary | ICD-10-CM | POA: Diagnosis not present

## 2018-01-21 DIAGNOSIS — Z3A29 29 weeks gestation of pregnancy: Secondary | ICD-10-CM | POA: Diagnosis not present

## 2018-01-21 DIAGNOSIS — R1013 Epigastric pain: Secondary | ICD-10-CM | POA: Diagnosis present

## 2018-01-21 DIAGNOSIS — O26892 Other specified pregnancy related conditions, second trimester: Secondary | ICD-10-CM | POA: Diagnosis present

## 2018-01-21 DIAGNOSIS — Z888 Allergy status to other drugs, medicaments and biological substances status: Secondary | ICD-10-CM | POA: Insufficient documentation

## 2018-01-21 DIAGNOSIS — O26893 Other specified pregnancy related conditions, third trimester: Secondary | ICD-10-CM | POA: Diagnosis not present

## 2018-01-21 DIAGNOSIS — O99332 Smoking (tobacco) complicating pregnancy, second trimester: Secondary | ICD-10-CM | POA: Diagnosis not present

## 2018-01-21 DIAGNOSIS — Z3A27 27 weeks gestation of pregnancy: Secondary | ICD-10-CM | POA: Diagnosis not present

## 2018-01-21 DIAGNOSIS — Z882 Allergy status to sulfonamides status: Secondary | ICD-10-CM | POA: Insufficient documentation

## 2018-01-21 DIAGNOSIS — K219 Gastro-esophageal reflux disease without esophagitis: Secondary | ICD-10-CM

## 2018-01-21 DIAGNOSIS — Z9884 Bariatric surgery status: Secondary | ICD-10-CM

## 2018-01-21 DIAGNOSIS — Z886 Allergy status to analgesic agent status: Secondary | ICD-10-CM | POA: Insufficient documentation

## 2018-01-21 LAB — URINALYSIS, ROUTINE W REFLEX MICROSCOPIC
Bilirubin Urine: NEGATIVE
Glucose, UA: NEGATIVE mg/dL
Hgb urine dipstick: NEGATIVE
Ketones, ur: NEGATIVE mg/dL
Leukocytes, UA: NEGATIVE
Nitrite: NEGATIVE
Protein, ur: NEGATIVE mg/dL
Specific Gravity, Urine: 1.019 (ref 1.005–1.030)
pH: 6 (ref 5.0–8.0)

## 2018-01-21 MED ORDER — GI COCKTAIL ~~LOC~~
30.0000 mL | Freq: Once | ORAL | Status: AC
  Administered 2018-01-21: 30 mL via ORAL
  Filled 2018-01-21: qty 30

## 2018-01-21 MED ORDER — RANITIDINE HCL 75 MG PO TABS: 75.0000 mg | ORAL_TABLET | Freq: Two times a day (BID) | ORAL | 0 refills | Status: DC

## 2018-01-21 MED ORDER — POLYETHYLENE GLYCOL 3350 17 G PO PACK: 17.0000 g | PACK | Freq: Every day | ORAL | 0 refills | Status: DC

## 2018-01-21 NOTE — MAU Provider Note (Signed)
Chief Complaint:  Abdominal Pain and Nausea   First Provider Initiated Contact with Patient 01/21/18 0317     HPI: Lynn Roberts is a 32 y.o. G2P0010 at 26w0dwho presents to maternity admissions reporting Epigastric pain today.  Woke her up at midnight.  Had been on omeprazole for a year after gastric bypass surgery but is not on anything now. . She reports good fetal movement, denies LOF, vaginal bleeding, vaginal itching/burning, urinary symptoms, h/a, dizziness, n/v, diarrhea, constipation or fever/chills. .  Abdominal Pain  This is a new problem. The current episode started today. The problem has been unchanged. The pain is located in the epigastric region. The quality of the pain is burning. The abdominal pain does not radiate. Associated symptoms include constipation. Pertinent negatives include no diarrhea, dysuria, fever, myalgias, nausea or vomiting. Nothing aggravates the pain. The pain is relieved by nothing. She has tried nothing for the symptoms.   RN Note: Pt here with c/o upper stomach pain and nausea. Denies any leaking of fluid or bleeding. Reports positive fetal movement.    Past Medical History: Past Medical History:  Diagnosis Date  . Arthritis of low back ([place])   . Complication of anesthesia    is of [name] descent  . GERD (gastroesophageal reflux disease)   . History of PAT (paroxysmal atrial tachycardia)    had cardiac ablation as a teenager  . Migraines   . Miscarriage 04/16/2016  . Obesity   . Papilloma of breast 10/2014   left  . PCO (polycystic ovaries) 07/14/2017  . PCOD (polycystic ovarian disease)    no menses  . Supervision of normal pregnancy 04/09/2016    [facility] at   7+1 week FOB  Charlann Noss Dating By  [place]  Pap  04/09/16 GC/CT Initial:                36+wks: Genetic Screen NT/IT:  CF screen  Anatomic [place]  Flu vaccine  Tdap Recommended ~ 28wks Glucose Screen  2 hr GBS  Feed Preference  Contraception  Circumcision  Childbirth  Classes  Pediatrician      Past obstetric history: OB History  Gravida Para Term Preterm AB Living  2       1    SAB TAB Ectopic Multiple Live Births  1     0      # Outcome Date GA Lbr Len/2nd Weight Sex Delivery Anes PTL Lv  2 Current           1 SAB 04/2016              Past Surgical History: Past Surgical History:  Procedure Laterality Date  . BREAST LUMPECTOMY Left 2011   papilloma  . BREAST LUMPECTOMY WITH RADIOACTIVE SEED LOCALIZATION Left 10/18/2014   Procedure:  RADIOACTIVE SEED GUIDED EXCISIONAL BREAST BIOPSY;  Surgeon: Alphonsa Overall, MD;  Location: [place];  Service: General;  Laterality: Left;  . BREAST SURGERY Right 07/27/2004   retro-areolar dissection with exc. of large duct  . CARDIAC ELECTROPHYSIOLOGY STUDY AND ABLATION  age 30  . CHOLECYSTECTOMY  04/27/2008  . GASTROPLASTY DUODENAL SWITCH  01/2014  . OVARIAN CYST REMOVAL Bilateral   . TOENAIL EXCISION    . UNILATERAL SALPINGECTOMY      Family History: Family History  Problem Relation Age of Onset  . Cancer Paternal Grandfather   . Cancer Maternal Grandmother   . Diabetes Maternal Grandfather   . Hypertension Father   .  Hyperlipidemia Father   . Hypertension Mother   . Diabetes Mother     Social History: Social History   Tobacco Use  . Smoking status: Current Every Day Smoker    Packs/day: 0.50    Years: 10.00    Pack years: 5.00    Types: Cigarettes  . Smokeless tobacco: Never Used  Substance Use Topics  . Alcohol use: No    Comment: occ.  . Drug use: No    Allergies:  Allergies  Allergen Reactions  . Nsaids Other (See Comments)    HX. OF WEIGHT-LOSS SURGERY  . Other Rash    LOTIONS   . Sulfa Antibiotics Rash  . Tramadol Rash    Meds:  Medications Prior to Admission  Medication Sig Dispense Refill Last Dose  . amoxicillin-clavulanate (AUGMENTIN) 875-125 MG tablet Take 1 tablet by mouth 2 (two) times daily. X 7 days 14 tablet 0   . prenatal vitamin w/FE, FA  (PRENATAL 1 + 1) 27-1 MG TABS tablet Take 1 tablet by mouth daily at 12 noon. 26 each 12 Taking    I have reviewed patient's Past Medical Hx, Surgical Hx, Family Hx, Social Hx, medications and allergies.   ROS:  Review of Systems  Constitutional: Negative for fever.  Gastrointestinal: Positive for abdominal pain and constipation. Negative for diarrhea, nausea and vomiting.  Genitourinary: Negative for dysuria.  Musculoskeletal: Negative for myalgias.   Other systems negative  Physical Exam   Patient Vitals for the past 24 hrs:  BP Temp Temp src Pulse Resp SpO2 Height Weight  01/21/18 0305 112/62 98.4 F (36.9 C) Oral 75 18 100 % 5\' 11"  (1.803 m) 268 lb (121.6 kg)   Constitutional: Well-developed, well-nourished female in no acute distress.  Cardiovascular: normal rate and rhythm Respiratory: normal effort, clear to auscultation bilaterally GI: Abd soft, non-tender, gravid appropriate for gestational age.   No rebound or guarding. MS: Extremities nontender, no edema, normal ROM Neurologic: Alert and oriented x 4.  GU: Neg CVAT.  PELVIC EXAM:  deferred  FHT:  Baseline 140 , moderate variability, accelerations present, no decelerations Contractions:   Rare   Labs: B/Positive/-- (10/11 1112) Results for orders placed or performed during the hospital encounter of 01/21/18 (from the past 24 hour(s))  Urinalysis, Routine w reflex microscopic     Status: None   Collection Time: 01/21/18  3:00 AM  Result Value Ref Range   Color, Urine YELLOW YELLOW   APPearance CLEAR CLEAR   Specific Gravity, Urine 1.019 1.005 - 1.030   pH 6.0 5.0 - 8.0   Glucose, UA NEGATIVE NEGATIVE mg/dL   Hgb urine dipstick NEGATIVE NEGATIVE   Bilirubin Urine NEGATIVE NEGATIVE   Ketones, ur NEGATIVE NEGATIVE mg/dL   Protein, ur NEGATIVE NEGATIVE mg/dL   Nitrite NEGATIVE NEGATIVE   Leukocytes, UA NEGATIVE NEGATIVE    Imaging:  No results found.  MAU Course/MDM: I have ordered labs and reviewed  results. Urine clear NST reviewed, reactive.  Treatments in MAU included GI cocktail which produced complete relief. Will start her on Zantac and she requested constipation relief, will try Miralax.    Assessment: 1. Supervision of other normal pregnancy, antepartum   2. H/O bariatric surgery   3.  SIUP at [redacted]w[redacted]d 4.   Epigastric pain, likely reflux 5.   Constipation  Plan: Discharge home Rx Miralax for constipation Rx Zantac for reflux Preterm Labor precautions and fetal kick counts Follow up in Office for prenatal visits and recheck of status  Encouraged to  return here or to other Urgent Care/ED if she develops worsening of symptoms, increase in pain, fever, or other concerning symptoms.   Pt stable at time of discharge.  Hansel Feinstein CNM, MSN Certified Nurse-Midwife 01/21/2018 3:17 AM

## 2018-01-21 NOTE — MAU Note (Signed)
Pt here with c/o upper stomach pain and nausea. Denies any leaking of fluid or bleeding. Reports positive fetal movement.

## 2018-01-21 NOTE — Discharge Instructions (Signed)

## 2018-01-26 ENCOUNTER — Other Ambulatory Visit: Payer: Self-pay

## 2018-01-26 ENCOUNTER — Encounter: Payer: Self-pay | Admitting: Obstetrics & Gynecology

## 2018-01-26 ENCOUNTER — Ambulatory Visit (INDEPENDENT_AMBULATORY_CARE_PROVIDER_SITE_OTHER): Payer: 59 | Admitting: Obstetrics & Gynecology

## 2018-01-26 ENCOUNTER — Other Ambulatory Visit: Payer: 59

## 2018-01-26 VITALS — BP 100/62 | HR 75 | Wt 269.0 lb

## 2018-01-26 DIAGNOSIS — Z3482 Encounter for supervision of other normal pregnancy, second trimester: Secondary | ICD-10-CM

## 2018-01-26 DIAGNOSIS — Z3480 Encounter for supervision of other normal pregnancy, unspecified trimester: Secondary | ICD-10-CM

## 2018-01-26 DIAGNOSIS — Z131 Encounter for screening for diabetes mellitus: Secondary | ICD-10-CM

## 2018-01-26 DIAGNOSIS — Z331 Pregnant state, incidental: Secondary | ICD-10-CM

## 2018-01-26 DIAGNOSIS — Z3A27 27 weeks gestation of pregnancy: Secondary | ICD-10-CM

## 2018-01-26 DIAGNOSIS — Z1389 Encounter for screening for other disorder: Secondary | ICD-10-CM

## 2018-01-26 LAB — POCT URINALYSIS DIPSTICK
Blood, UA: NEGATIVE
Glucose, UA: NEGATIVE
Ketones, UA: NEGATIVE
Leukocytes, UA: NEGATIVE
Nitrite, UA: NEGATIVE
Protein, UA: NEGATIVE

## 2018-01-26 NOTE — Progress Notes (Signed)
G2P0010 [redacted]w[redacted]d Estimated Date of Delivery: 04/22/18  Blood pressure 100/62, pulse 75, weight 269 lb (122 kg), last menstrual period 07/06/2017.   BP weight and urine results all reviewed and noted.  Please refer to the obstetrical flow sheet for the fundal height and fetal heart rate documentation:  Patient reports good fetal movement, denies any bleeding and no rupture of membranes symptoms or regular contractions. Patient is without complaints. All questions were answered.  Orders Placed This Encounter  Procedures  . POCT urinalysis dipstick    Plan:  Continued routine obstetrical care, PN2 U+14 Repeat [place] >32 weeks to look at renal pelvis Return in about 3 weeks (around 02/16/2018) for LROB.

## 2018-01-27 LAB — RPR: RPR Ser Ql: NONREACTIVE

## 2018-01-27 LAB — CBC
Hematocrit: 34.5 % (ref 34.0–46.6)
Hemoglobin: 11.2 g/dL (ref 11.1–15.9)
MCH: 28.5 pg (ref 26.6–33.0)
MCHC: 32.5 g/dL (ref 31.5–35.7)
MCV: 88 fL (ref 79–97)
Platelets: 214 10*3/uL (ref 150–379)
RBC: 3.93 x10E6/uL (ref 3.77–5.28)
RDW: 13.7 % (ref 12.3–15.4)
WBC: 11 10*3/uL — ABNORMAL HIGH (ref 3.4–10.8)

## 2018-01-27 LAB — ANTIBODY SCREEN: Antibody Screen: NEGATIVE

## 2018-01-27 LAB — GLUCOSE TOLERANCE, 2 HOURS W/ 1HR
Glucose, 1 hour: 71 mg/dL (ref 65–179)
Glucose, 2 hour: 52 mg/dL — ABNORMAL LOW (ref 65–152)
Glucose, Fasting: 70 mg/dL (ref 65–91)

## 2018-01-27 LAB — HIV ANTIBODY (ROUTINE TESTING W REFLEX): HIV Screen 4th Generation wRfx: NONREACTIVE

## 2018-01-28 ENCOUNTER — Telehealth: Payer: Self-pay | Admitting: Obstetrics & Gynecology

## 2018-01-28 ENCOUNTER — Encounter: Payer: Self-pay | Admitting: *Deleted

## 2018-02-07 IMAGING — DX DG CHEST 2V
2 series · 2 of 2 positions shown · non-contrast
Comparison: 09/28/2012

CLINICAL DATA: PT c/o sore throat, cough, nasal congestion with no
fever x1 day. PT states she had tylenol x5 hours ago today

EXAM:
CHEST  2 VIEW

[chest pa]
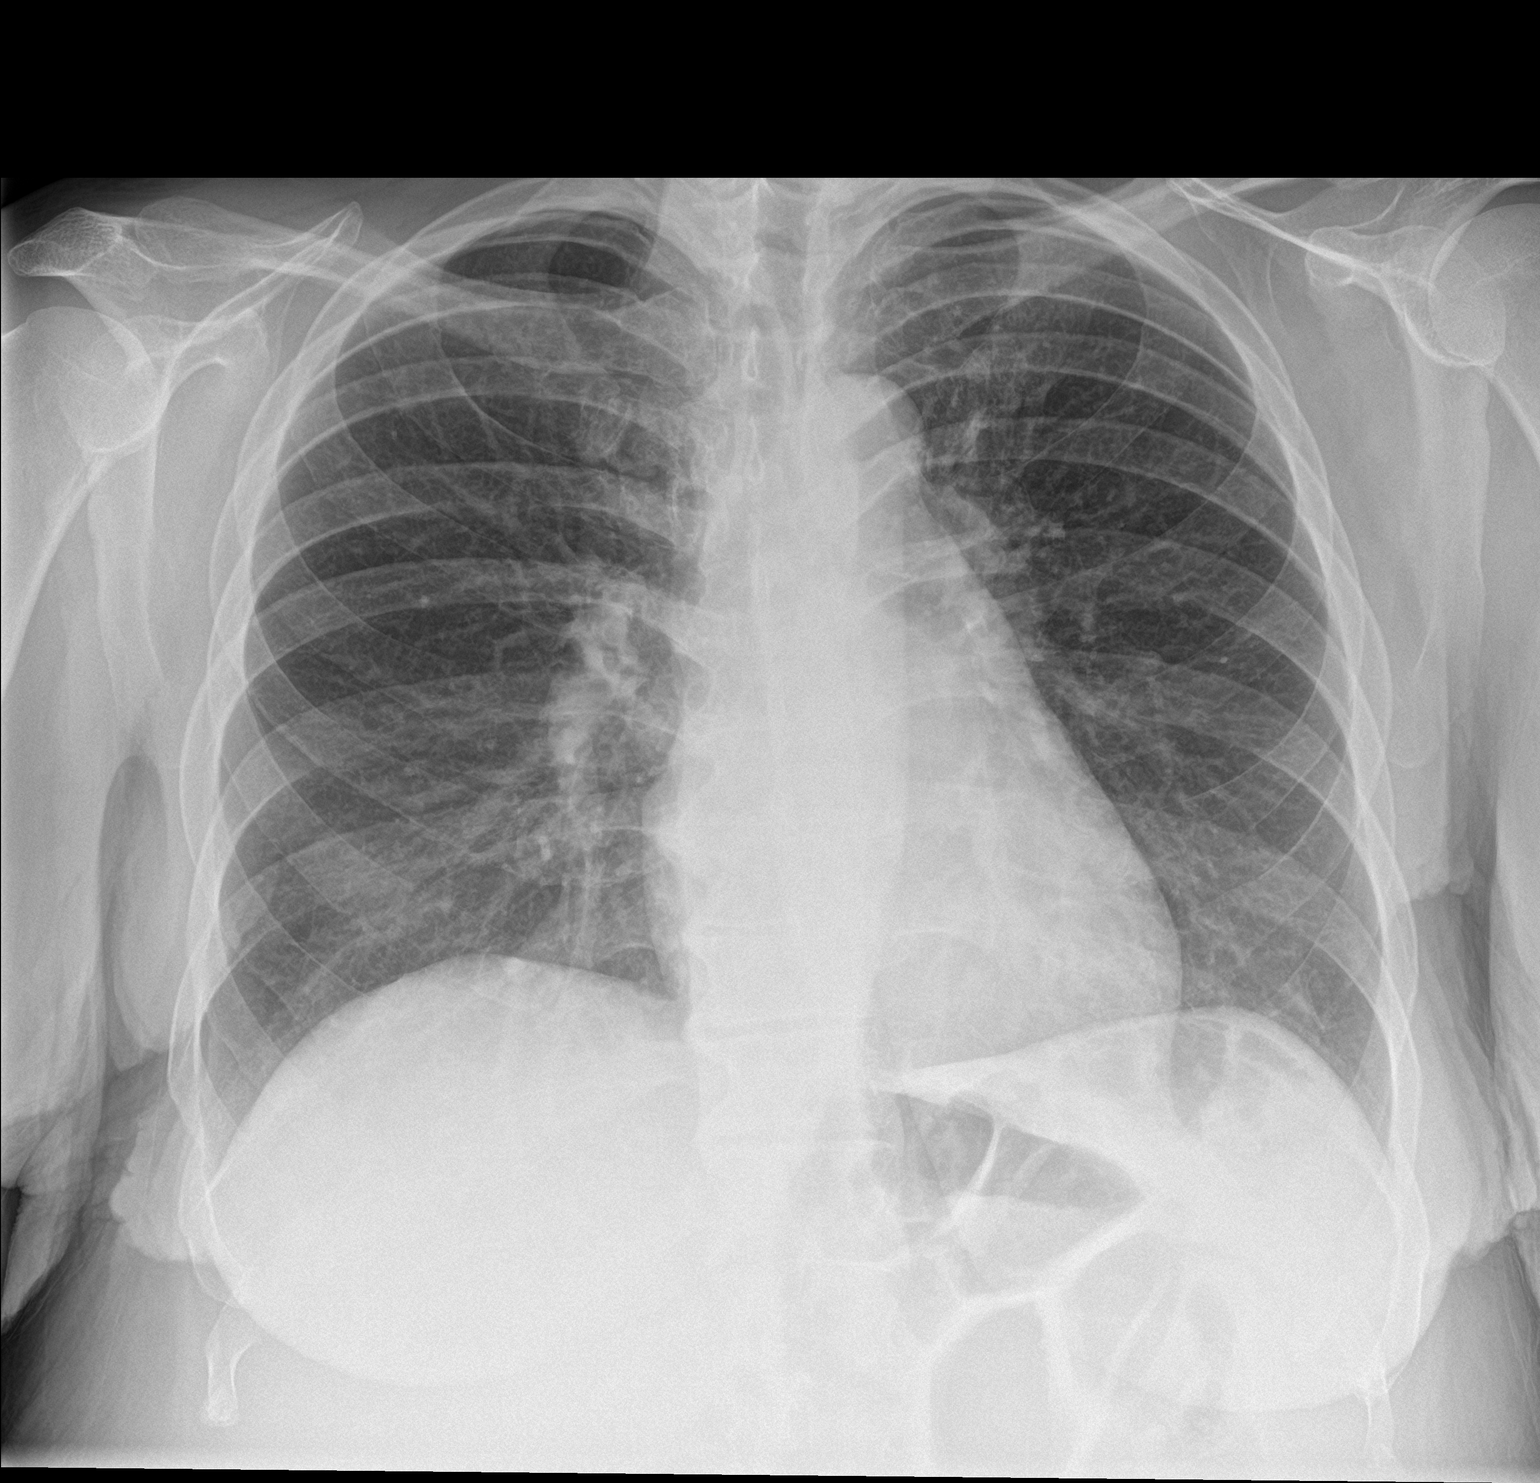

[chest lat]
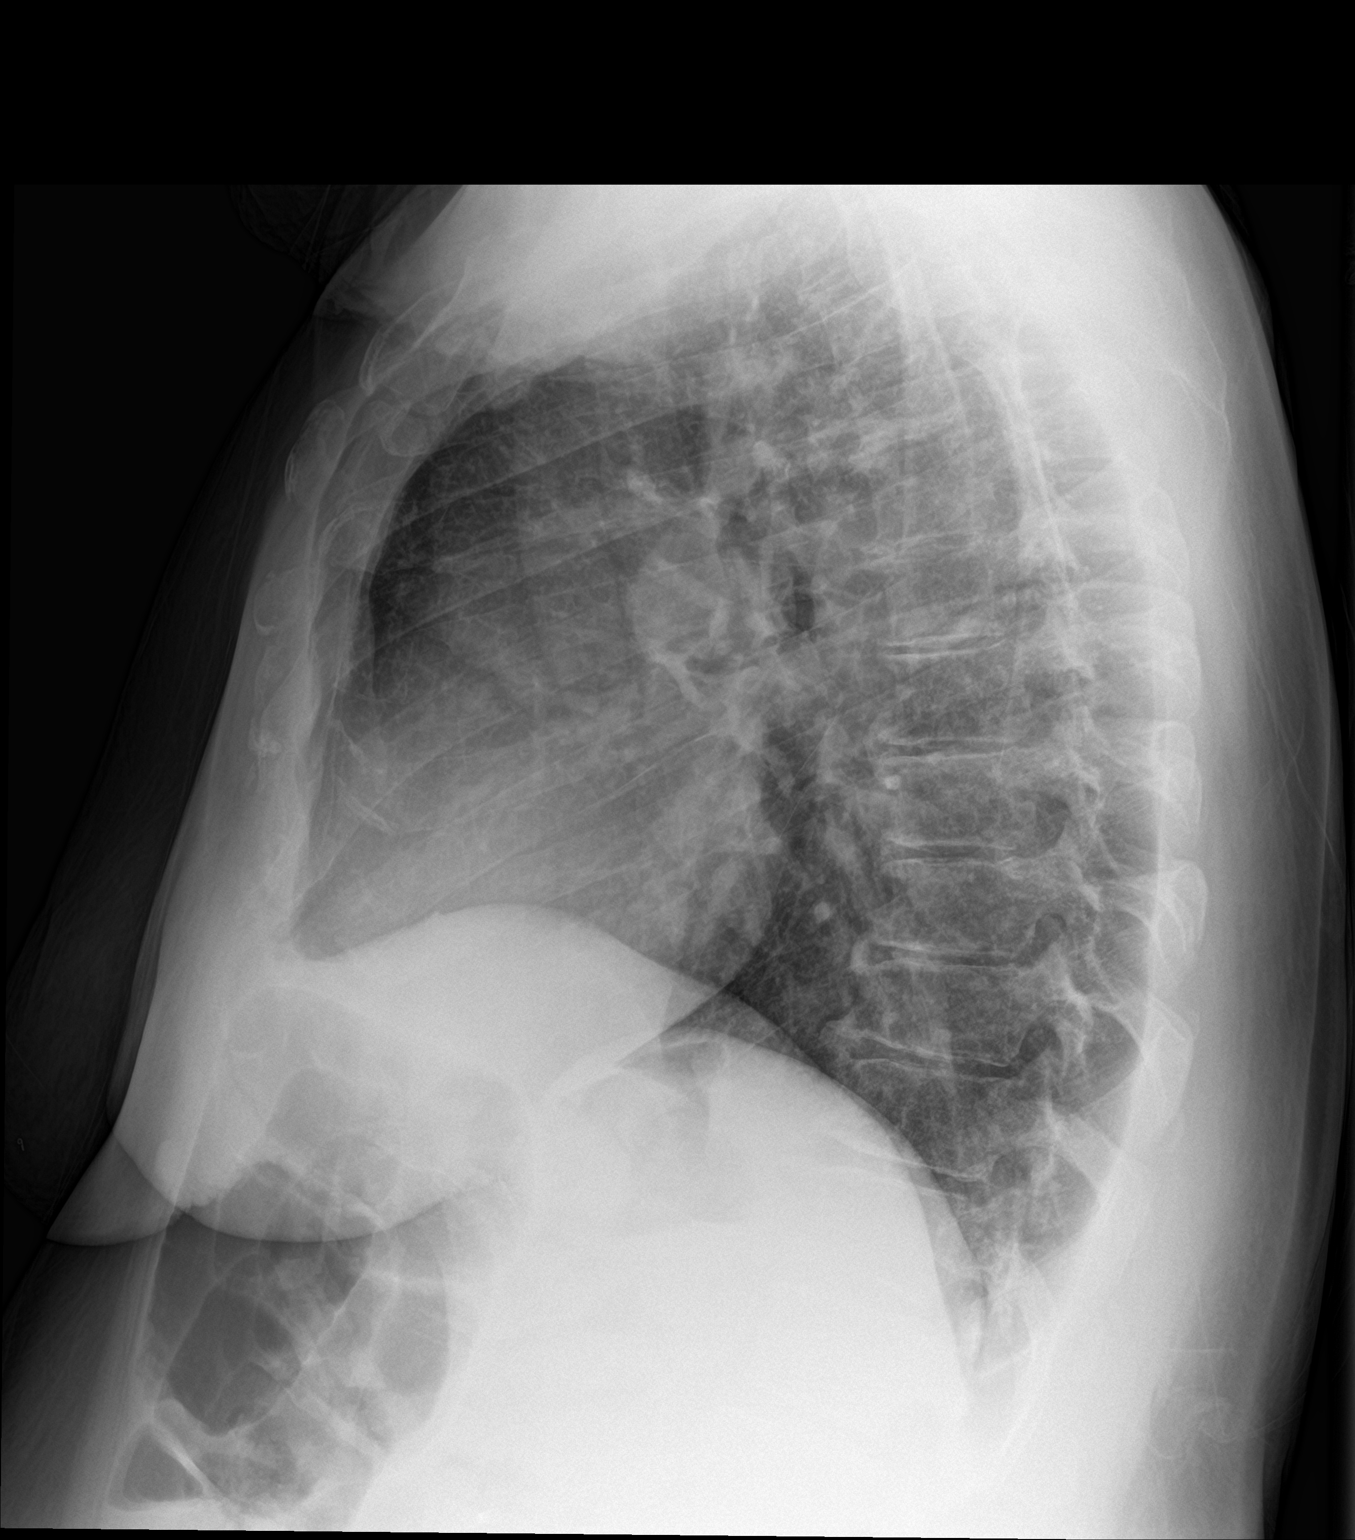

[2 of 2 positions shown; findings below may reference images not displayed]

FINDINGS: Moderate and age advanced thoracic spondylosis. Midline trachea.
Normal heart size and mediastinal contours. No pleural effusion or
pneumothorax. Clear lungs.
IMPRESSION: No active cardiopulmonary disease.

## 2018-02-08 ENCOUNTER — Encounter: Payer: Self-pay | Admitting: Women's Health

## 2018-02-16 ENCOUNTER — Encounter: Payer: Self-pay | Admitting: Obstetrics and Gynecology

## 2018-02-16 ENCOUNTER — Other Ambulatory Visit: Payer: Self-pay

## 2018-02-16 ENCOUNTER — Ambulatory Visit (INDEPENDENT_AMBULATORY_CARE_PROVIDER_SITE_OTHER): Payer: 59 | Admitting: Obstetrics and Gynecology

## 2018-02-16 VITALS — BP 102/58 | HR 82 | Wt 281.0 lb

## 2018-02-16 DIAGNOSIS — Z1389 Encounter for screening for other disorder: Secondary | ICD-10-CM

## 2018-02-16 DIAGNOSIS — Z331 Pregnant state, incidental: Secondary | ICD-10-CM

## 2018-02-16 DIAGNOSIS — Z3A30 30 weeks gestation of pregnancy: Secondary | ICD-10-CM

## 2018-02-16 DIAGNOSIS — O9989 Other specified diseases and conditions complicating pregnancy, childbirth and the puerperium: Secondary | ICD-10-CM

## 2018-02-16 DIAGNOSIS — N130 Hydronephrosis with ureteropelvic junction obstruction: Secondary | ICD-10-CM

## 2018-02-16 DIAGNOSIS — Z3483 Encounter for supervision of other normal pregnancy, third trimester: Secondary | ICD-10-CM

## 2018-02-16 LAB — POCT URINALYSIS DIPSTICK
Blood, UA: NEGATIVE
Glucose, UA: NEGATIVE
Ketones, UA: NEGATIVE
Leukocytes, UA: NEGATIVE
Nitrite, UA: NEGATIVE
Protein, UA: NEGATIVE

## 2018-02-16 NOTE — Progress Notes (Signed)
   LOW-RISK PREGNANCY VISIT Patient name: Lynn Roberts MRN [id number]  Date of birth: 27-Aug-1986 Chief Complaint:    patient has multiple questions as she went to [facility] for $100 worth of pictures this weekend and they said the baby had increased fluid around it.  Patient has a history of minimal pyelectasis, 4.4 mm on second trimester ultrasound so will order a follow-up ultrasound regarding the pyelectasis and measure fluid History of Present Illness:   Lynn Roberts is a 32 y.o. G2P0010 female at [redacted]w[redacted]d with an Estimated Date of Delivery: 04/22/18 being seen today for ongoing management of a low-risk pregnancy.  Today she reports no complaints. Contractions: Not present. Vag. Bleeding: None.  Movement: Present. denies leaking of fluid. Review of Systems:   Pertinent items are noted in HPI Denies abnormal vaginal discharge w/ itching/odor/irritation, headaches, visual changes, shortness of breath, chest pain, abdominal pain, severe nausea/vomiting, or problems with urination or bowel movements unless otherwise stated above. Pertinent History Reviewed:  Reviewed past medical,surgical, social, obstetrical and family history.  Reviewed problem list, medications and allergies. Physical Assessment:   Vitals:   02/16/18 0924  BP: (!) 102/58  Pulse: 82  Weight: 281 lb (127.5 kg)  Body mass index is 39.19 kg/m.        Physical Examination:   General appearance: Well appearing, and in no distress  Mental status: Alert, oriented to person, place, and time  Skin: Warm & dry  Fundal height 32 cm Fetal Status: Fetal Heart Rate (bpm): 154 Fundal Height: 32 cm Movement: Present    Results for orders placed or performed in visit on 02/16/18 (from the past 24 hour(s))  POCT urinalysis dipstick   Collection Time: 02/16/18  9:24 AM  Result Value Ref Range   Color, UA     Clarity, UA     Glucose, UA neg    Bilirubin, UA     Ketones, UA neg    Spec Grav, UA  1.010 - 1.025   Blood, UA neg     pH, UA  5.0 - 8.0   Protein, UA neg    Urobilinogen, UA  0.2 or 1.0 E.U./dL   Nitrite, UA neg    Leukocytes, UA Negative Negative   Appearance     Odor      Assessment & Plan:  1) Low-risk pregnancy G2P0010 at [redacted]w[redacted]d with an Estimated Date of Delivery: 04/22/18   2) Mild left renal pyelectasis, Noted on 20 week ultrasound. Plan for repeat sonogram scheduled for her next visit at 32 weeks to look at renal pelvis.   Meds: No orders of the defined types were placed in this encounter.  Labs/procedures today: None.  Plan:  Continue routine obstetrical care.  Reviewed: Preterm labor symptoms and general obstetric precautions including but not limited to vaginal bleeding, contractions, leaking of fluid and fetal movement were reviewed in detail with the patient.  All questions were answered  Follow-up: No Follow-up on file.  Orders Placed This Encounter  Procedures  . POCT urinalysis dipstick  Seen with Everrett Coombe MD Jonnie Kind CNM, [facility] 02/16/2018 10:10 AM

## 2018-02-24 ENCOUNTER — Encounter: Payer: Self-pay | Admitting: Women's Health

## 2018-02-24 ENCOUNTER — Telehealth: Payer: Self-pay | Admitting: Obstetrics & Gynecology

## 2018-02-24 NOTE — Telephone Encounter (Signed)
Pt sent email and we're communicating that way. Encounter closed. [place]

## 2018-02-27 ENCOUNTER — Other Ambulatory Visit: Payer: Self-pay | Admitting: Obstetrics and Gynecology

## 2018-02-27 DIAGNOSIS — O358XX0 Maternal care for other (suspected) fetal abnormality and damage, not applicable or unspecified: Secondary | ICD-10-CM

## 2018-02-27 DIAGNOSIS — O35EXX0 Maternal care for other (suspected) fetal abnormality and damage, fetal genitourinary anomalies, not applicable or unspecified: Secondary | ICD-10-CM

## 2018-03-02 ENCOUNTER — Ambulatory Visit (INDEPENDENT_AMBULATORY_CARE_PROVIDER_SITE_OTHER): Payer: 59

## 2018-03-02 ENCOUNTER — Encounter: Payer: Self-pay | Admitting: Women's Health

## 2018-03-02 ENCOUNTER — Other Ambulatory Visit: Payer: Self-pay | Admitting: Obstetrics and Gynecology

## 2018-03-02 ENCOUNTER — Ambulatory Visit (INDEPENDENT_AMBULATORY_CARE_PROVIDER_SITE_OTHER): Payer: 59 | Admitting: Women's Health

## 2018-03-02 VITALS — BP 120/80 | HR 95 | Wt 285.2 lb

## 2018-03-02 DIAGNOSIS — O0990 Supervision of high risk pregnancy, unspecified, unspecified trimester: Secondary | ICD-10-CM

## 2018-03-02 DIAGNOSIS — O35DXX0 Maternal care for other (suspected) fetal abnormality and damage, fetal gastrointestinal anomalies, not applicable or unspecified: Secondary | ICD-10-CM | POA: Insufficient documentation

## 2018-03-02 DIAGNOSIS — Z3A32 32 weeks gestation of pregnancy: Secondary | ICD-10-CM | POA: Diagnosis not present

## 2018-03-02 DIAGNOSIS — Z3480 Encounter for supervision of other normal pregnancy, unspecified trimester: Secondary | ICD-10-CM

## 2018-03-02 DIAGNOSIS — O358XX0 Maternal care for other (suspected) fetal abnormality and damage, not applicable or unspecified: Secondary | ICD-10-CM

## 2018-03-02 DIAGNOSIS — Z331 Pregnant state, incidental: Secondary | ICD-10-CM

## 2018-03-02 DIAGNOSIS — O0993 Supervision of high risk pregnancy, unspecified, third trimester: Secondary | ICD-10-CM

## 2018-03-02 DIAGNOSIS — O409XX0 Polyhydramnios, unspecified trimester, not applicable or unspecified: Secondary | ICD-10-CM | POA: Insufficient documentation

## 2018-03-02 DIAGNOSIS — O403XX0 Polyhydramnios, third trimester, not applicable or unspecified: Secondary | ICD-10-CM

## 2018-03-02 DIAGNOSIS — R350 Frequency of micturition: Secondary | ICD-10-CM

## 2018-03-02 DIAGNOSIS — O9989 Other specified diseases and conditions complicating pregnancy, childbirth and the puerperium: Secondary | ICD-10-CM

## 2018-03-02 DIAGNOSIS — O35EXX0 Maternal care for other (suspected) fetal abnormality and damage, fetal genitourinary anomalies, not applicable or unspecified: Secondary | ICD-10-CM

## 2018-03-02 DIAGNOSIS — Z1389 Encounter for screening for other disorder: Secondary | ICD-10-CM

## 2018-03-02 DIAGNOSIS — Z9884 Bariatric surgery status: Secondary | ICD-10-CM

## 2018-03-02 LAB — POCT URINALYSIS DIPSTICK
Blood, UA: NEGATIVE
Glucose, UA: NEGATIVE
Ketones, UA: NEGATIVE
Leukocytes, UA: NEGATIVE
Nitrite, UA: NEGATIVE
Protein, UA: NEGATIVE

## 2018-03-02 NOTE — Progress Notes (Addendum)
[place] 32+5 wks,breech,polyhydramnios,AFI 32 cm,BPP 8/8,normal ovaries bilat,distended stomach and duodenum "double bubble appearance",? duodenal atresia,fhr 130 bpm,left renal pelvis 5 mm WNL,normal right kidney,EFW 2077 g 46%

## 2018-03-02 NOTE — Patient Instructions (Addendum)
Lynn Roberts, I greatly value your feedback.  If you receive a survey following your visit with [place] today, we appreciate you taking the time to fill it out.  Thanks, Knute Neu, CNM, WHNP-BC  Fetal duodenal atresia  Polyhydramnios    Call the office [phone]) or go to [facility] if:  You begin to have strong, frequent contractions  Your water breaks.  Sometimes it is a big gush of fluid, sometimes it is just a trickle that keeps getting your panties wet or running down your legs  You have vaginal bleeding.  It is normal to have a small amount of spotting if your cervix was checked.   You don't feel your baby moving like normal.  If you don't, get you something to eat and drink and lay down and focus on feeling your baby move.  You should feel at least 10 movements in 2 hours.  If you don't, you should call the office or go to [facility].    Tdap Vaccine  It is recommended that you get the Tdap vaccine during the third trimester of EACH pregnancy to help protect your baby from getting pertussis (whooping cough)  27-36 weeks is the BEST time to do this so that you can pass the protection on to your baby. During pregnancy is better than after pregnancy, but if you are unable to get it during pregnancy it will be offered at the hospital.   You can get this vaccine at the health department or your family doctor  Everyone who will be around your baby should also be up-to-date on their vaccines. Adults (who are not pregnant) only need 1 dose of Tdap during adulthood.   Third Trimester of Pregnancy The third trimester is from week 29 through week 42, months 7 through 9. The third trimester is a time when the fetus is growing rapidly. At the end of the ninth month, the fetus is about 20 inches in length and weighs 6-10 pounds.  BODY CHANGES Your body goes through many changes during pregnancy. The changes vary from woman to woman.   Your weight will continue to increase. You  can expect to gain 25-35 pounds (11-16 kg) by the end of the pregnancy.  You may begin to get stretch marks on your hips, abdomen, and breasts.  You may urinate more often because the fetus is moving lower into your pelvis and pressing on your bladder.  You may develop or continue to have heartburn as a result of your pregnancy.  You may develop constipation because certain hormones are causing the muscles that push waste through your intestines to slow down.  You may develop hemorrhoids or swollen, bulging veins (varicose veins).  You may have pelvic pain because of the weight gain and pregnancy hormones relaxing your joints between the bones in your pelvis. Backaches may result from overexertion of the muscles supporting your posture.  You may have changes in your hair. These can include thickening of your hair, rapid growth, and changes in texture. Some women also have hair loss during or after pregnancy, or hair that feels dry or thin. Your hair will most likely return to normal after your baby is born.  Your breasts will continue to grow and be tender. A yellow discharge may leak from your breasts called colostrum.  Your belly button may stick out.  You may feel short of breath because of your expanding uterus.  You may notice the fetus "dropping," or moving lower in your  abdomen.  You may have a bloody mucus discharge. This usually occurs a few days to a week before labor begins.  Your cervix becomes thin and soft (effaced) near your due date. WHAT TO EXPECT AT YOUR PRENATAL EXAMS  You will have prenatal exams every 2 weeks until week 36. Then, you will have weekly prenatal exams. During a routine prenatal visit:  You will be weighed to make sure you and the fetus are growing normally.  Your blood pressure is taken.  Your abdomen will be measured to track your baby's growth.  The fetal heartbeat will be listened to.  Any test results from the previous visit will be  discussed.  You may have a cervical check near your due date to see if you have effaced. At around 36 weeks, your caregiver will check your cervix. At the same time, your caregiver will also perform a test on the secretions of the vaginal tissue. This test is to determine if a type of bacteria, Group B streptococcus, is present. Your caregiver will explain this further. Your caregiver may ask you:  What your birth plan is.  How you are feeling.  If you are feeling the baby move.  If you have had any abnormal symptoms, such as leaking fluid, bleeding, severe headaches, or abdominal cramping.  If you have any questions. Other tests or screenings that may be performed during your third trimester include:  Blood tests that check for low iron levels (anemia).  Fetal testing to check the health, activity level, and growth of the fetus. Testing is done if you have certain medical conditions or if there are problems during the pregnancy. FALSE LABOR You may feel small, irregular contractions that eventually go away. These are called Braxton Hicks contractions, or false labor. Contractions may last for hours, days, or even weeks before true labor sets in. If contractions come at regular intervals, intensify, or become painful, it is best to be seen by your caregiver.  SIGNS OF LABOR   Menstrual-like cramps.  Contractions that are 5 minutes apart or less.  Contractions that start on the top of the uterus and spread down to the lower abdomen and back.  A sense of increased pelvic pressure or back pain.  A watery or bloody mucus discharge that comes from the vagina. If you have any of these signs before the 37th week of pregnancy, call your caregiver right away. You need to go to the hospital to get checked immediately. HOME CARE INSTRUCTIONS   Avoid all smoking, herbs, alcohol, and unprescribed drugs. These chemicals affect the formation and growth of the baby.  Follow your caregiver's  instructions regarding medicine use. There are medicines that are either safe or unsafe to take during pregnancy.  Exercise only as directed by your caregiver. Experiencing uterine cramps is a good sign to stop exercising.  Continue to eat regular, healthy meals.  Wear a good support bra for breast tenderness.  Do not use hot tubs, steam rooms, or saunas.  Wear your seat belt at all times when driving.  Avoid raw meat, uncooked cheese, cat litter boxes, and soil used by cats. These carry germs that can cause birth defects in the baby.  Take your prenatal vitamins.  Try taking a stool softener (if your caregiver approves) if you develop constipation. Eat more high-fiber foods, such as fresh vegetables or fruit and whole grains. Drink plenty of fluids to keep your urine clear or pale yellow.  Take warm sitz baths to soothe  any pain or discomfort caused by hemorrhoids. Use hemorrhoid cream if your caregiver approves.  If you develop varicose veins, wear support hose. Elevate your feet for 15 minutes, 3-4 times a day. Limit salt in your diet.  Avoid heavy lifting, wear low heal shoes, and practice good posture.  Rest a lot with your legs elevated if you have leg cramps or low back pain.  Visit your dentist if you have not gone during your pregnancy. Use a soft toothbrush to brush your teeth and be gentle when you floss.  A sexual relationship may be continued unless your caregiver directs you otherwise.  Do not travel far distances unless it is absolutely necessary and only with the approval of your caregiver.  Take prenatal classes to understand, practice, and ask questions about the labor and delivery.  Make a trial run to the hospital.  Pack your hospital bag.  Prepare the baby's nursery.  Continue to go to all your prenatal visits as directed by your caregiver. SEEK MEDICAL CARE IF:  You are unsure if you are in labor or if your water has broken.  You have  dizziness.  You have mild pelvic cramps, pelvic pressure, or nagging pain in your abdominal area.  You have persistent nausea, vomiting, or diarrhea.  You have a bad smelling vaginal discharge.  You have pain with urination. SEEK IMMEDIATE MEDICAL CARE IF:   You have a fever.  You are leaking fluid from your vagina.  You have spotting or bleeding from your vagina.  You have severe abdominal cramping or pain.  You have rapid weight loss or gain.  You have shortness of breath with chest pain.  You notice sudden or extreme swelling of your face, hands, ankles, feet, or legs.  You have not felt your baby move in over an hour.  You have severe headaches that do not go away with medicine.  You have vision changes. Document Released: 11/12/2001 Document Revised: 11/23/2013 Document Reviewed: 01/19/2013 [facility] Patient Information 2015 [place], [place]. This information is not intended to replace advice given to you by your health care provider. Make sure you discuss any questions you have with your health care provider.

## 2018-03-02 NOTE — Progress Notes (Signed)
HIGH-RISK PREGNANCY VISIT Patient name: Lynn Roberts MRN [id number]  Date of birth: 1986-10-03 Chief Complaint:   Routine Prenatal Visit ([place] today)  History of Present Illness:   Lynn Roberts is a 32 y.o. G2P0010 female at [redacted]w[redacted]d with an Estimated Date of Delivery: 04/22/18 being seen today for ongoing management of a high-risk pregnancy complicated by fetal duodenal atresia and polyhydramnios dx today. U/S done for recheck of Lt pyelectasis seen at anatomy u/s- this has resolved.  Today she reports some mild increase in urinary frequency. Contractions: Not present.  .  Movement: Present. denies leaking of fluid.  Review of Systems:   Pertinent items are noted in HPI Denies abnormal vaginal discharge w/ itching/odor/irritation, headaches, visual changes, shortness of breath, chest pain, abdominal pain, severe nausea/vomiting, or problems with urination or bowel movements unless otherwise stated above. Pertinent History Reviewed:  Reviewed past medical,surgical, social, obstetrical and family history.  Reviewed problem list, medications and allergies. Physical Assessment:   Vitals:   03/02/18 1202  BP: 120/80  Pulse: 95  Weight: 285 lb 3.2 oz (129.4 kg)  Body mass index is 39.78 kg/m.           Physical Examination:   General appearance: alert, well appearing, and in no distress  Mental status: alert, oriented to person, place, and time  Skin: warm & dry   Extremities: Edema: Trace    Cardiovascular: normal heart rate noted  Respiratory: normal respiratory effort, no distress  Abdomen: gravid, soft, non-tender  Pelvic: Cervical exam deferred         Fetal Status: Fetal Heart Rate (bpm): 130 u/s   Movement: Present    Fetal Surveillance Testing today: [place] 32+5 wks,breech,polyhydramnios,AFI 32 cm,BPP 8/8,normal ovaries bilat,distended stomach and duodenum "double bubble appearance",?duodenal atresia,fhr 130 bpm,left renal pelvis 5 mm WNL,normal right kidney,EFW 2077 g 46%,Dr  Eure review images w/pt.    Results for orders placed or performed in visit on 03/02/18 (from the past 24 hour(s))  POCT urinalysis dipstick   Collection Time: 03/02/18 11:11 AM  Result Value Ref Range   Color, UA     Clarity, UA     Glucose, UA neg    Bilirubin, UA     Ketones, UA neg    Spec Grav, UA  1.010 - 1.025   Blood, UA neg    pH, UA  5.0 - 8.0   Protein, UA neg    Urobilinogen, UA  0.2 or 1.0 E.U./dL   Nitrite, UA neg    Leukocytes, UA Negative Negative   Appearance     Odor      Assessment & Plan:  1) High-risk pregnancy G2P0010 at [redacted]w[redacted]d with an Estimated Date of Delivery: 04/22/18   2) Fetal duodenal atresia, dx today, per [facility] needs referral to [facility] MFM (sent today per [facility]), will need to deliver @ [place], can continue pnc/antenatal testing here  3) Polyhydramnios, dx today, AFI 32cm  4) Mild urinary frequency> will send urine cx  Meds: No orders of the defined types were placed in this encounter.  Labs/procedures today: u/s  Treatment Plan:  Refer to [facility] MFM, deliver @ [place], U/S @  36wks  2x/wk testing @ 32wks or weekly BPP    Deliver @ 39wks  Reviewed: Preterm labor symptoms and general obstetric precautions including but not limited to vaginal bleeding, contractions, leaking of fluid and fetal movement were reviewed in detail with the patient.  All questions were answered.  Follow-up: Return for Thurs for HROB/NST,  then [facility] for HROB/NST x 4wks.  Orders Placed This Encounter  Procedures  . Urine Culture  . AMB referral to maternal fetal medicine  . POCT urinalysis dipstick   Roma Schanz CNM, [facility] 03/02/2018 5:12 PM

## 2018-03-04 ENCOUNTER — Telehealth: Payer: Self-pay | Admitting: *Deleted

## 2018-03-04 ENCOUNTER — Other Ambulatory Visit: Payer: Self-pay | Admitting: Women's Health

## 2018-03-04 ENCOUNTER — Encounter: Payer: Self-pay | Admitting: Women's Health

## 2018-03-04 DIAGNOSIS — O2343 Unspecified infection of urinary tract in pregnancy, third trimester: Secondary | ICD-10-CM | POA: Insufficient documentation

## 2018-03-04 LAB — URINE CULTURE

## 2018-03-04 MED ORDER — NITROFURANTOIN MONOHYD MACRO 100 MG PO CAPS: 100.0000 mg | ORAL_CAPSULE | Freq: Two times a day (BID) | ORAL | 0 refills | Status: DC

## 2018-03-04 MED ORDER — POLYETHYLENE GLYCOL 3350 17 G PO PACK: 17.0000 g | PACK | Freq: Every day | ORAL | 0 refills | Status: DC

## 2018-03-04 NOTE — Telephone Encounter (Signed)
LMOVM that urine culture showed UTI and prescription was sent to pharmacy.  Advised to take all of medication even if symptoms are gone.

## 2018-03-04 NOTE — Telephone Encounter (Signed)
Patient informed of UTI.  Requesting refill on Miralax powder.

## 2018-03-05 ENCOUNTER — Encounter: Payer: Self-pay | Admitting: Advanced Practice Midwife

## 2018-03-05 ENCOUNTER — Ambulatory Visit (INDEPENDENT_AMBULATORY_CARE_PROVIDER_SITE_OTHER): Payer: 59 | Admitting: Advanced Practice Midwife

## 2018-03-05 VITALS — BP 120/72 | HR 75 | Wt 285.0 lb

## 2018-03-05 DIAGNOSIS — O358XX0 Maternal care for other (suspected) fetal abnormality and damage, not applicable or unspecified: Secondary | ICD-10-CM | POA: Diagnosis not present

## 2018-03-05 DIAGNOSIS — O403XX0 Polyhydramnios, third trimester, not applicable or unspecified: Secondary | ICD-10-CM

## 2018-03-05 DIAGNOSIS — Z3A33 33 weeks gestation of pregnancy: Secondary | ICD-10-CM | POA: Diagnosis not present

## 2018-03-05 DIAGNOSIS — O0990 Supervision of high risk pregnancy, unspecified, unspecified trimester: Secondary | ICD-10-CM

## 2018-03-05 DIAGNOSIS — Q410 Congenital absence, atresia and stenosis of duodenum: Secondary | ICD-10-CM

## 2018-03-05 DIAGNOSIS — Z331 Pregnant state, incidental: Secondary | ICD-10-CM

## 2018-03-05 DIAGNOSIS — Z9884 Bariatric surgery status: Secondary | ICD-10-CM

## 2018-03-05 DIAGNOSIS — Z1389 Encounter for screening for other disorder: Secondary | ICD-10-CM

## 2018-03-05 LAB — POCT URINALYSIS DIPSTICK
Blood, UA: NEGATIVE
Glucose, UA: NEGATIVE
Ketones, UA: NEGATIVE
Leukocytes, UA: NEGATIVE
Nitrite, UA: NEGATIVE
Protein, UA: NEGATIVE

## 2018-03-05 NOTE — Progress Notes (Signed)
HIGH-RISK PREGNANCY VISIT Patient name: Lynn Roberts MRN [id number]  Date of birth: November 15, 1986 Chief Complaint:   Routine Prenatal Visit (nst)  History of Present Illness:   Lynn Roberts is a 32 y.o. G2P0010 female at [redacted]w[redacted]d with an Estimated Date of Delivery: 04/22/18 being seen today for ongoing management of a high-risk pregnancy complicated by duodenal atresia, dx 32 weeks.  Today she reports no complaints. Contractions: Not present. Vag. Bleeding: None.  Movement: Present. denies leaking of fluid.  Review of Systems:   Pertinent items are noted in HPI Denies abnormal vaginal discharge w/ itching/odor/irritation, headaches, visual changes, shortness of breath, chest pain, abdominal pain, severe nausea/vomiting, or problems with urination or bowel movements unless otherwise stated above.    Pertinent History Reviewed:  Medical & Surgical Hx:   Past Medical History:  Diagnosis Date  . Arthritis of low back   . Complication of anesthesia    is of [name] descent  . GERD (gastroesophageal reflux disease)   . History of PAT (paroxysmal atrial tachycardia)    had cardiac ablation as a teenager  . Migraines   . Miscarriage 04/16/2016  . Obesity   . Papilloma of breast 10/2014   left  . PCO (polycystic ovaries) 07/14/2017  . PCOD (polycystic ovarian disease)    no menses  . Supervision of normal pregnancy 04/09/2016    [facility] Initiated Care at   7+1 week FOB  Lynn Roberts Dating By  [place]  Pap  04/09/16 GC/CT Initial:                36+wks: Genetic Screen NT/IT:  CF screen  Anatomic [place]  Flu vaccine  Tdap Recommended ~ 28wks Glucose Screen  2 hr GBS  Feed Preference  Contraception  Circumcision  Childbirth Classes  Pediatrician     Past Surgical History:  Procedure Laterality Date  . BREAST LUMPECTOMY Left 2011   papilloma  . BREAST LUMPECTOMY WITH RADIOACTIVE SEED LOCALIZATION Left 10/18/2014   Procedure:  RADIOACTIVE SEED GUIDED EXCISIONAL BREAST BIOPSY;  Surgeon: Alphonsa Overall, MD;  Location: [facility];  Service: General;  Laterality: Left;  . BREAST SURGERY Right 07/27/2004   retro-areolar dissection with exc. of large duct  . CARDIAC ELECTROPHYSIOLOGY STUDY AND ABLATION  age 41  . CHOLECYSTECTOMY  04/27/2008  . GASTROPLASTY DUODENAL SWITCH  01/2014  . OVARIAN CYST REMOVAL Bilateral   . TOENAIL EXCISION    . UNILATERAL SALPINGECTOMY     Family History  Problem Relation Age of Onset  . Cancer Paternal Grandfather   . Cancer Maternal Grandmother   . Diabetes Maternal Grandfather   . Hypertension Father   . Hyperlipidemia Father   . Hypertension Mother   . Diabetes Mother     Current Outpatient Medications:  .  nitrofurantoin, macrocrystal-monohydrate, (MACROBID) 100 MG capsule, Take 1 capsule (100 mg total) by mouth 2 (two) times daily. X 7 days, Disp: 14 capsule, Rfl: 0 .  polyethylene glycol (MIRALAX) packet, Take 17 g by mouth daily., Disp: 30 each, Rfl: 0 .  prenatal vitamin w/FE, FA (PRENATAL 1 + 1) 27-1 MG TABS tablet, Take 1 tablet by mouth daily at 12 noon., Disp: 30 each, Rfl: 12 .  ranitidine (ZANTAC 75) 75 MG tablet, Take 1 tablet (75 mg total) by mouth 2 (two) times daily., Disp: 60 tablet, Rfl: 0 Social History: Reviewed -  reports that she has been smoking cigarettes.  She has a 5.00 pack-year smoking history. She has never  used smokeless tobacco.   Physical Assessment:   Vitals:   03/05/18 1115  BP: 120/72  Pulse: 75  Weight: 285 lb (129.3 kg)  Body mass index is 39.75 kg/m.           Physical Examination:   General appearance: alert, well appearing, and in no distress  Mental status: alert, oriented to person, place, and time  Skin: warm & dry   Extremities: Edema: Trace    Cardiovascular: normal heart rate noted  Respiratory: normal respiratory effort, no distress  Abdomen: gravid, soft, non-tender  Pelvic: Cervical exam deferred         Fetal Status:     Movement: Present    Fetal Surveillance Testing  today: NST: FHR baseline 145 bpm, Variability: moderate, Accelerations:present, Decelerations:  Absent= Cat 1/Reactive (borderline) Problem keeping tracing d/t baby being so active.  1-2 definite accels then baby got so active we never could trace again.  BPP 8/8 on Monday, so still reassuring fetal status   Results for orders placed or performed in visit on 03/05/18 (from the past 24 hour(s))  POCT urinalysis dipstick   Collection Time: 03/05/18 11:15 AM  Result Value Ref Range   Color, UA     Clarity, UA     Glucose, UA neg    Bilirubin, UA     Ketones, UA neg    Spec Grav, UA  1.010 - 1.025   Blood, UA neg    pH, UA  5.0 - 8.0   Protein, UA neg    Urobilinogen, UA  0.2 or 1.0 E.U./dL   Nitrite, UA neg    Leukocytes, UA Negative Negative   Appearance     Odor      Assessment & Plan:  1) High-risk pregnancy G2P0010 at [redacted]w[redacted]d with an Estimated Date of Delivery: 04/22/18   2) Fetal duodenal atresia ; appt w/[facility] (hasnt heard yet, if no appt by Monday we can call and check).  To deliver there  3) Polyhydramnios,   Labs/procedures today: NST  Medications: none  Treatment Plan:  Weekly BPP (pt preference, she still works, Mondays are best),   Follow-up: Return for weekly BPPs (Mondays) /HROB.  Orders Placed This Encounter  Procedures  . POCT urinalysis dipstick   Christin Fudge CNM 03/05/2018 3:42 PM

## 2018-03-06 ENCOUNTER — Other Ambulatory Visit (HOSPITAL_COMMUNITY): Payer: Self-pay | Admitting: Advanced Practice Midwife

## 2018-03-06 DIAGNOSIS — O403XX0 Polyhydramnios, third trimester, not applicable or unspecified: Secondary | ICD-10-CM

## 2018-03-09 ENCOUNTER — Ambulatory Visit (INDEPENDENT_AMBULATORY_CARE_PROVIDER_SITE_OTHER): Payer: 59 | Admitting: Obstetrics and Gynecology

## 2018-03-09 ENCOUNTER — Encounter: Payer: Self-pay | Admitting: Obstetrics and Gynecology

## 2018-03-09 ENCOUNTER — Other Ambulatory Visit: Payer: 59 | Admitting: Obstetrics and Gynecology

## 2018-03-09 ENCOUNTER — Other Ambulatory Visit: Payer: Self-pay

## 2018-03-09 ENCOUNTER — Ambulatory Visit (INDEPENDENT_AMBULATORY_CARE_PROVIDER_SITE_OTHER): Payer: 59

## 2018-03-09 VITALS — BP 110/70 | HR 81 | Wt 284.0 lb

## 2018-03-09 DIAGNOSIS — Z9884 Bariatric surgery status: Secondary | ICD-10-CM

## 2018-03-09 DIAGNOSIS — Z1389 Encounter for screening for other disorder: Secondary | ICD-10-CM

## 2018-03-09 DIAGNOSIS — Z331 Pregnant state, incidental: Secondary | ICD-10-CM

## 2018-03-09 DIAGNOSIS — O403XX0 Polyhydramnios, third trimester, not applicable or unspecified: Secondary | ICD-10-CM

## 2018-03-09 DIAGNOSIS — O0993 Supervision of high risk pregnancy, unspecified, third trimester: Secondary | ICD-10-CM

## 2018-03-09 DIAGNOSIS — Z87440 Personal history of urinary (tract) infections: Secondary | ICD-10-CM

## 2018-03-09 DIAGNOSIS — Z3A33 33 weeks gestation of pregnancy: Secondary | ICD-10-CM

## 2018-03-09 DIAGNOSIS — Q410 Congenital absence, atresia and stenosis of duodenum: Secondary | ICD-10-CM | POA: Diagnosis not present

## 2018-03-09 DIAGNOSIS — O0990 Supervision of high risk pregnancy, unspecified, unspecified trimester: Secondary | ICD-10-CM

## 2018-03-09 LAB — POCT URINALYSIS DIPSTICK
Blood, UA: NEGATIVE
Glucose, UA: NEGATIVE
Ketones, UA: NEGATIVE
Leukocytes, UA: NEGATIVE
Nitrite, UA: NEGATIVE
Protein, UA: NEGATIVE

## 2018-03-09 MED ORDER — BETAMETHASONE SOD PHOS & ACET 6 (3-3) MG/ML IJ SUSP
12.0000 mg | Freq: Once | INTRAMUSCULAR | Status: AC
  Administered 2018-03-09: 12 mg via INTRAMUSCULAR

## 2018-03-09 NOTE — Progress Notes (Signed)
Pt given betamethasone 12mg  IM left VG without complications. Advised to return tomorrow for next injection.

## 2018-03-09 NOTE — Progress Notes (Signed)
[place] 37+7 wks,cephalic,BPP 9/3,PSUGAYGE pl gr 1,bilat adnexa's wnl,polyhydramnios,AFI 54 cm,fhr 152 bpm,distended stomach and duodenum

## 2018-03-09 NOTE — Progress Notes (Addendum)
Patient ID: MARRIE CHANDRA, female   DOB: [birth year], 32 y.o.   MRN: [id number]   [facility] PREGNANCY VISIT Patient name: Lynn Roberts MRN [id number]  Date of birth: [birth year] Chief Complaint:   High Risk Gestation (u/s today)  History of Present Illness:   Lynn Roberts is a 32 y.o. 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  She has not been seen at [facility] yet but will be referred there she reports being unable to contact them last week over concerns of the pelvic pressure She did not seek genetic testing at the onset of pregnancy, as I review her records  She continues to work I am taking her to reduced hours this week and out of work next week as I see this condition progressing Today she reports abdominal pressure. Contractions: Not present. Vag. Bleeding: None.  Movement: Present. denies leaking of fluid.  Review of Systems:    Pertinent History Reviewed:  Reviewed past medical,surgical, social, obstetrical and family history.  Reviewed problem list, medications and allergies. Physical Assessment:   Vitals:   03/09/18 1220  BP: 110/70  Pulse: 81  Weight: 284 lb (128.8 kg)  Body mass index is 39.61 kg/m.           Physical Examination:   General appearance: alert, well appearing, and in no distress, oriented to person, place, and time and overweight  Mental status: alert, oriented to person, place, and time, normal mood, behavior, speech, dress, motor activity, and thought processes, affect appropriate to mood  Skin: warm & dry   Extremities: Edema: Trace    Cardiovascular: normal heart rate noted  Respiratory: normal respiratory effort, no distress  Abdomen: gravid, soft, non-tender size markedly increased since last week patient somewhat uncomfortable.  No respiratory compromise  Pelvic: Cervical exam deferred                           Obstetric ultrasound done today confirming AFI of 55 Fetal Status:     Movement: Present    Fetal Surveillance Testing today: U/S: [place] 21+1 wks,cephalic,BPP 9/4,RDEYCXKG pl gr 1,bilat adnexa's wnl,polyhydramnios,AFI 54 cm,fhr 152 bpm,distended stomach and duodenum   Results for orders placed or performed in visit on 03/09/18 (from the past 24 hour(s))  POCT urinalysis dipstick   Collection Time: 03/09/18 11:50 AM  Result Value Ref Range   Color, UA     Clarity, UA     Glucose, UA neg    Bilirubin, UA     Ketones, UA neg    Spec Grav, UA  1.010 - 1.025   Blood, UA neg    pH, UA  5.0 - 8.0   Protein, UA neg    Urobilinogen, UA  0.2 or 1.0 E.U./dL   Nitrite, UA neg    Leukocytes, UA Negative Negative   Appearance     Odor      Assessment & Plan:  1) High-risk pregnancy G2P0010 at [redacted]w[redacted]d with an Estimated Date of Delivery: 04/22/18   2) Duodenal Atresia, stable, with marked polyhydramios, Will deliver at [facility], has not been seen there yet.  3) marked polyhydramnios AFI 55 today (4/8), increasing.      Has increased from AFI 32 last week, will consult re steroids and advancing appointment. 4  Hx gastric bypass with weight loss of 400+ pounds. Meds: No  orders of the defined types were placed in this encounter.   Labs/procedures today: U/S, confirming progression of polyhydramnios, and first betamethasone dose given today  Treatment Plan:  Weekly BPP, Mondays are best,  Will give BMZ 12 mg today and tomorrow, place pt out of work after this week.  Follow-up: No follow-ups on file.  Betamethasone injection second injection in a.m., with phone call to maternal fetal medicine, Renella Cunas MD, who will expedite referral to [facility] for maternal fetal medicine consult.  Orders Placed This Encounter  Procedures  . Urine Culture  . POCT urinalysis dipstick   By signing my name below, I, Margit Banda, attest that this documentation has been prepared under the  direction and in the presence of Jonnie Kind, MD. Electronically Signed: Margit Banda, Medical Scribe. 03/09/18. 12:26 PM.  I personally performed the services described in this documentation, which was SCRIBED in my presence. The recorded information has been reviewed and considered accurate. It has been edited as necessary during review. Jonnie Kind, MD

## 2018-03-10 ENCOUNTER — Other Ambulatory Visit: Payer: Self-pay | Admitting: Obstetrics & Gynecology

## 2018-03-10 ENCOUNTER — Ambulatory Visit (INDEPENDENT_AMBULATORY_CARE_PROVIDER_SITE_OTHER): Payer: 59 | Admitting: *Deleted

## 2018-03-10 ENCOUNTER — Other Ambulatory Visit (INDEPENDENT_AMBULATORY_CARE_PROVIDER_SITE_OTHER): Payer: 59

## 2018-03-10 VITALS — BP 124/80

## 2018-03-10 DIAGNOSIS — O403XX0 Polyhydramnios, third trimester, not applicable or unspecified: Secondary | ICD-10-CM | POA: Diagnosis not present

## 2018-03-10 DIAGNOSIS — Z9884 Bariatric surgery status: Secondary | ICD-10-CM

## 2018-03-10 DIAGNOSIS — O0990 Supervision of high risk pregnancy, unspecified, unspecified trimester: Secondary | ICD-10-CM

## 2018-03-10 DIAGNOSIS — O368130 Decreased fetal movements, third trimester, not applicable or unspecified: Secondary | ICD-10-CM

## 2018-03-10 DIAGNOSIS — Z3A33 33 weeks gestation of pregnancy: Secondary | ICD-10-CM

## 2018-03-10 MED ORDER — BETAMETHASONE SOD PHOS & ACET 6 (3-3) MG/ML IJ SUSP
12.0000 mg | Freq: Once | INTRAMUSCULAR | Status: AC
  Administered 2018-03-10: 12 mg via INTRAMUSCULAR

## 2018-03-10 NOTE — Progress Notes (Signed)
[place] 33+7 wks,cephalic,fht 445 bpm,polyhydramnios,afi 47 cm,anterior pl gr 1,distended stomach,duodenum and esophagus,positive fetal movement and tone

## 2018-03-10 NOTE — Progress Notes (Signed)
Pt in for second dose of betamethasone. Given IM in right gluteal. Patient tolerated well. Pt informed me that she had not felt baby move today. I discussed this with Dr. Elonda Husky and he recommended that Amber scan patient to reassure her. Pt taken to [facility].

## 2018-03-11 ENCOUNTER — Other Ambulatory Visit: Payer: Self-pay | Admitting: Obstetrics and Gynecology

## 2018-03-11 ENCOUNTER — Telehealth: Payer: Self-pay | Admitting: Obstetrics and Gynecology

## 2018-03-11 LAB — URINE CULTURE

## 2018-03-11 MED ORDER — CEPHALEXIN 500 MG PO CAPS: 500.0000 mg | ORAL_CAPSULE | Freq: Four times a day (QID) | ORAL | 0 refills | Status: DC

## 2018-03-11 NOTE — Telephone Encounter (Signed)
Patient contacted by phone.  She reports that she has genetics counseling appointment in [place] next Wednesday, has BPP scheduled here next Monday, and has an appointment at 38 weeks to see the physician in [facility].  I have encouraged her to discuss with a genetics counselor the likelihood that she needed to be seen earlier as the risk for preterm delivery is very high

## 2018-03-12 ENCOUNTER — Other Ambulatory Visit: Payer: 59 | Admitting: Obstetrics and Gynecology

## 2018-03-12 ENCOUNTER — Encounter: Payer: Self-pay | Admitting: *Deleted

## 2018-03-13 ENCOUNTER — Other Ambulatory Visit: Payer: Self-pay | Admitting: Obstetrics and Gynecology

## 2018-03-13 DIAGNOSIS — O403XX0 Polyhydramnios, third trimester, not applicable or unspecified: Secondary | ICD-10-CM

## 2018-03-13 DIAGNOSIS — Q410 Congenital absence, atresia and stenosis of duodenum: Secondary | ICD-10-CM

## 2018-03-16 ENCOUNTER — Other Ambulatory Visit: Payer: Self-pay

## 2018-03-16 ENCOUNTER — Other Ambulatory Visit: Payer: 59 | Admitting: Obstetrics & Gynecology

## 2018-03-16 ENCOUNTER — Ambulatory Visit (INDEPENDENT_AMBULATORY_CARE_PROVIDER_SITE_OTHER): Payer: 59

## 2018-03-16 ENCOUNTER — Encounter: Payer: Self-pay | Admitting: Obstetrics & Gynecology

## 2018-03-16 ENCOUNTER — Ambulatory Visit (INDEPENDENT_AMBULATORY_CARE_PROVIDER_SITE_OTHER): Payer: 59 | Admitting: Obstetrics & Gynecology

## 2018-03-16 VITALS — BP 128/80 | HR 71 | Wt 291.0 lb

## 2018-03-16 DIAGNOSIS — Z3A34 34 weeks gestation of pregnancy: Secondary | ICD-10-CM | POA: Diagnosis not present

## 2018-03-16 DIAGNOSIS — Q410 Congenital absence, atresia and stenosis of duodenum: Secondary | ICD-10-CM | POA: Diagnosis not present

## 2018-03-16 DIAGNOSIS — O35DXX0 Maternal care for other (suspected) fetal abnormality and damage, fetal gastrointestinal anomalies, not applicable or unspecified: Secondary | ICD-10-CM

## 2018-03-16 DIAGNOSIS — O0990 Supervision of high risk pregnancy, unspecified, unspecified trimester: Secondary | ICD-10-CM

## 2018-03-16 DIAGNOSIS — O403XX0 Polyhydramnios, third trimester, not applicable or unspecified: Secondary | ICD-10-CM

## 2018-03-16 DIAGNOSIS — Z331 Pregnant state, incidental: Secondary | ICD-10-CM

## 2018-03-16 DIAGNOSIS — O0993 Supervision of high risk pregnancy, unspecified, third trimester: Secondary | ICD-10-CM

## 2018-03-16 DIAGNOSIS — Z9884 Bariatric surgery status: Secondary | ICD-10-CM

## 2018-03-16 DIAGNOSIS — O358XX0 Maternal care for other (suspected) fetal abnormality and damage, not applicable or unspecified: Secondary | ICD-10-CM

## 2018-03-16 DIAGNOSIS — Z1389 Encounter for screening for other disorder: Secondary | ICD-10-CM

## 2018-03-16 LAB — POCT URINALYSIS DIPSTICK
Blood, UA: NEGATIVE
Glucose, UA: NEGATIVE
Ketones, UA: NEGATIVE
Leukocytes, UA: NEGATIVE
Nitrite, UA: NEGATIVE
Protein, UA: NEGATIVE

## 2018-03-16 NOTE — Progress Notes (Signed)
[place] 91+7 wks,cephalic,fhr 915 bpm,BPP 0/5,WPV 54 cm,polyhydramnios,bilat adnexa's wnl,anterior pl gr 1,distended stomach and duodenum

## 2018-03-16 NOTE — Progress Notes (Signed)
   HIGH-RISK PREGNANCY VISIT Patient name: Lynn Roberts MRN [id number]  Date of birth: 02/09/1986 Chief Complaint:   High Risk Gestation (u/s today; c/o SOB)  History of Present Illness:   Lynn Roberts is a 32 y.o. 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  Today she reports no complaints. Contractions: Irregular. Vag. Bleeding: None.  Movement: Present. denies leaking of fluid.  Review of Systems:   Pertinent items are noted in HPI Denies abnormal vaginal discharge w/ itching/odor/irritation, headaches, visual changes, shortness of breath, chest pain, abdominal pain, severe nausea/vomiting, or problems with urination or bowel movements unless otherwise stated above. Pertinent History Reviewed:  Reviewed past medical,surgical, social, obstetrical and family history.  Reviewed problem list, medications and allergies. Physical Assessment:   Vitals:   03/16/18 1037  BP: 128/80  Pulse: 71  Weight: 291 lb (132 kg)  Body mass index is 40.59 kg/m.           Physical Examination:   General appearance: alert, well appearing, and in no distress  Mental status: alert, oriented to person, place, and time  Skin: warm & dry   Extremities: Edema: Trace    Cardiovascular: normal heart rate noted  Respiratory: normal respiratory effort, no distress  Abdomen: gravid, soft, non-tender  Pelvic: Cervical exam deferred         Fetal Status:     Movement: Present    Fetal Surveillance Testing today: BPP 8/8   No results found for this or any previous visit (from the past 24 hour(s)).  Assessment & Plan:  1) High-risk pregnancy G2P0010 at [redacted]w[redacted]d with an Estimated Date of Delivery: 04/22/18   2) Duodenal atresia with polyhydramnios, unstable    Meds: No orders of the defined types were placed in this encounter.   Labs/procedures today: BPP  8/8  Treatment Plan:  Going to [facility] for eval in 2 days, will see back in 1 week for fetal surveillance  Reviewed: Preterm labor symptoms and general obstetric precautions including but not limited to vaginal bleeding, contractions, leaking of fluid and fetal movement were reviewed in detail with the patient.  All questions were answered.  Follow-up: Return if symptoms worsen or fail to improve, for will transfer to [facility].  Orders Placed This Encounter  Procedures  . POCT urinalysis dipstick   Florian Buff 08/03/2018 4:08 PM

## 2018-03-18 ENCOUNTER — Other Ambulatory Visit (HOSPITAL_COMMUNITY): Payer: 59

## 2018-03-18 ENCOUNTER — Encounter (HOSPITAL_COMMUNITY): Payer: 59

## 2018-03-19 ENCOUNTER — Other Ambulatory Visit: Payer: 59 | Admitting: Advanced Practice Midwife

## 2018-03-19 ENCOUNTER — Other Ambulatory Visit: Payer: Self-pay | Admitting: Obstetrics and Gynecology

## 2018-03-19 DIAGNOSIS — O403XX0 Polyhydramnios, third trimester, not applicable or unspecified: Secondary | ICD-10-CM

## 2018-03-19 DIAGNOSIS — Q410 Congenital absence, atresia and stenosis of duodenum: Secondary | ICD-10-CM

## 2018-03-23 ENCOUNTER — Ambulatory Visit (INDEPENDENT_AMBULATORY_CARE_PROVIDER_SITE_OTHER): Payer: 59 | Admitting: Obstetrics and Gynecology

## 2018-03-23 ENCOUNTER — Ambulatory Visit (INDEPENDENT_AMBULATORY_CARE_PROVIDER_SITE_OTHER): Payer: 59

## 2018-03-23 ENCOUNTER — Other Ambulatory Visit: Payer: 59 | Admitting: Obstetrics & Gynecology

## 2018-03-23 VITALS — BP 114/74 | HR 80 | Wt 278.0 lb

## 2018-03-23 DIAGNOSIS — Q410 Congenital absence, atresia and stenosis of duodenum: Secondary | ICD-10-CM

## 2018-03-23 DIAGNOSIS — O0990 Supervision of high risk pregnancy, unspecified, unspecified trimester: Secondary | ICD-10-CM

## 2018-03-23 DIAGNOSIS — O403XX0 Polyhydramnios, third trimester, not applicable or unspecified: Secondary | ICD-10-CM | POA: Diagnosis not present

## 2018-03-23 DIAGNOSIS — Z331 Pregnant state, incidental: Secondary | ICD-10-CM

## 2018-03-23 DIAGNOSIS — Z1389 Encounter for screening for other disorder: Secondary | ICD-10-CM

## 2018-03-23 DIAGNOSIS — Z9884 Bariatric surgery status: Secondary | ICD-10-CM

## 2018-03-23 DIAGNOSIS — Z3A35 35 weeks gestation of pregnancy: Secondary | ICD-10-CM | POA: Diagnosis not present

## 2018-03-23 DIAGNOSIS — O0993 Supervision of high risk pregnancy, unspecified, third trimester: Secondary | ICD-10-CM

## 2018-03-23 LAB — POCT URINALYSIS DIPSTICK
Blood, UA: NEGATIVE
Glucose, UA: NEGATIVE
Ketones, UA: NEGATIVE
Leukocytes, UA: NEGATIVE
Nitrite, UA: NEGATIVE
Protein, UA: NEGATIVE

## 2018-03-23 NOTE — Progress Notes (Addendum)
HIGH-RISK PREGNANCY VISIT Patient name: Lynn Roberts MRN [id number]  Date of birth: October 14, 1986 Chief Complaint:   High Risk Gestation (BPP)  History of Present Illness:   Lynn Roberts is a 32 y.o. 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  Today she reports weight loss of 13 lbs, but she does not percieve any habitus changes in edema or abd girth.. Contractions: Not present. Vag. Bleeding: None.  Movement: Present. She denies leaking of fluid and any other symptoms.   She will be able to meet her surgeon on 4/25 who will be at [facility]. She notes that she was in discussion with transferring her care to a provider at [facility]. She voices concern for if she will be able to use hemorrhoid suppository creams.   Review of Systems:   Pertinent items are noted in HPI Denies abnormal vaginal discharge w/ itching/odor/irritation, headaches, visual changes, shortness of breath, chest pain, abdominal pain, severe nausea/vomiting, or problems with urination or bowel movements unless otherwise stated above. Pertinent History Reviewed:  Reviewed past medical,surgical, social, obstetrical and family history.  Reviewed problem list, medications and allergies. Physical Assessment:   Vitals:   03/23/18 1041  BP: 114/74  Pulse: 80  Weight: 278 lb (126.1 kg)  Body mass index is 38.77 kg/m.           Physical Examination:   General appearance: alert, well appearing, and in no distress  Mental status: alert, oriented to person, place, and time  Skin: warm & dry   Extremities: Edema: Trace    Cardiovascular: normal heart rate noted  Respiratory: normal respiratory effort, no distress  Abdomen: gravid, soft, non-tender  Pelvic: Cervical exam deferred         Fetal Status:     Movement: Present  FHR via [place]: 135 FH: 52 cm  Fetal Surveillance Testing  today: BPP   Results for orders placed or performed in visit on 03/23/18 (from the past 24 hour(s))  POCT Urinalysis Dipstick   Collection Time: 03/23/18 10:49 AM  Result Value Ref Range   Color, UA     Clarity, UA     Glucose, UA neg    Bilirubin, UA     Ketones, UA neg    Spec Grav, UA  1.010 - 1.025   Blood, UA neg    pH, UA  5.0 - 8.0   Protein, UA neg    Urobilinogen, UA  0.2 or 1.0 E.U./dL   Nitrite, UA neg    Leukocytes, UA Negative Negative   Appearance     Odor      Assessment & Plan:  1) High-risk pregnancy G2P0010 at [redacted]w[redacted]d with an Estimated Date of Delivery: 04/22/18   2) duodenal atresia with severe polyhydramnios, stable current AFI 56  3. Fetal malpresentation BREECH, to discuss ECV with [place] this Thursday,(may not be an option due to polyhydramnios) Versus scheduled primary cesareanat 39 weeks, .at [facility].      Meds: No orders of the defined types were placed in this encounter.   Labs/procedures today: BPP 8/8  Treatment Plan:  [place]   Reviewed: Preterm labor symptoms and general obstetric precautions including but not limited to vaginal bleeding, contractions, leaking of fluid and fetal movement were reviewed in detail with the patient.  All questions were answered.  Follow-up: Return in about 1 week (around 03/30/2018) for  NST.  Orders Placed This Encounter  Procedures  . POCT Urinalysis Dipstick   Jonnie Kind, MD 03/23/2018 10:56 AM  By signing my name below, I, Soijett Blue, attest that this documentation has been prepared under the direction and in the presence of Jonnie Kind, MD. Electronically Signed: Soijett Blue, Presenter, broadcasting. 03/23/18. 10:56 AM.  I personally performed the services described in this documentation, which was SCRIBED in my presence. The recorded information has been reviewed and considered accurate. It has been edited as necessary during review. Jonnie Kind, MD

## 2018-03-23 NOTE — Progress Notes (Addendum)
[place] 35+5 wks,breech,anterior pl gr 1,AFI 56 cm,polyhydramnios,BPP 8/8,FHR 135 bpm,distended stomach and duodenum

## 2018-03-25 DIAGNOSIS — Z029 Encounter for administrative examinations, unspecified: Secondary | ICD-10-CM

## 2018-03-26 ENCOUNTER — Other Ambulatory Visit: Payer: 59 | Admitting: Advanced Practice Midwife

## 2018-03-27 ENCOUNTER — Telehealth: Payer: Self-pay | Admitting: Obstetrics & Gynecology

## 2018-03-27 ENCOUNTER — Encounter (INDEPENDENT_AMBULATORY_CARE_PROVIDER_SITE_OTHER): Payer: Self-pay

## 2018-03-27 NOTE — Telephone Encounter (Signed)
Patient called stating that she had a C-section at [place] and that the baby is on its way to have surgery. Pt just wanted to let the Doctor's know. No call back is needed unless they need to speak with her

## 2018-03-30 ENCOUNTER — Other Ambulatory Visit: Payer: 59

## 2018-03-30 ENCOUNTER — Other Ambulatory Visit: Payer: 59 | Admitting: Obstetrics & Gynecology

## 2018-03-30 ENCOUNTER — Telehealth: Payer: Self-pay | Admitting: *Deleted

## 2018-03-30 ENCOUNTER — Other Ambulatory Visit: Payer: 59 | Admitting: Women's Health

## 2018-03-30 ENCOUNTER — Encounter: Payer: 59 | Admitting: Obstetrics & Gynecology

## 2018-04-02 ENCOUNTER — Telehealth: Payer: Self-pay | Admitting: *Deleted

## 2018-04-06 ENCOUNTER — Other Ambulatory Visit: Payer: 59

## 2018-04-06 ENCOUNTER — Encounter: Payer: 59 | Admitting: Obstetrics & Gynecology

## 2018-04-06 ENCOUNTER — Other Ambulatory Visit: Payer: 59 | Admitting: Obstetrics & Gynecology

## 2018-04-09 NOTE — Telephone Encounter (Signed)
LMOVM that patient was taken out of work on 03/11/18.

## 2018-04-13 ENCOUNTER — Other Ambulatory Visit: Payer: 59 | Admitting: Obstetrics and Gynecology

## 2018-04-13 ENCOUNTER — Encounter: Payer: 59 | Admitting: Obstetrics and Gynecology

## 2018-04-13 ENCOUNTER — Other Ambulatory Visit: Payer: 59

## 2018-06-03 ENCOUNTER — Other Ambulatory Visit: Payer: Self-pay | Admitting: Adult Health

## 2018-07-14 ENCOUNTER — Encounter: Payer: Self-pay | Admitting: *Deleted

## 2019-02-03 NOTE — Telephone Encounter (Signed)
Note sent to nurse. 

## 2019-05-16 DIAGNOSIS — M545 Low back pain: Secondary | ICD-10-CM | POA: Diagnosis not present

## 2019-06-18 DIAGNOSIS — M545 Low back pain: Secondary | ICD-10-CM | POA: Diagnosis not present

## 2019-06-24 DIAGNOSIS — M2560 Stiffness of unspecified joint, not elsewhere classified: Secondary | ICD-10-CM | POA: Diagnosis not present

## 2019-06-24 DIAGNOSIS — M545 Low back pain: Secondary | ICD-10-CM | POA: Diagnosis not present

## 2019-06-24 DIAGNOSIS — M6281 Muscle weakness (generalized): Secondary | ICD-10-CM | POA: Diagnosis not present

## 2019-06-29 DIAGNOSIS — M6281 Muscle weakness (generalized): Secondary | ICD-10-CM | POA: Diagnosis not present

## 2019-06-29 DIAGNOSIS — M545 Low back pain: Secondary | ICD-10-CM | POA: Diagnosis not present

## 2019-06-29 DIAGNOSIS — M2560 Stiffness of unspecified joint, not elsewhere classified: Secondary | ICD-10-CM | POA: Diagnosis not present

## 2019-07-01 DIAGNOSIS — M2560 Stiffness of unspecified joint, not elsewhere classified: Secondary | ICD-10-CM | POA: Diagnosis not present

## 2019-07-01 DIAGNOSIS — M6281 Muscle weakness (generalized): Secondary | ICD-10-CM | POA: Diagnosis not present

## 2019-07-01 DIAGNOSIS — M545 Low back pain: Secondary | ICD-10-CM | POA: Diagnosis not present

## 2019-07-06 DIAGNOSIS — M6281 Muscle weakness (generalized): Secondary | ICD-10-CM | POA: Diagnosis not present

## 2019-07-06 DIAGNOSIS — M545 Low back pain: Secondary | ICD-10-CM | POA: Diagnosis not present

## 2019-07-06 DIAGNOSIS — M2560 Stiffness of unspecified joint, not elsewhere classified: Secondary | ICD-10-CM | POA: Diagnosis not present

## 2019-07-09 DIAGNOSIS — M545 Low back pain: Secondary | ICD-10-CM | POA: Diagnosis not present

## 2019-07-09 DIAGNOSIS — M6281 Muscle weakness (generalized): Secondary | ICD-10-CM | POA: Diagnosis not present

## 2019-07-09 DIAGNOSIS — M2560 Stiffness of unspecified joint, not elsewhere classified: Secondary | ICD-10-CM | POA: Diagnosis not present

## 2019-07-13 DIAGNOSIS — M545 Low back pain: Secondary | ICD-10-CM | POA: Diagnosis not present

## 2019-07-13 DIAGNOSIS — M2560 Stiffness of unspecified joint, not elsewhere classified: Secondary | ICD-10-CM | POA: Diagnosis not present

## 2019-07-13 DIAGNOSIS — M6281 Muscle weakness (generalized): Secondary | ICD-10-CM | POA: Diagnosis not present

## 2019-08-19 DIAGNOSIS — Z3201 Encounter for pregnancy test, result positive: Secondary | ICD-10-CM | POA: Diagnosis not present

## 2019-08-19 DIAGNOSIS — N911 Secondary amenorrhea: Secondary | ICD-10-CM | POA: Diagnosis not present

## 2019-09-07 DIAGNOSIS — O26891 Other specified pregnancy related conditions, first trimester: Secondary | ICD-10-CM | POA: Diagnosis not present

## 2019-09-07 DIAGNOSIS — Z3A08 8 weeks gestation of pregnancy: Secondary | ICD-10-CM | POA: Diagnosis not present

## 2019-09-07 DIAGNOSIS — Z113 Encounter for screening for infections with a predominantly sexual mode of transmission: Secondary | ICD-10-CM | POA: Diagnosis not present

## 2019-09-07 DIAGNOSIS — R69 Illness, unspecified: Secondary | ICD-10-CM | POA: Diagnosis not present

## 2019-09-07 DIAGNOSIS — Z9884 Bariatric surgery status: Secondary | ICD-10-CM | POA: Diagnosis not present

## 2019-09-07 DIAGNOSIS — Z124 Encounter for screening for malignant neoplasm of cervix: Secondary | ICD-10-CM | POA: Diagnosis not present

## 2019-09-07 DIAGNOSIS — Z3689 Encounter for other specified antenatal screening: Secondary | ICD-10-CM | POA: Diagnosis not present

## 2019-09-07 LAB — OB RESULTS CONSOLE ANTIBODY SCREEN: Antibody Screen: NEGATIVE

## 2019-09-07 LAB — OB RESULTS CONSOLE HIV ANTIBODY (ROUTINE TESTING): HIV: NONREACTIVE

## 2019-09-07 LAB — OB RESULTS CONSOLE RPR: RPR: NONREACTIVE

## 2019-09-07 LAB — OB RESULTS CONSOLE GC/CHLAMYDIA
Chlamydia: NEGATIVE
Gonorrhea: NEGATIVE

## 2019-09-07 LAB — OB RESULTS CONSOLE ABO/RH: RH Type: POSITIVE

## 2019-09-07 LAB — OB RESULTS CONSOLE RUBELLA ANTIBODY, IGM: Rubella: IMMUNE

## 2019-09-07 LAB — OB RESULTS CONSOLE HEPATITIS B SURFACE ANTIGEN: Hepatitis B Surface Ag: NEGATIVE

## 2019-10-04 DIAGNOSIS — O09511 Supervision of elderly primigravida, first trimester: Secondary | ICD-10-CM | POA: Diagnosis not present

## 2019-10-04 DIAGNOSIS — Z3A12 12 weeks gestation of pregnancy: Secondary | ICD-10-CM | POA: Diagnosis not present

## 2019-10-04 DIAGNOSIS — R69 Illness, unspecified: Secondary | ICD-10-CM | POA: Diagnosis not present

## 2019-10-04 DIAGNOSIS — Z368A Encounter for antenatal screening for other genetic defects: Secondary | ICD-10-CM | POA: Diagnosis not present

## 2019-10-04 DIAGNOSIS — O09512 Supervision of elderly primigravida, second trimester: Secondary | ICD-10-CM | POA: Diagnosis not present

## 2019-10-11 ENCOUNTER — Ambulatory Visit: Payer: 59 | Admitting: Cardiovascular Disease

## 2019-10-12 ENCOUNTER — Ambulatory Visit (INDEPENDENT_AMBULATORY_CARE_PROVIDER_SITE_OTHER): Payer: Medicaid Other | Admitting: Cardiovascular Disease

## 2019-10-12 ENCOUNTER — Encounter: Payer: Self-pay | Admitting: Cardiovascular Disease

## 2019-10-12 ENCOUNTER — Other Ambulatory Visit: Payer: Self-pay

## 2019-10-12 VITALS — BP 127/71 | HR 73 | Ht 71.0 in | Wt 268.0 lb

## 2019-10-12 DIAGNOSIS — Z720 Tobacco use: Secondary | ICD-10-CM

## 2019-10-12 DIAGNOSIS — Z79899 Other long term (current) drug therapy: Secondary | ICD-10-CM

## 2019-10-12 DIAGNOSIS — R011 Cardiac murmur, unspecified: Secondary | ICD-10-CM

## 2019-10-12 DIAGNOSIS — R002 Palpitations: Secondary | ICD-10-CM | POA: Diagnosis not present

## 2019-10-12 LAB — BASIC METABOLIC PANEL
BUN/Creatinine Ratio: 18 (ref 9–23)
BUN: 8 mg/dL (ref 6–20)
CO2: 20 mmol/L (ref 20–29)
Calcium: 8.5 mg/dL — ABNORMAL LOW (ref 8.7–10.2)
Chloride: 104 mmol/L (ref 96–106)
Creatinine, Ser: 0.45 mg/dL — ABNORMAL LOW (ref 0.57–1.00)
GFR calc Af Amer: 150 mL/min/{1.73_m2} (ref >59)
GFR calc non Af Amer: 130 mL/min/{1.73_m2} (ref >59)
Glucose: 77 mg/dL (ref 65–99)
Potassium: 4.3 mmol/L (ref 3.5–5.2)
Sodium: 136 mmol/L (ref 134–144)

## 2019-10-12 LAB — TSH: TSH: 1.38 u[IU]/mL (ref 0.450–4.500)

## 2019-10-12 NOTE — Progress Notes (Signed)
Cardiology Office Note:   Date:  10/12/2019  NAME:  Lynn Roberts    MRN: [id number] DOB:  23-Dec-1985   PCP:  Patient, No Pcp Per  Cardiologist:  No primary care provider on file.   Referring MD: No ref. provider found   Chief Complaint  Patient presents with  . Heart Murmur   History of Present Illness:   Lynn Roberts is a 33 y.o. female with a hx of pregnancy, PAT status post ablation who is being seen today for the evaluation of cardiac murmur at the request of Eula Flax, MD. She reports that her obstetrician her cardiac murmur and would like her to be evaluated with an echocardiogram.  Regarding history, she has a history of a atrial tachycardia that underwent ablation at [facility] in the 90s.  She reports she had rapid heart rates and had to have an ablation.  I do not have records of this.  She does report since being pregnant she notices 2-3 times per week of a sensation of rapid fluttering in her chest.  She reports can last 10 to 15 minutes and then resolve without intervention.  She states it can occur anytime and there is no definitive trigger.  She reports no chest pain or trouble breathing with this.  She reports she gets a little tired when it occurs but has not passed out or had any presyncopal symptoms.  She denies any chest pain or shortness of breath with exertion.  She does report that she is still smoking and trying to quit.  Her pregnancy is going well despite this.  She has no strong cardiovascular risk factors.  She reports that her father may have heart disease but she is unsure of this.  She denies any alcohol or drug use.  She works as a Education administrator at [employer].  All of her symptoms started 1 month ago.  Past Medical History: Past Medical History:  Diagnosis Date  . Arthritis of low back   . Complication of anesthesia    is of [name] descent  . GERD (gastroesophageal reflux disease)   . History of PAT (paroxysmal atrial tachycardia)    had  cardiac ablation as a teenager  . Migraines   . Miscarriage 04/16/2016  . Obesity   . Papilloma of breast 10/2014   left  . PCO (polycystic ovaries) 07/14/2017  . PCOD (polycystic ovarian disease)    no menses  . Supervision of normal pregnancy 04/09/2016    [facility] Initiated Care at   7+1 week FOB  Charlann Noss Dating By  [place]  Pap  04/09/16 GC/CT Initial:                36+wks: Genetic Screen NT/IT:  CF screen  Anatomic [place]  Flu vaccine  Tdap Recommended ~ 28wks Glucose Screen  2 hr GBS  Feed Preference  Contraception  Circumcision  Childbirth Classes  Pediatrician      Past Surgical History: Past Surgical History:  Procedure Laterality Date  . BREAST LUMPECTOMY Left 2011   papilloma  . BREAST LUMPECTOMY WITH RADIOACTIVE SEED LOCALIZATION Left 10/18/2014   Procedure:  RADIOACTIVE SEED GUIDED EXCISIONAL BREAST BIOPSY;  Surgeon: Alphonsa Overall, MD;  Location: [facility];  Service: General;  Laterality: Left;  . BREAST SURGERY Right 07/27/2004   retro-areolar dissection with exc. of large duct  . CARDIAC ELECTROPHYSIOLOGY STUDY AND ABLATION  age 27  . CHOLECYSTECTOMY  04/27/2008  . GASTROPLASTY DUODENAL SWITCH  01/2014  . OVARIAN CYST REMOVAL Bilateral   . TOENAIL EXCISION    . UNILATERAL SALPINGECTOMY      Current Medications: Current Meds  Medication Sig  . cholecalciferol (VITAMIN D3) 25 MCG (1000 UT) tablet Take 1,000 Units by mouth 2 (two) times daily.  Marland Kitchen docusate sodium (COLACE) 100 MG capsule Take 100 mg by mouth 2 (two) times daily.  . prenatal vitamin w/FE, FA (PRENATAL 1 + 1) 27-1 MG TABS tablet Take 1 tablet by mouth daily at 12 noon.     Allergies:    Nsaids, Other, Sulfa antibiotics, and Tramadol   Social History: Social History   Socioeconomic History  . Marital status: Married    Spouse name: Not on file  . Number of children: 1  . Years of education: Not on file  . Highest education level: Not on file  Occupational History  . Not on file   Social Needs  . Financial resource strain: Not on file  . Food insecurity    Worry: Not on file    Inability: Not on file  . Transportation needs    Medical: Not on file    Non-medical: Not on file  Tobacco Use  . Smoking status: Current Every Day Smoker    Packs/day: 0.50    Years: 10.00    Pack years: 5.00    Types: Cigarettes  . Smokeless tobacco: Never Used  Substance and Sexual Activity  . Alcohol use: No    Comment: occ.  . Drug use: No  . Sexual activity: Yes    Birth control/protection: None  Lifestyle  . Physical activity    Days per week: Not on file    Minutes per session: Not on file  . Stress: Not on file  Relationships  . Social Herbalist on phone: Not on file    Gets together: Not on file    Attends religious service: Not on file    Active member of club or organization: Not on file    Attends meetings of clubs or organizations: Not on file    Relationship status: Not on file  Other Topics Concern  . Not on file  Social History Narrative   Orthoptist.      Family History: The patient's family history includes Cancer in her maternal grandmother and paternal grandfather; Diabetes in her maternal grandfather and mother; Heart disease in her maternal grandmother; Hyperlipidemia in her father; Hypertension in her father and mother.  ROS:   All other ROS reviewed and negative. Pertinent positives noted in the HPI.     EKGs/Labs/Other Studies Reviewed:   The following studies were personally reviewed by me today:  EKG:  EKG is ordered today.  The ekg ordered today demonstrates normal sinus rhythm, heart rate 73, normal intervals, no acute ST-T changes, no evidence of prior infarction, normal EKG, and was personally reviewed by me.   Recent Labs: No results found for requested labs within last 8760 hours.   Recent Lipid Panel No results found for: CHOL, TRIG, HDL, CHOLHDL, VLDL, LDLCALC, LDLDIRECT  Physical Exam:   VS:  BP  127/71   Pulse 73   Ht 5\' 11"  (1.803 m)   Wt 268 lb (121.6 kg)   SpO2 100%   BMI 37.38 kg/m    Wt Readings from Last 3 Encounters:  10/12/19 268 lb (121.6 kg)  03/23/18 278 lb (126.1 kg)  03/16/18 291 lb (132 kg)    General: Well nourished,  well developed, in no acute distress Heart: Atraumatic, normal size  Eyes: PEERLA, EOMI  Neck: Supple, no JVD Endocrine: No thryomegaly Cardiac: Faint 2 out of 6 systolic ejection murmur Lungs: Clear to auscultation bilaterally, no wheezing, rhonchi or rales  Abd: Soft, nontender, no hepatomegaly  Ext: No edema, pulses 2+ Musculoskeletal: No deformities, BUE and BLE strength normal and equal Skin: Warm and dry, no rashes   Neuro: Alert and oriented to person, place, time, and situation, CNII-XII grossly intact, no focal deficits  Psych: Normal mood and affect   ASSESSMENT:   Lynn Roberts is a 33 y.o. female who presents for the following: 1. Murmur, cardiac   2. Palpitations   3. Tobacco abuse   4. Medication management     PLAN:    1. Murmur, cardiac -Faint murmur that is likely expected in the setting of pregnancy.  We will get an echocardiogram.  Her EKG is normal.  I do suspect a very normal ultrasound.  2. Palpitations -She has a history of atrial tachycardia status post ablation.  No thyroid studies sent over.  We will check that today.  We will also check kidney function.  It would be in usual for her to have a recurrence of this as the success rate is about 95%.  She may be having other arrhythmias in the setting of pregnancy.  We will obtain a 7-day ZIO patch and then decide on any therapy she may need.  I did counsel her that arrhythmias are not uncommon in pregnancy and the often resolve once pregnancy is done with.  I have encouraged her to remain hydrated and continue to maintain a certain level of activity.  She also needs to quit smoking as this will drastically reduce her risk for cardiovascular disease now and in the  future.  3. Tobacco abuse -Counseled on smoking cessation today   Disposition: Return in about 2 months (around 12/12/2019).  Medication Adjustments/Labs and Tests Ordered: Current medicines are reviewed at length with the patient today.  Concerns regarding medicines are outlined above.  Orders Placed This Encounter  Procedures  . TSH  . BMET  . zio LIVE monitor  . EKG 12-Lead  . ECHOCARDIOGRAM COMPLETE   No orders of the defined types were placed in this encounter.   Patient Instructions  Medication Instructions:  The current medical regimen is effective;  continue present plan and medications as directed. Please refer to the Current Medication list given to you today. If you need a refill on your cardiac medications before your next appointment, please call your pharmacy.  Testing/Procedures: Echocardiogram - Your physician has requested that you have an echocardiogram. Echocardiography is a painless test that uses sound waves to create images of your heart. It provides your doctor with information about the size and shape of your heart and how well your heart's chambers and valves are working. This procedure takes approximately one hour. There are no restrictions for this procedure. This will be performed at our [facility] location - [address].  Your physician has recommended that you wear a 7 DAY DAY ZIO-PATCH monitor. The Zio patch cardiac monitor continuously records heart rhythm data for up to 14 days, this is for patients being evaluated for multiple types heart rhythms. For the first 24 hours post application, please avoid getting the Zio monitor wet in the shower or by excessive sweating during exercise. After that, feel free to carry on with regular activities. Keep soaps and  lotions away from the ZIO XT Patch.  This will be mailed to you, please expect 7-10 days to receive.    Follow-Up: IN 2 MONTHS  Virtual Visit  You may see [place] O'NEAL or one of the  following Advanced Practice Providers on your designated Care Team:  Almyra Deforest, PA-C  Fabian Sharp, PA-C or [employer], [place].    At [facility], you and your health needs are our priority.  As part of our continuing mission to provide you with exceptional heart care, we have created designated Provider Care Teams.  These Care Teams include your primary Cardiologist (physician) and Advanced Practice Providers (APPs -  Physician Assistants and Nurse Practitioners) who all work together to provide you with the care you need, when you need it.  Thank you for choosing [facility] at [employer], [address], [place]  [address], [place], [place] [address] [phone]  10/12/2019 10:00 AM

## 2019-10-12 NOTE — Patient Instructions (Signed)
Medication Instructions:  The current medical regimen is effective;  continue present plan and medications as directed. Please refer to the Current Medication list given to you today. If you need a refill on your cardiac medications before your next appointment, please call your pharmacy.  Testing/Procedures: Echocardiogram - Your physician has requested that you have an echocardiogram. Echocardiography is a painless test that uses sound waves to create images of your heart. It provides your doctor with information about the size and shape of your heart and how well your heart's chambers and valves are working. This procedure takes approximately one hour. There are no restrictions for this procedure. This will be performed at our [facility] location - [address].  Your physician has recommended that you wear a 7 DAY DAY ZIO-PATCH monitor. The Zio patch cardiac monitor continuously records heart rhythm data for up to 14 days, this is for patients being evaluated for multiple types heart rhythms. For the first 24 hours post application, please avoid getting the Zio monitor wet in the shower or by excessive sweating during exercise. After that, feel free to carry on with regular activities. Keep soaps and lotions away from the ZIO XT Patch.  This will be mailed to you, please expect 7-10 days to receive.    Follow-Up: IN 2 MONTHS  Virtual Visit  You may see Belmar O'NEAL or one of the following Advanced Practice Providers on your designated Care Team:  Almyra Deforest, PA-C  Fabian Sharp, PA-C or [employer], [place].    At [facility], you and your health needs are our priority.  As part of our continuing mission to provide you with exceptional heart care, we have created designated Provider Care Teams.  These Care Teams include your primary Cardiologist (physician) and Advanced Practice Providers (APPs -  Physician Assistants and Nurse Practitioners) who all work together to provide you with  the care you need, when you need it.  Thank you for choosing [facility]!!

## 2019-10-20 ENCOUNTER — Telehealth: Payer: Self-pay | Admitting: *Deleted

## 2019-10-20 NOTE — Telephone Encounter (Signed)
Returning call regarding heart monitor. [employer] should be calling you to confirm a shipping address before sending your monitor out.  They can be reached at [phone].

## 2019-10-21 ENCOUNTER — Ambulatory Visit (HOSPITAL_COMMUNITY): Payer: 59 | Attending: Cardiovascular Disease

## 2019-10-21 ENCOUNTER — Other Ambulatory Visit: Payer: Self-pay

## 2019-10-21 DIAGNOSIS — R011 Cardiac murmur, unspecified: Secondary | ICD-10-CM | POA: Insufficient documentation

## 2019-10-29 ENCOUNTER — Encounter (INDEPENDENT_AMBULATORY_CARE_PROVIDER_SITE_OTHER): Payer: Medicaid Other

## 2019-10-29 DIAGNOSIS — R002 Palpitations: Secondary | ICD-10-CM

## 2019-11-16 ENCOUNTER — Other Ambulatory Visit: Payer: Self-pay | Admitting: Cardiovascular Disease

## 2019-11-16 DIAGNOSIS — R002 Palpitations: Secondary | ICD-10-CM

## 2019-11-19 ENCOUNTER — Telehealth: Payer: Self-pay | Admitting: Cardiovascular Disease

## 2019-11-19 NOTE — Telephone Encounter (Signed)
Spoke to patient Dr.O'Neal advised to cancel 12/23/19 appointment.Advised to call in March to schedule a June or July appointment.Advised to call sooner if needed.

## 2019-11-19 NOTE — Telephone Encounter (Signed)
Called Mrs. North. Monitor with rare PVCs that likely explain her symptoms. Less frequently per her report. We will keep an eye on and not start medication. I suspect this will go away after her delivery in May. We will re-schedule her in June or July after her delivery. She will call sooner if there are issues.   [place] T. Audie Box, [place]  [address] [place], [place] [address] [phone]  9:12 AM

## 2019-11-23 DIAGNOSIS — Z363 Encounter for antenatal screening for malformations: Secondary | ICD-10-CM | POA: Diagnosis not present

## 2019-11-23 DIAGNOSIS — O09512 Supervision of elderly primigravida, second trimester: Secondary | ICD-10-CM | POA: Diagnosis not present

## 2019-11-23 DIAGNOSIS — O99212 Obesity complicating pregnancy, second trimester: Secondary | ICD-10-CM | POA: Diagnosis not present

## 2019-11-23 DIAGNOSIS — Z3A19 19 weeks gestation of pregnancy: Secondary | ICD-10-CM | POA: Diagnosis not present

## 2019-12-14 ENCOUNTER — Ambulatory Visit: Payer: Medicaid Other | Admitting: Cardiovascular Disease

## 2019-12-15 DIAGNOSIS — R309 Painful micturition, unspecified: Secondary | ICD-10-CM | POA: Diagnosis not present

## 2019-12-16 DIAGNOSIS — O99412 Diseases of the circulatory system complicating pregnancy, second trimester: Secondary | ICD-10-CM | POA: Diagnosis not present

## 2019-12-16 DIAGNOSIS — I499 Cardiac arrhythmia, unspecified: Secondary | ICD-10-CM | POA: Diagnosis not present

## 2019-12-27 DIAGNOSIS — R8290 Unspecified abnormal findings in urine: Secondary | ICD-10-CM | POA: Diagnosis not present

## 2020-01-25 ENCOUNTER — Other Ambulatory Visit: Payer: Self-pay

## 2020-01-25 ENCOUNTER — Ambulatory Visit (INDEPENDENT_AMBULATORY_CARE_PROVIDER_SITE_OTHER): Payer: 59 | Admitting: Physician Assistant

## 2020-01-25 ENCOUNTER — Encounter: Payer: Self-pay | Admitting: Physician Assistant

## 2020-01-25 VITALS — BP 108/68 | HR 72 | Temp 97.3°F | Ht 71.0 in | Wt 285.0 lb

## 2020-01-25 DIAGNOSIS — R0789 Other chest pain: Secondary | ICD-10-CM | POA: Diagnosis not present

## 2020-01-25 DIAGNOSIS — I471 Supraventricular tachycardia: Secondary | ICD-10-CM

## 2020-01-25 DIAGNOSIS — Z3A28 28 weeks gestation of pregnancy: Secondary | ICD-10-CM

## 2020-01-25 DIAGNOSIS — Z3689 Encounter for other specified antenatal screening: Secondary | ICD-10-CM | POA: Diagnosis not present

## 2020-01-25 NOTE — Patient Instructions (Signed)
Medication Instructions:  Your physician recommends that you continue on your current medications as directed. Please refer to the Current Medication list given to you today.  *If you need a refill on your cardiac medications before your next appointment, please call your pharmacy*  Lab Work: NONE ordered at this time of appointment   If you have labs (blood work) drawn today and your tests are completely normal, you will receive your results only by: Marland Kitchen MyChart Message (if you have MyChart) OR . A paper copy in the mail If you have any lab test that is abnormal or we need to change your treatment, we will call you to review the results.  Testing/Procedures: NONE ordered at this time of appointment   Follow-Up: At [facility], you and your health needs are our priority.  As part of our continuing mission to provide you with exceptional heart care, we have created designated Provider Care Teams.  These Care Teams include your primary Cardiologist (physician) and Advanced Practice Providers (APPs -  Physician Assistants and Nurse Practitioners) who all work together to provide you with the care you need, when you need it.  Your next appointment:   3-4 week(s)  The format for your next appointment:   In Person  Provider:   You may see Evalina Field, MD or one of the following Advanced Practice Providers on your designated Care Team:    Almyra Deforest, PA-C  Fabian Sharp, [place] or   Roby Lofts, [place]   Other Instructions  If your symptoms continue or worsen please give our office a call or go to the nearest Emergency Department

## 2020-01-25 NOTE — Progress Notes (Signed)
Cardiology Office Note:    Date:  01/27/2020   ID:  Lynn Roberts, DOB 06-25-1986, MRN [id number]  PCP:  Patient, No Pcp Per  Cardiologist:  Evalina Field, MD  Electrophysiologist:  None   Referring MD: No ref. provider found   Chief Complaint  Patient presents with  . Follow-up    seen for Dr. Audie Box    History of Present Illness:    Lynn Roberts is a 34 y.o. female with a hx of past medical history of PAT s/p ablation as a teenager at [facility] in the 90s, polycystic ovarian disease, and history of miscarriage.  Patient was previously evaluated by Dr. Audie Box on 10/12/2019 for cardiac murmur.  She mentioned that since pregnancy, she has had occasional palpitation.  Echocardiogram obtained on 10/21/2019 showed EF 60 to 65%.  7 day Zio monitor obtained on 11/15/2019 showed rare PVCs associated with patient triggered events, no sustained arrhythmia, no atrial fibrillation.  According to the patient, she has been having intermittent substernal chest discomfort for the past 2 months.  She was instructed to start on baby aspirin around Christmas time and since then, she has been having chest discomfort.  The chest discomfort is not worse with physical activity nor it is affected by deep inspiration, body rotation or palpation.  She is having this chest pressure on daily basis.  Last episode was this morning when she was trying to get into the shower.  She does have increasing dyspnea during exertion related to her pregnancy.  Otherwise no dizzy spell or feeling of passing out.  She does occasionally feel some palpitation as well, however palpitation in her chest pain occurs at 2 different times.  Sitting up versus laying down does not affect the degree of chest pain.  However sometimes when she is laying on the left side during sleep, she noticed the chest pain less.  Her EKG today showed normal sinus rhythm without significant ST-T wave changes.  Given her previous reassuring  echocardiogram and her current pregnancy status, I did not recommend any further work-up unless her symptom worsens.  I plan to follow-up with her closely and see her back in 3 to 4 weeks.    Past Medical History:  Diagnosis Date  . Arthritis of low back   . Complication of anesthesia    is of [name] descent  . GERD (gastroesophageal reflux disease)   . History of PAT (paroxysmal atrial tachycardia)    had cardiac ablation as a teenager  . Migraines   . Miscarriage 04/16/2016  . Obesity   . Papilloma of breast 10/2014   left  . PCO (polycystic ovaries) 07/14/2017  . PCOD (polycystic ovarian disease)    no menses  . Supervision of normal pregnancy 04/09/2016    [facility] Initiated Care at   7+1 week FOB  Charlann Noss Dating By  [place]  Pap  04/09/16 GC/CT Initial:                36+wks: Genetic Screen NT/IT:  CF screen  Anatomic [place]  Flu vaccine  Tdap Recommended ~ 28wks Glucose Screen  2 hr GBS  Feed Preference  Contraception  Circumcision  Childbirth Classes  Pediatrician      Past Surgical History:  Procedure Laterality Date  . BREAST LUMPECTOMY Left 2011   papilloma  . BREAST LUMPECTOMY WITH RADIOACTIVE SEED LOCALIZATION Left 10/18/2014   Procedure:  RADIOACTIVE SEED GUIDED EXCISIONAL BREAST BIOPSY;  Surgeon: Alphonsa Overall, MD;  Location: [place];  Service: General;  Laterality: Left;  . BREAST SURGERY Right 07/27/2004   retro-areolar dissection with exc. of large duct  . CARDIAC ELECTROPHYSIOLOGY STUDY AND ABLATION  age 79  . CHOLECYSTECTOMY  04/27/2008  . GASTROPLASTY DUODENAL SWITCH  01/2014  . OVARIAN CYST REMOVAL Bilateral   . TOENAIL EXCISION    . UNILATERAL SALPINGECTOMY      Current Medications: Current Meds  Medication Sig  . aspirin 81 MG EC tablet Take 81 mg by mouth daily. Swallow whole.  . cholecalciferol (VITAMIN D3) 25 MCG (1000 UT) tablet Take 1,000 Units by mouth 2 (two) times daily.  Marland Kitchen docusate sodium (COLACE) 100 MG capsule Take 100 mg  by mouth 2 (two) times daily.  . prenatal vitamin w/FE, FA (PRENATAL 1 + 1) 27-1 MG TABS tablet Take 1 tablet by mouth daily at 12 noon.     Allergies:   Nsaids, Other, Sulfa antibiotics, and Tramadol   Social History   Socioeconomic History  . Marital status: Married    Spouse name: Not on file  . Number of children: 1  . Years of education: Not on file  . Highest education level: Not on file  Occupational History  . Not on file  Tobacco Use  . Smoking status: Current Every Day Smoker    Packs/day: 0.50    Years: 10.00    Pack years: 5.00    Types: Cigarettes  . Smokeless tobacco: Never Used  Substance and Sexual Activity  . Alcohol use: No    Comment: occ.  . Drug use: No  . Sexual activity: Yes    Birth control/protection: None  Other Topics Concern  . Not on file  Social History Narrative   Orthoptist.    Social Determinants of Health   Financial Resource Strain:   . Difficulty of Paying Living Expenses: Not on file  Food Insecurity:   . Worried About Charity fundraiser in the Last Year: Not on file  . Ran Out of Food in the Last Year: Not on file  Transportation Needs:   . Lack of Transportation (Medical): Not on file  . Lack of Transportation (Non-Medical): Not on file  Physical Activity:   . Days of Exercise per Week: Not on file  . Minutes of Exercise per Session: Not on file  Stress:   . Feeling of Stress : Not on file  Social Connections:   . Frequency of Communication with Friends and Family: Not on file  . Frequency of Social Gatherings with Friends and Family: Not on file  . Attends Religious Services: Not on file  . Active Member of Clubs or Organizations: Not on file  . Attends Archivist Meetings: Not on file  . Marital Status: Not on file     Family History: The patient's family history includes Cancer in her maternal grandmother and paternal grandfather; Diabetes in her maternal grandfather and mother; Heart disease  in her maternal grandmother; Hyperlipidemia in her father; Hypertension in her father and mother.  ROS:   Please see the history of present illness.     All other systems reviewed and are negative.  EKGs/Labs/Other Studies Reviewed:    The following studies were reviewed today:  Echo 10/21/2019 1. Left ventricular ejection fraction, by visual estimation, is 60 to  65%. The left ventricle has normal function. There is no left ventricular  hypertrophy.  2. Mildly dilated left ventricular internal cavity size.  3. Global  right ventricle has normal systolic function.The right  ventricular size is normal.  4. Left atrial size was normal.  5. Right atrial size was normal.  6. The mitral valve is normal in structure. No evidence of mitral valve  regurgitation. No evidence of mitral stenosis.  7. The tricuspid valve is normal in structure. Tricuspid valve  regurgitation is trivial.  8. The aortic valve is normal in structure. Aortic valve regurgitation is  not visualized. No evidence of aortic valve sclerosis or stenosis.  9. The pulmonic valve was normal in structure. Pulmonic valve  regurgitation is not visualized.  10. The inferior vena cava is normal in size with greater than 50%  respiratory variability, suggesting right atrial pressure of 3 mmHg.  11. Normal LV systolic and diastolic function.     7 day Zio 11/16/2019 The patient wore a 7-day zio patch (10/29/2019-11/05/2019). Patient had a min HR of 58 bpm (sinus bradycardia), max HR of 119 bpm (sinus tachycardia), and avg HR of 82 bpm (normal sinus rhythm). Predominant underlying rhythm was Sinus Rhythm. Isolated SVEs were rare (<1.0%), SVE Triplets were rare (<1.0%), and no SVE Couplets were present. Isolated VEs were rare (<1.0%), VE Couplets were rare (<1.0%), and no VE Triplets were present. Ventricular Bigeminy was present. No atrial fibrillation. There were a total of 31 patient triggered events but not diary submitted.    Patient triggered events associated with normal sinus rhythm and rare PVCs.   Impression:  1. Rare PVCs associated with patient triggered events.  2. No sustained arrhythmias.  3. No atrial fibrillation.   EKG:  EKG is ordered today.  The ekg ordered today demonstrates normal sinus rhythm without significant ST-T wave changes  Recent Labs: 10/12/2019: BUN 8; Creatinine, Ser 0.45; Potassium 4.3; Sodium 136; TSH 1.380  Recent Lipid Panel No results found for: CHOL, TRIG, HDL, CHOLHDL, VLDL, LDLCALC, LDLDIRECT  Physical Exam:    VS:  BP 108/68   Pulse 72   Temp (!) 97.3 F (36.3 C)   Ht 5\' 11"  (1.803 m)   Wt 285 lb (129.3 kg)   SpO2 99%   BMI 39.75 kg/m     Wt Readings from Last 3 Encounters:  01/25/20 285 lb (129.3 kg)  10/12/19 268 lb (121.6 kg)  03/23/18 278 lb (126.1 kg)     GEN:  Well nourished, well developed in no acute distress HEENT: Normal NECK: No JVD; No carotid bruits LYMPHATICS: No lymphadenopathy CARDIAC: RRR, no murmurs, rubs, gallops RESPIRATORY:  Clear to auscultation without rales, wheezing or rhonchi  ABDOMEN: Soft, non-tender, non-distended MUSCULOSKELETAL:  No edema; No deformity  SKIN: Warm and dry NEUROLOGIC:  Alert and oriented x 3 PSYCHIATRIC:  Normal affect   ASSESSMENT:    1. Atypical chest pain   2. [redacted] weeks gestation of pregnancy   3. PAT (paroxysmal atrial tachycardia) (HCC)    PLAN:    In order of problems listed above:  1. Atypical chest pain: Symptom does not correlate with degree of physical activity.  EKG shows no significant ST-T wave changes.  Previous echocardiogram showed normal EF, no wall motion abnormality.  She has fairly low cardiac risk given her young age.  Given her current pregnancy status, I do not feel strongly about stress test, CT or cardiac catheterization.  I recommended continue observation and I will see her back in 3 to 4 weeks.  2. 28 weeks pregnancy: This is her third pregnancy, her first  pregnancy resulted in miscarriage.  She also has  a 61-year-old child.  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  Current pregnancy is without complication.   Medication Adjustments/Labs and Tests Ordered: Current medicines are reviewed at length with the patient today.  Concerns regarding medicines are outlined above.  Orders Placed This Encounter  Procedures  . EKG 12-Lead   No orders of the defined types were placed in this encounter.   Patient Instructions  Medication Instructions:  Your physician recommends that you continue on your current medications as directed. Please refer to the Current Medication list given to you today.  *If you need a refill on your cardiac medications before your next appointment, please call your pharmacy*  Lab Work: NONE ordered at this time of appointment   If you have labs (blood work) drawn today and your tests are completely normal, you will receive your results only by: Marland Kitchen MyChart Message (if you have MyChart) OR . A paper copy in the mail If you have any lab test that is abnormal or we need to change your treatment, we will call you to review the results.  Testing/Procedures: NONE ordered at this time of appointment   Follow-Up: At [facility], you and your health needs are our priority.  As part of our continuing mission to provide you with exceptional heart care, we have created designated Provider Care Teams.  These Care Teams include your primary Cardiologist (physician) and Advanced Practice Providers (APPs -  Physician Assistants and Nurse Practitioners) who all work together to provide you with the care you need, when you need it.  Your next appointment:   3-4 week(s)  The format for your next appointment:   In Person  Provider:   You may see Evalina Field, MD or one of the following Advanced Practice Providers on your designated Care Team:    Almyra Deforest, PA-C  Fabian Sharp, [place] or   Roby Lofts, [place]   Other Instructions  If your symptoms continue or worsen please give our office a call or go to the nearest Emergency Department    Signed, Almyra Deforest, [place]  01/27/2020 11:56 PM    [place]

## 2020-02-07 DIAGNOSIS — Z3A30 30 weeks gestation of pregnancy: Secondary | ICD-10-CM | POA: Diagnosis not present

## 2020-02-07 DIAGNOSIS — O99013 Anemia complicating pregnancy, third trimester: Secondary | ICD-10-CM | POA: Diagnosis not present

## 2020-02-14 ENCOUNTER — Telehealth: Payer: Self-pay | Admitting: Hematology and Oncology

## 2020-02-14 NOTE — Telephone Encounter (Signed)
Scheduled per referral. Pt returned called and schedule 4/2 appt. Pt aware to arrive 15-30 mins early and to bring insurance card and photo ID.

## 2020-02-17 ENCOUNTER — Ambulatory Visit (INDEPENDENT_AMBULATORY_CARE_PROVIDER_SITE_OTHER): Payer: 59 | Admitting: Physician Assistant

## 2020-02-17 ENCOUNTER — Encounter: Payer: Self-pay | Admitting: Physician Assistant

## 2020-02-17 ENCOUNTER — Other Ambulatory Visit: Payer: Self-pay

## 2020-02-17 VITALS — BP 128/80 | HR 85 | Temp 97.9°F | Ht 71.0 in | Wt 268.0 lb

## 2020-02-17 DIAGNOSIS — R0789 Other chest pain: Secondary | ICD-10-CM

## 2020-02-17 DIAGNOSIS — I471 Supraventricular tachycardia: Secondary | ICD-10-CM

## 2020-02-17 NOTE — Patient Instructions (Signed)
Medication Instructions:  Your physician recommends that you continue on your current medications as directed. Please refer to the Current Medication list given to you today.  *If you need a refill on your cardiac medications before your next appointment, please call your pharmacy*  Lab Work: NONE ordered at this time of appointment   If you have labs (blood work) drawn today and your tests are completely normal, you will receive your results only by: Marland Kitchen MyChart Message (if you have MyChart) OR . A paper copy in the mail If you have any lab test that is abnormal or we need to change your treatment, we will call you to review the results.  Testing/Procedures: NONE ordered at this time of appointment   Follow-Up: At [facility], you and your health needs are our priority.  As part of our continuing mission to provide you with exceptional heart care, we have created designated Provider Care Teams.  These Care Teams include your primary Cardiologist (physician) and Advanced Practice Providers (APPs -  Physician Assistants and Nurse Practitioners) who all work together to provide you with the care you need, when you need it.  We recommend signing up for the patient portal called "MyChart".  Sign up information is provided on this After Visit Summary.  MyChart is used to connect with patients for Virtual Visits (Telemedicine).  Patients are able to view lab/test results, encounter notes, upcoming appointments, etc.  Non-urgent messages can be sent to your provider as well.   To learn more about what you can do with MyChart, go to [email or web address].    Your next appointment:   3-4 month(s)  The format for your next appointment:   In Person  Provider:   Eleonore Chiquito, MD  Other Instructions

## 2020-02-17 NOTE — Progress Notes (Signed)
Cardiology Office Note:    Date:  02/19/2020   ID:  Lynn Roberts, DOB Jul 19, 1986, MRN [id number]  PCP:  Patient, No Pcp Per  Cardiologist:  Evalina Field, MD  Electrophysiologist:  None   Referring MD: No ref. provider found   Chief Complaint  Patient presents with  . Follow-up    seen for Dr. Audie Box    History of Present Illness:    Lynn Roberts is a 34 y.o. female with a hx of past medical history of PAT s/p ablation as a teenager at [facility] in the 90s, polycystic ovarian disease, and history of miscarriage.  Patient was previously evaluated by Dr. Audie Box on 10/12/2019 for cardiac murmur.  She mentioned that since pregnancy, she has had occasional palpitation.  Echocardiogram obtained on 10/21/2019 showed EF 60 to 65%.  7 day Zio monitor obtained on 11/15/2019 showed rare PVCs associated with patient triggered events, no sustained arrhythmia, no atrial fibrillation.  I last saw the patient in February 2021, she was having intermittent chest discomfort for 2 months.  She was instructed to start on baby aspirin around Christmas and since then she has been noticing the chest discomfort.  The symptom does not worsen with physical activity and is not affected by deep inspiration, body rotation or palpation.  I did not recommend any further work-up given her reassuring echocardiogram and her pregnancy status.   Patient presents today for cardiology office visit.  Her chest pain still is intermittent however only occurs about twice a week.  It lasts somewhere between 5 to 10 minutes and resolved by itself.  The symptom does not occur with physical exertion.  I recommended continue on the current therapy.  She can follow-up with Dr. Audie Box after her delivery.  She is currently scheduled for delivery via C-section you may.  She can follow-up with Dr. Audie Box in June or July.    Past Medical History:  Diagnosis Date  . Arthritis of low back   . Complication of anesthesia      is of [name] descent  . GERD (gastroesophageal reflux disease)   . History of PAT (paroxysmal atrial tachycardia)    had cardiac ablation as a teenager  . Migraines   . Miscarriage 04/16/2016  . Obesity   . Papilloma of breast 10/2014   left  . PCO (polycystic ovaries) 07/14/2017  . PCOD (polycystic ovarian disease)    no menses  . Supervision of normal pregnancy 04/09/2016    [facility] at   7+1 week FOB  Lynn Roberts Dating By  [place]  Pap  04/09/16 GC/CT Initial:                36+wks: Genetic Screen NT/IT:  CF screen  Anatomic [place]  Flu vaccine  Tdap Recommended ~ 28wks Glucose Screen  2 hr GBS  Feed Preference  Contraception  Circumcision  Childbirth Classes  Pediatrician      Past Surgical History:  Procedure Laterality Date  . BREAST LUMPECTOMY Left 2011   papilloma  . BREAST LUMPECTOMY WITH RADIOACTIVE SEED LOCALIZATION Left 10/18/2014   Procedure:  RADIOACTIVE SEED GUIDED EXCISIONAL BREAST BIOPSY;  Surgeon: Alphonsa Overall, MD;  Location: [place];  Service: General;  Laterality: Left;  . BREAST SURGERY Right 07/27/2004   retro-areolar dissection with exc. of large duct  . CARDIAC ELECTROPHYSIOLOGY STUDY AND ABLATION  age 41  . CHOLECYSTECTOMY  04/27/2008  . GASTROPLASTY DUODENAL SWITCH  01/2014  .  OVARIAN CYST REMOVAL Bilateral   . TOENAIL EXCISION    . UNILATERAL SALPINGECTOMY      Current Medications: Current Meds  Medication Sig  . aspirin 81 MG EC tablet Take 81 mg by mouth daily. Swallow whole.  . cholecalciferol (VITAMIN D3) 25 MCG (1000 UT) tablet Take 1,000 Units by mouth 2 (two) times daily.  Marland Kitchen docusate sodium (COLACE) 100 MG capsule Take 100 mg by mouth 2 (two) times daily.  Marland Kitchen FERREX 150 150 MG capsule Take 150 mg by mouth daily.  . Prenat-FeCbn-FeAsp-Meth-FA-DHA (PRENATE MINI) 18-0.6-0.4-350 MG CAPS Take 1 capsule by mouth daily.  . prenatal vitamin w/FE, FA (PRENATAL 1 + 1) 27-1 MG TABS tablet Take 1 tablet by mouth daily at 12  noon.     Allergies:   Nsaids, Other, Sulfa antibiotics, and Tramadol   Social History   Socioeconomic History  . Marital status: Married    Spouse name: Not on file  . Number of children: 1  . Years of education: Not on file  . Highest education level: Not on file  Occupational History  . Not on file  Tobacco Use  . Smoking status: Current Every Day Smoker    Packs/day: 0.50    Years: 10.00    Pack years: 5.00    Types: Cigarettes  . Smokeless tobacco: Never Used  Substance and Sexual Activity  . Alcohol use: No    Comment: occ.  . Drug use: No  . Sexual activity: Yes    Birth control/protection: None  Other Topics Concern  . Not on file  Social History Narrative   Orthoptist.    Social Determinants of Health   Financial Resource Strain:   . Difficulty of Paying Living Expenses:   Food Insecurity:   . Worried About Charity fundraiser in the Last Year:   . Arboriculturist in the Last Year:   Transportation Needs:   . Film/video editor (Medical):   Marland Kitchen Lack of Transportation (Non-Medical):   Physical Activity:   . Days of Exercise per Week:   . Minutes of Exercise per Session:   Stress:   . Feeling of Stress :   Social Connections:   . Frequency of Communication with Friends and Family:   . Frequency of Social Gatherings with Friends and Family:   . Attends Religious Services:   . Active Member of Clubs or Organizations:   . Attends Archivist Meetings:   Marland Kitchen Marital Status:      Family History: The patient's family history includes Cancer in her maternal grandmother and paternal grandfather; Diabetes in her maternal grandfather and mother; Heart disease in her maternal grandmother; Hyperlipidemia in her father; Hypertension in her father and mother.  ROS:   Please see the history of present illness.     All other systems reviewed and are negative.  EKGs/Labs/Other Studies Reviewed:    The following studies were reviewed  today:  Echo 10/21/2019 1. Left ventricular ejection fraction, by visual estimation, is 60 to  65%. The left ventricle has normal function. There is no left ventricular  hypertrophy.  2. Mildly dilated left ventricular internal cavity size.  3. Global right ventricle has normal systolic function.The right  ventricular size is normal.  4. Left atrial size was normal.  5. Right atrial size was normal.  6. The mitral valve is normal in structure. No evidence of mitral valve  regurgitation. No evidence of mitral stenosis.  7. The tricuspid  valve is normal in structure. Tricuspid valve  regurgitation is trivial.  8. The aortic valve is normal in structure. Aortic valve regurgitation is  not visualized. No evidence of aortic valve sclerosis or stenosis.  9. The pulmonic valve was normal in structure. Pulmonic valve  regurgitation is not visualized.  10. The inferior vena cava is normal in size with greater than 50%  respiratory variability, suggesting right atrial pressure of 3 mmHg.  11. Normal LV systolic and diastolic function.   EKG:  EKG is not ordered today.    Recent Labs: 10/12/2019: BUN 8; Creatinine, Ser 0.45; Potassium 4.3; Sodium 136; TSH 1.380  Recent Lipid Panel No results found for: CHOL, TRIG, HDL, CHOLHDL, VLDL, LDLCALC, LDLDIRECT  Physical Exam:    VS:  BP 128/80   Pulse 85   Temp 97.9 F (36.6 C)   Ht 5\' 11"  (1.803 m)   Wt 268 lb (121.6 kg)   SpO2 99%   BMI 37.38 kg/m     Wt Readings from Last 3 Encounters:  02/17/20 268 lb (121.6 kg)  01/25/20 285 lb (129.3 kg)  10/12/19 268 lb (121.6 kg)     GEN:  Well nourished, well developed in no acute distress HEENT: Normal NECK: No JVD; No carotid bruits LYMPHATICS: No lymphadenopathy CARDIAC: RRR, no murmurs, rubs, gallops RESPIRATORY:  Clear to auscultation without rales, wheezing or rhonchi  ABDOMEN: Soft, non-tender, non-distended MUSCULOSKELETAL:  No edema; No deformity  SKIN: Warm and  dry NEUROLOGIC:  Alert and oriented x 3 PSYCHIATRIC:  Normal affect   ASSESSMENT:    1. Atypical chest pain   2. PAT (paroxysmal atrial tachycardia) (HCC)    PLAN:    In order of problems listed above:  1. Atypical chest pain: Symptom has been improving.  Given her pregnancy status and atypical nature of the symptom, I did not recommend any further work-up  2. History of PAT: S/p ablation at [facility].  No recent recurrence.   Medication Adjustments/Labs and Tests Ordered: Current medicines are reviewed at length with the patient today.  Concerns regarding medicines are outlined above.  No orders of the defined types were placed in this encounter.  No orders of the defined types were placed in this encounter.   Patient Instructions  Medication Instructions:  Your physician recommends that you continue on your current medications as directed. Please refer to the Current Medication list given to you today.  *If you need a refill on your cardiac medications before your next appointment, please call your pharmacy*  Lab Work: NONE ordered at this time of appointment   If you have labs (blood work) drawn today and your tests are completely normal, you will receive your results only by: Marland Kitchen MyChart Message (if you have MyChart) OR . A paper copy in the mail If you have any lab test that is abnormal or we need to change your treatment, we will call you to review the results.  Testing/Procedures: NONE ordered at this time of appointment   Follow-Up: At [facility], you and your health needs are our priority.  As part of our continuing mission to provide you with exceptional heart care, we have created designated Provider Care Teams.  These Care Teams include your primary Cardiologist (physician) and Advanced Practice Providers (APPs -  Physician Assistants and Nurse Practitioners) who all work together to provide you with the care you need, when you need it.  We recommend signing up  for the patient portal called "MyChart".  Sign  up information is provided on this After Visit Summary.  MyChart is used to connect with patients for Virtual Visits (Telemedicine).  Patients are able to view lab/test results, encounter notes, upcoming appointments, etc.  Non-urgent messages can be sent to your provider as well.   To learn more about what you can do with MyChart, go to [email or web address].    Your next appointment:   3-4 month(s)  The format for your next appointment:   In Person  Provider:   Eleonore Chiquito, MD  Other Instructions      Signed, Almyra Deforest, [place]  02/19/2020 11:43 PM    [place]

## 2020-02-19 ENCOUNTER — Encounter: Payer: Self-pay | Admitting: Physician Assistant

## 2020-02-22 DIAGNOSIS — O09513 Supervision of elderly primigravida, third trimester: Secondary | ICD-10-CM | POA: Diagnosis not present

## 2020-02-22 DIAGNOSIS — O34211 Maternal care for low transverse scar from previous cesarean delivery: Secondary | ICD-10-CM | POA: Diagnosis not present

## 2020-02-22 DIAGNOSIS — Z23 Encounter for immunization: Secondary | ICD-10-CM | POA: Diagnosis not present

## 2020-02-22 DIAGNOSIS — Z3A32 32 weeks gestation of pregnancy: Secondary | ICD-10-CM | POA: Diagnosis not present

## 2020-02-22 DIAGNOSIS — O3680X0 Pregnancy with inconclusive fetal viability, not applicable or unspecified: Secondary | ICD-10-CM | POA: Diagnosis not present

## 2020-02-22 DIAGNOSIS — O99013 Anemia complicating pregnancy, third trimester: Secondary | ICD-10-CM | POA: Diagnosis not present

## 2020-02-22 DIAGNOSIS — O4403 Placenta previa specified as without hemorrhage, third trimester: Secondary | ICD-10-CM | POA: Diagnosis not present

## 2020-03-02 NOTE — Progress Notes (Signed)
[place] [phone]   [phone]  INITIAL CONSULT NOTE  Patient Care Team: Patient, No Pcp Per as PCP - General ([place]) O'Neal, Cassie Freer, MD as PCP - Cardiology (Cardiology)  Hematological/Oncological History # Iron Deficiency Anemia 2/2 to GI Blood Loss 1) 01/26/2018: WBC 11.0, Hgb 11.2, Plt 214, MCV 88 2) 01/25/2020: WBC 10.8, Hgb 9.0, MCV 82, Plt 206 3) 03/03/2020: establish care with Dr. Lorenso Courier, patient [redacted] weeks pregnant  CHIEF COMPLAINTS/PURPOSE OF CONSULTATION:  "Iron Deficiency Anemia "  HISTORY OF PRESENTING ILLNESS:  Lynn Roberts 34 y.o. female with medical history significant for obesity s/p gastrectomy with duodenal switch in 2015 (493 lb prior to surgery), atrial tachycardia s/p ablation, and unilateral oophorectomy due to cyst who presents for evaluation of iron deficiency anemia.   On review of the previous records Ms. Mauricia Area was noted to have a normal hemoglobin of 11.2 on 01/26/2018.  That is the last CBC that we have on our records.  Per outside records the patient was noted to have a white blood cell count of 10.8, hemoglobin 9.0, MCV of 82, platelets of 206 on 01/25/2020.  Due to concern for iron deficiency anemia the patient was referred to hematology for further evaluation and management.  On exam today Ms. Omahoney notes that she is currently [redacted] weeks pregnant.  She thinks that her current iron deficiency may have stemmed from the development of hemorrhoid she had due to severe constipation in mid February.  She notes that she was having "quite a bit of blood" in the toilet with each subsequent bowel movement.  She notes that she had exactly 7 days worth of blood, and that this resolved subsequently.  She notes that she was prescribed iron pills which she began taking at the end of February.  She notes that she has not seen any bleeding at all since those 7 days in February.  She notes that the iron has not been  causing any issues for the last 5 weeks or so.  She notes that she has had no issues with constipation and no abdominal discomfort.  On further discussion the patient notes that she underwent weight loss surgery in 2015.  She underwent what was called a duodenal switch and has lost nearly 200 pounds worth of weight.  The patient reports that she has no history of blood disorders other than multiple myeloma in her maternal grandmother.  She reports that she has never had previously had issues with iron deficiency anemia previously.  The patient notes that the symptoms she is experiencing include feeling more tired and ice cravings.  She denies having any shortness of breath or other overt signs of bleeding.  Patient notes that she did have light to regular cycles prior to becoming pregnant.  She notes that she has had a history of polycystic ovary disease and had a cyst removed in the youth with an ovary.  She was told that she may never be able to have a child because of the surgery.  She reports that she only has approximately 1 to 2 days of light bleeding during her period.  Otherwise she denies any fevers, chills, sweats, nausea, vomiting or diarrhea.  A full 10 point ROS is listed below.  MEDICAL HISTORY:  Past Medical History:  Diagnosis Date  . Arthritis of low back   . Complication of anesthesia    is of [name] descent  . GERD (gastroesophageal reflux disease)   . History of  PAT (paroxysmal atrial tachycardia)    had cardiac ablation as a teenager  . Migraines   . Miscarriage 04/16/2016  . Obesity   . Papilloma of breast 10/2014   left  . PCO (polycystic ovaries) 07/14/2017  . PCOD (polycystic ovarian disease)    no menses  . Supervision of normal pregnancy 04/09/2016    [facility] at   7+1 week FOB  Charlann Noss Dating By  [place]  Pap  04/09/16 GC/CT Initial:                36+wks: Genetic Screen NT/IT:  CF screen  Anatomic [place]  Flu vaccine  Tdap Recommended ~ 28wks  Glucose Screen  2 hr GBS  Feed Preference  Contraception  Circumcision  Childbirth Classes  Pediatrician      SURGICAL HISTORY: Past Surgical History:  Procedure Laterality Date  . BREAST LUMPECTOMY Left 2011   papilloma  . BREAST LUMPECTOMY WITH RADIOACTIVE SEED LOCALIZATION Left 10/18/2014   Procedure:  RADIOACTIVE SEED GUIDED EXCISIONAL BREAST BIOPSY;  Surgeon: Alphonsa Overall, MD;  Location: [place];  Service: General;  Laterality: Left;  . BREAST SURGERY Right 07/27/2004   retro-areolar dissection with exc. of large duct  . CARDIAC ELECTROPHYSIOLOGY STUDY AND ABLATION  age 5  . CHOLECYSTECTOMY  04/27/2008  . GASTROPLASTY DUODENAL SWITCH  01/2014  . OVARIAN CYST REMOVAL Bilateral   . TOENAIL EXCISION    . UNILATERAL SALPINGECTOMY      SOCIAL HISTORY: Social History   Socioeconomic History  . Marital status: Married    Spouse name: Not on file  . Number of children: 1  . Years of education: Not on file  . Highest education level: Not on file  Occupational History  . Not on file  Tobacco Use  . Smoking status: Current Every Day Smoker    Packs/day: 0.50    Years: 10.00    Pack years: 5.00    Types: Cigarettes  . Smokeless tobacco: Never Used  Substance and Sexual Activity  . Alcohol use: No    Comment: occ.  . Drug use: No  . Sexual activity: Yes    Birth control/protection: None  Other Topics Concern  . Not on file  Social History Narrative   Orthoptist.    Social Determinants of Health   Financial Resource Strain:   . Difficulty of Paying Living Expenses:   Food Insecurity:   . Worried About Charity fundraiser in the Last Year:   . Arboriculturist in the Last Year:   Transportation Needs:   . Film/video editor (Medical):   Marland Kitchen Lack of Transportation (Non-Medical):   Physical Activity:   . Days of Exercise per Week:   . Minutes of Exercise per Session:   Stress:   . Feeling of Stress :   Social Connections:   .  Frequency of Communication with Friends and Family:   . Frequency of Social Gatherings with Friends and Family:   . Attends Religious Services:   . Active Member of Clubs or Organizations:   . Attends Archivist Meetings:   Marland Kitchen Marital Status:   Intimate Partner Violence:   . Fear of Current or Ex-Partner:   . Emotionally Abused:   Marland Kitchen Physically Abused:   . Sexually Abused:     FAMILY HISTORY: Family History  Problem Relation Age of Onset  . Cancer Paternal Grandfather   . Cancer Maternal Grandmother   .  Heart disease Maternal Grandmother   . Diabetes Maternal Grandfather   . Hypertension Father   . Hyperlipidemia Father   . Hypertension Mother   . Diabetes Mother     ALLERGIES:  is allergic to nsaids; other; sulfa antibiotics; and tramadol.  MEDICATIONS:  Current Outpatient Medications  Medication Sig Dispense Refill  . aspirin 81 MG EC tablet Take 81 mg by mouth daily. Swallow whole.    . cholecalciferol (VITAMIN D3) 25 MCG (1000 UT) tablet Take 1,000 Units by mouth 2 (two) times daily.    Marland Kitchen docusate sodium (COLACE) 100 MG capsule Take 100 mg by mouth 2 (two) times daily.    Marland Kitchen FERREX 150 150 MG capsule Take 150 mg by mouth daily.    . Prenat-FeCbn-FeAsp-Meth-FA-DHA (PRENATE MINI) 18-0.6-0.4-350 MG CAPS Take 1 capsule by mouth daily.     No current facility-administered medications for this visit.    REVIEW OF SYSTEMS:   Constitutional: ( - ) fevers, ( - )  chills , ( - ) night sweats Eyes: ( - ) blurriness of vision, ( - ) double vision, ( - ) watery eyes Ears, nose, mouth, throat, and face: ( - ) mucositis, ( - ) sore throat Respiratory: ( - ) cough, ( - ) dyspnea, ( - ) wheezes Cardiovascular: ( - ) palpitation, ( - ) chest discomfort, ( - ) lower extremity swelling Gastrointestinal:  ( - ) nausea, ( - ) heartburn, ( - ) change in bowel habits Skin: ( - ) abnormal skin rashes Lymphatics: ( - ) new lymphadenopathy, ( - ) easy bruising Neurological: ( - )  numbness, ( - ) tingling, ( - ) new weaknesses Behavioral/Psych: ( - ) mood change, ( - ) new changes  All other systems were reviewed with the patient and are negative.  PHYSICAL EXAMINATION: ECOG PERFORMANCE STATUS: 1 - Symptomatic but completely ambulatory  Vitals:   03/03/20 1422  BP: 115/68  Pulse: 98  Resp: 20  Temp: 97.8 F (36.6 C)  SpO2: 100%   Filed Weights   03/03/20 1422  Weight: (!) 305 lb 14.4 oz (138.8 kg)    GENERAL: well appearing pregnant Caucasian [name] in NAD  SKIN: skin color, texture, turgor are normal, no rashes or significant lesions EYES: conjunctiva are pink and non-injected, sclera clear LUNGS: clear to auscultation and percussion with normal breathing effort HEART: regular rate & rhythm and no murmurs and no lower extremity edema Musculoskeletal: no cyanosis of digits and no clubbing  PSYCH: alert & oriented x 3, fluent speech NEURO: no focal motor/sensory deficits  LABORATORY DATA:  I have reviewed the data as listed CBC Latest Ref Rng & Units 03/03/2020 01/26/2018 09/11/2017  WBC 4.0 - 10.5 K/uL 13.1(H) 11.0(H) 7.5  Hemoglobin 12.0 - 15.0 g/dL 8.9(L) 11.2 11.6  Hematocrit 36.0 - 46.0 % 29.3(L) 34.5 35.0  Platelets 150 - 400 K/uL 219 214 255    CMP Latest Ref Rng & Units 03/03/2020 10/12/2019 10/28/2016  Glucose 70 - 99 mg/dL 71 77 85  BUN 6 - 20 mg/dL '10 8 11  '$ Creatinine 0.44 - 1.00 mg/dL 0.62 0.45(L) 0.40(L)  Sodium 135 - 145 mmol/L 141 136 136  Potassium 3.5 - 5.1 mmol/L 3.3(L) 4.3 3.8  Chloride 98 - 111 mmol/L 109 104 104  CO2 22 - 32 mmol/L '22 20 27  '$ Calcium 8.9 - 10.3 mg/dL 7.7(L) 8.5(L) 8.3(L)  Total Protein 6.5 - 8.1 g/dL 5.7(L) - 5.9(L)  Total Bilirubin 0.3 - 1.2 mg/dL 0.4 -  0.1(L)  Alkaline Phos 38 - 126 U/L 99 - 35(L)  AST 15 - 41 U/L 10(L) - 11(L)  ALT 0 - 44 U/L 10 - 11(L)   PATHOLOGY: None relevant to review.   RADIOGRAPHIC STUDIES: None relevant to review.  No results found.  ASSESSMENT & PLAN LACINDA CURVIN 34 y.o. female with medical history significant for obesity s/p duodenal switch (450 lb weight loss), atrial tachycardia s/p ablation, and unilateral oophorectomy due to cyst who presents for evaluation of iron deficiency anemia.  After review of the labs and discussion with the patient her findings are most consistent with iron deficiency anemia secondary to GI blood loss.  Typically patients who are pregnant have a normal range of hemoglobin which is considerably lower than that of the general population.  Typically the acceptable range for hemoglobin drops as low as 10.5 in pregnancy.  Unfortunately this patient has a hemoglobin in the nines and on check today has not had any improvement with p.o. iron supplementation.  I do believe it would be reasonable to administer IV iron therapy to this patient in order to increase her hemoglobin levels prior to delivery.  Plan to administer IV Feraheme 510 mg q. 7 days x 2 doses.  It is unlikely will be able to see the patient back prior to delivery in order to recheck her hemoglobin levels.  We would recommend hemoglobin checks in the peri delivery time.  We will plan to see the patient back in approximately 3 months time to assure that her hemoglobin levels have appropriately responded to IV iron therapy.  In the interim it would be appropriate for her to continue p.o. iron therapy as long as she is tolerating it well.  Recommend once daily dosing with a source of vitamin C in order to improve absorption.  Of note the patient's gastric bypass surgery may make it difficult for her to appropriately absorb iron and therefore that is the reason she is not responding well to p.o. iron supplementation.  It may be in the future that when she develops iron deficiency anemia she requires IV supplementation.  #Iron Deficiency Anemia 2/2 to Possible Hemorrhoidal Bleeding --today will repeat CBC, CMP, retic panel, and iron panel w/ ferritin --patient has been on PO  iron supplementation with Ferrex for the last 5 weeks --Iron panel and CBC show no improvement in her Hgb with this therapy --recommend patient be treated with IV Feraheme '510mg'$  q7 days x 2 doses --given patients late time in pregnancy it is unlikely we will be able to have her back prior to delivery. Recommend f/u in 3 months time to assure Hgb has improved with iron therapy.   Orders Placed This Encounter  Procedures  . CBC with Differential (Cancer Center Only)    Standing Status:   Future    Number of Occurrences:   1    Standing Expiration Date:   03/03/2021  . CMP ([place] only)    Standing Status:   Future    Number of Occurrences:   1    Standing Expiration Date:   03/03/2021  . Iron and TIBC    Standing Status:   Future    Number of Occurrences:   1    Standing Expiration Date:   03/03/2021  . Ferritin    Standing Status:   Future    Number of Occurrences:   1    Standing Expiration Date:   03/03/2021  . Retic Panel  Standing Status:   Future    Number of Occurrences:   1    Standing Expiration Date:   03/03/2021  . Save Smear (SSMR)    Standing Status:   Future    Number of Occurrences:   1    Standing Expiration Date:   03/03/2021    All questions were answered. The patient knows to call the clinic with any problems, questions or concerns.  A total of more than 60 minutes were spent on this encounter and over half of that time was spent on counseling and coordination of care as outlined above.   Ledell Peoples, MD Department of Hematology/Oncology [place] [facility] Phone: [phone] Pager: [phone] Email: [email or web address]  03/03/2020 7:10 PM

## 2020-03-03 ENCOUNTER — Inpatient Hospital Stay: Payer: 59

## 2020-03-03 ENCOUNTER — Inpatient Hospital Stay: Payer: 59 | Attending: Hematology and Oncology | Admitting: Hematology and Oncology

## 2020-03-03 ENCOUNTER — Other Ambulatory Visit: Payer: Self-pay

## 2020-03-03 ENCOUNTER — Encounter: Payer: Self-pay | Admitting: Hematology and Oncology

## 2020-03-03 VITALS — BP 115/68 | HR 98 | Temp 97.8°F | Resp 20 | Ht 71.0 in | Wt 305.9 lb

## 2020-03-03 DIAGNOSIS — D500 Iron deficiency anemia secondary to blood loss (chronic): Secondary | ICD-10-CM | POA: Insufficient documentation

## 2020-03-03 DIAGNOSIS — D509 Iron deficiency anemia, unspecified: Secondary | ICD-10-CM | POA: Diagnosis not present

## 2020-03-03 DIAGNOSIS — Z9884 Bariatric surgery status: Secondary | ICD-10-CM

## 2020-03-03 DIAGNOSIS — O99013 Anemia complicating pregnancy, third trimester: Secondary | ICD-10-CM | POA: Diagnosis not present

## 2020-03-03 DIAGNOSIS — F17210 Nicotine dependence, cigarettes, uncomplicated: Secondary | ICD-10-CM | POA: Insufficient documentation

## 2020-03-03 DIAGNOSIS — Z3A34 34 weeks gestation of pregnancy: Secondary | ICD-10-CM | POA: Insufficient documentation

## 2020-03-03 DIAGNOSIS — Z7982 Long term (current) use of aspirin: Secondary | ICD-10-CM | POA: Diagnosis not present

## 2020-03-03 DIAGNOSIS — R69 Illness, unspecified: Secondary | ICD-10-CM | POA: Diagnosis not present

## 2020-03-03 LAB — CBC WITH DIFFERENTIAL (CANCER CENTER ONLY)
Abs Immature Granulocytes: 0.06 10*3/uL (ref 0.00–0.07)
Basophils Absolute: 0 10*3/uL (ref 0.0–0.1)
Basophils Relative: 0 %
Eosinophils Absolute: 0 10*3/uL (ref 0.0–0.5)
Eosinophils Relative: 0 %
HCT: 29.3 % — ABNORMAL LOW (ref 36.0–46.0)
Hemoglobin: 8.9 g/dL — ABNORMAL LOW (ref 12.0–15.0)
Immature Granulocytes: 1 %
Lymphocytes Relative: 15 %
Lymphs Abs: 2 10*3/uL (ref 0.7–4.0)
MCH: 24.9 pg — ABNORMAL LOW (ref 26.0–34.0)
MCHC: 30.4 g/dL (ref 30.0–36.0)
MCV: 81.8 fL (ref 80.0–100.0)
Monocytes Absolute: 0.9 10*3/uL (ref 0.1–1.0)
Monocytes Relative: 7 %
Neutro Abs: 10.2 10*3/uL — ABNORMAL HIGH (ref 1.7–7.7)
Neutrophils Relative %: 77 %
Platelet Count: 219 10*3/uL (ref 150–400)
RBC: 3.58 MIL/uL — ABNORMAL LOW (ref 3.87–5.11)
RDW: 15.9 % — ABNORMAL HIGH (ref 11.5–15.5)
WBC Count: 13.1 10*3/uL — ABNORMAL HIGH (ref 4.0–10.5)
nRBC: 0 % (ref 0.0–0.2)

## 2020-03-03 LAB — RETIC PANEL
Immature Retic Fract: 28.6 % — ABNORMAL HIGH (ref 2.3–15.9)
RBC.: 3.62 MIL/uL — ABNORMAL LOW (ref 3.87–5.11)
Retic Count, Absolute: 89.4 10*3/uL (ref 19.0–186.0)
Retic Ct Pct: 2.5 % (ref 0.4–3.1)
Reticulocyte Hemoglobin: 24.7 pg — ABNORMAL LOW (ref >27.9)

## 2020-03-03 LAB — CMP (CANCER CENTER ONLY)
ALT: 10 U/L (ref 0–44)
AST: 10 U/L — ABNORMAL LOW (ref 15–41)
Albumin: 2.5 g/dL — ABNORMAL LOW (ref 3.5–5.0)
Alkaline Phosphatase: 99 U/L (ref 38–126)
Anion gap: 10 (ref 5–15)
BUN: 10 mg/dL (ref 6–20)
CO2: 22 mmol/L (ref 22–32)
Calcium: 7.7 mg/dL — ABNORMAL LOW (ref 8.9–10.3)
Chloride: 109 mmol/L (ref 98–111)
Creatinine: 0.62 mg/dL (ref 0.44–1.00)
GFR, Est AFR Am: >60 mL/min (ref >60)
GFR, Estimated: >60 mL/min (ref >60)
Glucose, Bld: 71 mg/dL (ref 70–99)
Potassium: 3.3 mmol/L — ABNORMAL LOW (ref 3.5–5.1)
Sodium: 141 mmol/L (ref 135–145)
Total Bilirubin: 0.4 mg/dL (ref 0.3–1.2)
Total Protein: 5.7 g/dL — ABNORMAL LOW (ref 6.5–8.1)

## 2020-03-03 LAB — SAVE SMEAR(SSMR), FOR PROVIDER SLIDE REVIEW

## 2020-03-06 ENCOUNTER — Encounter (HOSPITAL_COMMUNITY): Payer: Self-pay | Admitting: Obstetrics and Gynecology

## 2020-03-06 ENCOUNTER — Other Ambulatory Visit: Payer: Self-pay

## 2020-03-06 ENCOUNTER — Inpatient Hospital Stay (HOSPITAL_COMMUNITY)
Admission: AD | Admit: 2020-03-06 | Discharge: 2020-03-06 | Disposition: A | Discharge status: Home / Self Care | Payer: 59 | Attending: Obstetrics and Gynecology | Admitting: Obstetrics and Gynecology

## 2020-03-06 DIAGNOSIS — O09523 Supervision of elderly multigravida, third trimester: Secondary | ICD-10-CM | POA: Diagnosis not present

## 2020-03-06 DIAGNOSIS — O99283 Endocrine, nutritional and metabolic diseases complicating pregnancy, third trimester: Secondary | ICD-10-CM | POA: Diagnosis not present

## 2020-03-06 DIAGNOSIS — O99213 Obesity complicating pregnancy, third trimester: Secondary | ICD-10-CM | POA: Insufficient documentation

## 2020-03-06 DIAGNOSIS — Z3A34 34 weeks gestation of pregnancy: Secondary | ICD-10-CM | POA: Insufficient documentation

## 2020-03-06 DIAGNOSIS — O99613 Diseases of the digestive system complicating pregnancy, third trimester: Secondary | ICD-10-CM | POA: Diagnosis not present

## 2020-03-06 DIAGNOSIS — K219 Gastro-esophageal reflux disease without esophagitis: Secondary | ICD-10-CM | POA: Insufficient documentation

## 2020-03-06 DIAGNOSIS — Z7982 Long term (current) use of aspirin: Secondary | ICD-10-CM | POA: Insufficient documentation

## 2020-03-06 DIAGNOSIS — O368139 Decreased fetal movements, third trimester, other fetus: Secondary | ICD-10-CM | POA: Diagnosis not present

## 2020-03-06 DIAGNOSIS — O99333 Smoking (tobacco) complicating pregnancy, third trimester: Secondary | ICD-10-CM | POA: Diagnosis not present

## 2020-03-06 DIAGNOSIS — Z79899 Other long term (current) drug therapy: Secondary | ICD-10-CM | POA: Insufficient documentation

## 2020-03-06 DIAGNOSIS — Z3689 Encounter for other specified antenatal screening: Secondary | ICD-10-CM

## 2020-03-06 DIAGNOSIS — O368131 Decreased fetal movements, third trimester, fetus 1: Secondary | ICD-10-CM | POA: Diagnosis not present

## 2020-03-06 DIAGNOSIS — E282 Polycystic ovarian syndrome: Secondary | ICD-10-CM | POA: Diagnosis not present

## 2020-03-06 DIAGNOSIS — O4403 Placenta previa specified as without hemorrhage, third trimester: Secondary | ICD-10-CM | POA: Diagnosis not present

## 2020-03-06 DIAGNOSIS — R600 Localized edema: Secondary | ICD-10-CM | POA: Diagnosis not present

## 2020-03-06 LAB — COMPREHENSIVE METABOLIC PANEL
ALT: 13 U/L (ref 0–44)
AST: 13 U/L — ABNORMAL LOW (ref 15–41)
Albumin: 2.4 g/dL — ABNORMAL LOW (ref 3.5–5.0)
Alkaline Phosphatase: 84 U/L (ref 38–126)
Anion gap: 6 (ref 5–15)
BUN: 8 mg/dL (ref 6–20)
CO2: 20 mmol/L — ABNORMAL LOW (ref 22–32)
Calcium: 7.9 mg/dL — ABNORMAL LOW (ref 8.9–10.3)
Chloride: 103 mmol/L (ref 98–111)
Creatinine, Ser: 0.48 mg/dL (ref 0.44–1.00)
GFR calc Af Amer: >60 mL/min (ref >60)
GFR calc non Af Amer: >60 mL/min (ref >60)
Glucose, Bld: 74 mg/dL (ref 70–99)
Potassium: 3.3 mmol/L — ABNORMAL LOW (ref 3.5–5.1)
Sodium: 129 mmol/L — ABNORMAL LOW (ref 135–145)
Total Bilirubin: 0.8 mg/dL (ref 0.3–1.2)
Total Protein: 5.3 g/dL — ABNORMAL LOW (ref 6.5–8.1)

## 2020-03-06 LAB — URINALYSIS, ROUTINE W REFLEX MICROSCOPIC
Bilirubin Urine: NEGATIVE
Glucose, UA: NEGATIVE mg/dL
Ketones, ur: NEGATIVE mg/dL
Leukocytes,Ua: NEGATIVE
Nitrite: NEGATIVE
Protein, ur: NEGATIVE mg/dL
Specific Gravity, Urine: 1.005 (ref 1.005–1.030)
pH: 6 (ref 5.0–8.0)

## 2020-03-06 LAB — FERRITIN: Ferritin: 9 ng/mL — ABNORMAL LOW (ref 11–307)

## 2020-03-06 LAB — CBC
HCT: 29.9 % — ABNORMAL LOW (ref 36.0–46.0)
Hemoglobin: 9 g/dL — ABNORMAL LOW (ref 12.0–15.0)
MCH: 23.9 pg — ABNORMAL LOW (ref 26.0–34.0)
MCHC: 30.1 g/dL (ref 30.0–36.0)
MCV: 79.5 fL — ABNORMAL LOW (ref 80.0–100.0)
Platelets: 218 10*3/uL (ref 150–400)
RBC: 3.76 MIL/uL — ABNORMAL LOW (ref 3.87–5.11)
RDW: 15.9 % — ABNORMAL HIGH (ref 11.5–15.5)
WBC: 12.5 10*3/uL — ABNORMAL HIGH (ref 4.0–10.5)
nRBC: 0 % (ref 0.0–0.2)

## 2020-03-06 LAB — PROTEIN / CREATININE RATIO, URINE
Creatinine, Urine: 26.26 mg/dL
Total Protein, Urine: <6 mg/dL

## 2020-03-06 LAB — IRON AND TIBC
Iron: 22 ug/dL — ABNORMAL LOW (ref 41–142)
Saturation Ratios: 5 % — ABNORMAL LOW (ref 21–57)
TIBC: 493 ug/dL — ABNORMAL HIGH (ref 236–444)
UIBC: 470 ug/dL — ABNORMAL HIGH (ref 120–384)

## 2020-03-06 MED ORDER — FAMOTIDINE 20 MG PO TABS
20.0000 mg | ORAL_TABLET | Freq: Once | ORAL | Status: AC
  Administered 2020-03-06: 18:00:00 20 mg via ORAL
  Filled 2020-03-06: qty 1

## 2020-03-06 MED ORDER — FAMOTIDINE 20 MG PO TABS: 20.0000 mg | ORAL_TABLET | Freq: Two times a day (BID) | ORAL | 0 refills | Status: DC

## 2020-03-06 NOTE — MAU Provider Note (Addendum)
History     CSN: [id number]  Arrival date and time: 03/06/20 1711   First Provider Initiated Contact with Patient 03/06/20 1806      Chief Complaint  Patient presents with  . Foot Swelling  . visual changes  . Decreased Fetal Movement   Lynn Roberts is a 34 y.o. [id number] at [redacted]w[redacted]d who receives care at [facility].  She presents today for Foot Swelling.  She states she noticed swelling over the weekend. She states it causes burn and pain with movement.  She reports she was seen in the office today and was told to come for PreEclampsia work up. She endorses fetal movement and denies abdominal cramping or contractions.  She also denies vaginal discharge, bleeding, leaking, itching, burning, or odor.  She denies HA, but reports "blurry vision sort of like snowy, hazy, cloudy, light show."  However, patient states the visual disturbances has been intermittent over the past few weeks.  She goes on to report intermittent headaches, but none currently.  She denies problems during the pregnancy with exception of "the placenta is low and at 32 weeks they said it was completely covering my cervix now."   Patient also reports some ongoing heartburn that has been unrelieved with tums.    OB History    Gravida  3   Para  1   Term      Preterm  1   AB  1   Living        SAB  1   TAB      Ectopic      Multiple  0   Live Births              Past Medical History:  Diagnosis Date  . Arthritis of low back   . Complication of anesthesia    is of [name] descent  . GERD (gastroesophageal reflux disease)   . History of PAT (paroxysmal atrial tachycardia)    had cardiac ablation as a teenager  . Migraines   . Miscarriage 04/16/2016  . Obesity   . Papilloma of breast 10/2014   left  . PCO (polycystic ovaries) 07/14/2017  . PCOD (polycystic ovarian disease)    no menses  . Supervision of normal pregnancy 04/09/2016    [facility] Initiated Care at   7+1 week FOB  Charlann Noss Dating By  [place]  Pap  04/09/16 GC/CT Initial:                36+wks: Genetic Screen NT/IT:  CF screen  Anatomic [place]  Flu vaccine  Tdap Recommended ~ 28wks Glucose Screen  2 hr GBS  Feed Preference  Contraception  Circumcision  Childbirth Classes  Pediatrician      Past Surgical History:  Procedure Laterality Date  . BREAST LUMPECTOMY Left 2011   papilloma  . BREAST LUMPECTOMY WITH RADIOACTIVE SEED LOCALIZATION Left 10/18/2014   Procedure:  RADIOACTIVE SEED GUIDED EXCISIONAL BREAST BIOPSY;  Surgeon: Alphonsa Overall, MD;  Location: [place];  Service: General;  Laterality: Left;  . BREAST SURGERY Right 07/27/2004   retro-areolar dissection with exc. of large duct  . CARDIAC ELECTROPHYSIOLOGY STUDY AND ABLATION  age 6  . CHOLECYSTECTOMY  04/27/2008  . GASTROPLASTY DUODENAL SWITCH  01/2014  . OVARIAN CYST REMOVAL Bilateral   . TOENAIL EXCISION    . UNILATERAL SALPINGECTOMY      Family History  Problem Relation Age of Onset  . Cancer Paternal Grandfather   .  Cancer Maternal Grandmother   . Heart disease Maternal Grandmother   . Diabetes Maternal Grandfather   . Hypertension Father   . Hyperlipidemia Father   . Hypertension Mother   . Diabetes Mother     Social History   Tobacco Use  . Smoking status: Current Every Day Smoker    Packs/day: 0.50    Years: 10.00    Pack years: 5.00    Types: Cigarettes  . Smokeless tobacco: Never Used  Substance Use Topics  . Alcohol use: No    Comment: occ.  . Drug use: No    Allergies:  Allergies  Allergen Reactions  . Nsaids Other (See Comments)    HX. OF WEIGHT-LOSS SURGERY HX. OF WEIGHT-LOSS SURGERY  . Other Rash    LOTIONS   . Sulfa Antibiotics Rash  . Tramadol Rash    Medications Prior to Admission  Medication Sig Dispense Refill Last Dose  . aspirin 81 MG EC tablet Take 81 mg by mouth daily. Swallow whole.   03/06/2020 at Unknown time  . cholecalciferol (VITAMIN D3) 25 MCG (1000 UT) tablet Take 1,000 Units by  mouth 2 (two) times daily.   03/06/2020 at Unknown time  . docusate sodium (COLACE) 100 MG capsule Take 100 mg by mouth 2 (two) times daily.   03/06/2020 at Unknown time  . FERREX 150 150 MG capsule Take 150 mg by mouth daily.   03/06/2020 at Unknown time  . Prenat-FeCbn-FeAsp-Meth-FA-DHA (PRENATE MINI) 18-0.6-0.4-350 MG CAPS Take 1 capsule by mouth daily.   03/06/2020 at Unknown time    Review of Systems  Constitutional: Negative for chills and fever.  Eyes: Positive for visual disturbance.  Respiratory: Negative for cough, chest tightness and shortness of breath.   Cardiovascular: Negative for chest pain.  Gastrointestinal: Negative for abdominal pain, nausea and vomiting.  Genitourinary: Negative for difficulty urinating, dysuria, pelvic pain, vaginal bleeding and vaginal discharge.  Neurological: Positive for headaches. Negative for dizziness and light-headedness.   Physical Exam   Blood pressure 123/81, pulse 80, temperature 98.2 F (36.8 C), temperature source Oral, resp. rate 18, height 5\' 11"  (1.803 m), weight (!) 141.5 kg, SpO2 100 %, unknown if currently breastfeeding.  Vitals:   03/06/20 1900 03/06/20 1916 03/06/20 1931 03/06/20 1946  BP: (!) 120/59 116/65 (!) 115/58 119/62  Pulse: 78 72 78 83  Resp:      Temp:      TempSrc:      SpO2:      Weight:      Height:        Physical Exam  Constitutional: She is oriented to person, place, and time. She appears well-developed and well-nourished.  Morbidly Obese  HENT:  Head: Normocephalic and atraumatic.  Eyes: Conjunctivae are normal.  Cardiovascular: Normal rate, regular rhythm and normal heart sounds.  Respiratory: Effort normal.  GI: Soft.  Musculoskeletal:        General: Edema present. Normal range of motion.     Cervical back: Normal range of motion.     Right lower leg: Edema present.     Left lower leg: Edema present.     Comments: +3 Pitting  Neurological: She is alert and oriented to person, place, and time.   Skin: Skin is warm, dry and intact.  Area of petechia below left knee ~4x2cm  Psychiatric: She has a normal mood and affect. Her behavior is normal.    Fetal Assessment 120 bpm, Mod Var, -Decels, +Accels Toco: Irritability Noted  MAU Course  Results for orders placed or performed during the hospital encounter of 03/06/20 (from the past 24 hour(s))  Urinalysis, Routine w reflex microscopic     Status: Abnormal   Collection Time: 03/06/20  5:21 PM  Result Value Ref Range   Color, Urine YELLOW YELLOW   APPearance CLEAR CLEAR   Specific Gravity, Urine 1.005 1.005 - 1.030   pH 6.0 5.0 - 8.0   Glucose, UA NEGATIVE NEGATIVE mg/dL   Hgb urine dipstick SMALL (A) NEGATIVE   Bilirubin Urine NEGATIVE NEGATIVE   Ketones, ur NEGATIVE NEGATIVE mg/dL   Protein, ur NEGATIVE NEGATIVE mg/dL   Nitrite NEGATIVE NEGATIVE   Leukocytes,Ua NEGATIVE NEGATIVE   RBC / HPF 0-5 0 - 5 RBC/hpf   WBC, UA 0-5 0 - 5 WBC/hpf   Bacteria, UA RARE (A) NONE SEEN   Squamous Epithelial / LPF 0-5 0 - 5   Mucus PRESENT    Ca Oxalate Crys, UA PRESENT   CBC     Status: Abnormal   Collection Time: 03/06/20  6:44 PM  Result Value Ref Range   WBC 12.5 (H) 4.0 - 10.5 K/uL   RBC 3.76 (L) 3.87 - 5.11 MIL/uL   Hemoglobin 9.0 (L) 12.0 - 15.0 g/dL   HCT 29.9 (L) 36.0 - 46.0 %   MCV 79.5 (L) 80.0 - 100.0 fL   MCH 23.9 (L) 26.0 - 34.0 pg   MCHC 30.1 30.0 - 36.0 g/dL   RDW 15.9 (H) 11.5 - 15.5 %   Platelets 218 150 - 400 K/uL   nRBC 0.0 0.0 - 0.2 %  Comprehensive metabolic panel     Status: Abnormal   Collection Time: 03/06/20  6:44 PM  Result Value Ref Range   Sodium 129 (L) 135 - 145 mmol/L   Potassium 3.3 (L) 3.5 - 5.1 mmol/L   Chloride 103 98 - 111 mmol/L   CO2 20 (L) 22 - 32 mmol/L   Glucose, Bld 74 70 - 99 mg/dL   BUN 8 6 - 20 mg/dL   Creatinine, Ser 0.48 0.44 - 1.00 mg/dL   Calcium 7.9 (L) 8.9 - 10.3 mg/dL   Total Protein 5.3 (L) 6.5 - 8.1 g/dL   Albumin 2.4 (L) 3.5 - 5.0 g/dL   AST 13 (L) 15 - 41 U/L    ALT 13 0 - 44 U/L   Alkaline Phosphatase 84 38 - 126 U/L   Total Bilirubin 0.8 0.3 - 1.2 mg/dL   GFR calc non Af Amer >60 >60 mL/min   GFR calc Af Amer >60 >60 mL/min   Anion gap 6 5 - 15   No results found.  MDM Physical Exam Labs: CBC, CMP, PC Ratio Measure BPQ15 min EFM Assessment and Plan  33 year old G3P0111  SIUP at 34weeks Cat I FT BLE Visual Disturbances Placenta Previa Heartburn  -Patient informed that she has a placenta previa and should identify that when asked about problems during the pregnancy. -Patient verbalizes understanding.  -POC Reviewed -Exam findings discussed. -Labs ordered. -Informed that results take ~2 hours. -Offered and accepts pepcid for heartburn. Discussed long-term usage would be appropriate.  -Will await results.  -NST reactive, can discontinue EFM.   Maryann Conners MSN, CNM 03/06/2020, 6:06 PM   Reassessment (7:48 PM) Labs Normal  -PreEclampsia labs return without significance for preeclampsia. -BP remain normotensive. -Results discussed with patient. -Patient reports that she is scheduled for Feraheme infusions and has seen a hematologist for her anemia.  -Patient reports improvement in heartburn  with pepcid dosing. -Rx for 20mg  daily sent to pharmacy on file.  -Instructed to keep appt with Dr. Wilhelmenia Blase for Wednesday. -Encouraged to call or return to MAU if symptoms worsen or with the onset of new symptoms. -Patient requests and given work excuse. -Discharged to home in stable condition.  Maryann Conners MSN, CNM Advanced Practice Provider, [facility]

## 2020-03-06 NOTE — Discharge Instructions (Signed)
Placenta Previa Placenta previa is a condition in which the placenta implants in the lower part of the uterus in pregnant women. The placenta either partially or completely covers the opening to the cervix. This is a problem because the baby must pass through the cervix during delivery. There are three types of placenta previa:  Marginal placenta previa. The placenta reaches within an inch (2.5 cm) of the cervical opening but does not cover it.  Partial placenta previa. The placenta covers part of the cervical opening.  Complete placenta previa. The placenta covers the entire cervical opening. If the previa is marginal or partial and it is diagnosed in the first half of pregnancy, the placenta may move into a normal position as the pregnancy progresses and may no longer cover the cervix. It is important to keep all prenatal visits with your health care provider so you can be more closely monitored. What are the causes? The cause of this condition is not known. What increases the risk? This condition is more likely to develop in women who:  Are carrying more than one baby (multiples).  Have an abnormally shaped uterus.  Have scars on the lining of the uterus.  Have had surgeries involving the uterus, such as a cesarean delivery.  Have delivered a baby before.  Have a history of placenta previa.  Have smoked or used cocaine during pregnancy.  Are age 36 or older during pregnancy. What are the signs or symptoms? The main symptom of this condition is sudden, painless vaginal bleeding during the second half of pregnancy. The amount of bleeding can be very light at first, and it usually stops on its own. Heavier bleeding episodes may also happen. Some women with placenta previa may have no bleeding at all. How is this diagnosed?  This condition is diagnosed: ? From an ultrasound. This test uses sound waves to find where the placenta is located before you have any bleeding  episodes. ? During a checkup after vaginal bleeding is noticed.  If you are diagnosed with a partial or complete previa, digital exams with fingers will generally be avoided. Your health care provider will still perform a speculum exam.  If you did not have an ultrasound during your pregnancy, placenta previa may not be diagnosed until bleeding occurs during labor. How is this treated? Treatment for this condition may include:  Decreased activity.  Bed rest at home or in the hospital.  Pelvic rest. Nothing is placed inside the vagina during pelvic rest. This means not having sex and not using tampons or douches.  A blood transfusion to replace blood that you have lost (maternal blood loss).  A cesarean delivery. This may be performed if: ? The bleeding is heavy and cannot be controlled. ? The placenta completely covers the cervix.  Medicines to stop premature labor or to help the baby's lungs to mature. This treatment may be used if you need delivery before your pregnancy is full-term. Your treatment will be decided based on:  How much you are bleeding, or whether the bleeding has stopped.  How far along you are in your pregnancy.  The condition of your baby.  The type of placenta previa that you have.  Follow these instructions at home:  Get plenty of rest and lessen activity as told by your health care provider.  Stay on bed rest for as long as told by your health care provider.  Do not have sex, use tampons, use a douche, or place anything inside of  your vagina if your health care provider recommended pelvic rest.  Take over-the-counter and prescription medicines as told by your health care provider.  Keep all follow-up visits as told by your health care provider. This is important. Get help right away if:  You have vaginal bleeding, even if in small amounts and even if you have no pain.  You have cramping or regular contractions.  You have pain in your abdomen or  your lower back.  You have a feeling of increased pressure in your pelvis.  You have increased watery or bloody mucus from the vagina. This information is not intended to replace advice given to you by your health care provider. Make sure you discuss any questions you have with your health care provider. Document Revised: 10/31/2017 Document Reviewed: 06/01/2016 Elsevier Patient Education  [place]. Edema  Edema is an abnormal buildup of fluids in the body tissues and under the skin. Swelling of the legs, feet, and ankles is a common symptom that becomes more likely as you get older. Swelling is also common in looser tissues, like around the eyes. When the affected area is squeezed, the fluid may move out of that spot and leave a dent for a few moments. This dent is called pitting edema. There are many possible causes of edema. Eating too much salt (sodium) and being on your feet or sitting for a long time can cause edema in your legs, feet, and ankles. Hot weather may make edema worse. Common causes of edema include:  Heart failure.  Liver or kidney disease.  Weak leg blood vessels.  Cancer.  An injury.  Pregnancy.  Medicines.  Being obese.  Low protein levels in the blood. Edema is usually painless. Your skin may look swollen or shiny. Follow these instructions at home:  Keep the affected body part raised (elevated) above the level of your heart when you are sitting or lying down.  Do not sit still or stand for long periods of time.  Do not wear tight clothing. Do not wear garters on your upper legs.  Exercise your legs to get your circulation going. This helps to move the fluid back into your blood vessels, and it may help the swelling go down.  Wear elastic bandages or support stockings to reduce swelling as told by your health care provider.  Eat a low-salt (low-sodium) diet to reduce fluid as told by your health care provider.  Depending on the cause of  your swelling, you may need to limit how much fluid you drink (fluid restriction).  Take over-the-counter and prescription medicines only as told by your health care provider. Contact a health care provider if:  Your edema does not get better with treatment.  You have heart, liver, or kidney disease and have symptoms of edema.  You have sudden and unexplained weight gain. Get help right away if:  You develop shortness of breath or chest pain.  You cannot breathe when you lie down.  You develop pain, redness, or warmth in the swollen areas.  You have heart, liver, or kidney disease and suddenly get edema.  You have a fever and your symptoms suddenly get worse. Summary  Edema is an abnormal buildup of fluids in the body tissues and under the skin.  Eating too much salt (sodium) and being on your feet or sitting for a long time can cause edema in your legs, feet, and ankles.  Keep the affected body part raised (elevated) above the level of your  heart when you are sitting or lying down. This information is not intended to replace advice given to you by your health care provider. Make sure you discuss any questions you have with your health care provider. Document Revised: 04/07/2019 Document Reviewed: 12/21/2016 Elsevier Patient Education  [place].

## 2020-03-07 ENCOUNTER — Telehealth: Payer: Self-pay | Admitting: Hematology and Oncology

## 2020-03-07 NOTE — Telephone Encounter (Signed)
Scheduled per los. Called and left msg. Mailed printout  °

## 2020-03-08 DIAGNOSIS — R8279 Other abnormal findings on microbiological examination of urine: Secondary | ICD-10-CM | POA: Diagnosis not present

## 2020-03-09 ENCOUNTER — Encounter: Payer: Self-pay | Admitting: Hematology and Oncology

## 2020-03-13 ENCOUNTER — Other Ambulatory Visit: Payer: Self-pay | Admitting: Hematology and Oncology

## 2020-03-13 ENCOUNTER — Ambulatory Visit: Payer: 59

## 2020-03-13 DIAGNOSIS — D500 Iron deficiency anemia secondary to blood loss (chronic): Secondary | ICD-10-CM

## 2020-03-13 NOTE — Progress Notes (Signed)
era

## 2020-03-16 NOTE — Patient Instructions (Signed)
MICKALA POTEAT  03/16/2020   Your procedure is scheduled on:  03/28/2020  Arrive at 1000 at Entrance C on [address] at [facility]  and [address]. You are invited to use the FREE valet parking or use the Visitor's parking deck.  Pick up the phone at the desk and dial [phone].  Call this number if you have problems the morning of surgery: [phone]  Remember:   Do not eat food:(After Midnight) Desps de medianoche.  Do not drink clear liquids: (After Midnight) Desps de medianoche.  Take these medicines the morning of surgery with A SIP OF WATER:  none   Do not wear jewelry, make-up or nail polish.  Do not wear lotions, powders, or perfumes. Do not wear deodorant.  Do not shave 48 hours prior to surgery.  Do not bring valuables to the hospital.  [facility] is not   responsible for any belongings or valuables brought to the hospital.  Contacts, dentures or bridgework may not be worn into surgery.  Leave suitcase in the car. After surgery it may be brought to your room.  For patients admitted to the hospital, checkout time is 11:00 AM the day of              discharge.      Please read over the following fact sheets that you were given:     Preparing for Surgery

## 2020-03-17 ENCOUNTER — Ambulatory Visit (HOSPITAL_COMMUNITY)
Admission: RE | Admit: 2020-03-17 | Discharge: 2020-03-17 | Disposition: A | Discharge status: Home / Self Care | Payer: 59 | Source: Ambulatory Visit | Attending: Hematology and Oncology | Admitting: Hematology and Oncology

## 2020-03-17 ENCOUNTER — Telehealth (HOSPITAL_COMMUNITY): Payer: Self-pay | Admitting: *Deleted

## 2020-03-17 ENCOUNTER — Encounter (HOSPITAL_COMMUNITY): Payer: Self-pay

## 2020-03-17 ENCOUNTER — Other Ambulatory Visit: Payer: Self-pay

## 2020-03-17 DIAGNOSIS — D500 Iron deficiency anemia secondary to blood loss (chronic): Secondary | ICD-10-CM | POA: Diagnosis not present

## 2020-03-17 MED ORDER — SODIUM CHLORIDE 0.9 % IV SOLN
510.0000 mg | INTRAVENOUS | Status: DC
  Administered 2020-03-17: 510 mg via INTRAVENOUS
  Filled 2020-03-17: qty 17

## 2020-03-17 NOTE — Discharge Instructions (Signed)

## 2020-03-17 NOTE — Telephone Encounter (Signed)
Preadmission screen  

## 2020-03-20 ENCOUNTER — Ambulatory Visit: Payer: 59

## 2020-03-21 DIAGNOSIS — Z3685 Encounter for antenatal screening for Streptococcus B: Secondary | ICD-10-CM | POA: Diagnosis not present

## 2020-03-22 ENCOUNTER — Encounter (HOSPITAL_COMMUNITY): Payer: Self-pay | Admitting: Obstetrics and Gynecology

## 2020-03-24 ENCOUNTER — Other Ambulatory Visit: Payer: Self-pay

## 2020-03-24 ENCOUNTER — Ambulatory Visit (HOSPITAL_COMMUNITY)
Admission: RE | Admit: 2020-03-24 | Discharge: 2020-03-24 | Disposition: A | Discharge status: Home / Self Care | Payer: 59 | Source: Ambulatory Visit | Attending: Hematology and Oncology | Admitting: Hematology and Oncology

## 2020-03-24 DIAGNOSIS — D500 Iron deficiency anemia secondary to blood loss (chronic): Secondary | ICD-10-CM

## 2020-03-24 MED ORDER — SODIUM CHLORIDE 0.9 % IV SOLN
510.0000 mg | INTRAVENOUS | Status: AC
  Administered 2020-03-24: 510 mg via INTRAVENOUS
  Filled 2020-03-24: qty 17

## 2020-03-26 ENCOUNTER — Other Ambulatory Visit: Payer: Self-pay

## 2020-03-26 ENCOUNTER — Other Ambulatory Visit (HOSPITAL_COMMUNITY)
Admission: RE | Admit: 2020-03-26 | Discharge: 2020-03-26 | Disposition: A | Discharge status: Home / Self Care | Payer: No Typology Code available for payment source | Source: Ambulatory Visit | Attending: Obstetrics and Gynecology | Admitting: Obstetrics and Gynecology

## 2020-03-26 DIAGNOSIS — Z20822 Contact with and (suspected) exposure to covid-19: Secondary | ICD-10-CM | POA: Insufficient documentation

## 2020-03-26 DIAGNOSIS — Z01812 Encounter for preprocedural laboratory examination: Secondary | ICD-10-CM | POA: Insufficient documentation

## 2020-03-26 HISTORY — DX: Family history of other specified conditions: Z84.89

## 2020-03-26 LAB — TYPE AND SCREEN
ABO/RH(D): B POS
Antibody Screen: NEGATIVE

## 2020-03-26 LAB — ABO/RH: ABO/RH(D): B POS

## 2020-03-26 LAB — CBC
HCT: 34.6 % — ABNORMAL LOW (ref 36.0–46.0)
Hemoglobin: 10 g/dL — ABNORMAL LOW (ref 12.0–15.0)
MCH: 24.1 pg — ABNORMAL LOW (ref 26.0–34.0)
MCHC: 28.9 g/dL — ABNORMAL LOW (ref 30.0–36.0)
MCV: 83.4 fL (ref 80.0–100.0)
Platelets: 180 10*3/uL (ref 150–400)
RBC: 4.15 MIL/uL (ref 3.87–5.11)
RDW: 23.4 % — ABNORMAL HIGH (ref 11.5–15.5)
WBC: 10.9 10*3/uL — ABNORMAL HIGH (ref 4.0–10.5)
nRBC: 0 % (ref 0.0–0.2)

## 2020-03-26 LAB — SARS CORONAVIRUS 2 (TAT 6-24 HRS): SARS Coronavirus 2: NEGATIVE

## 2020-03-26 NOTE — MAU Note (Signed)
Pt here for covid swab and lab draw.  Pt denies any other complaints. Instructions reviewed, pt verbalized understanding.

## 2020-03-27 LAB — RPR: RPR Ser Ql: NONREACTIVE

## 2020-03-28 ENCOUNTER — Inpatient Hospital Stay (HOSPITAL_COMMUNITY)
Admission: RE | Admit: 2020-03-28 | Discharge: 2020-03-30 | DRG: 788 | Disposition: A | Discharge status: Home / Self Care | Payer: No Typology Code available for payment source | Attending: Obstetrics and Gynecology | Admitting: Obstetrics and Gynecology

## 2020-03-28 ENCOUNTER — Inpatient Hospital Stay (HOSPITAL_COMMUNITY): Payer: No Typology Code available for payment source | Admitting: Certified Registered Nurse Anesthetist

## 2020-03-28 ENCOUNTER — Encounter (HOSPITAL_COMMUNITY): Payer: Self-pay | Admitting: Obstetrics and Gynecology

## 2020-03-28 ENCOUNTER — Other Ambulatory Visit: Payer: Self-pay

## 2020-03-28 ENCOUNTER — Encounter (HOSPITAL_COMMUNITY)
Admission: RE | Disposition: A | Discharge status: Home / Self Care | Payer: Self-pay | Source: Home / Self Care | Attending: Obstetrics and Gynecology

## 2020-03-28 DIAGNOSIS — O99824 Streptococcus B carrier state complicating childbirth: Secondary | ICD-10-CM | POA: Diagnosis present

## 2020-03-28 DIAGNOSIS — Z20822 Contact with and (suspected) exposure to covid-19: Secondary | ICD-10-CM | POA: Diagnosis not present

## 2020-03-28 DIAGNOSIS — Z3A37 37 weeks gestation of pregnancy: Secondary | ICD-10-CM

## 2020-03-28 DIAGNOSIS — O34219 Maternal care for unspecified type scar from previous cesarean delivery: Secondary | ICD-10-CM | POA: Diagnosis not present

## 2020-03-28 DIAGNOSIS — D649 Anemia, unspecified: Secondary | ICD-10-CM | POA: Diagnosis present

## 2020-03-28 DIAGNOSIS — O9902 Anemia complicating childbirth: Secondary | ICD-10-CM | POA: Diagnosis not present

## 2020-03-28 DIAGNOSIS — F17210 Nicotine dependence, cigarettes, uncomplicated: Secondary | ICD-10-CM | POA: Diagnosis present

## 2020-03-28 DIAGNOSIS — O34211 Maternal care for low transverse scar from previous cesarean delivery: Secondary | ICD-10-CM | POA: Diagnosis present

## 2020-03-28 DIAGNOSIS — O99214 Obesity complicating childbirth: Secondary | ICD-10-CM | POA: Diagnosis present

## 2020-03-28 DIAGNOSIS — O99334 Smoking (tobacco) complicating childbirth: Secondary | ICD-10-CM | POA: Diagnosis present

## 2020-03-28 DIAGNOSIS — O4400 Placenta previa specified as without hemorrhage, unspecified trimester: Secondary | ICD-10-CM | POA: Diagnosis present

## 2020-03-28 DIAGNOSIS — R69 Illness, unspecified: Secondary | ICD-10-CM | POA: Diagnosis not present

## 2020-03-28 DIAGNOSIS — O4403 Placenta previa specified as without hemorrhage, third trimester: Secondary | ICD-10-CM | POA: Diagnosis not present

## 2020-03-28 DIAGNOSIS — O411230 Chorioamnionitis, third trimester, not applicable or unspecified: Secondary | ICD-10-CM | POA: Diagnosis not present

## 2020-03-28 SURGERY — Surgical Case
Anesthesia: Spinal

## 2020-03-28 MED ORDER — NALBUPHINE HCL 10 MG/ML IJ SOLN: 5.0000 mg | INTRAMUSCULAR | Status: DC | PRN

## 2020-03-28 MED ORDER — DEXTROSE 5 % IV SOLN
3.0000 g | INTRAVENOUS | Status: AC
  Administered 2020-03-28: 3 g via INTRAVENOUS

## 2020-03-28 MED ORDER — DEXTROSE 5 % IV SOLN
INTRAVENOUS | Status: AC
  Filled 2020-03-28: qty 3000

## 2020-03-28 MED ORDER — OXYTOCIN 40 UNITS IN NORMAL SALINE INFUSION - SIMPLE MED: 2.5000 [IU]/h | INTRAVENOUS | Status: AC

## 2020-03-28 MED ORDER — PHENYLEPHRINE HCL-NACL 20-0.9 MG/250ML-% IV SOLN
INTRAVENOUS | Status: DC | PRN
  Administered 2020-03-28: 60 ug/min via INTRAVENOUS

## 2020-03-28 MED ORDER — ACETAMINOPHEN 10 MG/ML IV SOLN
1000.0000 mg | Freq: Once | INTRAVENOUS | Status: DC | PRN
  Administered 2020-03-28: 1000 mg via INTRAVENOUS

## 2020-03-28 MED ORDER — ONDANSETRON HCL 4 MG/2ML IJ SOLN
INTRAMUSCULAR | Status: AC
  Filled 2020-03-28: qty 2

## 2020-03-28 MED ORDER — OXYTOCIN 40 UNITS IN NORMAL SALINE INFUSION - SIMPLE MED
INTRAVENOUS | Status: DC | PRN
  Administered 2020-03-28: 40 [IU] via INTRAVENOUS

## 2020-03-28 MED ORDER — DIPHENHYDRAMINE HCL 25 MG PO CAPS
25.0000 mg | ORAL_CAPSULE | ORAL | Status: DC | PRN
  Filled 2020-03-28: qty 1

## 2020-03-28 MED ORDER — NALOXONE HCL 4 MG/10ML IJ SOLN
1.0000 ug/kg/h | INTRAVENOUS | Status: DC | PRN
  Filled 2020-03-28: qty 5

## 2020-03-28 MED ORDER — SENNOSIDES-DOCUSATE SODIUM 8.6-50 MG PO TABS
2.0000 | ORAL_TABLET | ORAL | Status: DC
  Administered 2020-03-28 – 2020-03-29 (×2): 2 via ORAL
  Filled 2020-03-28 (×2): qty 2

## 2020-03-28 MED ORDER — DIPHENHYDRAMINE HCL 25 MG PO CAPS
25.0000 mg | ORAL_CAPSULE | Freq: Four times a day (QID) | ORAL | Status: DC | PRN
  Administered 2020-03-29 (×2): 25 mg via ORAL
  Filled 2020-03-28: qty 1

## 2020-03-28 MED ORDER — SODIUM CHLORIDE 0.9% FLUSH: 3.0000 mL | INTRAVENOUS | Status: DC | PRN

## 2020-03-28 MED ORDER — TETANUS-DIPHTH-ACELL PERTUSSIS 5-2.5-18.5 LF-MCG/0.5 IM SUSP: 0.5000 mL | Freq: Once | INTRAMUSCULAR | Status: DC

## 2020-03-28 MED ORDER — MORPHINE SULFATE (PF) 0.5 MG/ML IJ SOLN
INTRAMUSCULAR | Status: AC
  Filled 2020-03-28: qty 10

## 2020-03-28 MED ORDER — NALBUPHINE HCL 10 MG/ML IJ SOLN
5.0000 mg | INTRAMUSCULAR | Status: DC | PRN
  Administered 2020-03-28 – 2020-03-29 (×2): 5 mg via INTRAVENOUS
  Filled 2020-03-28 (×2): qty 1

## 2020-03-28 MED ORDER — SOD CITRATE-CITRIC ACID 500-334 MG/5ML PO SOLN
30.0000 mL | ORAL | Status: AC
  Administered 2020-03-28: 30 mL via ORAL

## 2020-03-28 MED ORDER — ACETAMINOPHEN 500 MG PO TABS: 1000.0000 mg | ORAL_TABLET | Freq: Four times a day (QID) | ORAL | Status: DC

## 2020-03-28 MED ORDER — SOD CITRATE-CITRIC ACID 500-334 MG/5ML PO SOLN
ORAL | Status: AC
  Filled 2020-03-28: qty 30

## 2020-03-28 MED ORDER — SCOPOLAMINE 1 MG/3DAYS TD PT72
MEDICATED_PATCH | TRANSDERMAL | Status: AC
  Filled 2020-03-28: qty 1

## 2020-03-28 MED ORDER — BUPIVACAINE IN DEXTROSE 0.75-8.25 % IT SOLN
INTRATHECAL | Status: DC | PRN
  Administered 2020-03-28: 1.6 mL via INTRATHECAL

## 2020-03-28 MED ORDER — SCOPOLAMINE 1 MG/3DAYS TD PT72
1.0000 | MEDICATED_PATCH | Freq: Once | TRANSDERMAL | Status: DC
  Administered 2020-03-28: 1.5 mg via TRANSDERMAL

## 2020-03-28 MED ORDER — PRENATAL MULTIVITAMIN CH
1.0000 | ORAL_TABLET | Freq: Every day | ORAL | Status: DC
  Administered 2020-03-29 – 2020-03-30 (×2): 1 via ORAL
  Filled 2020-03-28 (×2): qty 1

## 2020-03-28 MED ORDER — LACTATED RINGERS IV SOLN: INTRAVENOUS | Status: DC

## 2020-03-28 MED ORDER — ACETAMINOPHEN 10 MG/ML IV SOLN
INTRAVENOUS | Status: AC
  Filled 2020-03-28: qty 100

## 2020-03-28 MED ORDER — WITCH HAZEL-GLYCERIN EX PADS
1.0000 "application " | MEDICATED_PAD | CUTANEOUS | Status: DC | PRN
  Administered 2020-03-29: 1 via TOPICAL

## 2020-03-28 MED ORDER — DEXAMETHASONE SODIUM PHOSPHATE 10 MG/ML IJ SOLN
INTRAMUSCULAR | Status: AC
  Filled 2020-03-28: qty 1

## 2020-03-28 MED ORDER — SIMETHICONE 80 MG PO CHEW
80.0000 mg | CHEWABLE_TABLET | ORAL | Status: DC
  Administered 2020-03-28 – 2020-03-29 (×2): 80 mg via ORAL
  Filled 2020-03-28 (×3): qty 1

## 2020-03-28 MED ORDER — OXYTOCIN 40 UNITS IN NORMAL SALINE INFUSION - SIMPLE MED
INTRAVENOUS | Status: AC
  Filled 2020-03-28: qty 1000

## 2020-03-28 MED ORDER — NALBUPHINE HCL 10 MG/ML IJ SOLN: 5.0000 mg | Freq: Once | INTRAMUSCULAR | Status: AC | PRN

## 2020-03-28 MED ORDER — ONDANSETRON HCL 4 MG/2ML IJ SOLN: 4.0000 mg | Freq: Three times a day (TID) | INTRAMUSCULAR | Status: DC | PRN

## 2020-03-28 MED ORDER — ACETAMINOPHEN 500 MG PO TABS
1000.0000 mg | ORAL_TABLET | Freq: Four times a day (QID) | ORAL | Status: DC
  Administered 2020-03-28 – 2020-03-30 (×7): 1000 mg via ORAL
  Filled 2020-03-28 (×7): qty 2

## 2020-03-28 MED ORDER — DEXAMETHASONE SODIUM PHOSPHATE 10 MG/ML IJ SOLN
INTRAMUSCULAR | Status: DC | PRN
  Administered 2020-03-28: 10 mg via INTRAVENOUS

## 2020-03-28 MED ORDER — NALBUPHINE HCL 10 MG/ML IJ SOLN
5.0000 mg | Freq: Once | INTRAMUSCULAR | Status: AC | PRN
  Administered 2020-03-28: 5 mg via INTRAVENOUS

## 2020-03-28 MED ORDER — SIMETHICONE 80 MG PO CHEW: 80.0000 mg | CHEWABLE_TABLET | ORAL | Status: DC | PRN

## 2020-03-28 MED ORDER — DIPHENHYDRAMINE HCL 50 MG/ML IJ SOLN: 12.5000 mg | INTRAMUSCULAR | Status: DC | PRN

## 2020-03-28 MED ORDER — MORPHINE SULFATE (PF) 0.5 MG/ML IJ SOLN
INTRAMUSCULAR | Status: DC | PRN
  Administered 2020-03-28: .15 mg via INTRATHECAL

## 2020-03-28 MED ORDER — FENTANYL CITRATE (PF) 100 MCG/2ML IJ SOLN
INTRAMUSCULAR | Status: DC | PRN
  Administered 2020-03-28: 15 ug via INTRATHECAL

## 2020-03-28 MED ORDER — NALBUPHINE HCL 10 MG/ML IJ SOLN
INTRAMUSCULAR | Status: AC
  Filled 2020-03-28: qty 1

## 2020-03-28 MED ORDER — OXYCODONE HCL 5 MG PO TABS
5.0000 mg | ORAL_TABLET | ORAL | Status: DC | PRN
  Administered 2020-03-29: 5 mg via ORAL
  Administered 2020-03-29 – 2020-03-30 (×3): 10 mg via ORAL
  Filled 2020-03-28: qty 1
  Filled 2020-03-28 (×3): qty 2

## 2020-03-28 MED ORDER — FENTANYL CITRATE (PF) 100 MCG/2ML IJ SOLN: 25.0000 ug | INTRAMUSCULAR | Status: DC | PRN

## 2020-03-28 MED ORDER — NALOXONE HCL 0.4 MG/ML IJ SOLN: 0.4000 mg | INTRAMUSCULAR | Status: DC | PRN

## 2020-03-28 MED ORDER — ENOXAPARIN SODIUM 80 MG/0.8ML ~~LOC~~ SOLN
70.0000 mg | SUBCUTANEOUS | Status: DC
  Administered 2020-03-29: 70 mg via SUBCUTANEOUS
  Filled 2020-03-28: qty 0.8

## 2020-03-28 MED ORDER — SODIUM CHLORIDE 0.9 % IV SOLN: INTRAVENOUS | Status: DC | PRN

## 2020-03-28 MED ORDER — MENTHOL 3 MG MT LOZG
1.0000 | LOZENGE | OROMUCOSAL | Status: DC | PRN
  Administered 2020-03-28 – 2020-03-29 (×2): 3 mg via ORAL
  Filled 2020-03-28 (×2): qty 9

## 2020-03-28 MED ORDER — SIMETHICONE 80 MG PO CHEW
80.0000 mg | CHEWABLE_TABLET | Freq: Three times a day (TID) | ORAL | Status: DC
  Administered 2020-03-28 – 2020-03-30 (×4): 80 mg via ORAL
  Filled 2020-03-28 (×4): qty 1

## 2020-03-28 MED ORDER — PHENYLEPHRINE HCL-NACL 20-0.9 MG/250ML-% IV SOLN
INTRAVENOUS | Status: AC
  Filled 2020-03-28: qty 250

## 2020-03-28 MED ORDER — ZOLPIDEM TARTRATE 5 MG PO TABS: 5.0000 mg | ORAL_TABLET | Freq: Every evening | ORAL | Status: DC | PRN

## 2020-03-28 MED ORDER — ONDANSETRON HCL 4 MG/2ML IJ SOLN
INTRAMUSCULAR | Status: DC | PRN
  Administered 2020-03-28: 4 mg via INTRAVENOUS

## 2020-03-28 MED ORDER — DIBUCAINE (PERIANAL) 1 % EX OINT
1.0000 "application " | TOPICAL_OINTMENT | CUTANEOUS | Status: DC | PRN
  Administered 2020-03-29: 1 via RECTAL
  Filled 2020-03-28: qty 28

## 2020-03-28 MED ORDER — FENTANYL CITRATE (PF) 100 MCG/2ML IJ SOLN
INTRAMUSCULAR | Status: AC
  Filled 2020-03-28: qty 2

## 2020-03-28 MED ORDER — COCONUT OIL OIL: 1.0000 "application " | TOPICAL_OIL | Status: DC | PRN

## 2020-03-28 SURGICAL SUPPLY — 43 items
ADH SKN CLS APL DERMABOND .7 (GAUZE/BANDAGES/DRESSINGS) ×1
CHLORAPREP W/TINT 26ML (MISCELLANEOUS) ×2 IMPLANT
CLAMP CORD UMBIL (MISCELLANEOUS) IMPLANT
CLOTH BEACON ORANGE TIMEOUT ST (SAFETY) ×2 IMPLANT
DERMABOND ADVANCED (GAUZE/BANDAGES/DRESSINGS) ×1
DERMABOND ADVANCED .7 DNX12 (GAUZE/BANDAGES/DRESSINGS) ×1 IMPLANT
DRESSING PREVENA PLUS CUSTOM (GAUZE/BANDAGES/DRESSINGS) IMPLANT
DRSG OPSITE POSTOP 4X10 (GAUZE/BANDAGES/DRESSINGS) ×2 IMPLANT
DRSG PREVENA PLUS CUSTOM (GAUZE/BANDAGES/DRESSINGS) ×2
ELECT REM PT RETURN 9FT ADLT (ELECTROSURGICAL) ×2
ELECTRODE REM PT RTRN 9FT ADLT (ELECTROSURGICAL) ×1 IMPLANT
EXTRACTOR VACUUM BELL STYLE (SUCTIONS) IMPLANT
GAUZE SPONGE 4X4 12PLY STRL LF (GAUZE/BANDAGES/DRESSINGS) IMPLANT
GLOVE BIO SURGEON STRL SZ 6 (GLOVE) ×2 IMPLANT
GLOVE BIOGEL PI IND STRL 6.5 (GLOVE) ×1 IMPLANT
GLOVE BIOGEL PI IND STRL 7.0 (GLOVE) ×2 IMPLANT
GLOVE BIOGEL PI INDICATOR 6.5 (GLOVE) ×1
GLOVE BIOGEL PI INDICATOR 7.0 (GLOVE) ×2
GOWN STRL REUS W/TWL LRG LVL3 (GOWN DISPOSABLE) ×4 IMPLANT
HEMOSTAT ARISTA ABSORB 3G PWDR (HEMOSTASIS) ×1 IMPLANT
KIT ABG SYR 3ML LUER SLIP (SYRINGE) IMPLANT
NDL HYPO 25X5/8 SAFETYGLIDE (NEEDLE) IMPLANT
NEEDLE HYPO 25X5/8 SAFETYGLIDE (NEEDLE) IMPLANT
NS IRRIG 1000ML POUR BTL (IV SOLUTION) ×2 IMPLANT
PACK C SECTION WH (CUSTOM PROCEDURE TRAY) ×2 IMPLANT
PAD ABD 8X10 STRL (GAUZE/BANDAGES/DRESSINGS) IMPLANT
PAD OB MATERNITY 4.3X12.25 (PERSONAL CARE ITEMS) ×2 IMPLANT
PENCIL SMOKE EVAC W/HOLSTER (ELECTROSURGICAL) ×2 IMPLANT
RETAINER VISCERAL (MISCELLANEOUS) ×1 IMPLANT
RTRCTR C-SECT PINK 25CM LRG (MISCELLANEOUS) IMPLANT
SUT PDS AB 0 CTX 36 PDP370T (SUTURE) ×2 IMPLANT
SUT PLAIN 2 0 (SUTURE)
SUT PLAIN 2 0 XLH (SUTURE) ×2 IMPLANT
SUT PLAIN ABS 2-0 CT1 27XMFL (SUTURE) IMPLANT
SUT VIC AB 0 CT1 36 (SUTURE) ×4 IMPLANT
SUT VIC AB 2-0 CT1 27 (SUTURE) ×2
SUT VIC AB 2-0 CT1 TAPERPNT 27 (SUTURE) ×1 IMPLANT
SUT VIC AB 3-0 SH 27 (SUTURE)
SUT VIC AB 3-0 SH 27X BRD (SUTURE) IMPLANT
SUT VIC AB 4-0 KS 27 (SUTURE) ×2 IMPLANT
TOWEL OR 17X24 6PK STRL BLUE (TOWEL DISPOSABLE) ×2 IMPLANT
TRAY FOLEY W/BAG SLVR 14FR LF (SET/KITS/TRAYS/PACK) ×2 IMPLANT
WATER STERILE IRR 1000ML POUR (IV SOLUTION) ×2 IMPLANT

## 2020-03-28 NOTE — Op Note (Signed)
C-Section Operative Note  Date: 03/28/20  Preoperative Diagnosis: IUP @ 37 1/7; h/o csx x1 with complete placenta previa Postoperative Diagnosis: Same as above Procedure: ERLTCS Surgeon: Eula Flax, MD Assistants: Maida Sale RNFA; Janace Aris CST Findings: Viable female infant weighing 6lbs with APGARS of 8 and 9 at 1 and 5 minutes, respectively. Normal appearing uterus, bilateral fallopian tubes and ovaries. Significant adhesions noted between peritoneum, rectus bellies and fascia Specimens: Placenta to pathology EBL 484cc IVF 3600cc UOP 500cc  Patient Course: Patient w/ h/o csx x1 @ 36wks for NRFHT. Diagnosed with complete placenta previa that persisted to 36wks. Consented for ERLTCS at 37 weeks.  Consent:  R/B/A of cesarean section discussed with patient. Alternative would be vaginal delivery which would mean shorter postpartum stay and decreased risk of bleeding. Risks of section include infection of the uterus, pelvic organs, or skin, inadvertent injury to internal organs, such as bowel or bladder. If there is major injury, extensive surgery may be required. If injury is minor, it may be treated with relative ease. Discussed possibility of excessive blood loss and transfusion. If bleeding cannot be controlled using medical or minor surgical methods, a cesarean hysterectomy may be performed which would mean no future fertility. Patient accepts the possibility of blood transfusion, if necessary. Patient understands and agrees to move forward with section.   Operative Procedure: Given patient BMI, 3g Ancef was given preoperatively.Patient was taken to the operating room where epidural anesthesia was found to be adequate by Allis clamp test. She was prepped and draped in the normal sterile fashion in the dorsal supine position with a leftward tilt. A TRAXI was used to elevated abdomen so that prior Joel-Cohen incision could be easily visualized. Patient's prior weight loss created a  long but thin pannus that distorted suprapubic anatomy. An appropriate time out was performed. A Joel-Cohen skin incision was then made with the scalpel and carried through to the underlying layer of fascia by sharp dissection and Bovie cautery. The fascia was nicked in the midline and the incision was extended laterally with Mayo scissors. The superior aspect of the incision was grasped Coker clamps in an attempt to dissect off of rectus bellies. However, minimal dissection was carried out given significant adhesive burden as mentioned in findings. Scant rectus fibers dissected off superior aspect of rectus fascia, abdominal cavity entry noted at this point. The Alexis self-retaining wound retractor  then placed within the incision and the lower uterine segment exposed. The bladder flap was developed with Metzenbaum scissors and pushed away from the lower uterine segment. The lower uterine segment was then incised in a low transverse fashion and the cavity itself entered bluntly. The incision was extended bluntly. Amniotic sac was ruptured and fluid was noted to be clear in color. The infant's head was then lifted and delivered from the incision without difficulty using the standard movements. The remainder of the infant delivered and the nose and mouth bulb suctioned with the cord clamped and cut as well. The infant was handed off to NICU. The placenta was then spontaneously expressed from the uterus and the uterus cleared of all clots and debris with moist lap sponge. The uterine incision was then repaired in 2 layers the first layer was a running locked layer of 0-vicryl and the second an imbricating layer of the same suture. Two additional figure of eight stiches placed for excellent hemostasis. Arista placed over hysterotomy, excellent hemostasis noted again. During hysterotomy repair, redundant bowel noted which was packed away using moist  lap sponges x3. The tubes and ovaries were inspected and the gutters  cleared of all clots and debris. The uterine incision was inspected and found to be hemostatic. All instruments and sponges as well as the Alexis retractor were then removed from the abdomen. The fascia was then closed with 0-PDS x2, beginning at bilateral apices and meeting at midline and secured. Subcutaneous tissue was reapproximated with 3-0 plain in a running fashion in two layers followed by 2-0 vicryl in one layer given significant subcutaneous fat. The skin was closed with a subcuticular stitch of 4-0 Vicryl on a Keith needle and then reinforced with a Provena vacuum dressing. At the conclusion of the procedure all instruments and sponge counts were correct. Patient was taken to the recovery room in good condition with her baby accompanying her skin to skin.  Please note - significant time was added to procedure given patient's BMI in combination with adhesive burden mentioned earlier.

## 2020-03-28 NOTE — H&P (Addendum)
Lynn Roberts is a 34 y.o. female presenting for scheduled ERLTCS.  PNC c/b complete placenta previa; h/o csx x1 (@ 36wks, NRFHT), BMI 36 (s/p duodenal switch and significant weight loss), + tobacco use, anemia (s/p IV feraheme x2, on PO), h/o PAT (s/p ablation and cardiac workup in pregnancy s/f isolated PVCs; fetal echo WNL)  Of note, pt is [place] w/ increased r/o malignant hyperthermia with isoflurane induction. Anesthesia aware well in advance, OR set up appropriately.   OB History    Gravida  3   Para  1   Term      Preterm  1   AB  1   Living        SAB  1   TAB      Ectopic      Multiple  0   Live Births             Past Medical History:  Diagnosis Date  . Arthritis of low back   . Complication of anesthesia    is of [name] descent  . Family history of adverse reaction to anesthesia    pt is a [name]  . GERD (gastroesophageal reflux disease)   . History of PAT (paroxysmal atrial tachycardia)    had cardiac ablation as a teenager  . Migraines   . Miscarriage 04/16/2016  . Obesity   . Papilloma of breast 10/2014   left  . PCO (polycystic ovaries) 07/14/2017  . PCOD (polycystic ovarian disease)    no menses  . Supervision of normal pregnancy 04/09/2016    [facility]iated Care at   7+1 week FOB  Charlann Noss Dating By  [place]  Pap  04/09/16 GC/CT Initial:                36+wks: Genetic Screen NT/IT:  CF screen  Anatomic [place]  Flu vaccine  Tdap Recommended ~ 28wks Glucose Screen  2 hr GBS  Feed Preference  Contraception  Circumcision  Childbirth Classes  Pediatrician     Past Surgical History:  Procedure Laterality Date  . BREAST LUMPECTOMY Left 2011   papilloma  . BREAST LUMPECTOMY WITH RADIOACTIVE SEED LOCALIZATION Left 10/18/2014   Procedure:  RADIOACTIVE SEED GUIDED EXCISIONAL BREAST BIOPSY;  Surgeon: Alphonsa Overall, MD;  Location: [facility];  Service: General;  Laterality: Left;  . BREAST SURGERY Right 07/27/2004    retro-areolar dissection with exc. of large duct  . CARDIAC ELECTROPHYSIOLOGY STUDY AND ABLATION  age 68  . CHOLECYSTECTOMY  04/27/2008  . GASTROPLASTY DUODENAL SWITCH  01/2014  . OVARIAN CYST REMOVAL Bilateral   . TOENAIL EXCISION    . UNILATERAL SALPINGECTOMY     Family History: family history includes Cancer in her maternal grandmother and paternal grandfather; Diabetes in her maternal grandfather and mother; Heart disease in her maternal grandmother; Hyperlipidemia in her father; Hypertension in her father and mother. Social History:  reports that she has been smoking cigarettes. She has a 5.00 pack-year smoking history. She has never used smokeless tobacco. She reports that she does not drink alcohol or use drugs.     Maternal Diabetes: No 1hr 66 Genetic Screening: Normal [place] WNL Maternal Ultrasounds/Referrals: Other: complete echo Fetal Ultrasounds or other Referrals:  Fetal echo WNL Maternal Substance Abuse:  Yes:  Type: Smoker <1/2 ppd Significant Maternal Medications:  None Significant Maternal Lab Results:  Group B Strep positive Other Comments:  None  Review of Systems  Constitutional: Negative for chills and  fever.  Respiratory: Negative for shortness of breath.   Cardiovascular: Negative for chest pain, palpitations and leg swelling.  Gastrointestinal: Negative for abdominal pain and vomiting.  Neurological: Negative for dizziness, weakness and headaches.  Psychiatric/Behavioral: Negative for suicidal ideas.   History   unknown if currently breastfeeding. Exam Physical Exam  Constitutional: She is oriented to person, place, and time. She appears well-developed and well-nourished. No distress.  HENT:  Head: Normocephalic and atraumatic.  Eyes: Pupils are equal, round, and reactive to light.  Cardiovascular: Normal rate and regular rhythm. Exam reveals no gallop.  No murmur heard. GI: There is no abdominal tenderness. There is no rebound and no guarding.   Genitourinary:    Vagina and uterus normal.   Musculoskeletal:        General: Normal range of motion.     Cervical back: Normal range of motion and neck supple.  Neurological: She is alert and oriented to person, place, and time.  Skin: Skin is warm and dry.    Prenatal labs: ABO, Rh: --/--/B POS, B POS Performed at [facility], [place] [address] [place], [place] [address]  [id number]) Antibody: NEG (04/25 0853) Rubella: Immune (10/06 0000) RPR: NON REACTIVE (04/25 0853)  HBsAg: Negative (10/06 0000)  HIV: Non-reactive (10/06 0000)  GBS:   POS  Assessment/Plan: This is a 34yo [id number] @ 62 1/7 by 8wk TVUS NOT c/w LMP presenting for ERLTCS for complete placenta previa with h/o csx x1. PNC noted as above in HPI. Baby girl, GBS pos  Admit to L&D. 3g Ancef for preop given BMI, pt cannot tolerate NSAIDS or sulfa (allergy plus bariatric surgery). R/B/A of cesarean section discussed with patient. Alternative would be vaginal delivery which would mean shorter postpartum stay and decreased risk of bleeding. Risks of section include infection of the uterus, pelvic organs, or skin, inadvertent injury to internal organs, such as bowel or bladder. If there is major injury, extensive surgery may be required. If injury is minor, it may be treated with relative ease. Discussed possibility of excessive blood loss and transfusion. If bleeding cannot be controlled using medical or minor surgical methods, a cesarean hysterectomy may be performed which would mean no future fertility. Patient accepts the possibility of blood transfusion, if necessary. Patient understands and agrees to move forward with section.  [place] 03/28/2020, 6:09 AM  ADDENDUM: BSUS confirms vertex

## 2020-03-28 NOTE — Transfer of Care (Signed)
Immediate Anesthesia Transfer of Care Note  Patient: Lynn Roberts  Procedure(s) Performed: CESAREAN SECTION (N/A )  Patient Location: PACU  Anesthesia Type:Spinal  Level of Consciousness: awake, alert  and oriented  Airway & Oxygen Therapy: Patient Spontanous Breathing  Post-op Assessment: Report given to RN and Post -op Vital signs reviewed and stable  Post vital signs: Reviewed and stable  Last Vitals:  Vitals Value Taken Time  BP 119/72 03/28/20 1430  Temp    Pulse    Resp 16 03/28/20 1432  SpO2    Vitals shown include unvalidated device data.  Last Pain:  Vitals:   03/28/20 1025  TempSrc: Oral         Complications: No apparent anesthesia complications

## 2020-03-28 NOTE — Lactation Note (Signed)
This note was copied from a baby's chart. Lactation Consultation Note  Patient Name: Lynn Roberts [id number] Date: 03/28/2020 Reason for consult: Initial assessment;Early term 37-38.6wks;Infant < 6lbs;Maternal endocrine disorder Type of Endocrine Disorder?: PCOS  LC in to visit with P2 Mom of ET infant weighing 5 lbs 15.9 oz.  36 5 hrs old and RN already set Mom up double pumping.  Mom has a history of low milk supply with her 36 yr old.  First baby was born with known GI problem requiring surgery.  Baby was in NICU for 18 days and Mom states she pumped regularly with a hospital grade DEBP and maximum milk expressed was 5 ml per pumping.    Mom desires to try again, but desires to pump and bottle feed currently.  Mom denies any breast changes with pregnancy.   Encouraged STS with baby as much as possible.  Breast massage and hand expression recommended. Encouraged double pumping every 2-3 hrs during the day and 3-4 hrs at night.  Mom to ask for help prn.    Mom states she has a pump at home.  Lactation brochure left in room.  Mom aware of IP and OP lactation support available to her.  Encouraged her to call prn.  Interventions Interventions: Breast feeding basics reviewed;Skin to skin;Breast massage;Hand express;DEBP  Lactation Tools Discussed/Used Tools: Pump;Bottle Breast pump type: Double-Electric Breast Pump Pump Review: Setup, frequency, and cleaning;Milk Storage Initiated by:: MBU RN Date initiated:: 03/28/20   Consult Status Consult Status: Follow-up Date: 03/29/20 Follow-up type: In-patient    Broadus John 03/28/2020, 6:20 PM

## 2020-03-28 NOTE — Anesthesia Procedure Notes (Signed)
Spinal  Patient location during procedure: OR Start time: 03/28/2020 12:38 PM End time: 03/28/2020 12:48 PM Staffing Performed: anesthesiologist  Anesthesiologist: Freddrick March, MD Preanesthetic Checklist Completed: patient identified, IV checked, risks and benefits discussed, surgical consent, monitors and equipment checked, pre-op evaluation and timeout performed Spinal Block Patient position: sitting Prep: DuraPrep and site prepped and draped Patient monitoring: cardiac monitor, continuous pulse ox and blood pressure Approach: midline Location: L3-4 Injection technique: single-shot Needle Needle type: Pencan  Needle gauge: 24 G Needle length: 9 cm Assessment Sensory level: T6 Additional Notes Functioning IV was confirmed and monitors were applied. Sterile prep and drape, including hand hygiene and sterile gloves were used. The patient was positioned and the spine was prepped. The skin was anesthetized with lidocaine.  Free flow of clear CSF was obtained prior to injecting local anesthetic into the CSF.  The spinal needle aspirated freely following injection.  The needle was carefully withdrawn.  The patient tolerated the procedure well.

## 2020-03-28 NOTE — Progress Notes (Signed)
Subjective: Postpartum Day 0: Cesarean Delivery Patient reports doing well.    Objective: Vital signs in last 24 hours: Temp:  [97.4 F (36.3 C)-98.4 F (36.9 C)] 98.4 F (36.9 C) (04/27 1645) Pulse Rate:  [54-72] 58 (04/27 1645) Resp:  [9-20] 15 (04/27 1645) BP: (112-145)/(63-85) 117/64 (04/27 1645) SpO2:  [97 %-100 %] 99 % (04/27 1645) Weight:  [142.9 kg] 142.9 kg (04/27 1025)  Physical Exam:  General: alert and no distress Lochia: appropriate Uterine Fundus: firm Incision: healing well DVT Evaluation: No evidence of DVT seen on physical exam.  Recent Labs    03/26/20 0853  HGB 10.0*  HCT 34.6*    Assessment/Plan: Status post Cesarean section. Doing well postoperatively.  Continue current care.  Cher Egnor Bovard-Stuckert 03/28/2020, 5:53 PM

## 2020-03-28 NOTE — Anesthesia Preprocedure Evaluation (Addendum)
Anesthesia Evaluation  Patient identified by MRN, date of birth, ID band Patient awake    Reviewed: Allergy & Precautions, NPO status , Patient's Chart, lab work & pertinent test results  Airway Mallampati: III  TM Distance: >3 FB Neck ROM: Full    Dental no notable dental hx. (+) Teeth Intact, Dental Advisory Given   Pulmonary sleep apnea , Current SmokerPatient did not abstain from smoking.,    Pulmonary exam normal breath sounds clear to auscultation       Cardiovascular negative cardio ROS Normal cardiovascular exam Rhythm:Regular Rate:Normal     Neuro/Psych  Headaches, negative psych ROS   GI/Hepatic Neg liver ROS, GERD  Medicated and Controlled,  Endo/Other  Morbid obesity (BMI 44)PCOS  Renal/GU negative Renal ROS  negative genitourinary   Musculoskeletal  (+) Arthritis ,   Abdominal   Peds  Hematology  (+) Blood dyscrasia (Hgb 10.0), anemia ,   Anesthesia Other Findings complete placenta previa; h/o C/Sx1  Reproductive/Obstetrics (+) Pregnancy                            Anesthesia Physical Anesthesia Plan  ASA: III  Anesthesia Plan: Spinal   Post-op Pain Management:    Induction:   PONV Risk Score and Plan: Treatment may vary due to age or medical condition  Airway Management Planned: Natural Airway  Additional Equipment:   Intra-op Plan:   Post-operative Plan:   Informed Consent: I have reviewed the patients History and Physical, chart, labs and discussed the procedure including the risks, benefits and alternatives for the proposed anesthesia with the patient or authorized representative who has indicated his/her understanding and acceptance.     Dental advisory given  Plan Discussed with: CRNA  Anesthesia Plan Comments:         Anesthesia Quick Evaluation

## 2020-03-28 NOTE — Anesthesia Postprocedure Evaluation (Signed)
Anesthesia Post Note  Patient: Lynn Roberts  Procedure(s) Performed: CESAREAN SECTION (N/A )     Patient location during evaluation: PACU Anesthesia Type: Spinal Level of consciousness: oriented and awake and alert Pain management: pain level controlled Vital Signs Assessment: post-procedure vital signs reviewed and stable Respiratory status: spontaneous breathing, respiratory function stable and patient connected to nasal cannula oxygen Cardiovascular status: blood pressure returned to baseline and stable Postop Assessment: no headache, no backache and no apparent nausea or vomiting Anesthetic complications: no    Last Vitals:  Vitals:   03/28/20 1518 03/28/20 1519  BP:    Pulse: (!) 54 (!) 57  Resp: 15 14  Temp:    SpO2: 100% 98%    Last Pain:  Vitals:   03/28/20 1025  TempSrc: Oral   Pain Goal:    LLE Motor Response: Purposeful movement (03/28/20 1515)   RLE Motor Response: Purposeful movement (03/28/20 1515)       Epidural/Spinal Function Cutaneous sensation: Able to Discern Pressure (03/28/20 1515), Patient able to flex knees: Yes (03/28/20 1515), Patient able to lift hips off bed: No (03/28/20 1515), Back pain beyond tenderness at insertion site: No (03/28/20 1515), Progressively worsening motor and/or sensory loss: No (03/28/20 1515), Bowel and/or bladder incontinence post epidural: No (03/28/20 1515)  Tarrie Mcmichen L Ailynn Gow

## 2020-03-29 ENCOUNTER — Encounter: Payer: Self-pay | Admitting: *Deleted

## 2020-03-29 LAB — CREATININE, SERUM
Creatinine, Ser: 0.59 mg/dL (ref 0.44–1.00)
GFR calc Af Amer: >60 mL/min (ref >60)
GFR calc non Af Amer: >60 mL/min (ref >60)

## 2020-03-29 LAB — CBC
HCT: 31 % — ABNORMAL LOW (ref 36.0–46.0)
Hemoglobin: 8.9 g/dL — ABNORMAL LOW (ref 12.0–15.0)
MCH: 25.1 pg — ABNORMAL LOW (ref 26.0–34.0)
MCHC: 28.7 g/dL — ABNORMAL LOW (ref 30.0–36.0)
MCV: 87.3 fL (ref 80.0–100.0)
Platelets: 161 10*3/uL (ref 150–400)
RBC: 3.55 MIL/uL — ABNORMAL LOW (ref 3.87–5.11)
RDW: 25.2 % — ABNORMAL HIGH (ref 11.5–15.5)
WBC: 13.3 10*3/uL — ABNORMAL HIGH (ref 4.0–10.5)
nRBC: 0 % (ref 0.0–0.2)

## 2020-03-29 MED ORDER — PANTOPRAZOLE SODIUM 40 MG PO TBEC
40.0000 mg | DELAYED_RELEASE_TABLET | Freq: Every day | ORAL | Status: DC
  Administered 2020-03-29 – 2020-03-30 (×2): 40 mg via ORAL
  Filled 2020-03-29 (×2): qty 1

## 2020-03-29 MED ORDER — FUROSEMIDE 40 MG PO TABS
40.0000 mg | ORAL_TABLET | Freq: Two times a day (BID) | ORAL | Status: AC
  Administered 2020-03-29 (×2): 40 mg via ORAL
  Filled 2020-03-29 (×2): qty 1

## 2020-03-29 NOTE — Progress Notes (Signed)
Subjective: Postpartum Day 1 Cesarean Delivery Patient reports tolerating PO, + flatus and no problems voiding.    Objective: Vital signs in last 24 hours: Temp:  [97.4 F (36.3 C)-98.7 F (37.1 C)] 98.6 F (37 C) (04/28 0530) Pulse Rate:  [54-74] 55 (04/28 0530) Resp:  [9-20] 16 (04/28 0530) BP: (112-145)/(63-85) 120/69 (04/28 0530) SpO2:  [97 %-100 %] 100 % (04/28 0530) Weight:  [142.9 kg] 142.9 kg (04/27 1025)  Physical Exam:  General: alert and cooperative Lochia: appropriate Uterine Fundus: firm Incision: vacuum dressing intact LE with pitting edema up to knee  Recent Labs    03/29/20 0339  HGB 8.9*  HCT 31.0*    Assessment/Plan: Status post Cesarean section. Doing well postoperatively.   Tolerating her mild anemia well. Requesting protonix and abdominal binder (ordered, but told pt binder may not work until after dressing off in one week Pitting edema noted and pt bottlefeeding, will give a couple doses of lasix today  Logan Bores 03/29/2020, 9:54 AM

## 2020-03-30 LAB — SURGICAL PATHOLOGY

## 2020-03-30 MED ORDER — OXYCODONE HCL 5 MG PO TABS: 5.0000 mg | ORAL_TABLET | ORAL | 0 refills | Status: DC | PRN

## 2020-03-30 NOTE — Discharge Instructions (Signed)
As per discharge pamphlet °

## 2020-03-30 NOTE — Progress Notes (Signed)
POD #2 LTCS Doing well, still some edema, wants to go home today Afeb, VSS Abd- soft, fundus firm, neg pressure dressing intact D/c home today

## 2020-03-30 NOTE — Discharge Summary (Signed)
OB Discharge Summary     Patient Name: Lynn Roberts DOB: Feb 11, 1986 MRN: [id number]  Date of admission: 03/28/2020 Delivering MD: Eula Flax M   Date of discharge: 03/30/2020  Admitting diagnosis: Placenta previa before labor and cesarean delivery without hemorrhage [O44.00] Intrauterine pregnancy: [redacted]w[redacted]d     Secondary diagnosis:  Active Problems:   Placenta previa before labor and cesarean delivery without hemorrhage      Discharge diagnosis: Term Pregnancy Delivered and placenta previa                                                                                                Hospital course:  Sceduled C/S   34 y.o. yo G3P0110 at [redacted]w[redacted]d was admitted to the hospital 03/28/2020 for scheduled cesarean section with the following indication:Previa.  Membrane Rupture Time/Date: 1:16 PM ,03/28/2020   Patient delivered a Viable infant.03/28/2020  Details of operation can be found in separate operative note.  Pateint had an uncomplicated postpartum course.  She is ambulating, tolerating a regular diet, passing flatus, and urinating well. Patient is discharged home in stable condition on  03/30/20         Physical exam  Vitals:   03/29/20 0915 03/29/20 1430 03/29/20 2145 03/30/20 0537  BP: 114/68 112/80 117/76 130/81  Pulse: 66 62 79 71  Resp: 18 18 16 17   Temp: 99 F (37.2 C) 98.2 F (36.8 C) 97.7 F (36.5 C) 97.6 F (36.4 C)  TempSrc: Oral Oral Oral Oral  SpO2: 100%  100%   Weight:      Height:       General: alert Lochia: appropriate Uterine Fundus: firm Incision: Dressing is clean, dry, and intact  Labs: Lab Results  Component Value Date   WBC 13.3 (H) 03/29/2020   HGB 8.9 (L) 03/29/2020   HCT 31.0 (L) 03/29/2020   MCV 87.3 03/29/2020   PLT 161 03/29/2020   CMP Latest Ref Rng & Units 03/29/2020  Glucose 70 - 99 mg/dL -  BUN 6 - 20 mg/dL -  Creatinine 0.44 - 1.00 mg/dL 0.59  Sodium 135 - 145 mmol/L -  Potassium 3.5 - 5.1 mmol/L -  Chloride 98 - 111  mmol/L -  CO2 22 - 32 mmol/L -  Calcium 8.9 - 10.3 mg/dL -  Total Protein 6.5 - 8.1 g/dL -  Total Bilirubin 0.3 - 1.2 mg/dL -  Alkaline Phos 38 - 126 U/L -  AST 15 - 41 U/L -  ALT 0 - 44 U/L -    Discharge instruction: per After Visit Summary and "Baby and Me Booklet".  After visit meds:  Allergies as of 03/30/2020      Reactions   Nsaids Other (See Comments)   HX. OF WEIGHT-LOSS SURGERY HX. OF WEIGHT-LOSS SURGERY   Other Rash   LOTIONS    Sulfa Antibiotics Rash   Tramadol Rash      Medication List    STOP taking these medications   aspirin 81 MG EC tablet   famotidine 20 MG tablet Commonly known as: PEPCID     TAKE these medications  cholecalciferol 25 MCG (1000 UNIT) tablet Commonly known as: VITAMIN D3 Take 1,000 Units by mouth 2 (two) times daily.   docusate sodium 100 MG capsule Commonly known as: COLACE Take 100 mg by mouth 2 (two) times daily.   Ferrex 150 150 MG capsule Generic drug: iron polysaccharides Take 150 mg by mouth daily.   oxyCODONE 5 MG immediate release tablet Commonly known as: Oxy IR/ROXICODONE Take 1 tablet (5 mg total) by mouth every 4 (four) hours as needed for severe pain.   Prenate Mini 18-0.6-0.4-350 MG Caps Take 1 capsule by mouth daily.       Diet: routine diet  Activity: Advance as tolerated. Pelvic rest for 6 weeks.   Outpatient follow up:1 week Follow up Appt: Future Appointments  Date Time Provider [place]  06/07/2020 11:00 AM [facility] None  06/07/2020 11:30 AM Orson Slick, MD [facility] None    Newborn Data: Live born female  Birth Weight: 5 lb 15.9 oz (2720 g) APGAR: 8, 9  Newborn Delivery   Birth date/time: 03/28/2020 13:17:00 Delivery type: C-Section, Low Transverse Trial of labor: No C-section categorization: Repeat      Baby Feeding: Bottle Disposition:home with mother   03/30/2020 Clarene Duke, MD

## 2020-03-30 NOTE — Lactation Note (Signed)
This note was copied from a baby's chart. Lactation Consultation Note  Patient Name: Girl Horace Catherine [id number] Date: 03/30/2020 Reason for consult: Follow-up assessment  LC Follow Up Visit:  Researched mother's medication, Lasix, and found it to be an L-3 per Rodell Perna.  It is probably safe and compatible and the excerpt from the book stated that it is used with neonates.  Passed this information on to mother and provided the photocopy of the recommendation.  Informed her that she may share this with her OB.  Mother appreciative.   Maternal Data    Feeding Feeding Type: Bottle Fed - Formula Nipple Type: Slow - flow  LATCH Score                   Interventions    Lactation Tools Discussed/Used     Consult Status Consult Status: Complete Date: 03/30/20 Follow-up type: Call as needed    Deion Swift R Briante Loveall 03/30/2020, 8:55 AM

## 2020-03-30 NOTE — Lactation Note (Signed)
This note was copied from a baby's chart. Lactation Consultation Note  Patient Name: Lynn Roberts [id number] Date: 03/30/2020 Reason for consult: Follow-up assessment  P2 mother whose infant is now 71 hours old.  This is an ETI at 37+1 weeks weighing < 6 lbs and a 7% weight loss this morning.  Mother has a history of PCOS and was not been able to produce a full milk supply with her first child.  Mother's feeding preference is breast/bottle.    Baby was quiet and awake in mother's arms when I arrived.  Mother informed me that she has had a few doses of Lasix and was told not to pump and bottle feed her breast milk to baby at this time.  She stopped pumping when she started taking Lasix.  I suggested she begin pumping now and continue to pump every three hours to stimulate breasts for milk production.  I asked her to keep the milk at bedside (if she obtains any) until I can follow through with the safety of providing breast milk during Lasix administration.  Mother verbalized understanding.  Mother has been using Similac 22 calorie formula and baby has been taking adequate volume.  Suggested mother she begin feeding at least 30 mls now and more if baby desires.  She is using the white nipple without difficulty.    Mother has a DEBP for home use.  She does not desire a manual pump.  Mother has our OP phone number for questions/concerns after discharge.  Father present.     Maternal Data    Feeding Feeding Type: Bottle Fed - Formula Nipple Type: Slow - flow  LATCH Score                   Interventions    Lactation Tools Discussed/Used     Consult Status Consult Status: Complete Date: 03/30/20 Follow-up type: Call as needed    Lanice Schwab Laureano Hetzer 03/30/2020, 8:38 AM

## 2020-04-11 ENCOUNTER — Inpatient Hospital Stay (HOSPITAL_COMMUNITY): Admit: 2020-04-11 | Payer: 59

## 2020-05-08 DIAGNOSIS — Z1389 Encounter for screening for other disorder: Secondary | ICD-10-CM | POA: Diagnosis not present

## 2020-05-08 DIAGNOSIS — Z3009 Encounter for other general counseling and advice on contraception: Secondary | ICD-10-CM | POA: Diagnosis not present

## 2020-06-07 ENCOUNTER — Inpatient Hospital Stay: Payer: Medicaid Other | Attending: Hematology and Oncology | Admitting: Hematology and Oncology

## 2020-06-07 ENCOUNTER — Inpatient Hospital Stay: Payer: Medicaid Other

## 2020-06-07 ENCOUNTER — Other Ambulatory Visit: Payer: Self-pay | Admitting: Hematology and Oncology

## 2020-06-07 DIAGNOSIS — D500 Iron deficiency anemia secondary to blood loss (chronic): Secondary | ICD-10-CM

## 2020-06-18 NOTE — Progress Notes (Signed)
Cardiology Office Note:   Date:  06/19/2020  NAME:  Lynn Roberts    MRN: [id number] DOB:  1986/06/10   PCP:  Patient, No Pcp Per  Cardiologist:  Evalina Field, MD   Referring MD: No ref. provider found   Chief Complaint  Patient presents with  . Follow-up   History of Present Illness:   Lynn Roberts is a 34 y.o. female with a hx of atrial tachycardia s/p ablation, tobacco abuse who presents for follow-up. I saw her last year for palpitations in pregnancy. No arrhythmia detected on monitor. Normal echo. She was seen in our office for atypical CP and no further work-up was recommended. She presents for follow-up.  She reports her chest pain has gone away.  She had no further episodes.  She was evaluated by one of our physician assistant for atypical chest pain.  No work-up was recommended.  Clearly this was just pregnancy related.  She is also had no further episodes of palpitations.  Her monitor was normal.  Her echo was normal.  Everything seems to be looking good.  I recommend no further cardiac work-up.  Past Medical History: Past Medical History:  Diagnosis Date  . Arthritis of low back   . Complication of anesthesia    is of [name] descent  . Family history of adverse reaction to anesthesia    pt is a Lumbi [name]  . GERD (gastroesophageal reflux disease)   . History of PAT (paroxysmal atrial tachycardia)    had cardiac ablation as a teenager  . Migraines   . Miscarriage 04/16/2016  . Obesity   . Papilloma of breast 10/2014   left  . PCO (polycystic ovaries) 07/14/2017  . PCOD (polycystic ovarian disease)    no menses  . Supervision of normal pregnancy 04/09/2016    [facility] at   7+1 week FOB  Charlann Noss Dating By  [place]  Pap  04/09/16 GC/CT Initial:                36+wks: Genetic Screen NT/IT:  CF screen  Anatomic [place]  Flu vaccine  Tdap Recommended ~ 28wks Glucose Screen  2 hr GBS  Feed Preference  Contraception  Circumcision  Childbirth Classes   Pediatrician      Past Surgical History: Past Surgical History:  Procedure Laterality Date  . BREAST LUMPECTOMY Left 2011   papilloma  . BREAST LUMPECTOMY WITH RADIOACTIVE SEED LOCALIZATION Left 10/18/2014   Procedure:  RADIOACTIVE SEED GUIDED EXCISIONAL BREAST BIOPSY;  Surgeon: Alphonsa Overall, MD;  Location: [facility];  Service: General;  Laterality: Left;  . BREAST SURGERY Right 07/27/2004   retro-areolar dissection with exc. of large duct  . CARDIAC ELECTROPHYSIOLOGY STUDY AND ABLATION  age 56  . CESAREAN SECTION N/A 03/28/2020   Procedure: CESAREAN SECTION;  Surgeon: Deliah Boston, MD;  [facility];  Service: Obstetrics;  Laterality: N/A;  RNFA Tracey  . CHOLECYSTECTOMY  04/27/2008  . GASTROPLASTY DUODENAL SWITCH  01/2014  . OVARIAN CYST REMOVAL Bilateral   . TOENAIL EXCISION    . UNILATERAL SALPINGECTOMY      Current Medications: No outpatient medications have been marked as taking for the 06/19/20 encounter (Office Visit) with O'Neal, Cassie Freer, MD.     Allergies:    Nsaids, Other, Sulfa antibiotics, and Tramadol   Social History: Social History   Socioeconomic History  . Marital status: Married    Spouse name: Not on file  .  Number of children: 1  . Years of education: Not on file  . Highest education level: Not on file  Occupational History  . Not on file  Tobacco Use  . Smoking status: Current Every Day Smoker    Packs/day: 0.50    Years: 10.00    Pack years: 5.00    Types: Cigarettes  . Smokeless tobacco: Never Used  Vaping Use  . Vaping Use: Never used  Substance and Sexual Activity  . Alcohol use: No    Comment: occ.  . Drug use: No  . Sexual activity: Yes    Birth control/protection: None  Other Topics Concern  . Not on file  Social History Narrative   Orthoptist.    Social Determinants of Health   Financial Resource Strain:   . Difficulty of Paying Living Expenses:   Food Insecurity:   . Worried About  Charity fundraiser in the Last Year:   . Arboriculturist in the Last Year:   Transportation Needs:   . Film/video editor (Medical):   Marland Kitchen Lack of Transportation (Non-Medical):   Physical Activity:   . Days of Exercise per Week:   . Minutes of Exercise per Session:   Stress:   . Feeling of Stress :   Social Connections:   . Frequency of Communication with Friends and Family:   . Frequency of Social Gatherings with Friends and Family:   . Attends Religious Services:   . Active Member of Clubs or Organizations:   . Attends Archivist Meetings:   Marland Kitchen Marital Status:      Family History: The patient's family history includes Cancer in her maternal grandmother and paternal grandfather; Diabetes in her maternal grandfather and mother; Heart disease in her maternal grandmother; Hyperlipidemia in her father; Hypertension in her father and mother.  ROS:   All other ROS reviewed and negative. Pertinent positives noted in the HPI.     EKGs/Labs/Other Studies Reviewed:   The following studies were personally reviewed by me today:  TTE 10/21/2019 1. Left ventricular ejection fraction, by visual estimation, is 60 to  65%. The left ventricle has normal function. There is no left ventricular  hypertrophy.  2. Mildly dilated left ventricular internal cavity size.  3. Global right ventricle has normal systolic function.The right  ventricular size is normal.  4. Left atrial size was normal.  5. Right atrial size was normal.  6. The mitral valve is normal in structure. No evidence of mitral valve  regurgitation. No evidence of mitral stenosis.  7. The tricuspid valve is normal in structure. Tricuspid valve  regurgitation is trivial.  8. The aortic valve is normal in structure. Aortic valve regurgitation is  not visualized. No evidence of aortic valve sclerosis or stenosis.  9. The pulmonic valve was normal in structure. Pulmonic valve  regurgitation is not visualized.   10. The inferior vena cava is normal in size with greater than 50%  respiratory variability, suggesting right atrial pressure of 3 mmHg.  11. Normal LV systolic and diastolic function.   7 day zio 10/29/2019 Impression:  1. Rare PVCs associated with patient triggered events.  2. No sustained arrhythmias.  3. No atrial fibrillation.    Recent Labs: 10/12/2019: TSH 1.380 03/06/2020: ALT 13; BUN 8; Potassium 3.3; Sodium 129 03/29/2020: Creatinine, Ser 0.59; Hemoglobin 8.9; Platelets 161   Recent Lipid Panel No results found for: CHOL, TRIG, HDL, CHOLHDL, VLDL, LDLCALC, LDLDIRECT  Physical Exam:   VS:  BP 128/84   Pulse 69   Ht 5\' 11"  (1.803 m)   Wt 272 lb 12.8 oz (123.7 kg)   SpO2 97%   BMI 38.05 kg/m    Wt Readings from Last 3 Encounters:  06/19/20 272 lb 12.8 oz (123.7 kg)  03/28/20 (!) 315 lb (142.9 kg)  03/17/20 (!) 315 lb (142.9 kg)    General: Well nourished, well developed, in no acute distress Heart: Atraumatic, normal size  Eyes: PEERLA, EOMI  Neck: Supple, no JVD Endocrine: No thryomegaly Cardiac: Normal S1, S2; RRR; no murmurs, rubs, or gallops Lungs: Clear to auscultation bilaterally, no wheezing, rhonchi or rales  Abd: Soft, nontender, no hepatomegaly  Ext: No edema, pulses 2+ Musculoskeletal: No deformities, BUE and BLE strength normal and equal Skin: Warm and dry, no rashes   Neuro: Alert and oriented to person, place, time, and situation, CNII-XII grossly intact, no focal deficits  Psych: Normal mood and affect   ASSESSMENT:   Lynn Roberts is a 34 y.o. female who presents for the following: 1. Chest pain of uncertain etiology   2. Palpitations     PLAN:   1. Chest pain of uncertain etiology 2. Palpitations -Atypical chest pain.  Resolved.  Palpitations, resolved.  Monitor without arrhythmia.  She does have a history of a atrial tachycardia ablation.  This was done in her younger years.  She had no further recurrence of arrhythmia.  Her echo was  normal.  I suspect this is all pregnancy related.  This was her second baby and she may pursue subsequent pregnancies.  I think this is okay.  I see nothing to preclude her from future pregnancies.  I have counseled her to quit smoking. She will follow-up with [place] as needed.  Disposition: Return if symptoms worsen or fail to improve.  Medication Adjustments/Labs and Tests Ordered: Current medicines are reviewed at length with the patient today.  Concerns regarding medicines are outlined above.  No orders of the defined types were placed in this encounter.  No orders of the defined types were placed in this encounter.   Patient Instructions  Medication Instructions:  The current medical regimen is effective;  continue present plan and medications.  *If you need a refill on your cardiac medications before your next appointment, please call your pharmacy*   Follow-Up: At [facility], you and your health needs are our priority.  As part of our continuing mission to provide you with exceptional heart care, we have created designated Provider Care Teams.  These Care Teams include your primary Cardiologist (physician) and Advanced Practice Providers (APPs -  Physician Assistants and Nurse Practitioners) who all work together to provide you with the care you need, when you need it.  We recommend signing up for the patient portal called "MyChart".  Sign up information is provided on this After Visit Summary.  MyChart is used to connect with patients for Virtual Visits (Telemedicine).  Patients are able to view lab/test results, encounter notes, upcoming appointments, etc.  Non-urgent messages can be sent to your provider as well.   To learn more about what you can do with MyChart, go to [email or web address].    Your next appointment:   As needed  The format for your next appointment:   In Person  Provider:   Eleonore Chiquito, MD        Time Spent with Patient: I have spent a total of  15 minutes with patient reviewing hospital notes, telemetry, EKGs, labs and examining the  patient as well as establishing an assessment and plan that was discussed with the patient.  > 50% of time was spent in direct patient care.  Signed, Addison Naegeli. Audie Box, [place]  [address] [place], [place] [address] [phone]  06/19/2020 9:35 AM

## 2020-06-19 ENCOUNTER — Encounter: Payer: Self-pay | Admitting: Cardiovascular Disease

## 2020-06-19 ENCOUNTER — Ambulatory Visit (INDEPENDENT_AMBULATORY_CARE_PROVIDER_SITE_OTHER): Payer: No Typology Code available for payment source | Admitting: Cardiovascular Disease

## 2020-06-19 ENCOUNTER — Other Ambulatory Visit: Payer: Self-pay

## 2020-06-19 VITALS — BP 128/84 | HR 69 | Ht 71.0 in | Wt 272.8 lb

## 2020-06-19 DIAGNOSIS — R002 Palpitations: Secondary | ICD-10-CM | POA: Diagnosis not present

## 2020-06-19 DIAGNOSIS — R079 Chest pain, unspecified: Secondary | ICD-10-CM | POA: Diagnosis not present

## 2020-06-19 NOTE — Patient Instructions (Signed)
Medication Instructions:  The current medical regimen is effective;  continue present plan and medications.  *If you need a refill on your cardiac medications before your next appointment, please call your pharmacy*    Follow-Up: At [facility], you and your health needs are our priority.  As part of our continuing mission to provide you with exceptional heart care, we have created designated Provider Care Teams.  These Care Teams include your primary Cardiologist (physician) and Advanced Practice Providers (APPs -  Physician Assistants and Nurse Practitioners) who all work together to provide you with the care you need, when you need it.  We recommend signing up for the patient portal called "MyChart".  Sign up information is provided on this After Visit Summary.  MyChart is used to connect with patients for Virtual Visits (Telemedicine).  Patients are able to view lab/test results, encounter notes, upcoming appointments, etc.  Non-urgent messages can be sent to your provider as well.   To learn more about what you can do with MyChart, go to [email or web address].    Your next appointment:   As needed  The format for your next appointment:   In Person  Provider:   [place] O'Neal, MD      

## 2020-08-13 ENCOUNTER — Ambulatory Visit (HOSPITAL_COMMUNITY)
Admission: EM | Admit: 2020-08-13 | Discharge: 2020-08-13 | Disposition: A | Discharge status: Home / Self Care | Payer: No Typology Code available for payment source | Attending: Family Medicine | Admitting: Family Medicine

## 2020-08-13 ENCOUNTER — Other Ambulatory Visit: Payer: Self-pay

## 2020-08-13 ENCOUNTER — Encounter (HOSPITAL_COMMUNITY): Payer: Self-pay

## 2020-08-13 DIAGNOSIS — R3911 Hesitancy of micturition: Secondary | ICD-10-CM

## 2020-08-13 DIAGNOSIS — K644 Residual hemorrhoidal skin tags: Secondary | ICD-10-CM | POA: Diagnosis not present

## 2020-08-13 DIAGNOSIS — L0231 Cutaneous abscess of buttock: Secondary | ICD-10-CM | POA: Diagnosis not present

## 2020-08-13 MED ORDER — DOXYCYCLINE HYCLATE 100 MG PO CAPS
100.0000 mg | ORAL_CAPSULE | Freq: Two times a day (BID) | ORAL | 0 refills | Status: DC
Start: 2020-08-13 — End: 2020-08-17

## 2020-08-13 MED ORDER — HYDROCORTISONE (PERIANAL) 2.5 % EX CREA
1.0000 | TOPICAL_CREAM | Freq: Two times a day (BID) | CUTANEOUS | 0 refills | Status: DC
Start: 2020-08-13 — End: 2020-08-17

## 2020-08-13 MED ORDER — METRONIDAZOLE 0.75 % EX GEL: 1.0000 "application " | Freq: Two times a day (BID) | CUTANEOUS | 0 refills | Status: DC

## 2020-08-13 NOTE — ED Provider Notes (Signed)
[facility]    CSN: [id number] Arrival date & time: 08/13/20  1128      History   Chief Complaint Chief Complaint  Patient presents with  . in car [phone]  . Hemorrhoids    HPI Lynn Roberts is a 34 y.o. female.   Presenting today with about 1 week of worsening anal pain from inflamed hemorrhoids and also some left gluteal swelling and severe tenderness. Notes she had this issue with her pregnancy earlier this year but since has not had sxs. Does note she's been constipated lately and having to strain some. Started back on witch hazel wipes and suppositories but has not had any relief. This morning having urinary hesitancy and tinged pink blood with BM. Denies abdominal pain, fever, chills, N/V, dysuria.       Past Medical History:  Diagnosis Date  . Arthritis of low back   . Complication of anesthesia    is of [name] descent  . Family history of adverse reaction to anesthesia    pt is a Lumbi [name]  . GERD (gastroesophageal reflux disease)   . History of PAT (paroxysmal atrial tachycardia)    had cardiac ablation as a teenager  . Migraines   . Miscarriage 04/16/2016  . Obesity   . Papilloma of breast 10/2014   left  . PCO (polycystic ovaries) 07/14/2017  . PCOD (polycystic ovarian disease)    no menses  . Supervision of normal pregnancy 04/09/2016    [facility] at   7+1 week FOB  Charlann Noss Dating By  [place]  Pap  04/09/16 GC/CT Initial:                36+wks: Genetic Screen NT/IT:  CF screen  Anatomic [place]  Flu vaccine  Tdap Recommended ~ 28wks Glucose Screen  2 hr GBS  Feed Preference  Contraception  Circumcision  Childbirth Classes  Pediatrician      Patient Active Problem List   Diagnosis Date Noted  . Placenta previa before labor and cesarean delivery without hemorrhage 03/28/2020  . Iron deficiency anemia due to chronic blood loss 03/03/2020  . Papilloma of breast, left 06/08/2014  . Back pain, chronic 08/10/2013  .  Diverticulosis 08/10/2013  . Osteoarthritis of knee 08/10/2013  . Sleep apnea 08/10/2013    Past Surgical History:  Procedure Laterality Date  . BREAST LUMPECTOMY Left 2011   papilloma  . BREAST LUMPECTOMY WITH RADIOACTIVE SEED LOCALIZATION Left 10/18/2014   Procedure:  RADIOACTIVE SEED GUIDED EXCISIONAL BREAST BIOPSY;  Surgeon: Alphonsa Overall, MD;  Location: [facility];  Service: General;  Laterality: Left;  . BREAST SURGERY Right 07/27/2004   retro-areolar dissection with exc. of large duct  . CARDIAC ELECTROPHYSIOLOGY STUDY AND ABLATION  age 75  . CESAREAN SECTION N/A 03/28/2020   Procedure: CESAREAN SECTION;  Surgeon: Deliah Boston, MD;  [facility];  Service: Obstetrics;  Laterality: N/A;  RNFA Tracey  . CHOLECYSTECTOMY  04/27/2008  . GASTROPLASTY DUODENAL SWITCH  01/2014  . OVARIAN CYST REMOVAL Bilateral   . TOENAIL EXCISION    . UNILATERAL SALPINGECTOMY      OB History    Gravida  3   Para  1   Term      Preterm  1   AB  1   Living        SAB  1   TAB      Ectopic      Multiple  0   Live Births               Home Medications    Prior to Admission medications   Medication Sig Start Date End Date Taking? Authorizing Provider  doxycycline (VIBRAMYCIN) 100 MG capsule Take 1 capsule (100 mg total) by mouth 2 (two) times daily. 08/13/20   Volney American, PA-C  hydrocortisone (ANUSOL-HC) 2.5 % rectal cream Place 1 application rectally 2 (two) times daily. 08/13/20   Volney American, PA-C  metroNIDAZOLE (METROGEL) 0.75 % gel Apply 1 application topically 2 (two) times daily. 08/13/20   Volney American, PA-C    Family History Family History  Problem Relation Age of Onset  . Cancer Paternal Grandfather   . Cancer Maternal Grandmother   . Heart disease Maternal Grandmother   . Diabetes Maternal Grandfather   . Hypertension Father   . Hyperlipidemia Father   . Hypertension Mother   . Diabetes Mother      Social History Social History   Tobacco Use  . Smoking status: Current Every Day Smoker    Packs/day: 0.50    Years: 10.00    Pack years: 5.00    Types: Cigarettes  . Smokeless tobacco: Never Used  Vaping Use  . Vaping Use: Never used  Substance Use Topics  . Alcohol use: No    Comment: occ.  . Drug use: No     Allergies   Nsaids, Other, Sulfa antibiotics, and Tramadol   Review of Systems Review of Systems PER HPI    Physical Exam Triage Vital Signs ED Triage Vitals  Enc Vitals Group     BP 08/13/20 1235 132/78     Pulse Rate 08/13/20 1235 91     Resp 08/13/20 1235 19     Temp 08/13/20 1235 98.7 F (37.1 C)     Temp src --      SpO2 08/13/20 1235 100 %     Weight --      Height --      Head Circumference --      Peak Flow --      Pain Score 08/13/20 1233 10     Pain Loc --      Pain Edu? --      Excl. in [place]? --    No data found.  Updated Vital Signs BP 132/78   Pulse 91   Temp 98.7 F (37.1 C)   Resp 19   LMP 07/16/2020   SpO2 100%   Breastfeeding No   Visual Acuity Right Eye Distance:   Left Eye Distance:   Bilateral Distance:    Right Eye Near:   Left Eye Near:    Bilateral Near:     Physical Exam Vitals and nursing note reviewed.  Constitutional:      Appearance: Normal appearance. She is not ill-appearing.  HENT:     Head: Atraumatic.  Eyes:     Extraocular Movements: Extraocular movements intact.     Conjunctiva/sclera: Conjunctivae normal.  Cardiovascular:     Rate and Rhythm: Normal rate and regular rhythm.     Heart sounds: Normal heart sounds.  Pulmonary:     Effort: Pulmonary effort is normal.     Breath sounds: Normal breath sounds.  Abdominal:     General: Bowel sounds are normal. There is no distension.     Palpations: Abdomen is soft.     Tenderness: There is no abdominal tenderness. There is no right CVA tenderness, left CVA  tenderness or guarding.  Genitourinary:   Musculoskeletal:        General:  Normal range of motion.     Cervical back: Normal range of motion and neck supple.  Skin:    General: Skin is warm and dry.  Neurological:     Mental Status: She is alert and oriented to person, place, and time.  Psychiatric:        Mood and Affect: Mood normal.        Thought Content: Thought content normal.        Judgment: Judgment normal.      UC Treatments / Results  Labs (all labs ordered are listed, but only abnormal results are displayed) Labs Reviewed - No data to display  EKG   Radiology No results found.  Procedures Procedures (including critical care time)  Medications Ordered in UC Medications - No data to display  Initial Impression / Assessment and Plan / UC Course  I have reviewed the triage vital signs and the nursing notes.  Pertinent labs & imaging results that were available during my care of the patient were reviewed by me and considered in my medical decision making (see chart for details).     Pt was unable after multiple attempts today to provide a urine specimen for U/A, discussed if she is still unable to reliably pass urine by end of day that she will need to report to the ED for further eval and possible catheterization. I will rx doxycycline for her gluteal cellulitis with probable abscess and also metrogel for topical use given amount of discharge present in the area. Anusol cream sent additionally for her thrombosed and inflamed external hemorrhoids. She is aware she may need to see Gen Surgery for these if sxs do not improve given extent. REcommended she obtain a PCP next week to be able to follow up on these issues. Strict return precautions reviewed for worsening sxs.     Final Clinical Impressions(s) / UC Diagnoses   Final diagnoses:  Inflamed external hemorrhoid  Abscess, gluteal, left  Urinary hesitancy     Discharge Instructions     Try to get established as soon as possible with a Primary Care Provider who can follow up on  these issues and how you are doing overall    ED Prescriptions    Medication Sig Dispense Auth. Provider   doxycycline (VIBRAMYCIN) 100 MG capsule Take 1 capsule (100 mg total) by mouth 2 (two) times daily. 14 capsule Volney American, [place]   metroNIDAZOLE (METROGEL) 0.75 % gel Apply 1 application topically 2 (two) times daily. [place], [place]   hydrocortisone (ANUSOL-HC) 2.5 % rectal cream Place 1 application rectally 2 (two) times daily. 30 g Volney American, [place]     PDMP not reviewed this encounter.   Volney American, [place] 08/13/20 1941

## 2020-08-13 NOTE — ED Triage Notes (Signed)
Pt presents with complaints of hemorrhoids x 1 week. Reports she has not had any since she was pregnant in April . She has tried otc medications with no relief. Reports they started bleeding this morning. She is having some constipation she thinks due to the swelling. Reports her entire bottom feels swollen.

## 2020-08-13 NOTE — Discharge Instructions (Signed)
Try to get established as soon as possible with a Primary Care Provider who can follow up on these issues and how you are doing overall

## 2020-08-13 NOTE — ED Notes (Signed)
Pt states she cannot urinate at this time, she has urinary urgency but cannot urinate.

## 2020-08-14 ENCOUNTER — Observation Stay (HOSPITAL_COMMUNITY): Payer: No Typology Code available for payment source

## 2020-08-14 ENCOUNTER — Inpatient Hospital Stay (HOSPITAL_COMMUNITY)
Admission: AD | Admit: 2020-08-14 | Discharge: 2020-08-17 | DRG: 346 | Disposition: A | Discharge status: Home / Self Care | Payer: No Typology Code available for payment source | Source: Ambulatory Visit | Attending: General Surgery | Admitting: General Surgery

## 2020-08-14 ENCOUNTER — Other Ambulatory Visit: Payer: Self-pay

## 2020-08-14 ENCOUNTER — Encounter (HOSPITAL_COMMUNITY): Payer: Self-pay

## 2020-08-14 DIAGNOSIS — D500 Iron deficiency anemia secondary to blood loss (chronic): Secondary | ICD-10-CM | POA: Diagnosis present

## 2020-08-14 DIAGNOSIS — E669 Obesity, unspecified: Secondary | ICD-10-CM | POA: Diagnosis present

## 2020-08-14 DIAGNOSIS — K611 Rectal abscess: Secondary | ICD-10-CM | POA: Diagnosis present

## 2020-08-14 DIAGNOSIS — K6139 Other ischiorectal abscess: Secondary | ICD-10-CM | POA: Diagnosis present

## 2020-08-14 DIAGNOSIS — M1990 Unspecified osteoarthritis, unspecified site: Secondary | ICD-10-CM | POA: Diagnosis present

## 2020-08-14 DIAGNOSIS — G4730 Sleep apnea, unspecified: Secondary | ICD-10-CM | POA: Diagnosis present

## 2020-08-14 DIAGNOSIS — K612 Anorectal abscess: Principal | ICD-10-CM | POA: Diagnosis present

## 2020-08-14 DIAGNOSIS — F17210 Nicotine dependence, cigarettes, uncomplicated: Secondary | ICD-10-CM | POA: Diagnosis present

## 2020-08-14 DIAGNOSIS — K645 Perianal venous thrombosis: Secondary | ICD-10-CM | POA: Diagnosis present

## 2020-08-14 DIAGNOSIS — Z6837 Body mass index (BMI) 37.0-37.9, adult: Secondary | ICD-10-CM

## 2020-08-14 DIAGNOSIS — K219 Gastro-esophageal reflux disease without esophagitis: Secondary | ICD-10-CM | POA: Diagnosis present

## 2020-08-14 DIAGNOSIS — Z833 Family history of diabetes mellitus: Secondary | ICD-10-CM

## 2020-08-14 DIAGNOSIS — Z8249 Family history of ischemic heart disease and other diseases of the circulatory system: Secondary | ICD-10-CM

## 2020-08-14 DIAGNOSIS — Z800 Family history of malignant neoplasm of digestive organs: Secondary | ICD-10-CM

## 2020-08-14 DIAGNOSIS — Z882 Allergy status to sulfonamides status: Secondary | ICD-10-CM

## 2020-08-14 DIAGNOSIS — K5900 Constipation, unspecified: Secondary | ICD-10-CM | POA: Diagnosis present

## 2020-08-14 DIAGNOSIS — Z20822 Contact with and (suspected) exposure to covid-19: Secondary | ICD-10-CM | POA: Diagnosis present

## 2020-08-14 DIAGNOSIS — R3911 Hesitancy of micturition: Secondary | ICD-10-CM | POA: Diagnosis present

## 2020-08-14 DIAGNOSIS — Z886 Allergy status to analgesic agent status: Secondary | ICD-10-CM

## 2020-08-14 DIAGNOSIS — Z803 Family history of malignant neoplasm of breast: Secondary | ICD-10-CM

## 2020-08-14 LAB — MAGNESIUM: Magnesium: 1.9 mg/dL (ref 1.7–2.4)

## 2020-08-14 LAB — CBC
HCT: 36.5 % (ref 36.0–46.0)
Hemoglobin: 11.5 g/dL — ABNORMAL LOW (ref 12.0–15.0)
MCH: 25.7 pg — ABNORMAL LOW (ref 26.0–34.0)
MCHC: 31.5 g/dL (ref 30.0–36.0)
MCV: 81.5 fL (ref 80.0–100.0)
Platelets: 236 10*3/uL (ref 150–400)
RBC: 4.48 MIL/uL (ref 3.87–5.11)
RDW: 15.9 % — ABNORMAL HIGH (ref 11.5–15.5)
WBC: 19.9 10*3/uL — ABNORMAL HIGH (ref 4.0–10.5)
nRBC: 0 % (ref 0.0–0.2)

## 2020-08-14 LAB — URINALYSIS, COMPLETE (UACMP) WITH MICROSCOPIC
Bilirubin Urine: NEGATIVE
Glucose, UA: NEGATIVE mg/dL
Hgb urine dipstick: NEGATIVE
Ketones, ur: NEGATIVE mg/dL
Leukocytes,Ua: NEGATIVE
Nitrite: NEGATIVE
Protein, ur: NEGATIVE mg/dL
Specific Gravity, Urine: 1.01 (ref 1.005–1.030)
pH: 6 (ref 5.0–8.0)

## 2020-08-14 LAB — COMPREHENSIVE METABOLIC PANEL
ALT: 10 U/L (ref 0–44)
AST: 11 U/L — ABNORMAL LOW (ref 15–41)
Albumin: 3.4 g/dL — ABNORMAL LOW (ref 3.5–5.0)
Alkaline Phosphatase: 66 U/L (ref 38–126)
Anion gap: 13 (ref 5–15)
BUN: 8 mg/dL (ref 6–20)
CO2: 25 mmol/L (ref 22–32)
Calcium: 8.9 mg/dL (ref 8.9–10.3)
Chloride: 99 mmol/L (ref 98–111)
Creatinine, Ser: 0.53 mg/dL (ref 0.44–1.00)
GFR calc Af Amer: >60 mL/min (ref >60)
GFR calc non Af Amer: >60 mL/min (ref >60)
Glucose, Bld: 113 mg/dL — ABNORMAL HIGH (ref 70–99)
Potassium: 3.1 mmol/L — ABNORMAL LOW (ref 3.5–5.1)
Sodium: 137 mmol/L (ref 135–145)
Total Bilirubin: 0.6 mg/dL (ref 0.3–1.2)
Total Protein: 7.4 g/dL (ref 6.5–8.1)

## 2020-08-14 LAB — PROTIME-INR
INR: 1.2 (ref 0.8–1.2)
Prothrombin Time: 14.4 seconds (ref 11.4–15.2)

## 2020-08-14 LAB — SARS CORONAVIRUS 2 BY RT PCR (HOSPITAL ORDER, PERFORMED IN ~~LOC~~ HOSPITAL LAB): SARS Coronavirus 2: NEGATIVE

## 2020-08-14 LAB — HIV ANTIBODY (ROUTINE TESTING W REFLEX): HIV Screen 4th Generation wRfx: NONREACTIVE

## 2020-08-14 MED ORDER — DIPHENHYDRAMINE HCL 50 MG/ML IJ SOLN: 25.0000 mg | Freq: Four times a day (QID) | INTRAMUSCULAR | Status: DC | PRN

## 2020-08-14 MED ORDER — OXYCODONE HCL 5 MG PO TABS
5.0000 mg | ORAL_TABLET | ORAL | Status: DC | PRN
  Administered 2020-08-14 – 2020-08-16 (×5): 10 mg via ORAL
  Administered 2020-08-16 (×2): 5 mg via ORAL
  Administered 2020-08-16 – 2020-08-17 (×4): 10 mg via ORAL
  Filled 2020-08-14 (×10): qty 2

## 2020-08-14 MED ORDER — ONDANSETRON HCL 4 MG/2ML IJ SOLN
4.0000 mg | Freq: Four times a day (QID) | INTRAMUSCULAR | Status: DC | PRN
  Administered 2020-08-14: 4 mg via INTRAVENOUS
  Filled 2020-08-14: qty 2

## 2020-08-14 MED ORDER — PIPERACILLIN-TAZOBACTAM 3.375 G IVPB
3.3750 g | Freq: Three times a day (TID) | INTRAVENOUS | Status: DC
  Administered 2020-08-14 – 2020-08-17 (×8): 3.375 g via INTRAVENOUS
  Filled 2020-08-14 (×11): qty 50

## 2020-08-14 MED ORDER — ONDANSETRON 4 MG PO TBDP: 4.0000 mg | ORAL_TABLET | Freq: Four times a day (QID) | ORAL | Status: DC | PRN

## 2020-08-14 MED ORDER — DIPHENHYDRAMINE HCL 25 MG PO CAPS
25.0000 mg | ORAL_CAPSULE | Freq: Four times a day (QID) | ORAL | Status: DC | PRN
  Administered 2020-08-14: 25 mg via ORAL
  Filled 2020-08-14: qty 1

## 2020-08-14 MED ORDER — SODIUM CHLORIDE 0.9 % IV SOLN: INTRAVENOUS | Status: DC | PRN

## 2020-08-14 MED ORDER — IOHEXOL 300 MG/ML  SOLN
100.0000 mL | Freq: Once | INTRAMUSCULAR | Status: AC | PRN
  Administered 2020-08-14: 100 mL via INTRAVENOUS

## 2020-08-14 MED ORDER — SODIUM CHLORIDE 0.9 % IV SOLN: INTRAVENOUS | Status: DC

## 2020-08-14 MED ORDER — HYDROMORPHONE HCL 1 MG/ML IJ SOLN
0.5000 mg | INTRAMUSCULAR | Status: DC | PRN
  Administered 2020-08-14 – 2020-08-15 (×5): 1 mg via INTRAVENOUS
  Filled 2020-08-14 (×5): qty 1

## 2020-08-14 NOTE — H&P (Signed)
[name] Appointment: 08/14/2020 3:15 PM Location: [facility] Patient #: [id number] DOB: 1986/05/01 Married / Language: Undefined / Race: Refused to Report/Unreported Female  History of Present Illness The patient is a 34 year old female who presents with anal pain.Patient was seen at [facility] urgent care yesterday, 08/13/20, for anal pain x 1 week. She states she had this issue with her pregnancy earlier this year, but has not had any recent episodes of anal pain. She states she has been constipated. She was using witch hazel wipes and suppositories without much relief. She has also been having some urinary hesitancy and retention. She states she has not urinated since last night. She also had some incontinence to stool this morning in the shower. Yesterday, she was prescribed doxycycline and Anusol cream but she states after using these treatments, her swelling became much worse. She denies fevers. She has not eaten since yesterday due to decreased appetite and pain.   Past surgical history significant for duodenal switch in 2015 at [facility], bilateral breast surgeries for papillomas, C-section 2, cholecystectomy, and cardiac ablation for PAT as teenager. She currently denies any cardiac or pulmonary issues. Denies having diabetes. She is not on blood thinners. She smokes less than 1 pack per day. She has never had an abscess in this area before. She states she has never been diagnosed with covid and has not been vaccinated.   Past Surgical History Breast Biopsy Bilateral. Cesarean Section - Multiple  Diagnostic Studies History  Colonoscopy 5-10 years ago Mammogram >3 years ago Pap Smear 1-5 years ago  Allergies Ansaid *ANALGESICS - ANTI-INFLAMMATORY* Allergies Reconciled  Medication History  Doxycycline Hyclate (100MG  Capsule, Oral) Active. Medications Reconciled  Social History Alcohol use Occasional alcohol use. Caffeine use Tea. No drug  use Tobacco use Current every day smoker.  Family History Breast Cancer Family Members In General. Diabetes Mellitus Father, Mother. Heart Disease Father. Hypertension Father. Malignant Neoplasm Of Pancreas Family Members In General.  Pregnancy / Birth History Gravida 3 Irregular periods Maternal age 59-35 Para 2  Other Problems  Back Pain General anesthesia - complications Hemorrhoids Lump In Breast Oophorectomy  Review of Systems General Not Present- Appetite Loss, Chills, Fatigue, Fever, Night Sweats, Weight Gain and Weight Loss. Skin Present- New Lesions. Not Present- Change in Wart/Mole, Dryness, Hives, Jaundice, Non-Healing Wounds, Rash and Ulcer. HEENT Not Present- Earache, Hearing Loss, Hoarseness, Nose Bleed, Oral Ulcers, Ringing in the Ears, Seasonal Allergies, Sinus Pain, Sore Throat, Visual Disturbances, Wears glasses/contact lenses and Yellow Eyes. Respiratory Not Present- Bloody sputum, Chronic Cough, Difficulty Breathing, Snoring and Wheezing. Breast Not Present- Breast Mass, Breast Pain, Nipple Discharge and Skin Changes. Cardiovascular Not Present- Chest Pain, Difficulty Breathing Lying Down, Leg Cramps, Palpitations, Rapid Heart Rate, Shortness of Breath and Swelling of Extremities. Gastrointestinal Present- Hemorrhoids and Rectal Pain. Not Present- Abdominal Pain, Bloating, Bloody Stool, Change in Bowel Habits, Chronic diarrhea, Constipation, Difficulty Swallowing, Excessive gas, Gets full quickly at meals, Indigestion, Nausea and Vomiting. Female Genitourinary Present- Frequency. Not Present- Nocturia, Painful Urination, Pelvic Pain and Urgency. Musculoskeletal Not Present- Back Pain, Joint Pain, Joint Stiffness, Muscle Pain, Muscle Weakness and Swelling of Extremities. Neurological Not Present- Decreased Memory, Fainting, Headaches, Numbness, Seizures, Tingling, Tremor, Trouble walking and Weakness. Psychiatric Not Present- Anxiety, Bipolar,  Change in Sleep Pattern, Depression, Fearful and Frequent crying. Endocrine Not Present- Cold Intolerance, Excessive Hunger, Hair Changes, Heat Intolerance, Hot flashes and New Diabetes. Hematology Not Present- Blood Thinners, Easy Bruising, Excessive bleeding, Gland problems, HIV and Persistent  Infections.   Vitals 08/14/2020 3:35 PM Weight: 258.5 lb Height: 70in Body Surface Area: 2.33 m Body Mass Index: 37.09 kg/m  Temp.: 98.49F  Pulse: 105 (Regular)  P.OX: 93% (Room air) BP: 124/84(Sitting, Left Arm, Standard)   Physical Exam GENERAL: Well-developed, well nourished female in no acute distress Wearing mask  EYES: No scleral icterus Pupils equal, lids normal  RESPIRATORY: Normal effort, no use of accessory muscles  MUSCULOSKELETAL: Normal gait Grossly normal ROM upper extremities Grossly normal ROM lower extremities  SKIN: Warm and dry Not diaphoretic  PSYCHIATRIC: Normal judgement and insight Normal mood and affect Alert, oriented x 3  Rectal On the LEFT posterior perianal region, there is a 5 cm x 4 cm area of swelling and erythema with tenderness to palpation. No active drainage There is some erythema tracking to the RIGHT posterior perianal region which is also TTP There is another vague area of firmness on the right medial buttock without overlying erythema or swelling  One small posterior midline thrombosed hemorrhoid No active bleeding or drainage    Assessment & Plan PERIRECTAL ABSCESS (K61.1) She is presenting to the office with anal pain 1 week. On exam, she likely has a left-sided perianal abscess, possibly horseshoe abscess given that there is some erythema on the right side as well. Overall, she appears stable, afebrile, P 105. Dr. Johney Maine also evaluated the patient with me. Given the tenderness on exam. the possibility of large extensive abscess, and urinary retention, he recommended direct admitting her to [facility] for I&D  tomorrow in [place]. I spoke to Dr. Dema Severin who agrees with this plan. He recommended obtaining labs, CT scan abdomen and pelvis with IV contrast, IV antibiotics, in-and-out catheter, and nothing by mouth after midnight. The patient also agrees with this plan.  **This patient encounter took 25 minutes today to perform the following: take history, perform exam, review outside records, interpret imaging, counsel the patient on their diagnosis and document encounter, findings & plan in the EHR.  Carlena Hurl, PA-C

## 2020-08-15 ENCOUNTER — Inpatient Hospital Stay (HOSPITAL_COMMUNITY): Payer: No Typology Code available for payment source | Admitting: Certified Registered Nurse Anesthetist

## 2020-08-15 ENCOUNTER — Encounter (HOSPITAL_COMMUNITY): Admission: AD | Disposition: A | Discharge status: Home / Self Care | Payer: Self-pay | Source: Ambulatory Visit

## 2020-08-15 ENCOUNTER — Encounter (HOSPITAL_COMMUNITY): Payer: Self-pay

## 2020-08-15 DIAGNOSIS — R3911 Hesitancy of micturition: Secondary | ICD-10-CM | POA: Diagnosis present

## 2020-08-15 DIAGNOSIS — E669 Obesity, unspecified: Secondary | ICD-10-CM | POA: Diagnosis present

## 2020-08-15 DIAGNOSIS — K645 Perianal venous thrombosis: Secondary | ICD-10-CM | POA: Diagnosis present

## 2020-08-15 DIAGNOSIS — K219 Gastro-esophageal reflux disease without esophagitis: Secondary | ICD-10-CM | POA: Diagnosis present

## 2020-08-15 DIAGNOSIS — K5900 Constipation, unspecified: Secondary | ICD-10-CM | POA: Diagnosis present

## 2020-08-15 DIAGNOSIS — Z800 Family history of malignant neoplasm of digestive organs: Secondary | ICD-10-CM | POA: Diagnosis not present

## 2020-08-15 DIAGNOSIS — Z20822 Contact with and (suspected) exposure to covid-19: Secondary | ICD-10-CM | POA: Diagnosis present

## 2020-08-15 DIAGNOSIS — G4730 Sleep apnea, unspecified: Secondary | ICD-10-CM | POA: Diagnosis present

## 2020-08-15 DIAGNOSIS — K612 Anorectal abscess: Secondary | ICD-10-CM | POA: Diagnosis present

## 2020-08-15 DIAGNOSIS — Z833 Family history of diabetes mellitus: Secondary | ICD-10-CM | POA: Diagnosis not present

## 2020-08-15 DIAGNOSIS — K603 Anal fistula: Secondary | ICD-10-CM | POA: Diagnosis not present

## 2020-08-15 DIAGNOSIS — Z886 Allergy status to analgesic agent status: Secondary | ICD-10-CM | POA: Diagnosis not present

## 2020-08-15 DIAGNOSIS — M1990 Unspecified osteoarthritis, unspecified site: Secondary | ICD-10-CM | POA: Diagnosis present

## 2020-08-15 DIAGNOSIS — Z882 Allergy status to sulfonamides status: Secondary | ICD-10-CM | POA: Diagnosis not present

## 2020-08-15 DIAGNOSIS — M6281 Muscle weakness (generalized): Secondary | ICD-10-CM | POA: Diagnosis not present

## 2020-08-15 DIAGNOSIS — Z803 Family history of malignant neoplasm of breast: Secondary | ICD-10-CM | POA: Diagnosis not present

## 2020-08-15 DIAGNOSIS — K6131 Horseshoe abscess: Secondary | ICD-10-CM | POA: Diagnosis not present

## 2020-08-15 DIAGNOSIS — K6139 Other ischiorectal abscess: Secondary | ICD-10-CM | POA: Diagnosis present

## 2020-08-15 DIAGNOSIS — Z6837 Body mass index (BMI) 37.0-37.9, adult: Secondary | ICD-10-CM | POA: Diagnosis not present

## 2020-08-15 DIAGNOSIS — D500 Iron deficiency anemia secondary to blood loss (chronic): Secondary | ICD-10-CM | POA: Diagnosis present

## 2020-08-15 DIAGNOSIS — K611 Rectal abscess: Secondary | ICD-10-CM | POA: Diagnosis not present

## 2020-08-15 DIAGNOSIS — F17210 Nicotine dependence, cigarettes, uncomplicated: Secondary | ICD-10-CM | POA: Diagnosis present

## 2020-08-15 DIAGNOSIS — Z8249 Family history of ischemic heart disease and other diseases of the circulatory system: Secondary | ICD-10-CM | POA: Diagnosis not present

## 2020-08-15 HISTORY — PX: INCISION AND DRAINAGE PERIRECTAL ABSCESS: SHX1804

## 2020-08-15 HISTORY — PX: PLACEMENT OF SETON: SHX6029

## 2020-08-15 LAB — BASIC METABOLIC PANEL
Anion gap: 12 (ref 5–15)
BUN: 6 mg/dL (ref 6–20)
CO2: 24 mmol/L (ref 22–32)
Calcium: 8.3 mg/dL — ABNORMAL LOW (ref 8.9–10.3)
Chloride: 98 mmol/L (ref 98–111)
Creatinine, Ser: 0.58 mg/dL (ref 0.44–1.00)
GFR calc Af Amer: >60 mL/min (ref >60)
GFR calc non Af Amer: >60 mL/min (ref >60)
Glucose, Bld: 113 mg/dL — ABNORMAL HIGH (ref 70–99)
Potassium: 3.3 mmol/L — ABNORMAL LOW (ref 3.5–5.1)
Sodium: 134 mmol/L — ABNORMAL LOW (ref 135–145)

## 2020-08-15 LAB — CBC
HCT: 35 % — ABNORMAL LOW (ref 36.0–46.0)
Hemoglobin: 11 g/dL — ABNORMAL LOW (ref 12.0–15.0)
MCH: 25.9 pg — ABNORMAL LOW (ref 26.0–34.0)
MCHC: 31.4 g/dL (ref 30.0–36.0)
MCV: 82.5 fL (ref 80.0–100.0)
Platelets: 199 10*3/uL (ref 150–400)
RBC: 4.24 MIL/uL (ref 3.87–5.11)
RDW: 15.8 % — ABNORMAL HIGH (ref 11.5–15.5)
WBC: 18.4 10*3/uL — ABNORMAL HIGH (ref 4.0–10.5)
nRBC: 0 % (ref 0.0–0.2)

## 2020-08-15 LAB — PREGNANCY, URINE: Preg Test, Ur: NEGATIVE

## 2020-08-15 SURGERY — INCISION AND DRAINAGE, ABSCESS, PERIRECTAL
Anesthesia: General

## 2020-08-15 MED ORDER — PROPOFOL 10 MG/ML IV BOLUS
INTRAVENOUS | Status: DC | PRN
  Administered 2020-08-15: 200 mg via INTRAVENOUS

## 2020-08-15 MED ORDER — DEXAMETHASONE SODIUM PHOSPHATE 10 MG/ML IJ SOLN
INTRAMUSCULAR | Status: AC
  Filled 2020-08-15: qty 1

## 2020-08-15 MED ORDER — CHLORHEXIDINE GLUCONATE CLOTH 2 % EX PADS
6.0000 | MEDICATED_PAD | Freq: Every day | CUTANEOUS | Status: DC
  Administered 2020-08-15 – 2020-08-16 (×2): 6 via TOPICAL

## 2020-08-15 MED ORDER — MIDAZOLAM HCL 5 MG/5ML IJ SOLN
INTRAMUSCULAR | Status: DC | PRN
  Administered 2020-08-15: 2 mg via INTRAVENOUS

## 2020-08-15 MED ORDER — POLYETHYLENE GLYCOL 3350 17 G PO PACK
17.0000 g | PACK | Freq: Every day | ORAL | Status: DC
  Administered 2020-08-15 – 2020-08-17 (×3): 17 g via ORAL
  Filled 2020-08-15 (×3): qty 1

## 2020-08-15 MED ORDER — ROCURONIUM BROMIDE 100 MG/10ML IV SOLN
INTRAVENOUS | Status: DC | PRN
  Administered 2020-08-15: 100 mg via INTRAVENOUS

## 2020-08-15 MED ORDER — ACETAMINOPHEN 500 MG PO TABS
1000.0000 mg | ORAL_TABLET | Freq: Four times a day (QID) | ORAL | Status: DC
  Administered 2020-08-15 – 2020-08-17 (×7): 1000 mg via ORAL
  Filled 2020-08-15 (×7): qty 2

## 2020-08-15 MED ORDER — FENTANYL CITRATE (PF) 100 MCG/2ML IJ SOLN
INTRAMUSCULAR | Status: DC | PRN
Start: 2020-08-15 — End: 2020-08-15
  Administered 2020-08-15 (×2): 100 ug via INTRAVENOUS
  Administered 2020-08-15: 50 ug via INTRAVENOUS

## 2020-08-15 MED ORDER — LACTATED RINGERS IV SOLN: INTRAVENOUS | Status: DC | PRN

## 2020-08-15 MED ORDER — PHENYLEPHRINE 40 MCG/ML (10ML) SYRINGE FOR IV PUSH (FOR BLOOD PRESSURE SUPPORT)
PREFILLED_SYRINGE | INTRAVENOUS | Status: AC
  Filled 2020-08-15: qty 10

## 2020-08-15 MED ORDER — DEXAMETHASONE SODIUM PHOSPHATE 4 MG/ML IJ SOLN
INTRAMUSCULAR | Status: DC | PRN
  Administered 2020-08-15: 10 mg via INTRAVENOUS

## 2020-08-15 MED ORDER — MIDAZOLAM HCL 2 MG/2ML IJ SOLN
INTRAMUSCULAR | Status: AC
  Filled 2020-08-15: qty 2

## 2020-08-15 MED ORDER — LIDOCAINE 2% (20 MG/ML) 5 ML SYRINGE
INTRAMUSCULAR | Status: AC
  Filled 2020-08-15: qty 5

## 2020-08-15 MED ORDER — ALBUTEROL SULFATE HFA 108 (90 BASE) MCG/ACT IN AERS
INHALATION_SPRAY | RESPIRATORY_TRACT | Status: AC
  Filled 2020-08-15: qty 6.7

## 2020-08-15 MED ORDER — MORPHINE SULFATE (PF) 4 MG/ML IV SOLN
2.0000 mg | INTRAVENOUS | Status: DC | PRN
  Administered 2020-08-15: 2 mg via INTRAVENOUS
  Filled 2020-08-15: qty 1

## 2020-08-15 MED ORDER — ONDANSETRON HCL 4 MG/2ML IJ SOLN
INTRAMUSCULAR | Status: DC | PRN
  Administered 2020-08-15: 4 mg via INTRAVENOUS

## 2020-08-15 MED ORDER — FENTANYL CITRATE (PF) 100 MCG/2ML IJ SOLN
25.0000 ug | INTRAMUSCULAR | Status: DC | PRN
  Administered 2020-08-15 (×3): 50 ug via INTRAVENOUS

## 2020-08-15 MED ORDER — 0.9 % SODIUM CHLORIDE (POUR BTL) OPTIME
TOPICAL | Status: DC | PRN
  Administered 2020-08-15: 1000 mL

## 2020-08-15 MED ORDER — LIDOCAINE HCL (CARDIAC) PF 100 MG/5ML IV SOSY
PREFILLED_SYRINGE | INTRAVENOUS | Status: DC | PRN
  Administered 2020-08-15: 100 mg via INTRAVENOUS

## 2020-08-15 MED ORDER — DOCUSATE SODIUM 100 MG PO CAPS
100.0000 mg | ORAL_CAPSULE | Freq: Two times a day (BID) | ORAL | Status: DC
  Administered 2020-08-15 – 2020-08-17 (×4): 100 mg via ORAL
  Filled 2020-08-15 (×4): qty 1

## 2020-08-15 MED ORDER — SUGAMMADEX SODIUM 500 MG/5ML IV SOLN
INTRAVENOUS | Status: DC | PRN
  Administered 2020-08-15: 400 mg via INTRAVENOUS

## 2020-08-15 MED ORDER — ENOXAPARIN SODIUM 40 MG/0.4ML ~~LOC~~ SOLN
40.0000 mg | SUBCUTANEOUS | Status: DC
  Administered 2020-08-16 – 2020-08-17 (×2): 40 mg via SUBCUTANEOUS
  Filled 2020-08-15 (×2): qty 0.4

## 2020-08-15 MED ORDER — FENTANYL CITRATE (PF) 100 MCG/2ML IJ SOLN
INTRAMUSCULAR | Status: AC
Start: 2020-08-15 — End: 2020-08-16
  Filled 2020-08-15: qty 2

## 2020-08-15 MED ORDER — PROPOFOL 10 MG/ML IV BOLUS
INTRAVENOUS | Status: AC
  Filled 2020-08-15: qty 20

## 2020-08-15 MED ORDER — FENTANYL CITRATE (PF) 250 MCG/5ML IJ SOLN
INTRAMUSCULAR | Status: AC
  Filled 2020-08-15: qty 5

## 2020-08-15 MED ORDER — ROCURONIUM BROMIDE 10 MG/ML (PF) SYRINGE
PREFILLED_SYRINGE | INTRAVENOUS | Status: AC
  Filled 2020-08-15: qty 10

## 2020-08-15 MED ORDER — ALBUTEROL SULFATE HFA 108 (90 BASE) MCG/ACT IN AERS
INHALATION_SPRAY | RESPIRATORY_TRACT | Status: DC | PRN
  Administered 2020-08-15: 6 via RESPIRATORY_TRACT

## 2020-08-15 SURGICAL SUPPLY — 37 items
BLADE SURG 15 STRL LF DISP TIS (BLADE) ×1 IMPLANT
BLADE SURG 15 STRL SS (BLADE) ×2
BNDG GAUZE ELAST 4 BULKY (GAUZE/BANDAGES/DRESSINGS) ×1 IMPLANT
BRIEF STRETCH FOR OB PAD LRG (UNDERPADS AND DIAPERS) ×2 IMPLANT
COVER SURGICAL LIGHT HANDLE (MISCELLANEOUS) ×2 IMPLANT
COVER WAND RF STERILE (DRAPES) IMPLANT
DRAIN PENROSE 0.25X18 (DRAIN) ×2 IMPLANT
DRAPE LAPAROTOMY T 102X78X121 (DRAPES) ×2 IMPLANT
DRSG PAD ABDOMINAL 8X10 ST (GAUZE/BANDAGES/DRESSINGS) ×2 IMPLANT
ELECT REM PT RETURN 15FT ADLT (MISCELLANEOUS) ×2 IMPLANT
GAUZE 4X4 16PLY RFD (DISPOSABLE) ×2 IMPLANT
GAUZE SPONGE 4X4 12PLY STRL (GAUZE/BANDAGES/DRESSINGS) ×2 IMPLANT
GLOVE ECLIPSE 8.0 STRL XLNG CF (GLOVE) ×2 IMPLANT
GLOVE INDICATOR 8.0 STRL GRN (GLOVE) ×2 IMPLANT
GOWN STRL REUS W/TWL XL LVL3 (GOWN DISPOSABLE) ×4 IMPLANT
KIT BASIN OR (CUSTOM PROCEDURE TRAY) ×2 IMPLANT
KIT TURNOVER KIT A (KITS) IMPLANT
NEEDLE HYPO 22GX1.5 SAFETY (NEEDLE) ×2 IMPLANT
PACK BASIC VI WITH GOWN DISP (CUSTOM PROCEDURE TRAY) ×2 IMPLANT
PENCIL SMOKE EVACUATOR (MISCELLANEOUS) IMPLANT
SUCTION FRAZIER HANDLE 12FR (TUBING)
SUCTION TUBE FRAZIER 12FR DISP (TUBING) IMPLANT
SURGILUBE 2OZ TUBE FLIPTOP (MISCELLANEOUS) ×2 IMPLANT
SUT CHROMIC 2 0 SH (SUTURE) IMPLANT
SUT CHROMIC 3 0 SH 27 (SUTURE) IMPLANT
SUT SILK 2 0 SH (SUTURE) ×3 IMPLANT
SUT VIC AB 2-0 UR6 27 (SUTURE) IMPLANT
SUT VIC AB 3-0 SH 27 (SUTURE) ×2
SUT VIC AB 3-0 SH 27XBRD (SUTURE) IMPLANT
SWAB COLLECTION DEVICE MRSA (MISCELLANEOUS) ×1 IMPLANT
SWAB CULTURE ESWAB REG 1ML (MISCELLANEOUS) ×1 IMPLANT
SYR 20ML LL LF (SYRINGE) ×2 IMPLANT
SYR 3ML LL SCALE MARK (SYRINGE) IMPLANT
SYR BULB IRRIG 60ML STRL (SYRINGE) IMPLANT
TOWEL OR 17X26 10 PK STRL BLUE (TOWEL DISPOSABLE) ×2 IMPLANT
TOWEL OR NON WOVEN STRL DISP B (DISPOSABLE) ×2 IMPLANT
YANKAUER SUCT BULB TIP 10FT TU (MISCELLANEOUS) ×2 IMPLANT

## 2020-08-15 NOTE — Anesthesia Procedure Notes (Signed)
Procedure Name: Intubation Date/Time: 08/15/2020 12:54 PM Performed by: Claudia Desanctis, CRNA Pre-anesthesia Checklist: Patient identified, Emergency Drugs available, Suction available and Patient being monitored Patient Re-evaluated:Patient Re-evaluated prior to induction Oxygen Delivery Method: Circle system utilized Preoxygenation: Pre-oxygenation with 100% oxygen Induction Type: IV induction Ventilation: Mask ventilation without difficulty Laryngoscope Size: 2 and Miller Grade View: Grade I Tube type: Oral Tube size: 7.0 mm Number of attempts: 1 Airway Equipment and Method: Stylet Placement Confirmation: ETT inserted through vocal cords under direct vision,  positive ETCO2 and breath sounds checked- equal and bilateral Secured at: 22 cm Tube secured with: Tape Dental Injury: Teeth and Oropharynx as per pre-operative assessment

## 2020-08-15 NOTE — Transfer of Care (Signed)
Immediate Anesthesia Transfer of Care Note  Patient: Lynn Roberts  Procedure(s) Performed: ANORECTAL EXAM UNDER ANESTHESIA INCISION AND DRAINAGE OF PERIRECTAL ABSCESS x2 WITH PLACEMENT OF SETON (N/A )  Patient Location: PACU  Anesthesia Type:General  Level of Consciousness: awake, alert , oriented and patient cooperative  Airway & Oxygen Therapy: Patient Spontanous Breathing and Patient connected to face mask  Post-op Assessment: Report given to RN and Post -op Vital signs reviewed and stable  Post vital signs: Reviewed and stable  Last Vitals:  Vitals Value Taken Time  BP 155/76 08/15/20 1355  Temp    Pulse 96 08/15/20 1356  Resp 17 08/15/20 1356  SpO2 100 % 08/15/20 1356  Vitals shown include unvalidated device data.  Last Pain:  Vitals:   08/15/20 1157  TempSrc:   PainSc: 10-Worst pain ever      Patients Stated Pain Goal: 2 (22/58/34 6219)  Complications: No complications documented.

## 2020-08-15 NOTE — Anesthesia Postprocedure Evaluation (Signed)
Anesthesia Post Note  Patient: Lynn Roberts  Procedure(s) Performed: ANORECTAL EXAM UNDER ANESTHESIA INCISION AND DRAINAGE OF PERIRECTAL ABSCESS x2 WITH PLACEMENT OF SETON (N/A )     Patient location during evaluation: PACU Anesthesia Type: General Level of consciousness: awake and alert Pain management: pain level controlled Vital Signs Assessment: post-procedure vital signs reviewed and stable Respiratory status: spontaneous breathing, nonlabored ventilation, respiratory function stable and patient connected to nasal cannula oxygen Cardiovascular status: blood pressure returned to baseline and stable Postop Assessment: no apparent nausea or vomiting Anesthetic complications: no   No complications documented.  Last Vitals:  Vitals:   08/15/20 1445 08/15/20 1500  BP: 123/63 115/65  Pulse: 89 82  Resp: 16 16  Temp: 37.1 C 36.7 C  SpO2: 96% 94%    Last Pain:  Vitals:   08/15/20 1542  TempSrc:   PainSc: 3                  Lynn Roberts

## 2020-08-15 NOTE — Anesthesia Preprocedure Evaluation (Addendum)
Anesthesia Evaluation  Patient identified by MRN, date of birth, ID band Patient awake    Reviewed: Allergy & Precautions, NPO status , Patient's Chart, lab work & pertinent test results  Airway Mallampati: II  TM Distance: >3 FB Neck ROM: Full    Dental  (+) Teeth Intact, Dental Advisory Given   Pulmonary sleep apnea , Current Smoker and Patient abstained from smoking.,    Pulmonary exam normal breath sounds clear to auscultation       Cardiovascular Normal cardiovascular exam+ dysrhythmias (atrial tachycardia)  Rhythm:Regular Rate:Normal  TTE 2020 unremarkable   Neuro/Psych  Headaches, negative psych ROS   GI/Hepatic Neg liver ROS, GERD  ,  Endo/Other  negative endocrine ROSObese BMI 37, PCOS  Renal/GU negative Renal ROS  negative genitourinary   Musculoskeletal  (+) Arthritis ,   Abdominal   Peds  Hematology  (+) Blood dyscrasia (Hgb 11.0), anemia ,   Anesthesia Other Findings   Reproductive/Obstetrics                           Anesthesia Physical Anesthesia Plan  ASA: III  Anesthesia Plan: General   Post-op Pain Management:    Induction: Intravenous  PONV Risk Score and Plan: 3 and Midazolam, Dexamethasone and Ondansetron  Airway Management Planned: Oral ETT  Additional Equipment:   Intra-op Plan:   Post-operative Plan: Extubation in OR  Informed Consent: I have reviewed the patients History and Physical, chart, labs and discussed the procedure including the risks, benefits and alternatives for the proposed anesthesia with the patient or authorized representative who has indicated his/her understanding and acceptance.     Dental advisory given  Plan Discussed with: CRNA  Anesthesia Plan Comments:         Anesthesia Quick Evaluation

## 2020-08-15 NOTE — Op Note (Addendum)
08/15/2020  1:53 PM  PATIENT:  Lynn Roberts  34 y.o. female  Patient Care Team: Patient, No Pcp Per as PCP - General ([place]) O'Neal, Cassie Freer, MD as PCP - Cardiology (Cardiology)  PRE-OPERATIVE DIAGNOSIS: Perirectal abscesses  POST-OPERATIVE DIAGNOSIS:  Horse-shoe abscess with trans-sphincteric anal fistula  PROCEDURE:  Modified Hanley procedure (incision and drainage of bilateral ischiorectal fossa abscesses, placement of drains, placement of seton, anorectal exam under anesthesia)  SURGEON:  Surgeon(s): Ileana Roup, MD  ASSISTANT: Obie Dredge, PA-C   ANESTHESIA:   general  SPECIMEN:  Cultures of the wound sent for aerobe, anaerobic and Gram stain  DISPOSITION OF SPECIMEN:  Lab  COUNTS:  Sponge, needle, and instrument counts were reported correct x2 at conclusion.  EBL: 5 mL  Drains: Penrose drains x2; blue vessel loop draining seton x1  PLAN OF CARE: Admit to inpatient   PATIENT DISPOSITION:  PACU - hemodynamically stable.  INDICATION: Ms. Chieffo is a very pleasant 40yoF with bilateral ischiorectal fossa abscesses. She was admitted with my partner overnight with plans for surgery today. We saw and evaluated her and recommended surgery. Please refer to notes elsewhere for details from earlier today regarding these discussions.  OR FINDINGS: Bilateral ischio rectal fossa abscesses.  Posterior midline transsphincteric anal fistula.  Modified Hanley procedure carried out by making counterincisions over both ischiorectal fossa's, draining large volume abscesses, connecting the the posterior midline to the external opening, and controlling the fistula with a blue vessel loop seton. Cultures sent  DESCRIPTION: The patient was identified in the preoperative holding area and taken to the OR. SCDs were applied. She then underwent general endotracheal anesthesia without difficulty. The patient was then rolled onto the OR table in the prone jackknife  position. Pressure points were then evaluated and padded. Benzoin was applied to the buttocks and they were gently taped apart.  She was then prepped and draped in usual sterile fashion.  A surgical timeout was performed indicating the correct patient, procedure, and positioning.  After ascertaining an appropriate level of anesthesia had been achieved, a well lubricated digital rectal exam was performed. This demonstrated No palpable masses but some fullness in the posterior midline.  A Hill-Ferguson anoscope was into the anal canal and circumferential inspection demonstrated relatively normal anoderm and anal canal but there was suggestion of a posterior midline fistula.  There was an external opening with granulation tissue in the posterior midline anal margin.  This was carefully probed and found to indeed have a transsphincteric anal fistula with an internal opening in the posterior midline.  There are bilateral ischio rectal fossa abscesses and these were incised.  Copious amounts of purulent material was drained.  All loculations were broken up.  Cultures were sent.  A posterior midline incision was created accessing the deep postanal space and connecting each counterincision to the midline using Penrose drains.  A blue vessel loop seton was then placed to control the posterior midline transsphincteric anal fistula.  The wounds were irrigated.  A packing piece of moist Kerlix was placed in each of the ischio rectal fossa abscesses.  A dressing consisting of 4 x 4's, ABD, mesh underwear was placed.  She was then taken off the OR table back onto a stretcher, awakened from anesthesia, extubated, transferred to recovery in satisfactory condition.  DISPOSITION: PACU in satisfactory condition.

## 2020-08-15 NOTE — Progress Notes (Signed)
Subjective No acute events. Stable buttock pain.  Objective: Vital signs in last 24 hours: Temp:  [98.7 F (37.1 C)-100.2 F (37.9 C)] 99.9 F (37.7 C) (09/14 0552) Pulse Rate:  [86-96] 91 (09/14 0552) Resp:  [18-20] 18 (09/14 0552) BP: (113-133)/(50-68) 113/50 (09/14 0552) SpO2:  [92 %-98 %] 92 % (09/14 0552) Weight:  [120.5 kg] 120.5 kg (09/13 2100) Last BM Date: 08/13/20  Intake/Output from previous day: 09/13 0701 - 09/14 0700 In: 1599.9 [P.O.:600; I.V.:949; IV Piggyback:50.8] Out: 1700 [Urine:1700] Intake/Output this shift: No intake/output data recorded.  Gen: NAD, comfortable CV: RRR Pulm: Normal work of breathing Abd: Soft, NT/ND Ext: SCDs in place  Lab Results: CBC  Recent Labs    08/14/20 1829 08/15/20 0354  WBC 19.9* 18.4*  HGB 11.5* 11.0*  HCT 36.5 35.0*  PLT 236 199   BMET Recent Labs    08/14/20 1829  NA 137  K 3.1*  CL 99  CO2 25  GLUCOSE 113*  BUN 8  CREATININE 0.53  CALCIUM 8.9   PT/INR Recent Labs    08/14/20 1829  LABPROT 14.4  INR 1.2   ABG No results for input(s): PHART, HCO3 in the last 72 hours.  Invalid input(s): PCO2, PO2  Studies/Results:  Anti-infectives: Anti-infectives (From admission, onward)   Start     Dose/Rate Route Frequency Ordered Stop   08/14/20 2000  piperacillin-tazobactam (ZOSYN) IVPB 3.375 g        3.375 g 12.5 mL/hr over 240 Minutes Intravenous Every 8 hours 08/14/20 1812         Assessment/Plan: Patient Active Problem List   Diagnosis Date Noted  . Perirectal abscess 08/14/2020  . Placenta previa before labor and cesarean delivery without hemorrhage 03/28/2020  . Iron deficiency anemia due to chronic blood loss 03/03/2020  . Papilloma of breast, left 06/08/2014  . Back pain, chronic 08/10/2013  . Diverticulosis 08/10/2013  . Osteoarthritis of knee 08/10/2013  . Sleep apnea 08/10/2013   Lynn Roberts is a very pleasant 88yoF with bilateral ischiorectal fossa abscesses - possible horseshoe  configuration  -The anatomy and physiology of the anal canal was discussed at length with the patient. The pathophysiology of anal abscess and fistula was discussed at length as well. -We have discussed proceeding with incision and drainage for ischiorectal abscesses, possible penrose drain placement, possible seton to obtain source control of the infectious process -The planned procedures, material risks (including, but not limited to, pain, bleeding, infection, scarring, need for blood transfusion, incontinence of gas and/or stool, need for additional procedures, recurrence, pneumonia, heart attack, stroke, death) benefits and alternatives to surgery were discussed at length. I noted a good probability that the procedure would help improve her symptoms. The patient's questions were answered to her satisfaction, she voiced understanding and elected to proceed with surgery. Additionally, we discussed typical postoperative expectations and the recovery process.   LOS: 1 day   Sharon Mt. Dema Severin, M.D. [facility] Use [email or web address] to contact on call provider

## 2020-08-16 ENCOUNTER — Encounter (HOSPITAL_COMMUNITY): Payer: Self-pay | Admitting: Surgery

## 2020-08-16 LAB — CBC
HCT: 33.5 % — ABNORMAL LOW (ref 36.0–46.0)
Hemoglobin: 10.4 g/dL — ABNORMAL LOW (ref 12.0–15.0)
MCH: 25.7 pg — ABNORMAL LOW (ref 26.0–34.0)
MCHC: 31 g/dL (ref 30.0–36.0)
MCV: 82.9 fL (ref 80.0–100.0)
Platelets: 201 10*3/uL (ref 150–400)
RBC: 4.04 MIL/uL (ref 3.87–5.11)
RDW: 15.8 % — ABNORMAL HIGH (ref 11.5–15.5)
WBC: 20.9 10*3/uL — ABNORMAL HIGH (ref 4.0–10.5)
nRBC: 0 % (ref 0.0–0.2)

## 2020-08-16 LAB — URINE CULTURE: Culture: NO GROWTH

## 2020-08-16 MED ORDER — NICOTINE 14 MG/24HR TD PT24
14.0000 mg | MEDICATED_PATCH | Freq: Every day | TRANSDERMAL | Status: DC
  Administered 2020-08-16 – 2020-08-17 (×2): 14 mg via TRANSDERMAL
  Filled 2020-08-16 (×2): qty 1

## 2020-08-16 NOTE — Progress Notes (Signed)
[facility] Progress Note  1 Day Post-Op  Subjective: CC:  Pain significantly improved compared to pre-op, controlled on oral meds. Has not had a BM yet. Tolerating PO. Mobilizing. Asking for foley to come out. Tearfully asking to be able to go outdoors and visit with her daughter. Her husband is at bedside. aski  Objective: Vital signs in last 24 hours: Temp:  [97.5 F (36.4 C)-99 F (37.2 C)] 98 F (36.7 C) (09/15 0427) Pulse Rate:  [53-98] 56 (09/15 0427) Resp:  [15-18] 16 (09/15 0427) BP: (112-157)/(49-79) 112/64 (09/15 0427) SpO2:  [94 %-100 %] 100 % (09/15 0427) Weight:  [120.5 kg] 120.5 kg (09/14 1157) Last BM Date: 08/13/20  Intake/Output from previous day: 09/14 0701 - 09/15 0700 In: 3638.9 [P.O.:1560; I.V.:1854.3; IV Piggyback:224.7] Out: 3960 [Urine:3950; Blood:10] Intake/Output this shift: Total I/O In: 262.2 [P.O.:240; IV Piggyback:22.2] Out: 300 [Urine:300]  PE: Gen:  Alert, NAD, pleasant Pulm:  Normal effort Abd: Soft, non-tender, non-distended Perirectal exam -- penrose drains and seton remain in good position, packing removed without complication/bleeding, significant improvement in induration/cellulitis with small amount residual induration left gluteal cleft.  Skin: warm and dry, no rashes  Psych: A&Ox3   Lab Results:  Recent Labs    08/15/20 0354 08/16/20 0338  WBC 18.4* 20.9*  HGB 11.0* 10.4*  HCT 35.0* 33.5*  PLT 199 201   BMET Recent Labs    08/14/20 1829 08/15/20 0354  NA 137 134*  K 3.1* 3.3*  CL 99 98  CO2 25 24  GLUCOSE 113* 113*  BUN 8 6  CREATININE 0.53 0.58  CALCIUM 8.9 8.3*   PT/INR Recent Labs    08/14/20 1829  LABPROT 14.4  INR 1.2   CMP     Component Value Date/Time   NA 134 (L) 08/15/2020 0354   NA 136 10/12/2019 1030   K 3.3 (L) 08/15/2020 0354   CL 98 08/15/2020 0354   CO2 24 08/15/2020 0354   GLUCOSE 113 (H) 08/15/2020 0354   BUN 6 08/15/2020 0354   BUN 8 10/12/2019 1030   CREATININE  0.58 08/15/2020 0354   CREATININE 0.62 03/03/2020 1452   CALCIUM 8.3 (L) 08/15/2020 0354   PROT 7.4 08/14/2020 1829   ALBUMIN 3.4 (L) 08/14/2020 1829   AST 11 (L) 08/14/2020 1829   AST 10 (L) 03/03/2020 1452   ALT 10 08/14/2020 1829   ALT 10 03/03/2020 1452   ALKPHOS 66 08/14/2020 1829   BILITOT 0.6 08/14/2020 1829   BILITOT 0.4 03/03/2020 1452   GFRNONAA >60 08/15/2020 0354   GFRNONAA >60 03/03/2020 1452   GFRAA >60 08/15/2020 0354   GFRAA >60 03/03/2020 1452   Lipase     Component Value Date/Time   LIPASE 22 10/28/2016 1622   Studies/Results: CT ABDOMEN PELVIS W CONTRAST  Result Date: 08/14/2020 CLINICAL DATA:  34 year old female with 1 week of anal pain. EXAM: CT ABDOMEN AND PELVIS WITH CONTRAST TECHNIQUE: Multidetector CT imaging of the abdomen and pelvis was performed using the standard protocol following bolus administration of intravenous contrast. CONTRAST:  158mL OMNIPAQUE IOHEXOL 300 MG/ML  SOLN COMPARISON:  CT Abdomen and Pelvis 05/18/2009. FINDINGS: Lower chest: Negative. Hepatobiliary: Chronically absent gallbladder.  Negative liver. Pancreas: Negative. Spleen: Negative. Adrenals/Urinary Tract: Normal adrenal glands. Symmetric renal enhancement.  No nephrolithiasis. Urinary bladder is completely decompressed by a Foley catheter, with a small volume of associated gas within the bladder. Stomach/Bowel: Extensive bilateral perianal abnormality detailed below. Upstream of the anal verge there is mild circumferential  rectal wall thickening. The sigmoid appear spared. The sigmoid is redundant, and partially gas distended in the right lower quadrant. The transverse colon is also redundant. The cecum is also redundant and on a lax mesentery, distended in the right upper quadrant today. And I think the ileocecal valve is on series 2, image 53. No definite large bowel inflammation upstream of the rectum. No pneumoperitoneum. There are postoperative changes to the stomach and proximal  small bowel which are new since 2010. Intermittent fluid-filled but nondilated small bowel in both the abdomen and the pelvis. No free fluid in the abdomen or pelvis. Vascular/Lymphatic: Suboptimal intravascular contrast bolus but the major arterial structures appear patent. No atherosclerosis identified. Portal venous system appears to be patent. Reactive appearing bilateral inguinal and external iliac lymph nodes. No other lymphadenopathy. Reproductive: Negative; chronic surgical clips at the left cornua. Other: Mild to moderate inflammatory stranding along the pelvic floor. And surrounding the anal verge there are multiple bilateral an air and/or fluid collections tracking caudally in the medial buttocks (series 2, images 90-94). The largest of these is roughly 57 mm diameter on series 2, image 89. All told, the perianal collections encompass an area of about 10 cm diameter on the right and 8-9 cm on the left. Confluent regional inflammation. But no additional perineum soft tissue gas. Musculoskeletal: Mild levoconvex lumbar scoliosis. No acute osseous abnormality identified. IMPRESSION: 1. Large, multiloculated bilateral perianal abscesses, which may have ruptured into the surrounding soft tissues, and encompass about 10 cm on the right, 8 cm on the left. See series 2, images 89 and 94. 2. Associated inflammation of the rectum and the pelvic floor. Reactive appearing inguinal and external iliac lymph nodes. 3. Upstream redundant and partially dilated large bowel - especially the cecum and the sigmoid colon. And postoperative changes to the stomach and proximal small bowel since 2010. But no mechanical bowel obstruction suspected at this time. 4. No free air or free fluid in the abdomen or pelvis. Electronically Signed   By: Genevie Ann M.D.   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 Dema Severin - POD#1, afebrile, WBC 20 - continue IV abx and follow cultures - packing removed, start sitz paths - nicotine patch ordered per patient request. - will continue foley today, like DC tomorrow given retention prior to surgery   FEN: Regular ID: zosyn 9/14 >>  VTE: SCD's, lovenox Foley: continue, plan TOV 9/16 Dispo: med-surg, IV abx, mobilize   LOS: 2 days    Obie Dredge, [facility] Please see Amion for pager number during day hours 7:00am-4:30pm

## 2020-08-17 LAB — CBC WITH DIFFERENTIAL/PLATELET
Abs Immature Granulocytes: 0.19 10*3/uL — ABNORMAL HIGH (ref 0.00–0.07)
Basophils Absolute: 0.1 10*3/uL (ref 0.0–0.1)
Basophils Relative: 0 %
Eosinophils Absolute: 0.1 10*3/uL (ref 0.0–0.5)
Eosinophils Relative: 1 %
HCT: 32.9 % — ABNORMAL LOW (ref 36.0–46.0)
Hemoglobin: 10.1 g/dL — ABNORMAL LOW (ref 12.0–15.0)
Immature Granulocytes: 1 %
Lymphocytes Relative: 15 %
Lymphs Abs: 2.4 10*3/uL (ref 0.7–4.0)
MCH: 25.3 pg — ABNORMAL LOW (ref 26.0–34.0)
MCHC: 30.7 g/dL (ref 30.0–36.0)
MCV: 82.3 fL (ref 80.0–100.0)
Monocytes Absolute: 0.8 10*3/uL (ref 0.1–1.0)
Monocytes Relative: 5 %
Neutro Abs: 12.1 10*3/uL — ABNORMAL HIGH (ref 1.7–7.7)
Neutrophils Relative %: 78 %
Platelets: 233 10*3/uL (ref 150–400)
RBC: 4 MIL/uL (ref 3.87–5.11)
RDW: 15.8 % — ABNORMAL HIGH (ref 11.5–15.5)
WBC: 15.6 10*3/uL — ABNORMAL HIGH (ref 4.0–10.5)
nRBC: 0 % (ref 0.0–0.2)

## 2020-08-17 MED ORDER — DIPHENHYDRAMINE HCL 25 MG PO CAPS
25.0000 mg | ORAL_CAPSULE | Freq: Four times a day (QID) | ORAL | Status: DC | PRN
  Administered 2020-08-17: 25 mg via ORAL
  Filled 2020-08-17: qty 1

## 2020-08-17 MED ORDER — ONDANSETRON HCL 4 MG/2ML IJ SOLN: 4.0000 mg | Freq: Four times a day (QID) | INTRAMUSCULAR | Status: DC | PRN

## 2020-08-17 MED ORDER — POLYETHYLENE GLYCOL 3350 17 GM/SCOOP PO POWD: 17.0000 g | Freq: Two times a day (BID) | ORAL | 0 refills | Status: AC | PRN

## 2020-08-17 MED ORDER — AMOXICILLIN-POT CLAVULANATE 875-125 MG PO TABS: 1.0000 | ORAL_TABLET | Freq: Two times a day (BID) | ORAL | 0 refills | Status: AC

## 2020-08-17 MED ORDER — AMOXICILLIN-POT CLAVULANATE 875-125 MG PO TABS
1.0000 | ORAL_TABLET | Freq: Two times a day (BID) | ORAL | Status: DC
  Administered 2020-08-17: 1 via ORAL
  Filled 2020-08-17: qty 1

## 2020-08-17 MED ORDER — CALCIUM CARBONATE ANTACID 500 MG PO CHEW
1.0000 | CHEWABLE_TABLET | Freq: Four times a day (QID) | ORAL | Status: DC | PRN
  Administered 2020-08-17 (×2): 200 mg via ORAL
  Filled 2020-08-17 (×2): qty 1

## 2020-08-17 MED ORDER — OXYCODONE HCL 5 MG PO TABS: 5.0000 mg | ORAL_TABLET | Freq: Four times a day (QID) | ORAL | 0 refills | Status: AC | PRN

## 2020-08-17 NOTE — TOC Transition Note (Signed)
Transition of Care [facility]) - CM/SW Discharge Note   Patient Details  Name: Lynn Roberts MRN: [id number] Date of Birth: [birth year]  [facility] CM/SW Contact:  Lennart Pall, LCSW Phone Number: 08/17/2020, 12:33 PM   Clinical Narrative:   Pt for dc today.  3n1 ordered via [employer].    Final next level of care: Home/Self Care Barriers to Discharge: Barriers Resolved   Patient Goals and CMS Choice        Discharge Placement                       Discharge Plan and Services                DME Arranged: 3-N-1 DME Agency: [employer] Date DME Agency Contacted: 08/17/20 Time DME Agency Contacted: 57 Representative spoke with at DME Agency: [place]: NA [place] Agency: NA        Social Determinants of Health ([place]) Interventions     Readmission Risk Interventions No flowsheet data found.

## 2020-08-17 NOTE — Progress Notes (Signed)
[facility] Progress Note  2 Days Post-Op  Subjective: CC:  Pain significantly improved compared to pre-op, Mobilizing.  Objective: Vital signs in last 24 hours: Temp:  [97.8 F (36.6 C)-98 F (36.7 C)] 97.8 F (36.6 C) (09/16 0627) Pulse Rate:  [58-73] 58 (09/16 0627) Resp:  [15-18] 15 (09/16 0627) BP: (125-149)/(68-80) 125/80 (09/16 0627) SpO2:  [99 %-100 %] 100 % (09/16 0627) Last BM Date: 08/13/20  Intake/Output from previous day: 09/15 0701 - 09/16 0700 In: 1012.9 [P.O.:870; IV Piggyback:142.9] Out: 3000 [Urine:3000] Intake/Output this shift: No intake/output data recorded.  PE: Gen:  Alert, NAD, pleasant Pulm:  Normal effort Abd: Soft, non-tender, non-distended Perirectal exam -- penrose drains and seton remain in good position; some scant drainage and induration; much improved overall Skin: warm and dry, no rashes  Psych: A&Ox3   Lab Results:  Recent Labs    08/15/20 0354 08/16/20 0338  WBC 18.4* 20.9*  HGB 11.0* 10.4*  HCT 35.0* 33.5*  PLT 199 201   BMET Recent Labs    08/14/20 1829 08/15/20 0354  NA 137 134*  K 3.1* 3.3*  CL 99 98  CO2 25 24  GLUCOSE 113* 113*  BUN 8 6  CREATININE 0.53 0.58  CALCIUM 8.9 8.3*   PT/INR Recent Labs    08/14/20 1829  LABPROT 14.4  INR 1.2   CMP     Component Value Date/Time   NA 134 (L) 08/15/2020 0354   NA 136 10/12/2019 1030   K 3.3 (L) 08/15/2020 0354   CL 98 08/15/2020 0354   CO2 24 08/15/2020 0354   GLUCOSE 113 (H) 08/15/2020 0354   BUN 6 08/15/2020 0354   BUN 8 10/12/2019 1030   CREATININE 0.58 08/15/2020 0354   CREATININE 0.62 03/03/2020 1452   CALCIUM 8.3 (L) 08/15/2020 0354   PROT 7.4 08/14/2020 1829   ALBUMIN 3.4 (L) 08/14/2020 1829   AST 11 (L) 08/14/2020 1829   AST 10 (L) 03/03/2020 1452   ALT 10 08/14/2020 1829   ALT 10 03/03/2020 1452   ALKPHOS 66 08/14/2020 1829   BILITOT 0.6 08/14/2020 1829   BILITOT 0.4 03/03/2020 1452   GFRNONAA >60 08/15/2020 0354   GFRNONAA  >60 03/03/2020 1452   GFRAA >60 08/15/2020 0354   GFRAA >60 03/03/2020 1452   Lipase     Component Value Date/Time   LIPASE 22 10/28/2016 1622   Studies/Results: No results found. 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 Dema Severin - POD#2, afebrile, WBC pending -  sitz paths - nicotine patch ordered per patient request. - D/C foley today  FEN: Regular ID: zosyn 9/14 >>  VTE: SCD's, lovenox Foley: continue, plan TOV 9/16 Dispo: med-surg, IV abx, mobilize  - if WBC moving in right direction, possible discharge home later today if voiding well on her own - plan for PO abx on discharge    LOS: 3 days   Nadeen Landau, M.D. [facility] Use [email or web address] to contact on call provider

## 2020-08-17 NOTE — Progress Notes (Signed)
Patients catheter had 2 occurrences of blood in collection bag. Patient c/o burning and the sensation that she needs to urinate. Once emptying bag, urine returned to normal color. Oncoming RN made aware.

## 2020-08-17 NOTE — Progress Notes (Signed)
MD paged for nausea and itching prn meds per patient request.

## 2020-08-17 NOTE — Discharge Summary (Signed)
Patient ID: MYKEL MOHL MRN: [id number] DOB/AGE: [birth year] 34 y.o.  Admit date: 08/14/2020 Discharge date: 08/17/2020  Discharge Diagnoses Patient Active Problem List   Diagnosis Date Noted  . Perirectal abscess 08/14/2020  . Placenta previa before labor and cesarean delivery without hemorrhage 03/28/2020  . Iron deficiency anemia due to chronic blood loss 03/03/2020  . Papilloma of breast, left 06/08/2014  . Back pain, chronic 08/10/2013  . Diverticulosis 08/10/2013  . Osteoarthritis of knee 08/10/2013  . Sleep apnea 08/10/2013    Procedures OR 08/15/20 PROCEDURE:  Modified Hanley procedure (incision and drainage of bilateral ischiorectal fossa abscesses, placement of drains, placement of seton, anorectal exam under anesthesia)  Hospital Course: She was admitted postop where she recovered appropriately. Her foley was removed on POD#2. She was voiding spontaneously. Her pain was well controlled on oral analgesics. She felt comfortable with wound care plans. She was deemed stable for discharge home    Allergies as of 08/17/2020      Reactions   Nsaids Other (See Comments)   HX. OF WEIGHT-LOSS SURGERY   Other Rash   Reaction to LOTIONS    Sulfa Antibiotics Rash   Tramadol Rash      Medication List    STOP taking these medications   doxycycline 100 MG capsule Commonly known as: VIBRAMYCIN   hydrocortisone 2.5 % rectal cream Commonly known as: ANUSOL-HC   metroNIDAZOLE 0.75 % gel Commonly known as: METROGEL     TAKE these medications   amoxicillin-clavulanate 875-125 MG tablet Commonly known as: Augmentin Take 1 tablet by mouth 2 (two) times daily for 10 days.   oxyCODONE 5 MG immediate release tablet Commonly known as: Roxicodone Take 1 tablet (5 mg total) by mouth every 6 (six) hours as needed for up to 5 days (postop pain not controlled with tylenol and ibuprofen first).          Sharon Mt. Dema Severin, M.D. [facility]

## 2020-08-17 NOTE — Discharge Instructions (Addendum)
ANORECTAL SURGERY: POST OP INSTRUCTIONS  1. DIET: Follow a light bland diet the first 24 hours after arrival home, such as soup, liquids, crackers, etc.  Be sure to include lots of fluids daily.  Avoid fast food or heavy meals as your are more likely to get nauseated.  Eat a low fat diet the next few days after surgery.   2. Some bleeding with bowel movements is expected for the first couple of days but this should stop in between bowel movements  3. Take your usually prescribed home medications unless otherwise directed.  4. PAIN CONTROL: a. It is helpful to take an over-the-counter pain medication regularly for the first few days/weeks.  Choose from the following that works best for you: i. Ibuprofen (Advil, etc) Three 200mg  tabs every 6 hours as needed. ii. Acetaminophen (Tylenol, etc) 500-650mg  every 6 hours as needed iii. NOTE: You may take both of these medications together - most patients find it most helpful when alternating between the two (i.e. Ibuprofen at 6am, tylenol at 9am, ibuprofen at 12pm ...) b. A  prescription for pain medication may have been prescribed for you at discharge.  Take your pain medication as prescribed.  i. If you are having problems/concerns with the prescription medicine, please call us for further advice.  5. Avoid getting constipated.  Between the surgery and the pain medications, it is common to experience some constipation.  Increasing fluid intake (64oz of water per day) and taking a fiber supplement (such as Metamucil, Citrucel, FiberCon) 1-2 times a day regularly will usually help prevent this problem from occurring.  Take Miralax (over the counter) 1-2x/day while taking a narcotic pain medication. If no bowel movement after 48hours, you may additionally take a laxative like a bottle of Milk of Magnesia which can be purchased over the counter. Avoid enemas if possible as these are often painful.   6. Watch out for diarrhea.  If you have many loose bowel  movements, simplify your diet to bland foods.  Stop any stool softeners and decrease your fiber supplement. If this worsens or does not improve, please call us.  7. Wash / shower every day.   You may shower normally, getting soap/water on your wound, particularly after bowel movements. Dry dressings will help keep the buttocks clean and dry. Moisture in this area will increase possibility for anal itching.  8. Soaking in a warm bath filled a couple inches ("Sitz bath") is a great way to clean the area after a bowel movement and many patients find it is a way to soothe the area.  9. ACTIVITIES as tolerated:   a. You may resume regular (light) daily activities beginning the next day--such as daily self-care, walking, climbing stairs--gradually increasing activities as tolerated.  If you can walk 30 minutes without difficulty, it is safe to try more intense activity such as jogging, treadmill, bicycling, low-impact aerobics, etc. b. Refrain from any heavy lifting or straining for the first 2 weeks after your procedure, particularly if your surgery was for hemorrhoids. c. Avoid activities that make your pain worse d. You may drive when you are no longer taking prescription pain medication, you can comfortably wear a seatbelt, and you can safely maneuver your car and apply brakes.  10. FOLLOW UP in our office a. Please call [facility] at [phone] to set up an appointment to see your surgeon in the office for a follow-up appointment approximately 2 weeks after your surgery. b. Make sure that you call for this  appointment the day you arrive home to insure a convenient appointment time.  9. If you have disability or family leave forms that need to be completed, you may have them completed by your primary care physician's office; for return to work instructions, please ask our office staff and they will be happy to assist you in obtaining this documentation   When to call us [phone]: 1. Poor pain  control 2. Reactions / problems with new medications (rash/itching, etc)  3. Fever over 101.5 F (38.5 C) 4. Inability to urinate 5. Nausea/vomiting 6. Worsening swelling or bruising 7. Continued bleeding from incision. 8. Increased pain, redness, or drainage from the incision  The clinic staff is available to answer your questions during regular business hours (8:30am-5pm).  Please don't hesitate to call and ask to speak to one of our nurses for clinical concerns.   A surgeon from [facility] is always on call at the hospitals   If you have a medical emergency, go to the nearest emergency room or call 911.   [facility], [place], [place], [place], [place]  [id number] ? MAIN: [phone] FAX [phone] [email or web address]   How to Take a [employer] can also shower and get soap and water into the open sites. A sitz bath is a warm water bath that may be used to care for your rectum, genital area, or the area between your rectum and genitals (perineum). For a sitz bath, the water only comes up to your hips and covers your buttocks. A sitz bath may done at home in a bathtub or with a portable sitz bath that fits over the toilet. Your health care provider may recommend a sitz bath to help:  Relieve pain and discomfort after delivering a baby.  Relieve pain and itching from hemorrhoids or anal fissures.  Relieve pain after certain surgeries.  Relax muscles that are sore or tight. How to take a sitz bath Take 3-4 sitz baths a day, or as many as told by your health care provider. Bathtub sitz bath To take a sitz bath in a bathtub: 1. Partially fill a bathtub with warm water. The water should be deep enough to cover your hips and buttocks when you are sitting in the tub. 2. If your health care provider told you to put medicine in the water, follow his or her instructions. 3. Sit in the water. 4. Open the tub drain a little,  and leave it open during your bath. 5. Turn on the warm water again, enough to replace the water that is draining out. Keep the water running throughout your bath. This helps keep the water at the right level and the right temperature. 6. Soak in the water for 15-20 minutes, or as long as told by your health care provider. 7. When you are done, be careful when you stand up. You may feel dizzy. 8. After the sitz bath, pat yourself dry. Do not rub your skin to dry it.  Over-the-toilet sitz bath To take a sitz bath with an over-the-toilet basin: 1. Follow the manufacturer's instructions. 2. Fill the basin with warm water. 3. If your health care provider told you to put medicine in the water, follow his or her instructions. 4. Sit on the seat. Make sure the water covers your buttocks and perineum. 5. Soak in the water for 15-20 minutes, or as long as told by your health care provider. 6. After the sitz bath, pat  yourself dry. Do not rub your skin to dry it. 7. Clean and dry the basin between uses. 8. Discard the basin if it cracks, or according to the manufacturer's instructions. Contact a health care provider if:  Your symptoms get worse. Do not continue with sitz baths if your symptoms get worse.  You have new symptoms. If this happens, do not continue with sitz baths until you talk with your health care provider. Summary  A sitz bath is a warm water bath in which the water only comes up to your hips and covers your buttocks.  A sitz bath may help relieve itching, relieve pain, and relax muscles that are sore or tight in the lower part of your body, including your genital area.  Take 3-4 sitz baths a day, or as many as told by your health care provider. Soak in the water for 15-20 minutes.  Do not continue with sitz baths if your symptoms get worse. This information is not intended to replace advice given to you by your health care provider. Make sure you discuss any questions you have  with your health care provider. Document Revised: 04/19/2019 Document Reviewed: 11/20/2017 Elsevier Patient Education  [place].

## 2020-08-19 LAB — AEROBIC/ANAEROBIC CULTURE W GRAM STAIN (SURGICAL/DEEP WOUND)

## 2020-08-19 LAB — CULTURE, BLOOD (ROUTINE X 2)
Culture: NO GROWTH
Culture: NO GROWTH
Special Requests: ADEQUATE

## 2020-11-30 ENCOUNTER — Encounter (HOSPITAL_BASED_OUTPATIENT_CLINIC_OR_DEPARTMENT_OTHER): Payer: Self-pay | Admitting: Surgery

## 2020-12-05 ENCOUNTER — Other Ambulatory Visit: Payer: Self-pay

## 2020-12-05 ENCOUNTER — Encounter (HOSPITAL_BASED_OUTPATIENT_CLINIC_OR_DEPARTMENT_OTHER): Payer: Self-pay | Admitting: Surgery

## 2020-12-05 ENCOUNTER — Other Ambulatory Visit (HOSPITAL_COMMUNITY)
Admission: RE | Admit: 2020-12-05 | Discharge: 2020-12-05 | Disposition: A | Discharge status: Home / Self Care | Payer: No Typology Code available for payment source | Source: Ambulatory Visit | Attending: Surgery | Admitting: Surgery

## 2020-12-05 DIAGNOSIS — Z20822 Contact with and (suspected) exposure to covid-19: Secondary | ICD-10-CM | POA: Diagnosis not present

## 2020-12-05 DIAGNOSIS — Z01812 Encounter for preprocedural laboratory examination: Secondary | ICD-10-CM | POA: Diagnosis not present

## 2020-12-05 NOTE — Progress Notes (Signed)
Spoke w/ via phone for pre-op interview---pt Lab needs dos----  Urine poct            Lab results------ see below COVID test ------12-05-2020 1400 Arrive at -------730 -Am 12-08-2020 NPO after MN NO Solid Food.  Clear liquids from MN until---630 am then npo Medications to take morning of surgery -----none Diabetic medication -----n/a Patient Special Instructions -----none Pre-Op special Istructions -----none Patient verbalized understanding of instructions that were given at this phone interview. Patient denies shortness of breath, chest pain, fever, cough at this phone interview.  lov cardiology hao menh pa 02-17-2020 epic no further follow up needed, lov dr Cecilie Kicks cardiology 09-12-2019  epic Echo 10-21-2019 epicmonitor report 10-29-2019 epic ekg 01-25-2020 epic

## 2020-12-06 LAB — SARS CORONAVIRUS 2 (TAT 6-24 HRS): SARS Coronavirus 2: NEGATIVE

## 2020-12-08 ENCOUNTER — Encounter (HOSPITAL_BASED_OUTPATIENT_CLINIC_OR_DEPARTMENT_OTHER): Payer: Self-pay | Admitting: Surgery

## 2020-12-08 ENCOUNTER — Other Ambulatory Visit: Payer: Self-pay

## 2020-12-08 ENCOUNTER — Encounter (HOSPITAL_BASED_OUTPATIENT_CLINIC_OR_DEPARTMENT_OTHER)
Admission: RE | Disposition: A | Discharge status: Home / Self Care | Payer: Self-pay | Source: Home / Self Care | Attending: Surgery

## 2020-12-08 ENCOUNTER — Ambulatory Visit (HOSPITAL_BASED_OUTPATIENT_CLINIC_OR_DEPARTMENT_OTHER): Payer: No Typology Code available for payment source | Admitting: Anesthesiology

## 2020-12-08 ENCOUNTER — Ambulatory Visit (HOSPITAL_BASED_OUTPATIENT_CLINIC_OR_DEPARTMENT_OTHER)
Admission: RE | Admit: 2020-12-08 | Discharge: 2020-12-08 | Disposition: A | Discharge status: Home / Self Care | Payer: No Typology Code available for payment source | Attending: Surgery | Admitting: Surgery

## 2020-12-08 DIAGNOSIS — K603 Anal fistula: Secondary | ICD-10-CM | POA: Diagnosis not present

## 2020-12-08 DIAGNOSIS — R69 Illness, unspecified: Secondary | ICD-10-CM | POA: Diagnosis not present

## 2020-12-08 DIAGNOSIS — D500 Iron deficiency anemia secondary to blood loss (chronic): Secondary | ICD-10-CM | POA: Diagnosis not present

## 2020-12-08 DIAGNOSIS — G4730 Sleep apnea, unspecified: Secondary | ICD-10-CM | POA: Diagnosis not present

## 2020-12-08 DIAGNOSIS — G43909 Migraine, unspecified, not intractable, without status migrainosus: Secondary | ICD-10-CM | POA: Diagnosis not present

## 2020-12-08 DIAGNOSIS — F17210 Nicotine dependence, cigarettes, uncomplicated: Secondary | ICD-10-CM | POA: Diagnosis not present

## 2020-12-08 HISTORY — DX: Unspecified hemorrhoids: K64.9

## 2020-12-08 HISTORY — DX: Personal history of other diseases of the nervous system and sense organs: Z86.69

## 2020-12-08 HISTORY — PX: PLACEMENT OF SETON: SHX6029

## 2020-12-08 HISTORY — PX: RECTAL EXAM UNDER ANESTHESIA: SHX6399

## 2020-12-08 LAB — POCT PREGNANCY, URINE: Preg Test, Ur: NEGATIVE

## 2020-12-08 SURGERY — EXAM UNDER ANESTHESIA, RECTUM
Anesthesia: General | Site: Rectum

## 2020-12-08 MED ORDER — LACTATED RINGERS IV SOLN: INTRAVENOUS | Status: DC

## 2020-12-08 MED ORDER — LIDOCAINE HCL (PF) 2 % IJ SOLN
INTRAMUSCULAR | Status: AC
  Filled 2020-12-08: qty 5

## 2020-12-08 MED ORDER — DEXAMETHASONE SODIUM PHOSPHATE 10 MG/ML IJ SOLN
INTRAMUSCULAR | Status: AC
  Filled 2020-12-08: qty 1

## 2020-12-08 MED ORDER — PROPOFOL 10 MG/ML IV BOLUS
INTRAVENOUS | Status: AC
  Filled 2020-12-08: qty 40

## 2020-12-08 MED ORDER — FENTANYL CITRATE (PF) 100 MCG/2ML IJ SOLN: 25.0000 ug | INTRAMUSCULAR | Status: DC | PRN

## 2020-12-08 MED ORDER — FENTANYL CITRATE (PF) 100 MCG/2ML IJ SOLN
INTRAMUSCULAR | Status: AC
  Filled 2020-12-08: qty 2

## 2020-12-08 MED ORDER — PROPOFOL 10 MG/ML IV BOLUS
INTRAVENOUS | Status: DC | PRN
  Administered 2020-12-08: 200 mg via INTRAVENOUS

## 2020-12-08 MED ORDER — DIBUCAINE (PERIANAL) 1 % EX OINT
TOPICAL_OINTMENT | CUTANEOUS | Status: DC | PRN
  Administered 2020-12-08: 1 via RECTAL

## 2020-12-08 MED ORDER — ONDANSETRON HCL 4 MG/2ML IJ SOLN
INTRAMUSCULAR | Status: AC
  Filled 2020-12-08: qty 2

## 2020-12-08 MED ORDER — ROCURONIUM BROMIDE 10 MG/ML (PF) SYRINGE
PREFILLED_SYRINGE | INTRAVENOUS | Status: DC | PRN
  Administered 2020-12-08: 50 mg via INTRAVENOUS

## 2020-12-08 MED ORDER — OXYCODONE HCL 5 MG PO TABS: 5.0000 mg | ORAL_TABLET | Freq: Four times a day (QID) | ORAL | 0 refills | Status: AC | PRN

## 2020-12-08 MED ORDER — DEXAMETHASONE SODIUM PHOSPHATE 10 MG/ML IJ SOLN
INTRAMUSCULAR | Status: DC | PRN
  Administered 2020-12-08: 5 mg via INTRAVENOUS

## 2020-12-08 MED ORDER — LIDOCAINE 2% (20 MG/ML) 5 ML SYRINGE
INTRAMUSCULAR | Status: DC | PRN
  Administered 2020-12-08: 100 mg via INTRAVENOUS

## 2020-12-08 MED ORDER — ACETAMINOPHEN 500 MG PO TABS
1000.0000 mg | ORAL_TABLET | ORAL | Status: AC
  Administered 2020-12-08: 1000 mg via ORAL

## 2020-12-08 MED ORDER — BUPIVACAINE-EPINEPHRINE 0.25% -1:200000 IJ SOLN
INTRAMUSCULAR | Status: DC | PRN
  Administered 2020-12-08: 30 mL

## 2020-12-08 MED ORDER — SUGAMMADEX SODIUM 200 MG/2ML IV SOLN
INTRAVENOUS | Status: DC | PRN
  Administered 2020-12-08: 250 mg via INTRAVENOUS

## 2020-12-08 MED ORDER — BUPIVACAINE LIPOSOME 1.3 % IJ SUSP: 20.0000 mL | Freq: Once | INTRAMUSCULAR | Status: DC

## 2020-12-08 MED ORDER — MIDAZOLAM HCL 2 MG/2ML IJ SOLN
INTRAMUSCULAR | Status: AC
  Filled 2020-12-08: qty 2

## 2020-12-08 MED ORDER — SUGAMMADEX SODIUM 500 MG/5ML IV SOLN
INTRAVENOUS | Status: AC
  Filled 2020-12-08: qty 5

## 2020-12-08 MED ORDER — ONDANSETRON HCL 4 MG/2ML IJ SOLN
INTRAMUSCULAR | Status: DC | PRN
  Administered 2020-12-08: 4 mg via INTRAVENOUS

## 2020-12-08 MED ORDER — ACETAMINOPHEN 500 MG PO TABS
ORAL_TABLET | ORAL | Status: AC
  Filled 2020-12-08: qty 2

## 2020-12-08 MED ORDER — CHLORHEXIDINE GLUCONATE CLOTH 2 % EX PADS: 6.0000 | MEDICATED_PAD | Freq: Once | CUTANEOUS | Status: DC

## 2020-12-08 MED ORDER — MIDAZOLAM HCL 2 MG/2ML IJ SOLN
INTRAMUSCULAR | Status: DC | PRN
  Administered 2020-12-08: 2 mg via INTRAVENOUS

## 2020-12-08 MED ORDER — OXYCODONE HCL 5 MG PO TABS: 5.0000 mg | ORAL_TABLET | Freq: Once | ORAL | Status: DC | PRN

## 2020-12-08 MED ORDER — ROCURONIUM BROMIDE 10 MG/ML (PF) SYRINGE
PREFILLED_SYRINGE | INTRAVENOUS | Status: AC
  Filled 2020-12-08: qty 10

## 2020-12-08 MED ORDER — OXYCODONE HCL 5 MG/5ML PO SOLN: 5.0000 mg | Freq: Once | ORAL | Status: DC | PRN

## 2020-12-08 MED ORDER — FENTANYL CITRATE (PF) 100 MCG/2ML IJ SOLN
INTRAMUSCULAR | Status: DC | PRN
  Administered 2020-12-08 (×2): 50 ug via INTRAVENOUS

## 2020-12-08 MED ORDER — BUPIVACAINE LIPOSOME 1.3 % IJ SUSP
INTRAMUSCULAR | Status: DC | PRN
  Administered 2020-12-08: 20 mL

## 2020-12-08 MED ORDER — PROMETHAZINE HCL 25 MG/ML IJ SOLN: 6.2500 mg | INTRAMUSCULAR | Status: DC | PRN

## 2020-12-08 SURGICAL SUPPLY — 66 items
APL SKNCLS STERI-STRIP NONHPOA (GAUZE/BANDAGES/DRESSINGS) ×2
BENZOIN TINCTURE PRP APPL 2/3 (GAUZE/BANDAGES/DRESSINGS) ×4 IMPLANT
BLADE EXTENDED COATED 6.5IN (ELECTRODE) IMPLANT
BLADE HEX COATED 2.75 (ELECTRODE) IMPLANT
BLADE SURG 10 STRL SS (BLADE) IMPLANT
BLADE SURG 15 STRL LF DISP TIS (BLADE) ×1 IMPLANT
BLADE SURG 15 STRL SS (BLADE) ×2
BRIEF STRETCH FOR OB PAD LRG (UNDERPADS AND DIAPERS) ×2 IMPLANT
CANISTER SUCT 3000ML PPV (MISCELLANEOUS) ×2 IMPLANT
COVER BACK TABLE 60X90IN (DRAPES) ×2 IMPLANT
COVER MAYO STAND STRL (DRAPES) ×3 IMPLANT
COVER WAND RF STERILE (DRAPES) ×2 IMPLANT
DECANTER SPIKE VIAL GLASS SM (MISCELLANEOUS) ×2 IMPLANT
DRAPE HYSTEROSCOPY (MISCELLANEOUS) IMPLANT
DRAPE LAPAROTOMY 100X72 PEDS (DRAPES) ×2 IMPLANT
DRAPE SHEET LG 3/4 BI-LAMINATE (DRAPES) IMPLANT
DRAPE UTILITY XL STRL (DRAPES) ×2 IMPLANT
DRSG PAD ABDOMINAL 8X10 ST (GAUZE/BANDAGES/DRESSINGS) ×2 IMPLANT
GAUZE SPONGE 4X4 12PLY STRL (GAUZE/BANDAGES/DRESSINGS) ×2 IMPLANT
GAUZE SPONGE 4X4 12PLY STRL LF (GAUZE/BANDAGES/DRESSINGS) ×1 IMPLANT
GLOVE BIO SURGEON STRL SZ7.5 (GLOVE) ×2 IMPLANT
GLOVE INDICATOR 8.0 STRL GRN (GLOVE) ×2 IMPLANT
GOWN STRL REUS W/ TWL XL LVL3 (GOWN DISPOSABLE) ×1 IMPLANT
GOWN STRL REUS W/TWL XL LVL3 (GOWN DISPOSABLE) ×2
HYDROGEN PEROXIDE 16OZ (MISCELLANEOUS) IMPLANT
IV CATH 14GX2 1/4 (CATHETERS) IMPLANT
IV CATH 18G SAFETY (IV SOLUTION) IMPLANT
KIT SIGMOIDOSCOPE (SET/KITS/TRAYS/PACK) IMPLANT
KIT TURNOVER CYSTO (KITS) ×2 IMPLANT
LEGGING LITHOTOMY PAIR STRL (DRAPES) IMPLANT
LOOP VESSEL MAXI BLUE (MISCELLANEOUS) ×1 IMPLANT
NDL HYPO 25X1 1.5 SAFETY (NEEDLE) IMPLANT
NDL SAFETY ECLIPSE 18X1.5 (NEEDLE) IMPLANT
NEEDLE HYPO 18GX1.5 SHARP (NEEDLE)
NEEDLE HYPO 22GX1.5 SAFETY (NEEDLE) ×2 IMPLANT
NEEDLE HYPO 25X1 1.5 SAFETY (NEEDLE) IMPLANT
NS IRRIG 500ML POUR BTL (IV SOLUTION) ×2 IMPLANT
PACK BASIN DAY SURGERY FS (CUSTOM PROCEDURE TRAY) ×2 IMPLANT
PENCIL SMOKE EVACUATOR (MISCELLANEOUS) ×2 IMPLANT
SPONGE HEMORRHOID 8X3CM (HEMOSTASIS) IMPLANT
SPONGE SURGIFOAM ABS GEL 12-7 (HEMOSTASIS) IMPLANT
SUCTION FRAZIER HANDLE 10FR (MISCELLANEOUS)
SUCTION TUBE FRAZIER 10FR DISP (MISCELLANEOUS) IMPLANT
SUT CHROMIC 2 0 SH (SUTURE) IMPLANT
SUT CHROMIC 3 0 SH 27 (SUTURE) IMPLANT
SUT MNCRL AB 4-0 PS2 18 (SUTURE) IMPLANT
SUT SILK 0 PSL NDL (SUTURE) IMPLANT
SUT SILK 0 TIES 10X30 (SUTURE) IMPLANT
SUT SILK 2 0 (SUTURE) ×2
SUT SILK 2-0 18XBRD TIE 12 (SUTURE) IMPLANT
SUT VIC AB 2-0 SH 27 (SUTURE)
SUT VIC AB 2-0 SH 27XBRD (SUTURE) IMPLANT
SUT VIC AB 3-0 SH 18 (SUTURE) IMPLANT
SUT VIC AB 3-0 SH 27 (SUTURE)
SUT VIC AB 3-0 SH 27X BRD (SUTURE) IMPLANT
SUT VIC AB 3-0 SH 27XBRD (SUTURE) IMPLANT
SUT VIC AB 4-0 P-3 18XBRD (SUTURE) IMPLANT
SUT VIC AB 4-0 P3 18 (SUTURE)
SYR 20ML LL LF (SYRINGE) IMPLANT
SYR BULB IRRIG 60ML STRL (SYRINGE) ×2 IMPLANT
SYR CONTROL 10ML LL (SYRINGE) ×2 IMPLANT
SYR TB 1ML LL NO SAFETY (SYRINGE) IMPLANT
TOWEL OR 17X26 10 PK STRL BLUE (TOWEL DISPOSABLE) ×2 IMPLANT
TRAY DSU PREP LF (CUSTOM PROCEDURE TRAY) ×2 IMPLANT
TUBE CONNECTING 12X1/4 (SUCTIONS) ×2 IMPLANT
YANKAUER SUCT BULB TIP NO VENT (SUCTIONS) ×2 IMPLANT

## 2020-12-08 NOTE — H&P (Signed)
CC: Here today for surgery  HPI: Ms. Grogan is a very pleasant 35yoF who presented to the hospital 9/13/T1 with large perirectal abscess involving both issue rectal fossa. She was admitted and started on antibiotics. She was taken to the operating room by myself on 08/15/20 where she underwent a modified Hanley procedure. Found to have large bilateral issue rectal fossa abscesses. Posterior midline transsphincteric fistula. A draining blue vessel loop seton was placed in the posterior midline anal fistula. She was admitted postoperatively where she recovered well. She was discharged home 9/16/T1. She has been doing well at home and completed a course of Augmentin. She is here today for follow-up. She denies any complaints. She reports the drainage from her drains has been steadily decreasing. Denies fever/chills or any significant perianal pain.  INTERVAL HX She has been doing well. No complaints. Wounds remain closed with draining seton remaining. Delphina Cahill has remained relatively superficial. She denies any changes in her health or health history since we met in the office. States she is ready for surgery  PMH: Denies  PSH: Duodenal switch 2015 at [facility]. Bilateral breast surgeries, C-sx x2; cholecystectomy; cardiac ablation for PAT as teenager  FHx: Denies FHx of colorectal, breast, endometrial, ovarian or cervical cancer  Social: Denies use of tobacco/EtOH/drugs  ROS: A comprehensive 10 system review of systems was completed with the patient and pertinent findings as noted above.  Past Medical History:  Diagnosis Date  . Arthritis of low back   . Complication of anesthesia    is of [name] descent  . Family history of adverse reaction to anesthesia    pt is a Lumbi [name]  . GERD (gastroesophageal reflux disease)    none since pregnancy   . Hemorrhoids   . History of PAT (paroxysmal atrial tachycardia)    had cardiac ablation as a teenager  . History of sleep apnea    none  since 450 pound weight loss in 2015  . Migraines   . PCOD (polycystic ovarian disease)    no menses    Past Surgical History:  Procedure Laterality Date  . BREAST LUMPECTOMY Left 2011   papilloma  . BREAST LUMPECTOMY Left 2015   papilloma  . BREAST LUMPECTOMY WITH RADIOACTIVE SEED LOCALIZATION Left 10/18/2014   Procedure:  RADIOACTIVE SEED GUIDED EXCISIONAL BREAST BIOPSY;  Surgeon: Alphonsa Overall, MD;  Location: [place];  Service: General;  Laterality: Left;  . BREAST SURGERY Right 07/27/2004   retro-areolar dissection with exc. of large duct  . CARDIAC ELECTROPHYSIOLOGY MAPPING AND ABLATION  sge 13  . CARDIAC ELECTROPHYSIOLOGY STUDY AND ABLATION  age 17  . CESAREAN SECTION N/A 03/28/2020   Procedure: CESAREAN SECTION;  Surgeon: Deliah Boston, MD;  Location: [facility];  Service: Obstetrics;  Laterality: N/A;  RNFA [place]  . CESAREAN SECTION  03/2018  . CHOLECYSTECTOMY  04/27/2008  . GASTROPLASTY DUODENAL SWITCH  01/2014   done at [place]  . INCISION AND DRAINAGE PERIRECTAL ABSCESS N/A 08/15/2020   Procedure: ANORECTAL EXAM UNDER ANESTHESIA INCISION AND DRAINAGE OF PERIRECTAL ABSCESS x2 WITH PLACEMENT OF SETON;  Surgeon: Ileana Roup, MD;  Location: [facility];  Service: General;  Laterality: N/A;  . OVARIAN CYST REMOVAL Bilateral 1999  . PLACEMENT OF SETON  08/15/2020  . TOENAIL EXCISION  yrs ago  . UNILATERAL SALPINGECTOMY      Family History  Problem Relation Age of Onset  . Cancer Paternal Grandfather   . Cancer Maternal Grandmother   . Heart disease  Maternal Grandmother   . Diabetes Maternal Grandfather   . Hypertension Father   . Hyperlipidemia Father   . Hypertension Mother   . Diabetes Mother     Social:  reports that she has been smoking cigarettes. She has a 5.00 pack-year smoking history. She has never used smokeless tobacco. She reports that she does not drink alcohol and does not use drugs.  Allergies:  Allergies  Allergen Reactions  .  Nsaids Other (See Comments)    HX. OF WEIGHT-LOSS SURGERY   . Other Rash    Reaction to LOTIONS   . Sulfa Antibiotics Rash  . Tramadol Rash    Medications: I have reviewed the patient's current medications.  Results for orders placed or performed during the hospital encounter of 12/08/20 (from the past 48 hour(s))  Pregnancy, urine POC     Status: None   Collection Time: 12/08/20  7:41 AM  Result Value Ref Range   Preg Test, Ur NEGATIVE NEGATIVE    Comment:        THE SENSITIVITY OF THIS METHODOLOGY IS >24 mIU/mL     No results found.  ROS - all of the below systems have been reviewed with the patient and positives are indicated with bold text General: chills, fever or night sweats Eyes: blurry vision or double vision ENT: epistaxis or sore throat Allergy/Immunology: itchy/watery eyes or nasal congestion Hematologic/Lymphatic: bleeding problems, blood clots or swollen lymph nodes Endocrine: temperature intolerance or unexpected weight changes Breast: new or changing breast lumps or nipple discharge Resp: cough, shortness of breath, or wheezing CV: chest pain or dyspnea on exertion GI: as per HPI GU: dysuria, trouble voiding, or hematuria MSK: joint pain or joint stiffness Neuro: TIA or stroke symptoms Derm: pruritus and skin lesion changes Psych: anxiety and depression  PE Blood pressure (!) 159/96, pulse 66, temperature (!) 97 F (36.1 C), temperature source Oral, resp. rate 18, height $RemoveBe'5\' 11"'IoMIyqXeI$  (1.803 m), weight 121.4 kg, last menstrual period 11/21/2020, SpO2 100 %. Constitutional: NAD; conversant; wearing mask Eyes: Moist conjunctiva; anicteric Neck: Trachea midline; no thyromegaly Lungs: Normal respiratory effort CV: RRR; no pitting edema MSK: Normal range of motion of extremities Psychiatric: Appropriate affect; alert and oriented x3  Results for orders placed or performed during the hospital encounter of 12/08/20 (from the past 48 hour(s))  Pregnancy, urine  POC     Status: None   Collection Time: 12/08/20  7:41 AM  Result Value Ref Range   Preg Test, Ur NEGATIVE NEGATIVE    Comment:        THE SENSITIVITY OF THIS METHODOLOGY IS >24 mIU/mL     A/P: Ms. Henderson is a very pleasant 89yoF with hx of horseshoe abscess - underwent modified Hanley procedure 08/15/20 - here for surgery  -She is doing well; gluteal incisions/abscess cavities have completely closed now -The anatomy and physiology of the anal canal has been reviewed.  -She denies any issues with incontinence - including to gas, liquid or solid stool -Given how her seton has migrated to now involving minimal sphincter muscle and is now an intersphincteric fistula at 'best' with minimal involvement of her sphincter apparatus, we discussed anorectal exam under anesthesia with likely placement of cutting seton. I do not believe she'll be a candidate for a LIFT given its more external location but we discussed seeing how things looked. Additionally, due to the amount of scarring she has in her anal canal from her horseshoe, would preclude her from being a candidate for a  cutaneous anal advancement or endorectal advancement flap as these tissues are quite scarred and will almost certainly fail. We therefore discussed potential for proceeding with cutting seton placement. -The planned procedures, material risks (including, but not limited to, pain, bleeding, infection, scarring, need for blood transfusion, damage to anal sphincter, incontinence of gas and/or stool, need for additional procedures, recurrence, pneumonia, heart attack, stroke, death) benefits and alternatives to surgery were discussed at length. The patient's questions were answered to her satisfaction, she voiced understanding and elected to proceed with surgery. Additionally, we discussed typical postoperative expectations and the recovery process with a cutting seton.  Nadeen Landau, MD [facility] Use  [email or web address] to contact on call provider

## 2020-12-08 NOTE — Anesthesia Preprocedure Evaluation (Addendum)
Anesthesia Evaluation  Patient identified by MRN, date of birth, ID band Patient awake    Reviewed: Allergy & Precautions, NPO status , Patient's Chart, lab work & pertinent test results  History of Anesthesia Complications (+) Family history of anesthesia reaction and history of anesthetic complications (Lum[name], prone to MH-susceptible myopathy)  Airway Mallampati: I  TM Distance: >3 FB Neck ROM: Full    Dental  (+) Dental Advisory Given, Teeth Intact   Pulmonary sleep apnea (no longer on cpap) , Current SmokerPatient did not abstain from smoking.,    Pulmonary exam normal        Cardiovascular Normal cardiovascular exam+ dysrhythmias (PAT, s/p ablation)      Neuro/Psych  Headaches, negative psych ROS   GI/Hepatic Neg liver ROS, GERD  Controlled, S/p weight-loss surgery    Endo/Other   Obesity   Renal/GU negative Renal ROS     Musculoskeletal  (+) Arthritis , Osteoarthritis,    Abdominal (+) + obese,   Peds  Hematology negative hematology ROS (+)   Anesthesia Other Findings Covid test negative   Reproductive/Obstetrics  PCOS                             Anesthesia Physical Anesthesia Plan  ASA: III  Anesthesia Plan: General   Post-op Pain Management:    Induction: Intravenous  PONV Risk Score and Plan: 3 and Treatment may vary due to age or medical condition, Ondansetron, Dexamethasone and Midazolam  Airway Management Planned: Oral ETT  Additional Equipment: None  Intra-op Plan:   Post-operative Plan: Extubation in OR  Informed Consent: I have reviewed the patients History and Physical, chart, labs and discussed the procedure including the risks, benefits and alternatives for the proposed anesthesia with the patient or authorized representative who has indicated his/her understanding and acceptance.     Dental advisory given  Plan Discussed with: CRNA and  Anesthesiologist  Anesthesia Plan Comments:        Anesthesia Quick Evaluation

## 2020-12-08 NOTE — Anesthesia Postprocedure Evaluation (Signed)
Anesthesia Post Note  Patient: Lynn Roberts  Procedure(s) Performed: ANORECTAL EXAM UNDER ANESTHESIA (N/A Rectum) PLACEMENT OF CUTTING SETON; PARTIAL FISTULOTOMY (N/A Rectum)     Patient location during evaluation: PACU Anesthesia Type: General Level of consciousness: awake and alert Pain management: pain level controlled Vital Signs Assessment: post-procedure vital signs reviewed and stable Respiratory status: spontaneous breathing, nonlabored ventilation and respiratory function stable Cardiovascular status: blood pressure returned to baseline and stable Postop Assessment: no apparent nausea or vomiting Anesthetic complications: no   No complications documented.  Last Vitals:  Vitals:   12/08/20 1045 12/08/20 1100  BP: (!) 153/75 (!) 143/73  Pulse: 76 72  Resp: 15 20  Temp:    SpO2: 95% 96%    Last Pain:  Vitals:   12/08/20 1100  TempSrc:   PainSc: 0-No pain                 Audry Pili

## 2020-12-08 NOTE — Transfer of Care (Signed)
Immediate Anesthesia Transfer of Care Note  Patient: Lynn Roberts  Procedure(s) Performed: Procedure(s) (LRB): ANORECTAL EXAM UNDER ANESTHESIA (N/A) PLACEMENT OF CUTTING SETON; PARTIAL FISTULOTOMY (N/A)  Patient Location: PACU  Anesthesia Type: General  Level of Consciousness: awake, oriented, sedated and patient cooperative  Airway & Oxygen Therapy: Patient Spontanous Breathing and Patient connected to face mask oxygen  Post-op Assessment: Report given to PACU RN and Post -op Vital signs reviewed and stable  Post vital signs: Reviewed and stable  Complications: No apparent anesthesia complications  Last Vitals:  Vitals Value Taken Time  BP 149/75 12/08/20 1033  Temp    Pulse 75 12/08/20 1035  Resp 17 12/08/20 1035  SpO2 96 % 12/08/20 1035  Vitals shown include unvalidated device data.  Last Pain:  Vitals:   12/08/20 0740  TempSrc: Oral  PainSc: 5       Patients Stated Pain Goal: 6 (94/50/38 8828)  Complications: No complications documented.

## 2020-12-08 NOTE — Op Note (Signed)
12/08/2020  10:18 AM  PATIENT:  Lynn Roberts  35 y.o. female  Patient Care Team: Patient, No Pcp Per as PCP - General (General Practice) O'Neal, Cassie Freer, MD as PCP - Cardiology (Cardiology)  PRE-OPERATIVE DIAGNOSIS:  Anal fistula; history of horseshoe fistula/abscess  POST-OPERATIVE DIAGNOSIS:  Same  PROCEDURE:   1. Placement of cutting seton 2. Partial fistulotomy 3. Anorectal exam under anesthesia  SURGEON:  Surgeon(s): Ileana Roup, MD  ASSISTANT: OR staff   ANESTHESIA:   local and general  SPECIMEN:  None  DISPOSITION OF SPECIMEN:  N/A  COUNTS:  Sponge, needle, and instrument counts were reported correct x2 at conclusion.  EBL: 1 mL  Drains: None  PLAN OF CARE: Discharge to home after PACU  PATIENT DISPOSITION:  PACU - hemodynamically stable.  OR FINDINGS: Posterior midline draining seton.  This does appear to have migrated into a much more superficial location involving less than 10% of the sphincter muscle.  The tissue planes are quite scarred from her prior horseshoe abscess.  This is not amenable to a LIFT procedure.  The anoderm at this location is quite scarred as one would expect and this did not appear to be amenable to a endorectal or anocutaneous advancement flap.  A cutting seton was placed.  She does have an apparent pinpoint opening on the left posterior buttock.  This was gently probed on multiple attempts but no fistula to the anal canal could be proven.  This also did not appear to communicate with the posterior midline fistula.  Dilute peroxide did not demonstrate any extravasation of bubbles.  This was "landscape" to facilitate drainage and a partial fistulotomy carried out.  Will wait and see how this wound ultimately heals.  DESCRIPTION: The patient was identified in the preoperative holding area and taken to the OR. SCDs were applied. She then underwent general endotracheal anesthesia without difficulty. The patient was then rolled  onto the OR table in the prone jackknife position. Pressure points were then evaluated and padded. Benzoin was applied to the buttocks and they were gently taped apart.  She was then prepped and draped in usual sterile fashion.  A surgical timeout was performed indicating the correct patient, procedure, and positioning.  A perianal block was then created using a dilute mixture of 0.25% Marcaine with epinephrine and Exparel.  After ascertaining an appropriate level of anesthesia had been achieved, a well lubricated digital rectal exam was performed. This demonstrated no palpable abnormalities.  A Hill-Ferguson anoscope was into the anal canal and circumferential inspection demonstrated the posterior midline draining seton.  This has migrated into a much shallower location and is well below the dentate.  This involves minimal sphincter muscle, less than 10% of the sphincter apparatus.  The anoderm was quite scarred at this location and this tissue does not look amenable to any sort of anocutaneous or endorectal advancement flap.  The shallow location of the fistula also precludes any sort of lift procedure.  The decision was made at this point to proceed with placement of the cutting seton.  There is a small foci of granulation tissue on the left posterior perianal skin.  This was gently cannulated multiple times but no communication with the anal canal could be proven.  This was injected with a dilute mixture of peroxide and there was no extravasation of bubbles to suggest an active fistula.  This was felt to most likely therefore represent a chronic wound.  The opening was widened to facilitate ongoing drainage  until everything heals and a partial "fistulotomy" was carried out of this tract.  There is no division of sphincter muscle.  Attention was turned to the draining seton.  A fistula probe was placed and the seton removed.  This was exchanged for a cutting seton which was constructed using a blue vessel  loop tied snugly but not too tightly around the very small remnant bundle of muscle.  The overlying anoderm was incised to facilitate a less uncomfortable migration of this cutting seton.  Hemostasis was verified.  Additional local anesthetic was infiltrated into the field.  Dibucaine ointment was applied.  A dressing consisting of 4 x 4's, ABD, and mesh underwear was placed.  She was rolled back onto the stretcher, awakened from anesthesia, extubated, and transported to PACU in satisfactory condition.  DISPOSITION: PACU in satisfactory condition.

## 2020-12-08 NOTE — Discharge Instructions (Addendum)
ANORECTAL SURGERY: POST OP INSTRUCTIONS  1. DIET: Follow a light bland diet the first 24 hours after arrival home, such as soup, liquids, crackers, etc.  Be sure to include lots of fluids daily.  Avoid fast food or heavy meals as your are more likely to get nauseated.  Eat a low fat diet the next few days after surgery.   2. Some bleeding with bowel movements is expected for the first couple of days but this should stop in between bowel movements  3. Take your usually prescribed home medications unless otherwise directed.  4. PAIN CONTROL: a. It is helpful to take an over-the-counter pain medication regularly for the first few days/weeks.  Choose from the following that works best for you: i. Ibuprofen (Advil, etc) Three 23m tabs every 6 hours as needed. ii. Acetaminophen (Tylenol, etc) 500-6570mevery 6 hours as needed iii. NOTE: You may take both of these medications together - most patients find it most helpful when alternating between the two (i.e. Ibuprofen at 6am, tylenol at 9am, ibuprofen at 12pm ...) b. A  prescription for pain medication may have been prescribed for you at discharge.  Take your pain medication as prescribed.  i. If you are having problems/concerns with the prescription medicine, please call us[place]oreaor further advice.  5. Avoid getting constipated.  Between the surgery and the pain medications, it is common to experience some constipation.  Increasing fluid intake (64oz of water per day) and taking a fiber supplement (such as Metamucil, Citrucel, FiberCon) 1-2 times a day regularly will usually help prevent this problem from occurring.  Take Miralax (over the counter) 1-2x/day while taking a narcotic pain medication. If no bowel movement after 48hours, you may additionally take a laxative like a bottle of Milk of Magnesia which can be purchased over the counter. Avoid enemas if possible as these are often painful.   6. Watch out for diarrhea.  If you have many loose bowel  movements, simplify your diet to bland foods.  Stop any stool softeners and decrease your fiber supplement. If this worsens or does not improve, please call us[place]orea 7. Wash / shower every day.  If you were discharged with a dressing, you may remove this the day after your surgery. You may shower normally, getting soap/water on your wound, particularly after bowel movements.  8. Soaking in a warm bath filled a couple inches ("Sitz bath") is a great way to clean the area after a bowel movement and many patients find it is a way to soothe the area.  9. ACTIVITIES as tolerated:   a. You may resume regular (light) daily activities beginning the next day--such as daily self-care, walking, climbing stairs--gradually increasing activities as tolerated.  If you can walk 30 minutes without difficulty, it is safe to try more intense activity such as jogging, treadmill, bicycling, low-impact aerobics, etc. b. Refrain from any heavy lifting or straining for the first 2 weeks after your procedure, particularly if your surgery was for hemorrhoids. c. Avoid activities that make your pain worse d. You may drive when you are no longer taking prescription pain medication, you can comfortably wear a seatbelt, and you can safely maneuver your car and apply brakes.  10. FOLLOW UP in our office a. Please call [facility] at [phone] to set up an appointment to see your surgeon in the office for a follow-up appointment approximately 2 weeks after your surgery. b. Make sure that you call for this appointment the day you arrive  home to insure a convenient appointment time.  9. If you have disability or family leave forms that need to be completed, you may have them completed by your primary care physician's office; for return to work instructions, please ask our office staff and they will be happy to assist you in obtaining this documentation   When to call us [phone]: 1. Poor pain control 2. Reactions / problems with  new medications (rash/itching, etc)  3. Fever over 101.5 F (38.5 C) 4. Inability to urinate 5. Nausea/vomiting 6. Worsening swelling or bruising 7. Continued bleeding from incision. 8. Increased pain, redness, or drainage from the incision  The clinic staff is available to answer your questions during regular business hours (8:30am-5pm).  Please don't hesitate to call and ask to speak to one of our nurses for clinical concerns.   A surgeon from [facility] is always on call at the hospitals   If you have a medical emergency, go to the nearest emergency room or call 911.   [facility], [place], [place], [place], [place]  [id number] ? MAIN: [phone] FAX [phone] [email or web address]   Post Anesthesia Home Care Instructions  Activity: Get plenty of rest for the remainder of the day. A responsible individual must stay with you for 24 hours following the procedure.  For the next 24 hours, DO NOT: -Drive a car -Paediatric nurse -Drink alcoholic beverages -Take any medication unless instructed by your physician -Make any legal decisions or sign important papers.  Meals: Start with liquid foods such as gelatin or soup. Progress to regular foods as tolerated. Avoid greasy, spicy, heavy foods. If nausea and/or vomiting occur, drink only clear liquids until the nausea and/or vomiting subsides. Call your physician if vomiting continues.  Special Instructions/Symptoms: Your throat may feel dry or sore from the anesthesia or the breathing tube placed in your throat during surgery. If this causes discomfort, gargle with warm salt water. The discomfort should disappear within 24 hours.  Information for Discharge Teaching: EXPAREL (bupivacaine liposome injectable suspension)   Your surgeon or anesthesiologist gave you EXPAREL(bupivacaine) to help control your pain after surgery.   EXPAREL is a local anesthetic that provides pain  relief by numbing the tissue around the surgical site.  EXPAREL is designed to release pain medication over time and can control pain for up to 72 hours.  Depending on how you respond to EXPAREL, you may require less pain medication during your recovery.  Possible side effects:  Temporary loss of sensation or ability to move in the area where bupivacaine was injected.  Nausea, vomiting, constipation  Rarely, numbness and tingling in your mouth or lips, lightheadedness, or anxiety may occur.  Call your doctor right away if you think you may be experiencing any of these sensations, or if you have other questions regarding possible side effects.  Follow all other discharge instructions given to you by your surgeon or nurse. Eat a healthy diet and drink plenty of water or other fluids.  If you return to the hospital for any reason within 96 hours following the administration of EXPAREL, it is important for health care providers to know that you have received this anesthetic. A teal colored band has been placed on your arm with the date, time and amount of EXPAREL you have received in order to alert and inform your health care providers. Please leave this armband in place for the full 96 hours following administration, and then you may  remove the band.  Do not remove green armband before Monday, December 11, 2020.

## 2020-12-08 NOTE — Anesthesia Procedure Notes (Signed)
Procedure Name: Intubation Date/Time: 12/08/2020 9:38 AM Performed by: Suan Halter, CRNA Pre-anesthesia Checklist: Patient identified, Emergency Drugs available, Suction available and Patient being monitored Patient Re-evaluated:Patient Re-evaluated prior to induction Oxygen Delivery Method: Circle system utilized Preoxygenation: Pre-oxygenation with 100% oxygen Induction Type: IV induction Ventilation: Mask ventilation without difficulty Laryngoscope Size: Mac and 4 Tube type: Oral Tube size: 7.0 mm Number of attempts: 1 Airway Equipment and Method: Stylet and Oral airway Placement Confirmation: ETT inserted through vocal cords under direct vision,  positive ETCO2 and breath sounds checked- equal and bilateral Secured at: 21 cm Tube secured with: Tape Dental Injury: Teeth and Oropharynx as per pre-operative assessment

## 2020-12-11 ENCOUNTER — Encounter (HOSPITAL_BASED_OUTPATIENT_CLINIC_OR_DEPARTMENT_OTHER): Payer: Self-pay | Admitting: Surgery

## 2021-02-22 ENCOUNTER — Encounter (HOSPITAL_COMMUNITY): Payer: Self-pay | Admitting: Emergency Medicine

## 2021-02-22 ENCOUNTER — Other Ambulatory Visit: Payer: Self-pay

## 2021-02-22 ENCOUNTER — Inpatient Hospital Stay (HOSPITAL_COMMUNITY)
Admission: EM | Admit: 2021-02-22 | Discharge: 2021-02-26 | DRG: 123 | Disposition: A | Discharge status: Home / Self Care | Payer: No Typology Code available for payment source | Attending: Internal Medicine | Admitting: Internal Medicine

## 2021-02-22 ENCOUNTER — Emergency Department (HOSPITAL_COMMUNITY): Payer: No Typology Code available for payment source

## 2021-02-22 DIAGNOSIS — G35 Multiple sclerosis: Secondary | ICD-10-CM | POA: Diagnosis not present

## 2021-02-22 DIAGNOSIS — M47812 Spondylosis without myelopathy or radiculopathy, cervical region: Secondary | ICD-10-CM | POA: Diagnosis not present

## 2021-02-22 DIAGNOSIS — Z9109 Other allergy status, other than to drugs and biological substances: Secondary | ICD-10-CM

## 2021-02-22 DIAGNOSIS — H5462 Unqualified visual loss, left eye, normal vision right eye: Secondary | ICD-10-CM | POA: Diagnosis not present

## 2021-02-22 DIAGNOSIS — E876 Hypokalemia: Secondary | ICD-10-CM | POA: Diagnosis not present

## 2021-02-22 DIAGNOSIS — G379 Demyelinating disease of central nervous system, unspecified: Secondary | ICD-10-CM

## 2021-02-22 DIAGNOSIS — H469 Unspecified optic neuritis: Principal | ICD-10-CM | POA: Diagnosis present

## 2021-02-22 DIAGNOSIS — J019 Acute sinusitis, unspecified: Secondary | ICD-10-CM | POA: Diagnosis not present

## 2021-02-22 DIAGNOSIS — H4710 Unspecified papilledema: Secondary | ICD-10-CM | POA: Diagnosis not present

## 2021-02-22 DIAGNOSIS — E669 Obesity, unspecified: Secondary | ICD-10-CM | POA: Diagnosis present

## 2021-02-22 DIAGNOSIS — L309 Dermatitis, unspecified: Secondary | ICD-10-CM | POA: Diagnosis present

## 2021-02-22 DIAGNOSIS — F17210 Nicotine dependence, cigarettes, uncomplicated: Secondary | ICD-10-CM | POA: Diagnosis present

## 2021-02-22 DIAGNOSIS — Z9884 Bariatric surgery status: Secondary | ICD-10-CM | POA: Diagnosis not present

## 2021-02-22 DIAGNOSIS — Z882 Allergy status to sulfonamides status: Secondary | ICD-10-CM | POA: Diagnosis not present

## 2021-02-22 DIAGNOSIS — R69 Illness, unspecified: Secondary | ICD-10-CM | POA: Diagnosis not present

## 2021-02-22 DIAGNOSIS — M4802 Spinal stenosis, cervical region: Secondary | ICD-10-CM | POA: Diagnosis not present

## 2021-02-22 DIAGNOSIS — K219 Gastro-esophageal reflux disease without esophagitis: Secondary | ICD-10-CM | POA: Diagnosis present

## 2021-02-22 DIAGNOSIS — U071 COVID-19: Secondary | ICD-10-CM | POA: Diagnosis not present

## 2021-02-22 DIAGNOSIS — G9389 Other specified disorders of brain: Secondary | ICD-10-CM | POA: Diagnosis not present

## 2021-02-22 DIAGNOSIS — M47816 Spondylosis without myelopathy or radiculopathy, lumbar region: Secondary | ICD-10-CM | POA: Diagnosis not present

## 2021-02-22 DIAGNOSIS — E282 Polycystic ovarian syndrome: Secondary | ICD-10-CM | POA: Diagnosis present

## 2021-02-22 DIAGNOSIS — G43909 Migraine, unspecified, not intractable, without status migrainosus: Secondary | ICD-10-CM | POA: Diagnosis present

## 2021-02-22 DIAGNOSIS — M4804 Spinal stenosis, thoracic region: Secondary | ICD-10-CM | POA: Diagnosis not present

## 2021-02-22 DIAGNOSIS — Z6836 Body mass index (BMI) 36.0-36.9, adult: Secondary | ICD-10-CM | POA: Diagnosis not present

## 2021-02-22 DIAGNOSIS — Z885 Allergy status to narcotic agent status: Secondary | ICD-10-CM

## 2021-02-22 DIAGNOSIS — M5124 Other intervertebral disc displacement, thoracic region: Secondary | ICD-10-CM | POA: Diagnosis not present

## 2021-02-22 DIAGNOSIS — Z886 Allergy status to analgesic agent status: Secondary | ICD-10-CM | POA: Diagnosis not present

## 2021-02-22 LAB — CBC WITH DIFFERENTIAL/PLATELET
Abs Immature Granulocytes: 0.01 10*3/uL (ref 0.00–0.07)
Basophils Absolute: 0 10*3/uL (ref 0.0–0.1)
Basophils Relative: 0 %
Eosinophils Absolute: 0.1 10*3/uL (ref 0.0–0.5)
Eosinophils Relative: 1 %
HCT: 35.8 % — ABNORMAL LOW (ref 36.0–46.0)
Hemoglobin: 11 g/dL — ABNORMAL LOW (ref 12.0–15.0)
Immature Granulocytes: 0 %
Lymphocytes Relative: 33 %
Lymphs Abs: 2.1 10*3/uL (ref 0.7–4.0)
MCH: 25.4 pg — ABNORMAL LOW (ref 26.0–34.0)
MCHC: 30.7 g/dL (ref 30.0–36.0)
MCV: 82.7 fL (ref 80.0–100.0)
Monocytes Absolute: 0.2 10*3/uL (ref 0.1–1.0)
Monocytes Relative: 4 %
Neutro Abs: 3.9 10*3/uL (ref 1.7–7.7)
Neutrophils Relative %: 62 %
Platelets: 272 10*3/uL (ref 150–400)
RBC: 4.33 MIL/uL (ref 3.87–5.11)
RDW: 17.6 % — ABNORMAL HIGH (ref 11.5–15.5)
WBC: 6.3 10*3/uL (ref 4.0–10.5)
nRBC: 0 % (ref 0.0–0.2)

## 2021-02-22 LAB — I-STAT CHEM 8, ED
BUN: 8 mg/dL (ref 6–20)
Calcium, Ion: 1.24 mmol/L (ref 1.15–1.40)
Chloride: 106 mmol/L (ref 98–111)
Creatinine, Ser: 0.7 mg/dL (ref 0.44–1.00)
Glucose, Bld: 86 mg/dL (ref 70–99)
HCT: 36 % (ref 36.0–46.0)
Hemoglobin: 12.2 g/dL (ref 12.0–15.0)
Potassium: 3.3 mmol/L — ABNORMAL LOW (ref 3.5–5.1)
Sodium: 145 mmol/L (ref 135–145)
TCO2: 27 mmol/L (ref 22–32)

## 2021-02-22 LAB — URINALYSIS, ROUTINE W REFLEX MICROSCOPIC
Bacteria, UA: NONE SEEN
Bilirubin Urine: NEGATIVE
Glucose, UA: NEGATIVE mg/dL
Ketones, ur: NEGATIVE mg/dL
Leukocytes,Ua: NEGATIVE
Nitrite: NEGATIVE
Protein, ur: NEGATIVE mg/dL
Specific Gravity, Urine: 1.016 (ref 1.005–1.030)
pH: 5 (ref 5.0–8.0)

## 2021-02-22 LAB — I-STAT BETA HCG BLOOD, ED (MC, WL, AP ONLY): I-stat hCG, quantitative: <5 m[IU]/mL (ref <5)

## 2021-02-22 LAB — RAPID URINE DRUG SCREEN, HOSP PERFORMED
Amphetamines: NOT DETECTED
Barbiturates: NOT DETECTED
Benzodiazepines: NOT DETECTED
Cocaine: NOT DETECTED
Opiates: NOT DETECTED
Tetrahydrocannabinol: NOT DETECTED

## 2021-02-22 LAB — MAGNESIUM: Magnesium: 2 mg/dL (ref 1.7–2.4)

## 2021-02-22 MED ORDER — POTASSIUM CHLORIDE CRYS ER 20 MEQ PO TBCR
40.0000 meq | EXTENDED_RELEASE_TABLET | Freq: Once | ORAL | Status: AC
  Administered 2021-02-22: 40 meq via ORAL
  Filled 2021-02-22: qty 2

## 2021-02-22 MED ORDER — GADOBUTROL 1 MMOL/ML IV SOLN
10.0000 mL | Freq: Once | INTRAVENOUS | Status: AC | PRN
  Administered 2021-02-22: 10 mL via INTRAVENOUS

## 2021-02-22 MED ORDER — SODIUM CHLORIDE 0.9 % IV SOLN
1000.0000 mg | Freq: Every day | INTRAVENOUS | Status: AC
  Administered 2021-02-22 – 2021-02-26 (×5): 1000 mg via INTRAVENOUS
  Filled 2021-02-22 (×5): qty 8

## 2021-02-22 MED ORDER — NICOTINE 7 MG/24HR TD PT24
7.0000 mg | MEDICATED_PATCH | Freq: Every day | TRANSDERMAL | Status: DC
  Administered 2021-02-23 – 2021-02-26 (×4): 7 mg via TRANSDERMAL
  Filled 2021-02-22 (×4): qty 1

## 2021-02-22 MED ORDER — SODIUM CHLORIDE 0.9 % IV SOLN: 250.0000 mL | INTRAVENOUS | Status: DC | PRN

## 2021-02-22 MED ORDER — SODIUM CHLORIDE 0.9% FLUSH
3.0000 mL | Freq: Two times a day (BID) | INTRAVENOUS | Status: DC
  Administered 2021-02-22 – 2021-02-26 (×8): 3 mL via INTRAVENOUS

## 2021-02-22 MED ORDER — SODIUM CHLORIDE 0.9% FLUSH: 3.0000 mL | INTRAVENOUS | Status: DC | PRN

## 2021-02-22 MED ORDER — ACETAMINOPHEN 650 MG RE SUPP: 650.0000 mg | Freq: Four times a day (QID) | RECTAL | Status: DC | PRN

## 2021-02-22 MED ORDER — ACETAMINOPHEN 325 MG PO TABS
650.0000 mg | ORAL_TABLET | Freq: Four times a day (QID) | ORAL | Status: DC | PRN
  Administered 2021-02-22 – 2021-02-26 (×7): 650 mg via ORAL
  Filled 2021-02-22 (×8): qty 2

## 2021-02-22 MED ORDER — ADULT MULTIVITAMIN W/MINERALS CH
1.0000 | ORAL_TABLET | Freq: Every day | ORAL | Status: DC
  Administered 2021-02-23 – 2021-02-26 (×4): 1 via ORAL
  Filled 2021-02-22 (×4): qty 1

## 2021-02-22 NOTE — ED Triage Notes (Signed)
There patient presents due to left eye vision loss. Her optometrist told the patient to come to the ED to rule out stroke or MS.

## 2021-02-22 NOTE — H&P (Signed)
History and Physical    LUGENIA ASSEFA ZOX:[id number] DOB: 1986-05-24 DOA: 02/22/2021  PCP: Patient, No Pcp Per   Patient coming from: Home  Chief Complaint: Vision loss left eye  HPI: BONNIE OVERDORF is a 35 y.o. female with medical history significant for  paroxysmal atrial tachycardia, iron deficiency anemia, presenting with complaints of vision loss.  Patient report approximately a week ago she developed some blurry vision in her left eye. She reports developing pain behind her left eye a few days ago. She reports having a dull headache on left and her vision progressed from being blurry in left eye to complete vision loss in left eye today prompting her to go to optomitrist.  States that she sees nothing but black in her left eye.  Headache since resolved however visual change persist.  No associated double vision, no focal numbness or focal weakness. She initially thought it may be due to sinus pressure and allergies.  She denies any prior visual impairment.  She does not wear contact lenses or prescription glasses.  She went to see an eye specialist today at [facility] and was evaluated by ophthalmologist, Dr. Tama High.  States that she had an eye exam and was recommended to come to the ER for further work-up which include brain orbit MRI for further assessment.  Patient otherwise denies any recent injury and denies any history of prior stroke.  She is a smoker and smoked half a pack of cigarettes a day.  ED Course: MRI revealed the old myelinating lesions in the brain and optic neuritis consistent with multiple sclerosis.  Remainder of work-up was otherwise unremarkable.  Patient was started on high-dose Solu-Medrol the emergency room and neurology recommended admission to medicine service  Review of Systems:  General: Denies fever, chills, weight loss, night sweats.  Denies dizziness.  Denies change in appetite HENT: Denies head trauma, headache, denies change in  hearing, tinnitus.  Denies nasal  bleeding.  Denies sore throat, sores in mouth.  Denies difficulty swallowing Eyes: Reports blurry vision which progressed to vision loss in left eye Denies drainage.  Denies discoloration of eyes. Neck: Denies pain.  Denies swelling.  Denies pain with movement. Cardiovascular: Denies chest pain, palpitations.  Denies edema.  Denies orthopnea Respiratory: Denies shortness of breath, cough.  Denies wheezing.  Denies sputum production Gastrointestinal: Denies abdominal pain, swelling.  Denies nausea, vomiting, diarrhea.  Denies melena.  Denies hematemesis. Musculoskeletal: Denies limitation of movement.  Denies deformity or swelling.  Denies pain.  Denies arthralgias or myalgias. Genitourinary: Denies pelvic pain.  Denies urinary frequency or hesitancy.  Denies dysuria.  Skin: Denies rash.  Denies petechiae, purpura, ecchymosis. Neurological: Denies headache.  Denies syncope.  Denies seizure activity. Denies weakness or paresthesia.  Denies slurred speech, drooping face.  Denies visual change. Psychiatric: Denies depression, anxiety.  Denies suicidal thoughts or ideation.  Denies hallucinations.  Past Medical History:  Diagnosis Date  . Arthritis of low back   . Complication of anesthesia    is of [name] descent  . Family history of adverse reaction to anesthesia    pt is a [name]  . GERD (gastroesophageal reflux disease)    none since pregnancy   . Hemorrhoids   . History of PAT (paroxysmal atrial tachycardia)    had cardiac ablation as a teenager  . History of sleep apnea    none since 450 pound weight loss in 2015  . Migraines   . PCOD (polycystic ovarian disease)  no menses    Past Surgical History:  Procedure Laterality Date  . BREAST LUMPECTOMY Left 2011   papilloma  . BREAST LUMPECTOMY Left 2015   papilloma  . BREAST LUMPECTOMY WITH RADIOACTIVE SEED LOCALIZATION Left 10/18/2014   Procedure:  RADIOACTIVE SEED GUIDED EXCISIONAL BREAST  BIOPSY;  Surgeon: Alphonsa Overall, MD;  Location: [facility];  Service: General;  Laterality: Left;  . BREAST SURGERY Right 07/27/2004   retro-areolar dissection with exc. of large duct  . CARDIAC ELECTROPHYSIOLOGY MAPPING AND ABLATION  sge 13  . CARDIAC ELECTROPHYSIOLOGY STUDY AND ABLATION  age 43  . CESAREAN SECTION N/A 03/28/2020   Procedure: CESAREAN SECTION;  Surgeon: Deliah Boston, MD;  Location: [facility];  Service: Obstetrics;  Laterality: N/A;  RNFA [place]  . CESAREAN SECTION  03/2018  . CHOLECYSTECTOMY  04/27/2008  . GASTROPLASTY DUODENAL SWITCH  01/2014   done at [place]  . INCISION AND DRAINAGE PERIRECTAL ABSCESS N/A 08/15/2020   Procedure: ANORECTAL EXAM UNDER ANESTHESIA INCISION AND DRAINAGE OF PERIRECTAL ABSCESS x2 WITH PLACEMENT OF SETON;  Surgeon: Ileana Roup, MD;  Location: [facility];  Service: General;  Laterality: N/A;  . OVARIAN CYST REMOVAL Bilateral 1999  . PLACEMENT OF SETON  08/15/2020  . PLACEMENT OF SETON N/A 12/08/2020   Procedure: PLACEMENT OF CUTTING SETON; PARTIAL FISTULOTOMY;  Surgeon: Ileana Roup, MD;  Location: [facility];  Service: General;  Laterality: N/A;  . RECTAL EXAM UNDER ANESTHESIA N/A 12/08/2020   Procedure: ANORECTAL EXAM UNDER ANESTHESIA;  Surgeon: Ileana Roup, MD;  Location: [facility];  Service: General;  Laterality: N/A;  . TOENAIL EXCISION  yrs ago  . UNILATERAL SALPINGECTOMY      Social History  reports that she has been smoking cigarettes. She has a 5.00 pack-year smoking history. She has never used smokeless tobacco. She reports that she does not drink alcohol and does not use drugs.  Allergies  Allergen Reactions  . Nsaids Other (See Comments)    HX. OF WEIGHT-LOSS SURGERY   . Other Rash    Reaction to LOTIONS   . Sulfa Antibiotics Rash  . Tramadol Rash    Family History  Problem Relation Age of Onset  . Cancer Paternal Grandfather   . Cancer Maternal Grandmother    . Heart disease Maternal Grandmother   . Diabetes Maternal Grandfather   . Hypertension Father   . Hyperlipidemia Father   . Hypertension Mother   . Diabetes Mother      Prior to Admission medications   Medication Sig Start Date End Date Taking? Authorizing Provider  acetaminophen (TYLENOL) 325 MG tablet Take 650 mg by mouth every 6 (six) hours as needed.    [provider]    Physical Exam: Vitals:   02/22/21 1715 02/22/21 1911 02/22/21 1915 02/22/21 1930  BP: 134/81 (!) 141/85 (!) 142/94 (!) 130/99  Pulse:  61 67 72  Resp:  18    Temp:      TempSrc:      SpO2:  99% 100% 100%  Weight:      Height:        Constitutional: NAD, calm, comfortable Vitals:   02/22/21 1715 02/22/21 1911 02/22/21 1915 02/22/21 1930  BP: 134/81 (!) 141/85 (!) 142/94 (!) 130/99  Pulse:  61 67 72  Resp:  18    Temp:      TempSrc:      SpO2:  99% 100% 100%  Weight:  Height:       General: WDWN, Alert and oriented x3.  Eyes: EOMI, PERRL, conjunctivae normal.  Sclera nonicteric HENT:  [place]/AT, external ears normal.  Nares patent without epistasis.  Mucous membranes are moist. Posterior pharynx clear of any exudate or lesions.  Neck: Soft, normal range of motion, supple, no masses, no thyromegaly.  Trachea midline Respiratory: clear to auscultation bilaterally, no wheezing, no crackles. Normal respiratory effort. No accessory muscle use.  Cardiovascular: Regular rate and rhythm, no murmurs / rubs / gallops. No extremity edema.  Abdomen: Soft, no tenderness, nondistended, no rebound or guarding.  No masses palpated. Bowel sounds normoactive Musculoskeletal: FROM. no cyanosis. No joint deformity upper and lower extremities. Normal muscle tone.  Skin: Warm, dry. Has dry cracking skin of dorsal hands and fingers. No erythema or drainage. no lesions, ulcers. No induration Neurologic: CN 2-12 grossly intact.  Normal speech.  Sensation intact, Strength 5/5 in all extremities.   Psychiatric:  Normal judgment and insight.  Normal mood.    Labs on Admission: I have personally reviewed following labs and imaging studies  CBC: Recent Labs  Lab 02/22/21 1615 02/22/21 1622  WBC 6.3  --   NEUTROABS 3.9  --   HGB 11.0* 12.2  HCT 35.8* 36.0  MCV 82.7  --   PLT 272  --     Basic Metabolic Panel: Recent Labs  Lab 02/22/21 1622  NA 145  K 3.3*  CL 106  GLUCOSE 86  BUN 8  CREATININE 0.70    GFR: Estimated Creatinine Clearance: 137.5 mL/min (by C-G formula based on SCr of 0.7 mg/dL).  Liver Function Tests: No results for input(s): AST, ALT, ALKPHOS, BILITOT, PROT, ALBUMIN in the last 168 hours.  Urine analysis:    Component Value Date/Time   COLORURINE YELLOW 08/14/2020 1753   APPEARANCEUR CLEAR 08/14/2020 1753   APPEARANCEUR Clear 09/11/2017 1112   LABSPEC 1.010 08/14/2020 1753   PHURINE 6.0 08/14/2020 1753   GLUCOSEU NEGATIVE 08/14/2020 1753   HGBUR NEGATIVE 08/14/2020 1753   BILIRUBINUR NEGATIVE 08/14/2020 1753   BILIRUBINUR Negative 09/11/2017 1112   KETONESUR NEGATIVE 08/14/2020 1753   PROTEINUR NEGATIVE 08/14/2020 1753   UROBILINOGEN 0.2 12/05/2014 1914   NITRITE NEGATIVE 08/14/2020 1753   LEUKOCYTESUR NEGATIVE 08/14/2020 1753    Radiological Exams on Admission: MR Brain W and Wo Contrast  Result Date: 02/22/2021 CLINICAL DATA:  Vision loss, monocular. EXAM: MRI HEAD AND ORBITS WITHOUT AND WITH CONTRAST TECHNIQUE: Multiplanar, multiecho pulse sequences of the brain and surrounding structures were obtained without and with intravenous contrast. Multiplanar, multiecho pulse sequences of the orbits and surrounding structures were obtained including fat saturation techniques, before and after intravenous contrast administration. CONTRAST:  55mL GADAVIST GADOBUTROL 1 MMOL/ML IV SOLN COMPARISON:  No pertinent prior exams available for comparison. FINDINGS: MRI HEAD FINDINGS Brain: Mild-to-moderate intermittent motion degradation, somewhat limiting evaluation.  Cerebral volume is normal for age. There is multifocal T2/FLAIR hyperintensity within the subcortical/juxtacortical and deep periventricular white matter. Many of the foci of signal abnormality are oriented perpendicular to the long axis of the lateral ventricles and findings are highly suggestive of a demyelinating process such as multiple sclerosis. A few foci of signal abnormality are also noted within the corpus callosum. Several lesions within the left centrum semiovale/corona radiata measuring up to 11 mm demonstrate corresponding restricted diffusion and subtle enhancement, and findings are compatible with active demyelination. Additionally, subtle corresponding restricted diffusion is questioned at sites of small lesions within the right centrum semiovale/corona radiata  measuring up to 7 mm and active demyelination may also be present at these sites. No posterior fossa lesion is identified. No evidence of intracranial mass. No chronic intracranial blood products. No extra-axial fluid collection. No midline shift. Vascular: Expected proximal arterial flow voids. Skull and upper cervical spine: No focal marrow lesion. MRI ORBITS FINDINGS The examination is intermittently motion degraded, limiting evaluation. Most notably, there is moderate/severe motion degradation of the coronal T2 weighted sequence and moderate motion degradation of the T1 weighted postcontrast sequences. Orbits: The globes are normal in size and contour. There is T2 hyperintense signal abnormality within portions of the intraorbital left optic nerve with associated abnormal enhancement (for instance as seen on series 20, image 17) (series 25, images 16-18). Findings are compatible with acute optic neuritis. Within the limitations of motion degradation, there is no appreciable signal abnormality or abnormal enhancement within the right optic nerve. The extraocular muscles and optic nerve sheath complexes are symmetric and unremarkable.  Visualized sinuses: Paranasal sinus mucosal thickening at the imaged levels. Most notably, there is moderate left ethmoid sinus mucosal thickening. Small right maxillary sinus mucous retention cyst. Soft tissues: No appreciable abnormality of the visualized maxillofacial or upper neck soft tissues. IMPRESSION: MRI brain: 1. Motion degraded exam as described. 2. Multifocal T2/FLAIR hyperintensity within the bilateral subcortical/juxtacortical and deep periventricular white matter, and also within the corpus callosum. These foci have an appearance highly suggestive of a demyelinating process such as multiple sclerosis. There is restricted diffusion and enhancement associated with several lesions within the left centrum semiovale/corona radiata measuring up to 11 mm, and findings are compatible with active demyelination. Active demyelination is also questioned at sites of smaller lesions within the right centrum semiovale/corona radiata measuring up to 7 mm. MRI orbits: 1. Motion degraded examination, as described and limiting evaluation. 2. Findings compatible with acute left optic neuritis, as described. 3. Within the limitations of motion degradation, no definite signal abnormality or abnormal enhancement of the right optic nerve. 4. Paranasal sinus disease as described. Electronically Signed   By: Kellie Simmering DO   On: 02/22/2021 19:28   MR ORBITS W WO CONTRAST  Result Date: 02/22/2021 CLINICAL DATA:  Vision loss, monocular. EXAM: MRI HEAD AND ORBITS WITHOUT AND WITH CONTRAST TECHNIQUE: Multiplanar, multiecho pulse sequences of the brain and surrounding structures were obtained without and with intravenous contrast. Multiplanar, multiecho pulse sequences of the orbits and surrounding structures were obtained including fat saturation techniques, before and after intravenous contrast administration. CONTRAST:  44mL GADAVIST GADOBUTROL 1 MMOL/ML IV SOLN COMPARISON:  No pertinent prior exams available for  comparison. FINDINGS: MRI HEAD FINDINGS Brain: Mild-to-moderate intermittent motion degradation, somewhat limiting evaluation. Cerebral volume is normal for age. There is multifocal T2/FLAIR hyperintensity within the subcortical/juxtacortical and deep periventricular white matter. Many of the foci of signal abnormality are oriented perpendicular to the long axis of the lateral ventricles and findings are highly suggestive of a demyelinating process such as multiple sclerosis. A few foci of signal abnormality are also noted within the corpus callosum. Several lesions within the left centrum semiovale/corona radiata measuring up to 11 mm demonstrate corresponding restricted diffusion and subtle enhancement, and findings are compatible with active demyelination. Additionally, subtle corresponding restricted diffusion is questioned at sites of small lesions within the right centrum semiovale/corona radiata measuring up to 7 mm and active demyelination may also be present at these sites. No posterior fossa lesion is identified. No evidence of intracranial mass. No chronic intracranial blood products. No extra-axial fluid collection.  No midline shift. Vascular: Expected proximal arterial flow voids. Skull and upper cervical spine: No focal marrow lesion. MRI ORBITS FINDINGS The examination is intermittently motion degraded, limiting evaluation. Most notably, there is moderate/severe motion degradation of the coronal T2 weighted sequence and moderate motion degradation of the T1 weighted postcontrast sequences. Orbits: The globes are normal in size and contour. There is T2 hyperintense signal abnormality within portions of the intraorbital left optic nerve with associated abnormal enhancement (for instance as seen on series 20, image 17) (series 25, images 16-18). Findings are compatible with acute optic neuritis. Within the limitations of motion degradation, there is no appreciable signal abnormality or abnormal  enhancement within the right optic nerve. The extraocular muscles and optic nerve sheath complexes are symmetric and unremarkable. Visualized sinuses: Paranasal sinus mucosal thickening at the imaged levels. Most notably, there is moderate left ethmoid sinus mucosal thickening. Small right maxillary sinus mucous retention cyst. Soft tissues: No appreciable abnormality of the visualized maxillofacial or upper neck soft tissues. IMPRESSION: MRI brain: 1. Motion degraded exam as described. 2. Multifocal T2/FLAIR hyperintensity within the bilateral subcortical/juxtacortical and deep periventricular white matter, and also within the corpus callosum. These foci have an appearance highly suggestive of a demyelinating process such as multiple sclerosis. There is restricted diffusion and enhancement associated with several lesions within the left centrum semiovale/corona radiata measuring up to 11 mm, and findings are compatible with active demyelination. Active demyelination is also questioned at sites of smaller lesions within the right centrum semiovale/corona radiata measuring up to 7 mm. MRI orbits: 1. Motion degraded examination, as described and limiting evaluation. 2. Findings compatible with acute left optic neuritis, as described. 3. Within the limitations of motion degradation, no definite signal abnormality or abnormal enhancement of the right optic nerve. 4. Paranasal sinus disease as described. Electronically Signed   By: Kellie Simmering DO   On: 02/22/2021 19:28   Assessment/Plan Principal Problem:   Optic neuritis Ms. Leeman is admitted to Med/Surg floor. Started on high dose steroid with Solu-Medrol 1 g IV daily for 5 days. Neurology has been consulted and will see patient tonight and provide further recommendations.  Active Problems:   Demyelinating changes in brain  Patient with demyelinating lesions on MRI of brain concerning for multiple sclerosis.  Will obtain MRI of cervical and thoracic spine  to check for further demyelinating lesions in other regions of the central nervous system Awaiting neurology evaluation and recommendations    Hypokalemia Patient with mild hypokalemia with potassium 3.3.  Check magnesium level.  Replete potassium orally.     Dermatitis  Pt has dry cracking skin which could be due to vitamin deficiency as pt had gastric bypass surgery 8 yrs ago and is not taking MVI at home. Start MVI. Could also be contact dermatitis from frequent hand sanitizer use and frequent washing of hands. Use daily moisturizer    DVT prophylaxis: Padua score low. Ambulation and TED hose for DVT prophylaxis.  Code Status:   Full code Family Communication:  Diagnosis and plan discussed with patient and her husband who is at bedside.  Questions answered.  They agree with plan.  Further recommendations to follow as clinically indicated Disposition Plan:   Patient is from:  Home  Anticipated DC to:  Home  Anticipated DC date:  Anticipate more than 2 midnight stay in the hospital to treat acute condition  Anticipated DC barriers: No barriers to discharge identified at this time  Consults called:  Neurology, Dr. Rory Percy, called by  ER provider and will see Ms. Bear tonight  Admission status:  Inpatient   Yevonne Aline Chotiner MD [facility]  How to contact the [facility] Attending or Consulting provider [place] or covering provider during after hours [place], for this patient?   1. Check the care team in [facility] and look for a) attending/consulting [facility] provider listed and b) the [facility] team listed 2. Log into [email or web address] and use [facility]'s universal password to access. If you do not have the password, please contact the hospital operator. 3. Locate the [facility] provider you are looking for under [facility] and page to a number that you can be directly reached. 4. If you still have difficulty reaching the provider, please page the [facility] (Director on Call) for the Hospitalists listed on  amion for assistance.  02/22/2021, 8:54 PM

## 2021-02-22 NOTE — ED Provider Notes (Signed)
[facility] DEPT Provider Note   CSN: [id number] Arrival date & time: 02/22/21  1535     History Chief Complaint  Patient presents with  . Loss of Vision    Lynn Roberts is a 35 y.o. female.  The history is provided by the patient and medical records. No language interpreter was used.     35 year old female significant history of paroxysmal atrial tachycardia, iron deficiency anemia, presenting with complaints of vision loss.  Patient report approximately a week ago she developed some decreased visual changes to her left eye.  She described more as a pressure behind the left eye with some discomfort.  She also endorsed a headache behind the left eye as well which became progressively worse and stated her vision became begins to blurred and then eventually she has total visual loss in her left eye.  States that she sees nothing but black in her left eye.  Headache since resolved however visual change persist.  No associated double vision, having focal numbness or focal weakness.  She initially thought it may be due to sinus pressure and allergies.  She denies any prior visual impairment.  She does not wear contact lenses or prescription glasses.  She went to see an eye specialist today at [facility] and was evaluated by ophthalmologist, Dr. Tama High.  States that she had an eye exam and was recommended to come to the ER for further work-up which include brain orbit MRI for further assessment.  Patient otherwise denies any recent injury and denies any history of prior stroke.  She is a smoker and smoked half a pack of cigarettes a day.  Past Medical History:  Diagnosis Date  . Arthritis of low back   . Complication of anesthesia    is of [name] descent  . Family history of adverse reaction to anesthesia    pt is a [name]  . GERD (gastroesophageal reflux disease)    none since pregnancy   . Hemorrhoids   . History of PAT  (paroxysmal atrial tachycardia)    had cardiac ablation as a teenager  . History of sleep apnea    none since 450 pound weight loss in 2015  . Migraines   . PCOD (polycystic ovarian disease)    no menses    Patient Active Problem List   Diagnosis Date Noted  . Perirectal abscess 08/14/2020  . Placenta previa before labor and cesarean delivery without hemorrhage 03/28/2020  . Iron deficiency anemia due to chronic blood loss 03/03/2020  . Papilloma of breast, left 06/08/2014  . Back pain, chronic 08/10/2013  . Diverticulosis 08/10/2013  . Osteoarthritis of knee 08/10/2013  . Sleep apnea 08/10/2013    Past Surgical History:  Procedure Laterality Date  . BREAST LUMPECTOMY Left 2011   papilloma  . BREAST LUMPECTOMY Left 2015   papilloma  . BREAST LUMPECTOMY WITH RADIOACTIVE SEED LOCALIZATION Left 10/18/2014   Procedure:  RADIOACTIVE SEED GUIDED EXCISIONAL BREAST BIOPSY;  Surgeon: Alphonsa Overall, MD;  Location: [facility];  Service: General;  Laterality: Left;  . BREAST SURGERY Right 07/27/2004   retro-areolar dissection with exc. of large duct  . CARDIAC ELECTROPHYSIOLOGY MAPPING AND ABLATION  sge 13  . CARDIAC ELECTROPHYSIOLOGY STUDY AND ABLATION  age 69  . CESAREAN SECTION N/A 03/28/2020   Procedure: CESAREAN SECTION;  Surgeon: Deliah Boston, MD;  Location: [facility];  Service: Obstetrics;  Laterality: N/A;  RNFA [place]  . CESAREAN SECTION  03/2018  . CHOLECYSTECTOMY  04/27/2008  . GASTROPLASTY DUODENAL SWITCH  01/2014   done at [facility]  . INCISION AND DRAINAGE PERIRECTAL ABSCESS N/A 08/15/2020   Procedure: ANORECTAL EXAM UNDER ANESTHESIA INCISION AND DRAINAGE OF PERIRECTAL ABSCESS x2 WITH PLACEMENT OF SETON;  Surgeon: Ileana Roup, MD;  Location: [facility];  Service: General;  Laterality: N/A;  . OVARIAN CYST REMOVAL Bilateral 1999  . PLACEMENT OF SETON  08/15/2020  . PLACEMENT OF SETON N/A 12/08/2020   Procedure: PLACEMENT OF CUTTING SETON; PARTIAL FISTULOTOMY;   Surgeon: Ileana Roup, MD;  Location: [facility];  Service: General;  Laterality: N/A;  . RECTAL EXAM UNDER ANESTHESIA N/A 12/08/2020   Procedure: ANORECTAL EXAM UNDER ANESTHESIA;  Surgeon: Ileana Roup, MD;  Location: [facility];  Service: General;  Laterality: N/A;  . TOENAIL EXCISION  yrs ago  . UNILATERAL SALPINGECTOMY       OB History    Gravida  3   Para  1   Term      Preterm  1   AB  1   Living        SAB  1   IAB      Ectopic      Multiple  0   Live Births              Family History  Problem Relation Age of Onset  . Cancer Paternal Grandfather   . Cancer Maternal Grandmother   . Heart disease Maternal Grandmother   . Diabetes Maternal Grandfather   . Hypertension Father   . Hyperlipidemia Father   . Hypertension Mother   . Diabetes Mother     Social History   Tobacco Use  . Smoking status: Current Every Day Smoker    Packs/day: 0.50    Years: 10.00    Pack years: 5.00    Types: Cigarettes  . Smokeless tobacco: Never Used  Vaping Use  . Vaping Use: Never used  Substance Use Topics  . Alcohol use: No  . Drug use: No    Home Medications Prior to Admission medications   Medication Sig Start Date End Date Taking? Authorizing Provider  acetaminophen (TYLENOL) 325 MG tablet Take 650 mg by mouth every 6 (six) hours as needed.    [provider]    Allergies    Nsaids, Other, Sulfa antibiotics, and Tramadol  Review of Systems   Review of Systems  Constitutional: Negative for fever.  Eyes: Positive for pain and visual disturbance.  Skin: Negative for wound.  Neurological: Positive for headaches. Negative for speech difficulty and numbness.  All other systems reviewed and are negative.   Physical Exam Updated Vital Signs BP (!) 141/85   Pulse 61   Temp 97.9 F (36.6 C) (Oral)   Resp 18   Ht 5\' 11"  (1.803 m)   Wt 117.9 kg   SpO2 99%   BMI 36.26 kg/m   Physical  Exam Vitals and nursing note reviewed.  Constitutional:      General: She is not in acute distress.    Appearance: She is well-developed.  HENT:     Head: Atraumatic.  Eyes:     General: Lids are normal. Lids are everted, no foreign bodies appreciated.     Extraocular Movements: Extraocular movements intact.     Conjunctiva/sclera: Conjunctivae normal.     Pupils: Pupils are equal, round, and reactive to light.     Comments: Total visual loss to left  eye, pupil reactive bilaterally.  Neck:     Vascular: No carotid bruit.  Cardiovascular:     Rate and Rhythm: Normal rate and regular rhythm.     Pulses: Normal pulses.     Heart sounds: Normal heart sounds.  Pulmonary:     Effort: Pulmonary effort is normal.     Breath sounds: Normal breath sounds.  Abdominal:     Palpations: Abdomen is soft.  Musculoskeletal:        General: Normal range of motion.     Cervical back: Normal range of motion and neck supple.     Comments: 5 out of 5 strength all 4 extremities  Skin:    Findings: No rash.  Neurological:     Mental Status: She is alert and oriented to person, place, and time.     GCS: GCS eye subscore is 4. GCS verbal subscore is 5. GCS motor subscore is 6.     Cranial Nerves: Cranial nerves are intact.     Sensory: Sensation is intact.     Motor: Motor function is intact.  Psychiatric:        Mood and Affect: Mood normal.     ED Results / Procedures / Treatments   Labs (all labs ordered are listed, but only abnormal results are displayed) Labs Reviewed  CBC WITH DIFFERENTIAL/PLATELET - Abnormal; Notable for the following components:      Result Value   Hemoglobin 11.0 (*)    HCT 35.8 (*)    MCH 25.4 (*)    RDW 17.6 (*)    All other components within normal limits  I-STAT CHEM 8, ED - Abnormal; Notable for the following components:   Potassium 3.3 (*)    All other components within normal limits  SARS CORONAVIRUS 2 (TAT 6-24 HRS)  RAPID URINE DRUG SCREEN, HOSP  PERFORMED  URINALYSIS, ROUTINE W REFLEX MICROSCOPIC  I-STAT BETA HCG BLOOD, ED ([facility], [facility], [facility] ONLY)    EKG None  Radiology MR Brain W and Wo Contrast  Result Date: 02/22/2021 CLINICAL DATA:  Vision loss, monocular. EXAM: MRI HEAD AND ORBITS WITHOUT AND WITH CONTRAST TECHNIQUE: Multiplanar, multiecho pulse sequences of the brain and surrounding structures were obtained without and with intravenous contrast. Multiplanar, multiecho pulse sequences of the orbits and surrounding structures were obtained including fat saturation techniques, before and after intravenous contrast administration. CONTRAST:  57mL GADAVIST GADOBUTROL 1 MMOL/ML IV SOLN COMPARISON:  No pertinent prior exams available for comparison. FINDINGS: MRI HEAD FINDINGS Brain: Mild-to-moderate intermittent motion degradation, somewhat limiting evaluation. Cerebral volume is normal for age. There is multifocal T2/FLAIR hyperintensity within the subcortical/juxtacortical and deep periventricular white matter. Many of the foci of signal abnormality are oriented perpendicular to the long axis of the lateral ventricles and findings are highly suggestive of a demyelinating process such as multiple sclerosis. A few foci of signal abnormality are also noted within the corpus callosum. Several lesions within the left centrum semiovale/corona radiata measuring up to 11 mm demonstrate corresponding restricted diffusion and subtle enhancement, and findings are compatible with active demyelination. Additionally, subtle corresponding restricted diffusion is questioned at sites of small lesions within the right centrum semiovale/corona radiata measuring up to 7 mm and active demyelination may also be present at these sites. No posterior fossa lesion is identified. No evidence of intracranial mass. No chronic intracranial blood products. No extra-axial fluid collection. No midline shift. Vascular: Expected proximal arterial flow voids. Skull and upper cervical  spine: No focal marrow lesion. MRI ORBITS  FINDINGS The examination is intermittently motion degraded, limiting evaluation. Most notably, there is moderate/severe motion degradation of the coronal T2 weighted sequence and moderate motion degradation of the T1 weighted postcontrast sequences. Orbits: The globes are normal in size and contour. There is T2 hyperintense signal abnormality within portions of the intraorbital left optic nerve with associated abnormal enhancement (for instance as seen on series 20, image 17) (series 25, images 16-18). Findings are compatible with acute optic neuritis. Within the limitations of motion degradation, there is no appreciable signal abnormality or abnormal enhancement within the right optic nerve. The extraocular muscles and optic nerve sheath complexes are symmetric and unremarkable. Visualized sinuses: Paranasal sinus mucosal thickening at the imaged levels. Most notably, there is moderate left ethmoid sinus mucosal thickening. Small right maxillary sinus mucous retention cyst. Soft tissues: No appreciable abnormality of the visualized maxillofacial or upper neck soft tissues. IMPRESSION: MRI brain: 1. Motion degraded exam as described. 2. Multifocal T2/FLAIR hyperintensity within the bilateral subcortical/juxtacortical and deep periventricular white matter, and also within the corpus callosum. These foci have an appearance highly suggestive of a demyelinating process such as multiple sclerosis. There is restricted diffusion and enhancement associated with several lesions within the left centrum semiovale/corona radiata measuring up to 11 mm, and findings are compatible with active demyelination. Active demyelination is also questioned at sites of smaller lesions within the right centrum semiovale/corona radiata measuring up to 7 mm. MRI orbits: 1. Motion degraded examination, as described and limiting evaluation. 2. Findings compatible with acute left optic neuritis, as  described. 3. Within the limitations of motion degradation, no definite signal abnormality or abnormal enhancement of the right optic nerve. 4. Paranasal sinus disease as described. Electronically Signed   By: Kellie Simmering DO   On: 02/22/2021 19:28   MR ORBITS W WO CONTRAST  Result Date: 02/22/2021 CLINICAL DATA:  Vision loss, monocular. EXAM: MRI HEAD AND ORBITS WITHOUT AND WITH CONTRAST TECHNIQUE: Multiplanar, multiecho pulse sequences of the brain and surrounding structures were obtained without and with intravenous contrast. Multiplanar, multiecho pulse sequences of the orbits and surrounding structures were obtained including fat saturation techniques, before and after intravenous contrast administration. CONTRAST:  59mL GADAVIST GADOBUTROL 1 MMOL/ML IV SOLN COMPARISON:  No pertinent prior exams available for comparison. FINDINGS: MRI HEAD FINDINGS Brain: Mild-to-moderate intermittent motion degradation, somewhat limiting evaluation. Cerebral volume is normal for age. There is multifocal T2/FLAIR hyperintensity within the subcortical/juxtacortical and deep periventricular white matter. Many of the foci of signal abnormality are oriented perpendicular to the long axis of the lateral ventricles and findings are highly suggestive of a demyelinating process such as multiple sclerosis. A few foci of signal abnormality are also noted within the corpus callosum. Several lesions within the left centrum semiovale/corona radiata measuring up to 11 mm demonstrate corresponding restricted diffusion and subtle enhancement, and findings are compatible with active demyelination. Additionally, subtle corresponding restricted diffusion is questioned at sites of small lesions within the right centrum semiovale/corona radiata measuring up to 7 mm and active demyelination may also be present at these sites. No posterior fossa lesion is identified. No evidence of intracranial mass. No chronic intracranial blood products. No  extra-axial fluid collection. No midline shift. Vascular: Expected proximal arterial flow voids. Skull and upper cervical spine: No focal marrow lesion. MRI ORBITS FINDINGS The examination is intermittently motion degraded, limiting evaluation. Most notably, there is moderate/severe motion degradation of the coronal T2 weighted sequence and moderate motion degradation of the T1 weighted postcontrast sequences. Orbits: The globes  are normal in size and contour. There is T2 hyperintense signal abnormality within portions of the intraorbital left optic nerve with associated abnormal enhancement (for instance as seen on series 20, image 17) (series 25, images 16-18). Findings are compatible with acute optic neuritis. Within the limitations of motion degradation, there is no appreciable signal abnormality or abnormal enhancement within the right optic nerve. The extraocular muscles and optic nerve sheath complexes are symmetric and unremarkable. Visualized sinuses: Paranasal sinus mucosal thickening at the imaged levels. Most notably, there is moderate left ethmoid sinus mucosal thickening. Small right maxillary sinus mucous retention cyst. Soft tissues: No appreciable abnormality of the visualized maxillofacial or upper neck soft tissues. IMPRESSION: MRI brain: 1. Motion degraded exam as described. 2. Multifocal T2/FLAIR hyperintensity within the bilateral subcortical/juxtacortical and deep periventricular white matter, and also within the corpus callosum. These foci have an appearance highly suggestive of a demyelinating process such as multiple sclerosis. There is restricted diffusion and enhancement associated with several lesions within the left centrum semiovale/corona radiata measuring up to 11 mm, and findings are compatible with active demyelination. Active demyelination is also questioned at sites of smaller lesions within the right centrum semiovale/corona radiata measuring up to 7 mm. MRI orbits: 1. Motion  degraded examination, as described and limiting evaluation. 2. Findings compatible with acute left optic neuritis, as described. 3. Within the limitations of motion degradation, no definite signal abnormality or abnormal enhancement of the right optic nerve. 4. Paranasal sinus disease as described. Electronically Signed   By: Kellie Simmering DO   On: 02/22/2021 19:28    Procedures .Critical Care Performed by: Domenic Moras, PA-C Authorized by: Domenic Moras, PA-C   Critical care provider statement:    Critical care time (minutes):  38   Critical care was time spent personally by me on the following activities:  Discussions with consultants, evaluation of patient's response to treatment, examination of patient, ordering and performing treatments and interventions, ordering and review of laboratory studies, ordering and review of radiographic studies, pulse oximetry, re-evaluation of patient's condition, obtaining history from patient or surrogate and review of old charts     Medications Ordered in ED Medications  methylPREDNISolone sodium succinate (SOLU-MEDROL) 1,000 mg in sodium chloride 0.9 % 50 mL IVPB (has no administration in time range)  gadobutrol (GADAVIST) 1 MMOL/ML injection 10 mL (10 mLs Intravenous Contrast Given 02/22/21 1835)    ED Course  I have reviewed the triage vital signs and the nursing notes.  Pertinent labs & imaging results that were available during my care of the patient were reviewed by me and considered in my medical decision making (see chart for details).    MDM Rules/Calculators/A&P                          BP (!) 130/99   Pulse 72   Temp 97.9 F (36.6 C) (Oral)   Resp 18   Ht 5\' 11"  (1.803 m)   Wt 117.9 kg   SpO2 100%   BMI 36.26 kg/m   Final Clinical Impression(s) / ED Diagnoses Final diagnoses:  Vision loss of left eye  Optic neuritis, left  Multiple sclerosis ([place])    Rx / DC Orders ED Discharge Orders    None     4:03 PM Patient with  stable days worth of monocular visual loss affecting the left eye with some associated headache.  Was initially seen today by ophthalmologist Dr. Manuella Ghazi for her complaint.  She was noted to have edema of the optic disc of the left eye along with pain with eye movement and a drop in patient consistent with optic neuritis.  Recommend evaluating in the ED with brain and orbit MRI with and without contrast.  If MRI is positive patient will likely need admission for IV Solu-Medrol per his recommendation.  7:52 PM MRI of the brain demonstrate demyelinating process such as MS and MRI of the orbit demonstrate finding consistent with acute left optic neuritis.  This finding is consistent with patient's presentation.  Will consult neurology and will have patient admitted for further management.  We will likely start on high-dose steroid per neurology recommendation.  Care discussed with Dr. Maryan Rued  8:15 PM Appreciate consultation from on-call neurologist Dr. Rory Percy, who have reviewed patient's MRI results and recommend initiating high-dose steroids Solu-Medrol 1 g daily x5 days.  He also recommend obtaining UA and chest x-ray to ensure patient does not have any ongoing active infection that would need further treatment while she is on high-dose steroid.  He also recommend UDS as well.  He plan to  see patient sometime tonight.  Will consult medicine for admission.  8:28 PM Appreciate consultation from [facility] Dr. Tonie Griffith who agrees to see and admit pt for further management of her optic neuritis 2/2 MS.  Pt is aware and agrees with plan.    Domenic Moras, PA-C 02/22/21 2030    Blanchie Dessert, MD 02/26/21 [phone]

## 2021-02-23 ENCOUNTER — Inpatient Hospital Stay (HOSPITAL_COMMUNITY): Payer: No Typology Code available for payment source

## 2021-02-23 DIAGNOSIS — H469 Unspecified optic neuritis: Secondary | ICD-10-CM | POA: Diagnosis not present

## 2021-02-23 LAB — CBC
HCT: 33.7 % — ABNORMAL LOW (ref 36.0–46.0)
Hemoglobin: 10.4 g/dL — ABNORMAL LOW (ref 12.0–15.0)
MCH: 25.4 pg — ABNORMAL LOW (ref 26.0–34.0)
MCHC: 30.9 g/dL (ref 30.0–36.0)
MCV: 82.2 fL (ref 80.0–100.0)
Platelets: 242 10*3/uL (ref 150–400)
RBC: 4.1 MIL/uL (ref 3.87–5.11)
RDW: 17.2 % — ABNORMAL HIGH (ref 11.5–15.5)
WBC: 5.6 10*3/uL (ref 4.0–10.5)
nRBC: 0 % (ref 0.0–0.2)

## 2021-02-23 LAB — BASIC METABOLIC PANEL
Anion gap: 7 (ref 5–15)
BUN: 14 mg/dL (ref 6–20)
CO2: 22 mmol/L (ref 22–32)
Calcium: 8.7 mg/dL — ABNORMAL LOW (ref 8.9–10.3)
Chloride: 110 mmol/L (ref 98–111)
Creatinine, Ser: 0.57 mg/dL (ref 0.44–1.00)
GFR, Estimated: >60 mL/min (ref >60)
Glucose, Bld: 140 mg/dL — ABNORMAL HIGH (ref 70–99)
Potassium: 3.9 mmol/L (ref 3.5–5.1)
Sodium: 139 mmol/L (ref 135–145)

## 2021-02-23 LAB — RESP PANEL BY RT-PCR (FLU A&B, COVID) ARPGX2
Influenza A by PCR: NEGATIVE
Influenza B by PCR: NEGATIVE
SARS Coronavirus 2 by RT PCR: POSITIVE — AB

## 2021-02-23 LAB — VITAMIN B12: Vitamin B-12: 152 pg/mL — ABNORMAL LOW (ref 180–914)

## 2021-02-23 MED ORDER — OXYCODONE HCL 5 MG PO TABS
5.0000 mg | ORAL_TABLET | Freq: Four times a day (QID) | ORAL | Status: AC | PRN
  Administered 2021-02-23 – 2021-02-24 (×2): 5 mg via ORAL
  Filled 2021-02-23 (×2): qty 1

## 2021-02-23 MED ORDER — ONDANSETRON HCL 4 MG/2ML IJ SOLN
4.0000 mg | Freq: Three times a day (TID) | INTRAMUSCULAR | Status: DC | PRN
  Administered 2021-02-23 – 2021-02-26 (×8): 4 mg via INTRAVENOUS
  Filled 2021-02-23 (×8): qty 2

## 2021-02-23 MED ORDER — GADOBUTROL 1 MMOL/ML IV SOLN
10.0000 mL | Freq: Once | INTRAVENOUS | Status: AC | PRN
  Administered 2021-02-23: 10 mL via INTRAVENOUS

## 2021-02-23 MED ORDER — SALINE SPRAY 0.65 % NA SOLN
1.0000 | NASAL | Status: DC | PRN
  Administered 2021-02-23: 1 via NASAL
  Filled 2021-02-23: qty 44

## 2021-02-23 MED ORDER — PANTOPRAZOLE SODIUM 40 MG PO TBEC
40.0000 mg | DELAYED_RELEASE_TABLET | Freq: Every day | ORAL | Status: DC
  Administered 2021-02-23 – 2021-02-26 (×4): 40 mg via ORAL
  Filled 2021-02-23 (×4): qty 1

## 2021-02-23 NOTE — Consult Note (Addendum)
Neurology Consultation  Reason for Consult: Left eye visual loss Referring Physician: Domenic Moras, PA-c  CC: Headache, visual changes  History is obtained from: Patient, chart  HPI: Lynn Roberts is a 35 y.o. female past medical history migraines, sleep apnea, obesity,, presented to the emergency room from her ophthalmologist office where she was seen for evaluation of left eye vision loss. Starting last Wednesday, she had a headache and some blurred vision.  She had her daughter in the hospital undergoing surgery and she attributed her headaches and visual symptoms lack of sleep, stress and allergies.  Symptoms did not improve and worsened to the point where out of her left eye, she could barely see anything.  When it started, her acuity was good enough to make out shapes and faces but over the next few days it has only deteriorated and at this point she can barely perceive light from the left eye.  She reports a headache around the left eye and pain on eye movement.  The headache is getting better but the pain with eye movement persists. She was seen by ophthalmology, and there was concern for disc edema in the left eye concerning for retrobulbar neuritis and she was sent to the ED for MRI which showed left optic neuritis as well as brain lesions suggestive of demyelinating process. Case was discussed with me earlier in the evening over the phone and I recommended that she be started with 5 days of IV steroids after checking urinalysis and chest x-ray to make sure there is no infection ongoing. I saw the patient a little after midnight, comfortably sitting in her bed, providing all the history. Denies any preceding illnesses or sicknesses, no fevers chills.  No family history of demyelinating disorders. Has not had any episodes of tingling numbness or weakness that lasted a few days and then resolved in the past.  Does not report of any electric shocklike sensation going down the spine.  Has noted  no gait abnormalities.  No bowel or bladder issues.  ROS: Performed and negative except as noted in HPI  Past Medical History:  Diagnosis Date  . Arthritis of low back   . Complication of anesthesia    is of [name] descent  . Family history of adverse reaction to anesthesia    pt is a [name]  . GERD (gastroesophageal reflux disease)    none since pregnancy   . Hemorrhoids   . History of PAT (paroxysmal atrial tachycardia)    had cardiac ablation as a teenager  . History of sleep apnea    none since 450 pound weight loss in 2015  . Migraines   . PCOD (polycystic ovarian disease)    no menses    Family History  Problem Relation Age of Onset  . Cancer Paternal Grandfather   . Cancer Maternal Grandmother   . Heart disease Maternal Grandmother   . Diabetes Maternal Grandfather   . Hypertension Father   . Hyperlipidemia Father   . Hypertension Mother   . Diabetes Mother      Social History:   reports that she has been smoking cigarettes. She has a 5.00 pack-year smoking history. She has never used smokeless tobacco. She reports that she does not drink alcohol and does not use drugs.  Medications  Current Facility-Administered Medications:  .  0.9 %  sodium chloride infusion, 250 mL, Intravenous, PRN, Chotiner, Yevonne Aline, MD .  acetaminophen (TYLENOL) tablet 650 mg, 650 mg, Oral, Q6H PRN, 650 mg  at 02/22/21 2326 **OR** acetaminophen (TYLENOL) suppository 650 mg, 650 mg, Rectal, Q6H PRN, Chotiner, Yevonne Aline, MD .  methylPREDNISolone sodium succinate (SOLU-MEDROL) 1,000 mg in sodium chloride 0.9 % 50 mL IVPB, 1,000 mg, Intravenous, Daily, Chotiner, Yevonne Aline, MD, Last Rate: 58 mL/hr at 02/22/21 2133, 1,000 mg at 02/22/21 2133 .  multivitamin with minerals tablet 1 tablet, 1 tablet, Oral, Daily, Chotiner, Yevonne Aline, MD .  nicotine (NICODERM CQ - dosed in mg/24 hr) patch 7 mg, 7 mg, Transdermal, Daily, Chotiner, Yevonne Aline, MD .  sodium chloride flush (NS) 0.9 % injection 3  mL, 3 mL, Intravenous, Q12H, Chotiner, Yevonne Aline, MD, 3 mL at 02/22/21 2328 .  sodium chloride flush (NS) 0.9 % injection 3 mL, 3 mL, Intravenous, PRN, Chotiner, Yevonne Aline, MD   Exam: Current vital signs: BP (!) 146/86 (BP Location: Left Arm)   Pulse 67   Temp 98 F (36.7 C) (Oral)   Resp 18   Ht 5\' 11"  (1.803 m)   Wt 117.9 kg   SpO2 100%   BMI 36.26 kg/m  Vital signs in last 24 hours: Temp:  [97.9 F (36.6 C)-98.1 F (36.7 C)] 98 F (36.7 C) (03/24 2231) Pulse Rate:  [55-77] 67 (03/24 2231) Resp:  [16-18] 18 (03/24 2231) BP: (130-146)/(81-99) 146/86 (03/24 2231) SpO2:  [98 %-100 %] 100 % (03/24 2231) Weight:  [117.9 kg] 117.9 kg (03/24 1603)  GENERAL: Awake, alert in NAD HEENT: - Normocephalic and atraumatic, dry mm, no LN++, no Thyromegally LUNGS - Clear to auscultation bilaterally with no wheezes CV - S1S2 RRR, no m/r/g, equal pulses bilaterally. ABDOMEN - Soft, nontender, nondistended with normoactive BS Ext: warm, well perfused, intact peripheral pulses, no edema  NEURO:  Mental Status: AA&Ox3  Language: speech is clear.  Naming, repetition, fluency, and comprehension intact. Cranial Nerves: Pupils are round and reactive although left pupil is not as brisk as the right.  No significant APD.  Fundus difficult to visualize.  According to the ophthalmology note, disc edema more notably superiorly OS.Marland Kitchen EOMI, visual fields full, no facial asymmetry, facial sensation intact, hearing intact, tongue/uvula/soft palate midline, normal sternocleidomastoid and trapezius muscle strength. No evidence of tongue atrophy or fibrillations Motor: 5/5 with no drift in all fours Tone: is normal and bulk is normal Sensation- Intact to light touch bilaterally Coordination: FTN intact bilaterally, no ataxia in BLE. Gait-normal DTRs: Mildly brisk right knee jerk, otherwise 2+.  Downgoing toes bilaterally.    Labs I have reviewed labs in epic and the results pertinent to this consultation  are:  CBC    Component Value Date/Time   WBC 6.3 02/22/2021 1615   RBC 4.33 02/22/2021 1615   HGB 12.2 02/22/2021 1622   HGB 8.9 (L) 03/03/2020 1452   HGB 11.2 01/26/2018 0909   HCT 36.0 02/22/2021 1622   HCT 34.5 01/26/2018 0909   PLT 272 02/22/2021 1615   PLT 219 03/03/2020 1452   PLT 214 01/26/2018 0909   MCV 82.7 02/22/2021 1615   MCV 88 01/26/2018 0909   MCH 25.4 (L) 02/22/2021 1615   MCHC 30.7 02/22/2021 1615   RDW 17.6 (H) 02/22/2021 1615   RDW 13.7 01/26/2018 0909   LYMPHSABS 2.1 02/22/2021 1615   MONOABS 0.2 02/22/2021 1615   EOSABS 0.1 02/22/2021 1615   BASOSABS 0.0 02/22/2021 1615    CMP     Component Value Date/Time   NA 145 02/22/2021 1622   NA 136 10/12/2019 1030   K 3.3 (L) 02/22/2021 1622  CL 106 02/22/2021 1622   CO2 24 08/15/2020 0354   GLUCOSE 86 02/22/2021 1622   BUN 8 02/22/2021 1622   BUN 8 10/12/2019 1030   CREATININE 0.70 02/22/2021 1622   CREATININE 0.62 03/03/2020 1452   CALCIUM 8.3 (L) 08/15/2020 0354   PROT 7.4 08/14/2020 1829   ALBUMIN 3.4 (L) 08/14/2020 1829   AST 11 (L) 08/14/2020 1829   AST 10 (L) 03/03/2020 1452   ALT 10 08/14/2020 1829   ALT 10 03/03/2020 1452   ALKPHOS 66 08/14/2020 1829   BILITOT 0.6 08/14/2020 1829   BILITOT 0.4 03/03/2020 1452   GFRNONAA >60 08/15/2020 0354   GFRNONAA >60 03/03/2020 1452   GFRAA >60 08/15/2020 0354   GFRAA >60 03/03/2020 1452  Urinalysis negative for UTI  Imaging I have reviewed the images obtained: MRI brain and orbits with and without contrast-Both motion degraded. Brain imaging shows multiple T2/flair hyperintense lesions in the bilateral subcortical ingesta cortical and deep periventricular white matter and also along with corpus callosum, these foci have an appearance highly suggestive of a demyelinating process such as multiple sclerosis.  There is restricted diffusion and enhancement noted with several of these lesions suggesting active demyelination. MRI of the orbits shows  finding compatible with acute optic neuritis on the left.  Chest x-ray clear.  Assessment:  35 year old with above past medical history with now about 7 to 8 days worth of worsening monocular painful vision loss and edema of the optic disc of the left eye seen on ophthalmological exam, followed by MRI of the brain and orbits that showed demyelinating lesions in the brain and optic neuritis of the left optic nerve. Highly consistent with demyelinating process such as multiple sclerosis.  Impression: Left optic neuritis New onset demyelination-differentials include MS, and MO, anti-MOg  Recommendations: 5 days of high-dose steroids-1 g Solu-Medrol IV daily. Use PPIs while on steroids. MRI C-spine and T spine with and without contrast Check neuromyelitis optica antibodies and anti-MOG antibodies. Check serum angiotensin-converting enzyme, B12 levels. Outpatient follow-up with Dr. Felecia Shelling at [facility] neurology for discussion on long-term disease modifying therapy. Plan discussed with B. Haywood Pao Inpatient neurology at [facility] will follow the imaging and tests as made available-and update recommendations as necessary.  -- Amie Portland, MD Neurologist [facility] Pager: [phone]

## 2021-02-23 NOTE — Progress Notes (Signed)
Received patient at [address] from [address].  No complaints noted

## 2021-02-23 NOTE — Progress Notes (Signed)
PROGRESS NOTE    Lynn Roberts  HER:[id number] DOB: 03/04/1986 DOA: 02/22/2021 PCP: Patient, No Pcp Per   Brief Narrative:  Lynn Roberts is a 35 y.o. female with medical history significant for  paroxysmal atrial tachycardia, iron deficiency anemia, presenting with complaints of vision loss. Patient report approximately a week ago she developed some blurry vision in her left eye. She reports developing pain behind her left eye a few days ago. She reports having a dull headache on left and her vision progressed from being blurry in left eye to complete vision loss in left eye today prompting her to go to optomitrist. States that she sees nothing but black in her left eye. Headache since resolved however visual change persist. No associated double vision, no focal numbness or focal weakness. She initially thought it may be due to sinus pressure and allergies. She denies any prior visual impairment. She does not wear contact lenses or prescription glasses. She went to see an eye specialist today at [facility] and was evaluated by ophthalmologist, Dr. Tama High.States that she had an eye exam and was recommended to come to the ER for further work-up which include brain orbit MRI for further assessment. Patient otherwise denies any recent injury and denies any history of prior stroke. She is a smoker and smoked half a pack of cigarettes a day. In ED: MRI revealed the old demyelinating lesions in the brain and optic neuritis consistent with multiple sclerosis.  Remainder of work-up was otherwise unremarkable.  Patient was started on high-dose Solu-Medrol the emergency room and neurology recommended admission to medicine service.   Assessment & Plan:   Principal Problem:   Optic neuritis Active Problems:   Demyelinating changes in brain (HCC)   Hypokalemia   Acute optic neuritis, POA Neurology consulted - started on high dose steroid with Solu-Medrol 1 g IV daily  for 5 days per recommendations Follow visual field changes/improvement qshift -improving minimally over the past 12 hours  Demyelinating changes in brain, unspecified Patient with demyelinating lesions on MRI of brain concerning for multiple sclerosis.  MRI of the T and L-spine pending Awaiting neurology evaluation and recommendations  Hypokalemia, resolved Follow morning labs  Dermatitis  Pt has dry cracking skin which could be due to vitamin deficiency as pt had gastric bypass surgery 8 yrs ago and is not taking MVI at home. Start MVI. Could also be contact dermatitis from frequent hand sanitizer use and frequent washing of hands. Use daily moisturizer  DVT prophylaxis: SCDs/early ambulation Code Status: Full Family Communication: At bedside  Status is: Inpatient  Dispo: The patient is from: Home              Anticipated d/c is to: Home              Anticipated d/c date is: 72 to 96 hours pending clinical course              Patient currently not medically stable for discharge  Consultants:   Neurology  Procedures:   None  Antimicrobials:  None  Subjective: No acute issues or events overnight, steroids ongoing per neurology, vision marginally improved overnight.  Objective: Vitals:   02/22/21 2200 02/22/21 2231 02/23/21 0116 02/23/21 0537  BP: 138/86 (!) 146/86 123/70 133/69  Pulse: 70 67 65 62  Resp: 16 18  16   Temp: 98.1 F (36.7 C) 98 F (36.7 C) 98 F (36.7 C) 97.8 F (36.6 C)  TempSrc:  Oral Oral Oral  SpO2: 100% 100% 96% 97%  Weight:      Height:        Intake/Output Summary (Last 24 hours) at 02/23/2021 1937 Last data filed at 02/22/2021 2200 Gross per 24 hour  Intake 25.26 ml  Output --  Net 25.26 ml   Filed Weights   02/22/21 1603  Weight: 117.9 kg    Examination:  General exam: Appears calm and comfortable  Respiratory system: Clear to auscultation. Respiratory effort normal. Cardiovascular system: S1 & S2 heard, RRR. No JVD,  murmurs, rubs, gallops or clicks. No pedal edema. Gastrointestinal system: Abdomen is nondistended, soft and nontender. No organomegaly or masses felt. Normal bowel sounds heard. Central nervous system: Alert and oriented. No focal neurological deficits. Extremities: Symmetric 5 x 5 power. Skin: No rashes, lesions or ulcers Psychiatry: Judgement and insight appear normal. Mood & affect appropriate.   Data Reviewed: I have personally reviewed following labs and imaging studies  CBC: Recent Labs  Lab 02/22/21 1615 02/22/21 1622 02/23/21 0450  WBC 6.3  --  5.6  NEUTROABS 3.9  --   --   HGB 11.0* 12.2 10.4*  HCT 35.8* 36.0 33.7*  MCV 82.7  --  82.2  PLT 272  --  902   Basic Metabolic Panel: Recent Labs  Lab 02/22/21 1615 02/22/21 1622 02/23/21 0450  NA  --  145 139  K  --  3.3* 3.9  CL  --  106 110  CO2  --   --  22  GLUCOSE  --  86 140*  BUN  --  8 14  CREATININE  --  0.70 0.57  CALCIUM  --   --  8.7*  MG 2.0  --   --    GFR: Estimated Creatinine Clearance: 137.5 mL/min (by C-G formula based on SCr of 0.57 mg/dL). Liver Function Tests: No results for input(s): AST, ALT, ALKPHOS, BILITOT, PROT, ALBUMIN in the last 168 hours. No results for input(s): LIPASE, AMYLASE in the last 168 hours. No results for input(s): AMMONIA in the last 168 hours. Coagulation Profile: No results for input(s): INR, PROTIME in the last 168 hours. Cardiac Enzymes: No results for input(s): CKTOTAL, CKMB, CKMBINDEX, TROPONINI in the last 168 hours. BNP (last 3 results) No results for input(s): PROBNP in the last 8760 hours. HbA1C: No results for input(s): HGBA1C in the last 72 hours. CBG: No results for input(s): GLUCAP in the last 168 hours. Lipid Profile: No results for input(s): CHOL, HDL, LDLCALC, TRIG, CHOLHDL, LDLDIRECT in the last 72 hours. Thyroid Function Tests: No results for input(s): TSH, T4TOTAL, FREET4, T3FREE, THYROIDAB in the last 72 hours. Anemia Panel: Recent Labs     02/23/21 0450  VITAMINB12 152*   Sepsis Labs: No results for input(s): PROCALCITON, LATICACIDVEN in the last 168 hours.  Recent Results (from the past 240 hour(s))  Resp Panel by RT-PCR (Flu A&B, Covid) Nasopharyngeal Swab     Status: Abnormal   Collection Time: 02/22/21 11:08 PM   Specimen: Nasopharyngeal Swab; Nasopharyngeal(NP) swabs in vial transport medium  Result Value Ref Range Status   SARS Coronavirus 2 by RT PCR POSITIVE (A) NEGATIVE Final    Comment: RESULT CALLED TO, READ BACK BY AND VERIFIED WITH: CAITLYN, RN @ [phone] ON 02/23/21 C VARNER (NOTE) SARS-CoV-2 target nucleic acids are DETECTED.  The SARS-CoV-2 RNA is generally detectable in upper respiratory specimens during the acute phase of infection. Positive results are indicative of the presence of the identified virus, but do not rule out  bacterial infection or co-infection with other pathogens not detected by the test. Clinical correlation with patient history and other diagnostic information is necessary to determine patient infection status. The expected result is Negative.  Fact Sheet for Patients: EntrepreneurPulse.com.au  Fact Sheet for Healthcare Providers: IncredibleEmployment.be  This test is not yet approved or cleared by the [place] FDA and  has been authorized for detection and/or diagnosis of SARS-CoV-2 by FDA under an Emergency Use Authorization (EUA).  This EUA will remain in effect (meaning this test c an be used) for the duration of  the COVID-19 declaration under Section 564(b)(1) of the Act, 21 U.S.C. section 360bbb-3(b)(1), unless the authorization is terminated or revoked sooner.     Influenza A by PCR NEGATIVE NEGATIVE Final   Influenza B by PCR NEGATIVE NEGATIVE Final    Comment: (NOTE) The Xpert Xpress SARS-CoV-2/FLU/RSV plus assay is intended as an aid in the diagnosis of influenza from Nasopharyngeal swab specimens and should not be used as a  sole basis for treatment. Nasal washings and aspirates are unacceptable for Xpert Xpress SARS-CoV-2/FLU/RSV testing.  Fact Sheet for Patients: EntrepreneurPulse.com.au  Fact Sheet for Healthcare Providers: IncredibleEmployment.be  This test is not yet approved or cleared by the [place] FDA and has been authorized for detection and/or diagnosis of SARS-CoV-2 by FDA under an Emergency Use Authorization (EUA). This EUA will remain in effect (meaning this test can be used) for the duration of the COVID-19 declaration under Section 564(b)(1) of the Act, 21 U.S.C. section 360bbb-3(b)(1), unless the authorization is terminated or revoked.  Performed at [facility], [place] [address], [place], [place] [address]          Radiology Studies: MR Brain W and Wo Contrast  Result Date: 02/22/2021 CLINICAL DATA:  Vision loss, monocular. EXAM: MRI HEAD AND ORBITS WITHOUT AND WITH CONTRAST TECHNIQUE: Multiplanar, multiecho pulse sequences of the brain and surrounding structures were obtained without and with intravenous contrast. Multiplanar, multiecho pulse sequences of the orbits and surrounding structures were obtained including fat saturation techniques, before and after intravenous contrast administration. CONTRAST:  86mL GADAVIST GADOBUTROL 1 MMOL/ML IV SOLN COMPARISON:  No pertinent prior exams available for comparison. FINDINGS: MRI HEAD FINDINGS Brain: Mild-to-moderate intermittent motion degradation, somewhat limiting evaluation. Cerebral volume is normal for age. There is multifocal T2/FLAIR hyperintensity within the subcortical/juxtacortical and deep periventricular white matter. Many of the foci of signal abnormality are oriented perpendicular to the long axis of the lateral ventricles and findings are highly suggestive of a demyelinating process such as multiple sclerosis. A few foci of signal abnormality are also noted within the  corpus callosum. Several lesions within the left centrum semiovale/corona radiata measuring up to 11 mm demonstrate corresponding restricted diffusion and subtle enhancement, and findings are compatible with active demyelination. Additionally, subtle corresponding restricted diffusion is questioned at sites of small lesions within the right centrum semiovale/corona radiata measuring up to 7 mm and active demyelination may also be present at these sites. No posterior fossa lesion is identified. No evidence of intracranial mass. No chronic intracranial blood products. No extra-axial fluid collection. No midline shift. Vascular: Expected proximal arterial flow voids. Skull and upper cervical spine: No focal marrow lesion. MRI ORBITS FINDINGS The examination is intermittently motion degraded, limiting evaluation. Most notably, there is moderate/severe motion degradation of the coronal T2 weighted sequence and moderate motion degradation of the T1 weighted postcontrast sequences. Orbits: The globes are normal in size and contour. There is T2 hyperintense signal abnormality within  portions of the intraorbital left optic nerve with associated abnormal enhancement (for instance as seen on series 20, image 17) (series 25, images 16-18). Findings are compatible with acute optic neuritis. Within the limitations of motion degradation, there is no appreciable signal abnormality or abnormal enhancement within the right optic nerve. The extraocular muscles and optic nerve sheath complexes are symmetric and unremarkable. Visualized sinuses: Paranasal sinus mucosal thickening at the imaged levels. Most notably, there is moderate left ethmoid sinus mucosal thickening. Small right maxillary sinus mucous retention cyst. Soft tissues: No appreciable abnormality of the visualized maxillofacial or upper neck soft tissues. IMPRESSION: MRI brain: 1. Motion degraded exam as described. 2. Multifocal T2/FLAIR hyperintensity within the  bilateral subcortical/juxtacortical and deep periventricular white matter, and also within the corpus callosum. These foci have an appearance highly suggestive of a demyelinating process such as multiple sclerosis. There is restricted diffusion and enhancement associated with several lesions within the left centrum semiovale/corona radiata measuring up to 11 mm, and findings are compatible with active demyelination. Active demyelination is also questioned at sites of smaller lesions within the right centrum semiovale/corona radiata measuring up to 7 mm. MRI orbits: 1. Motion degraded examination, as described and limiting evaluation. 2. Findings compatible with acute left optic neuritis, as described. 3. Within the limitations of motion degradation, no definite signal abnormality or abnormal enhancement of the right optic nerve. 4. Paranasal sinus disease as described. Electronically Signed   By: Kellie Simmering DO   On: 02/22/2021 19:28   DG Chest Portable 1 View  Result Date: 02/22/2021 CLINICAL DATA:  Loss of vision left eye EXAM: PORTABLE CHEST 1 VIEW COMPARISON:  October 18, 2016 FINDINGS: Lungs are clear. Heart size and pulmonary vascularity are normal. No adenopathy. No bone lesions. IMPRESSION: Lungs clear.  Heart size normal. Electronically Signed   By: Lowella Grip III M.D.   On: 02/22/2021 21:12   MR ORBITS W WO CONTRAST  Result Date: 02/22/2021 CLINICAL DATA:  Vision loss, monocular. EXAM: MRI HEAD AND ORBITS WITHOUT AND WITH CONTRAST TECHNIQUE: Multiplanar, multiecho pulse sequences of the brain and surrounding structures were obtained without and with intravenous contrast. Multiplanar, multiecho pulse sequences of the orbits and surrounding structures were obtained including fat saturation techniques, before and after intravenous contrast administration. CONTRAST:  70mL GADAVIST GADOBUTROL 1 MMOL/ML IV SOLN COMPARISON:  No pertinent prior exams available for comparison. FINDINGS: MRI HEAD  FINDINGS Brain: Mild-to-moderate intermittent motion degradation, somewhat limiting evaluation. Cerebral volume is normal for age. There is multifocal T2/FLAIR hyperintensity within the subcortical/juxtacortical and deep periventricular white matter. Many of the foci of signal abnormality are oriented perpendicular to the long axis of the lateral ventricles and findings are highly suggestive of a demyelinating process such as multiple sclerosis. A few foci of signal abnormality are also noted within the corpus callosum. Several lesions within the left centrum semiovale/corona radiata measuring up to 11 mm demonstrate corresponding restricted diffusion and subtle enhancement, and findings are compatible with active demyelination. Additionally, subtle corresponding restricted diffusion is questioned at sites of small lesions within the right centrum semiovale/corona radiata measuring up to 7 mm and active demyelination may also be present at these sites. No posterior fossa lesion is identified. No evidence of intracranial mass. No chronic intracranial blood products. No extra-axial fluid collection. No midline shift. Vascular: Expected proximal arterial flow voids. Skull and upper cervical spine: No focal marrow lesion. MRI ORBITS FINDINGS The examination is intermittently motion degraded, limiting evaluation. Most notably, there is moderate/severe motion degradation  of the coronal T2 weighted sequence and moderate motion degradation of the T1 weighted postcontrast sequences. Orbits: The globes are normal in size and contour. There is T2 hyperintense signal abnormality within portions of the intraorbital left optic nerve with associated abnormal enhancement (for instance as seen on series 20, image 17) (series 25, images 16-18). Findings are compatible with acute optic neuritis. Within the limitations of motion degradation, there is no appreciable signal abnormality or abnormal enhancement within the right optic  nerve. The extraocular muscles and optic nerve sheath complexes are symmetric and unremarkable. Visualized sinuses: Paranasal sinus mucosal thickening at the imaged levels. Most notably, there is moderate left ethmoid sinus mucosal thickening. Small right maxillary sinus mucous retention cyst. Soft tissues: No appreciable abnormality of the visualized maxillofacial or upper neck soft tissues. IMPRESSION: MRI brain: 1. Motion degraded exam as described. 2. Multifocal T2/FLAIR hyperintensity within the bilateral subcortical/juxtacortical and deep periventricular white matter, and also within the corpus callosum. These foci have an appearance highly suggestive of a demyelinating process such as multiple sclerosis. There is restricted diffusion and enhancement associated with several lesions within the left centrum semiovale/corona radiata measuring up to 11 mm, and findings are compatible with active demyelination. Active demyelination is also questioned at sites of smaller lesions within the right centrum semiovale/corona radiata measuring up to 7 mm. MRI orbits: 1. Motion degraded examination, as described and limiting evaluation. 2. Findings compatible with acute left optic neuritis, as described. 3. Within the limitations of motion degradation, no definite signal abnormality or abnormal enhancement of the right optic nerve. 4. Paranasal sinus disease as described. Electronically Signed   By: Kellie Simmering DO   On: 02/22/2021 19:28    Scheduled Meds: . multivitamin with minerals  1 tablet Oral Daily  . nicotine  7 mg Transdermal Daily  . pantoprazole  40 mg Oral Q0600  . sodium chloride flush  3 mL Intravenous Q12H   Continuous Infusions: . sodium chloride    . methylPREDNISolone (SOLU-MEDROL) injection 1,000 mg (02/22/21 2133)     LOS: 1 day   Time spent: [place], DO [facility]  If 7PM-7AM, please contact night-coverage [email or web address]  02/23/2021, 8:12 AM

## 2021-02-24 DIAGNOSIS — H469 Unspecified optic neuritis: Secondary | ICD-10-CM | POA: Diagnosis not present

## 2021-02-24 LAB — CBC
HCT: 32.1 % — ABNORMAL LOW (ref 36.0–46.0)
Hemoglobin: 9.8 g/dL — ABNORMAL LOW (ref 12.0–15.0)
MCH: 25.4 pg — ABNORMAL LOW (ref 26.0–34.0)
MCHC: 30.5 g/dL (ref 30.0–36.0)
MCV: 83.2 fL (ref 80.0–100.0)
Platelets: 235 10*3/uL (ref 150–400)
RBC: 3.86 MIL/uL — ABNORMAL LOW (ref 3.87–5.11)
RDW: 17.2 % — ABNORMAL HIGH (ref 11.5–15.5)
WBC: 8.7 10*3/uL (ref 4.0–10.5)
nRBC: 0 % (ref 0.0–0.2)

## 2021-02-24 LAB — COMPREHENSIVE METABOLIC PANEL
ALT: 11 U/L (ref 0–44)
AST: 12 U/L — ABNORMAL LOW (ref 15–41)
Albumin: 3.2 g/dL — ABNORMAL LOW (ref 3.5–5.0)
Alkaline Phosphatase: 46 U/L (ref 38–126)
Anion gap: 7 (ref 5–15)
BUN: 13 mg/dL (ref 6–20)
CO2: 24 mmol/L (ref 22–32)
Calcium: 8.6 mg/dL — ABNORMAL LOW (ref 8.9–10.3)
Chloride: 107 mmol/L (ref 98–111)
Creatinine, Ser: 0.55 mg/dL (ref 0.44–1.00)
GFR, Estimated: >60 mL/min (ref >60)
Glucose, Bld: 125 mg/dL — ABNORMAL HIGH (ref 70–99)
Potassium: 3.7 mmol/L (ref 3.5–5.1)
Sodium: 138 mmol/L (ref 135–145)
Total Bilirubin: 0.6 mg/dL (ref 0.3–1.2)
Total Protein: 6.1 g/dL — ABNORMAL LOW (ref 6.5–8.1)

## 2021-02-24 LAB — ANGIOTENSIN CONVERTING ENZYME: Angiotensin-Converting Enzyme: 56 U/L (ref 14–82)

## 2021-02-24 NOTE — Progress Notes (Signed)
PROGRESS NOTE    Lynn Roberts  ION:[id number] DOB: 10/18/1986 DOA: 02/22/2021 PCP: Patient, No Pcp Per   Brief Narrative:  Lynn Roberts is a 35 y.o. female with medical history significant for  paroxysmal atrial tachycardia, iron deficiency anemia, presenting with complaints of vision loss. Patient report approximately a week ago she developed some blurry vision in her left eye. She reports developing pain behind her left eye a few days ago. She reports having a dull headache on left and her vision progressed from being blurry in left eye to complete vision loss in left eye today prompting her to go to optomitrist. States that she sees nothing but black in her left eye. Headache since resolved however visual change persist. No associated double vision, no focal numbness or focal weakness. She initially thought it may be due to sinus pressure and allergies. She denies any prior visual impairment. She does not wear contact lenses or prescription glasses. She went to see an eye specialist today at [facility] and was evaluated by ophthalmologist, Dr. Tama High.States that she had an eye exam and was recommended to come to the ER for further work-up which include brain orbit MRI for further assessment. Patient otherwise denies any recent injury and denies any history of prior stroke. She is a smoker and smoked half a pack of cigarettes a day. In ED: MRI revealed the old demyelinating lesions in the brain and optic neuritis consistent with multiple sclerosis.  Remainder of work-up was otherwise unremarkable.  Patient was started on high-dose Solu-Medrol the emergency room and neurology recommended admission to medicine service.   Assessment & Plan:   Principal Problem:   Optic neuritis Active Problems:   Demyelinating changes in brain (HCC)   Hypokalemia  Acute optic neuritis, POA Neurology consulted - started on high dose steroid with Solu-Medrol 1 g IV daily for  5 days per recommendations Follow visual field changes/improvement qshift -improving minimally over the past 12 hours  Demyelinating changes in brain, unspecified Patient with demyelinating lesions on MRI of brain concerning for multiple sclerosis.  MRI of the T and L-spine grossly unremarkable, some motion degradation noted but without obvious cord signal to suggest demyelinating disease. Appreciate neurology evaluation and recommendations  Hypokalemia, resolved Follow morning labs, currently well controlled  Dermatitis, unspecified Questionably in the setting of malnutrition secondary to gastric bypass surgery some 8 yrs ago  Recommended to initiate multivitamin  Cannot rule out contact dermatitis from frequent hand sanitizer use and frequent washing of hands.  Continue to recommend use of daily moisturizer  DVT prophylaxis: SCDs/early ambulation Code Status: Full Family Communication: At bedside  Status is: Inpatient  Dispo: The patient is from: Home              Anticipated d/c is to: Home              Anticipated d/c date is: 72 to 96 hours pending clinical course and completion of steroids              Patient currently not medically stable for discharge  Consultants:   Neurology  Procedures:   None  Antimicrobials:  None  Subjective: No acute issues or events overnight, steroids ongoing per neurology, vision marginally improved overnight.  Objective: Vitals:   02/23/21 0537 02/23/21 1425 02/23/21 2107 02/24/21 0514  BP: 133/69 126/77 124/70 (!) 102/59  Pulse: 62 85 90 87  Resp: 16 18 16 17   Temp: 97.8 F (36.6 C) 97.8 F (36.6  C) 98.4 F (36.9 C) 98.1 F (36.7 C)  TempSrc: Oral Oral Oral Oral  SpO2: 97% 96% 95% 95%  Weight:      Height:        Intake/Output Summary (Last 24 hours) at 02/24/2021 0756 Last data filed at 02/23/2021 1800 Gross per 24 hour  Intake -  Output 600 ml  Net -600 ml   Filed Weights   02/22/21 1603  Weight: 117.9 kg     Examination:  General exam: Appears calm and comfortable  Respiratory system: Clear to auscultation. Respiratory effort normal. Cardiovascular system: S1 & S2 heard, RRR. No JVD, murmurs, rubs, gallops or clicks. No pedal edema. Gastrointestinal system: Abdomen is nondistended, soft and nontender. No organomegaly or masses felt. Normal bowel sounds heard. Central nervous system: Alert and oriented. No focal neurological deficits. Extremities: Symmetric 5 x 5 power. Skin: No rashes, lesions or ulcers Psychiatry: Judgement and insight appear normal. Mood & affect appropriate.   Data Reviewed: I have personally reviewed following labs and imaging studies  CBC: Recent Labs  Lab 02/22/21 1615 02/22/21 1622 02/23/21 0450 02/24/21 0347  WBC 6.3  --  5.6 8.7  NEUTROABS 3.9  --   --   --   HGB 11.0* 12.2 10.4* 9.8*  HCT 35.8* 36.0 33.7* 32.1*  MCV 82.7  --  82.2 83.2  PLT 272  --  242 782   Basic Metabolic Panel: Recent Labs  Lab 02/22/21 1615 02/22/21 1622 02/23/21 0450 02/24/21 0347  NA  --  145 139 138  K  --  3.3* 3.9 3.7  CL  --  106 110 107  CO2  --   --  22 24  GLUCOSE  --  86 140* 125*  BUN  --  8 14 13   CREATININE  --  0.70 0.57 0.55  CALCIUM  --   --  8.7* 8.6*  MG 2.0  --   --   --    GFR: Estimated Creatinine Clearance: 137.5 mL/min (by C-G formula based on SCr of 0.55 mg/dL). Liver Function Tests: Recent Labs  Lab 02/24/21 0347  AST 12*  ALT 11  ALKPHOS 46  BILITOT 0.6  PROT 6.1*  ALBUMIN 3.2*   No results for input(s): LIPASE, AMYLASE in the last 168 hours. No results for input(s): AMMONIA in the last 168 hours. Coagulation Profile: No results for input(s): INR, PROTIME in the last 168 hours. Cardiac Enzymes: No results for input(s): CKTOTAL, CKMB, CKMBINDEX, TROPONINI in the last 168 hours. BNP (last 3 results) No results for input(s): PROBNP in the last 8760 hours. HbA1C: No results for input(s): HGBA1C in the last 72 hours. CBG: No  results for input(s): GLUCAP in the last 168 hours. Lipid Profile: No results for input(s): CHOL, HDL, LDLCALC, TRIG, CHOLHDL, LDLDIRECT in the last 72 hours. Thyroid Function Tests: No results for input(s): TSH, T4TOTAL, FREET4, T3FREE, THYROIDAB in the last 72 hours. Anemia Panel: Recent Labs    02/23/21 0450  VITAMINB12 152*   Sepsis Labs: No results for input(s): PROCALCITON, LATICACIDVEN in the last 168 hours.  Recent Results (from the past 240 hour(s))  Resp Panel by RT-PCR (Flu A&B, Covid) Nasopharyngeal Swab     Status: Abnormal   Collection Time: 02/22/21 11:08 PM   Specimen: Nasopharyngeal Swab; Nasopharyngeal(NP) swabs in vial transport medium  Result Value Ref Range Status   SARS Coronavirus 2 by RT PCR POSITIVE (A) NEGATIVE Final    Comment: RESULT CALLED TO, READ BACK BY AND  VERIFIED WITH: CAITLYN, RN @ [phone] ON 02/23/21 C VARNER (NOTE) SARS-CoV-2 target nucleic acids are DETECTED.  The SARS-CoV-2 RNA is generally detectable in upper respiratory specimens during the acute phase of infection. Positive results are indicative of the presence of the identified virus, but do not rule out bacterial infection or co-infection with other pathogens not detected by the test. Clinical correlation with patient history and other diagnostic information is necessary to determine patient infection status. The expected result is Negative.  Fact Sheet for Patients: EntrepreneurPulse.com.au  Fact Sheet for Healthcare Providers: IncredibleEmployment.be  This test is not yet approved or cleared by the [place] FDA and  has been authorized for detection and/or diagnosis of SARS-CoV-2 by FDA under an Emergency Use Authorization (EUA).  This EUA will remain in effect (meaning this test c an be used) for the duration of  the COVID-19 declaration under Section 564(b)(1) of the Act, 21 U.S.C. section 360bbb-3(b)(1), unless the authorization  is terminated or revoked sooner.     Influenza A by PCR NEGATIVE NEGATIVE Final   Influenza B by PCR NEGATIVE NEGATIVE Final    Comment: (NOTE) The Xpert Xpress SARS-CoV-2/FLU/RSV plus assay is intended as an aid in the diagnosis of influenza from Nasopharyngeal swab specimens and should not be used as a sole basis for treatment. Nasal washings and aspirates are unacceptable for Xpert Xpress SARS-CoV-2/FLU/RSV testing.  Fact Sheet for Patients: EntrepreneurPulse.com.au  Fact Sheet for Healthcare Providers: IncredibleEmployment.be  This test is not yet approved or cleared by the [place] FDA and has been authorized for detection and/or diagnosis of SARS-CoV-2 by FDA under an Emergency Use Authorization (EUA). This EUA will remain in effect (meaning this test can be used) for the duration of the COVID-19 declaration under Section 564(b)(1) of the Act, 21 U.S.C. section 360bbb-3(b)(1), unless the authorization is terminated or revoked.  Performed at [facility], [place] [address] [place], [place] [address]          Radiology Studies: MR Brain W and Wo Contrast  Result Date: 02/22/2021 CLINICAL DATA:  Vision loss, monocular. EXAM: MRI HEAD AND ORBITS WITHOUT AND WITH CONTRAST TECHNIQUE: Multiplanar, multiecho pulse sequences of the brain and surrounding structures were obtained without and with intravenous contrast. Multiplanar, multiecho pulse sequences of the orbits and surrounding structures were obtained including fat saturation techniques, before and after intravenous contrast administration. CONTRAST:  47mL GADAVIST GADOBUTROL 1 MMOL/ML IV SOLN COMPARISON:  No pertinent prior exams available for comparison. FINDINGS: MRI HEAD FINDINGS Brain: Mild-to-moderate intermittent motion degradation, somewhat limiting evaluation. Cerebral volume is normal for age. There is multifocal T2/FLAIR hyperintensity within the  subcortical/juxtacortical and deep periventricular white matter. Many of the foci of signal abnormality are oriented perpendicular to the long axis of the lateral ventricles and findings are highly suggestive of a demyelinating process such as multiple sclerosis. A few foci of signal abnormality are also noted within the corpus callosum. Several lesions within the left centrum semiovale/corona radiata measuring up to 11 mm demonstrate corresponding restricted diffusion and subtle enhancement, and findings are compatible with active demyelination. Additionally, subtle corresponding restricted diffusion is questioned at sites of small lesions within the right centrum semiovale/corona radiata measuring up to 7 mm and active demyelination may also be present at these sites. No posterior fossa lesion is identified. No evidence of intracranial mass. No chronic intracranial blood products. No extra-axial fluid collection. No midline shift. Vascular: Expected proximal arterial flow voids. Skull and upper cervical spine: No focal marrow lesion.  MRI ORBITS FINDINGS The examination is intermittently motion degraded, limiting evaluation. Most notably, there is moderate/severe motion degradation of the coronal T2 weighted sequence and moderate motion degradation of the T1 weighted postcontrast sequences. Orbits: The globes are normal in size and contour. There is T2 hyperintense signal abnormality within portions of the intraorbital left optic nerve with associated abnormal enhancement (for instance as seen on series 20, image 17) (series 25, images 16-18). Findings are compatible with acute optic neuritis. Within the limitations of motion degradation, there is no appreciable signal abnormality or abnormal enhancement within the right optic nerve. The extraocular muscles and optic nerve sheath complexes are symmetric and unremarkable. Visualized sinuses: Paranasal sinus mucosal thickening at the imaged levels. Most notably,  there is moderate left ethmoid sinus mucosal thickening. Small right maxillary sinus mucous retention cyst. Soft tissues: No appreciable abnormality of the visualized maxillofacial or upper neck soft tissues. IMPRESSION: MRI brain: 1. Motion degraded exam as described. 2. Multifocal T2/FLAIR hyperintensity within the bilateral subcortical/juxtacortical and deep periventricular white matter, and also within the corpus callosum. These foci have an appearance highly suggestive of a demyelinating process such as multiple sclerosis. There is restricted diffusion and enhancement associated with several lesions within the left centrum semiovale/corona radiata measuring up to 11 mm, and findings are compatible with active demyelination. Active demyelination is also questioned at sites of smaller lesions within the right centrum semiovale/corona radiata measuring up to 7 mm. MRI orbits: 1. Motion degraded examination, as described and limiting evaluation. 2. Findings compatible with acute left optic neuritis, as described. 3. Within the limitations of motion degradation, no definite signal abnormality or abnormal enhancement of the right optic nerve. 4. Paranasal sinus disease as described. Electronically Signed   By: Kellie Simmering DO   On: 02/22/2021 19:28   MR CERVICAL SPINE W WO CONTRAST  Result Date: 02/23/2021 CLINICAL DATA:  Initial evaluation for demyelinating disease. EXAM: MRI CERVICAL AND THORACIC SPINE WITHOUT AND WITH CONTRAST TECHNIQUE: Multiplanar and multiecho pulse sequences of the cervical spine, to include the craniocervical junction and cervicothoracic junction, and the thoracic spine, were obtained without and with intravenous contrast. CONTRAST:  15mL GADAVIST GADOBUTROL 1 MMOL/ML IV SOLN COMPARISON:  Previous MRI from 02/22/2021. FINDINGS: MRI CERVICAL SPINE FINDINGS Alignment: Examination moderately to severely degraded by motion artifact. Straightening of the normal cervical lordosis.  No  listhesis. Vertebrae: Vertebral body height maintained without acute or chronic fracture. Bone marrow signal intensity somewhat diffusely decreased on T1 weighted imaging, nonspecific, but most commonly related to anemia, smoking, or obesity. No visible discrete or worrisome osseous lesions. No abnormal marrow edema or enhancement. Cord: No convincing or obvious cord signal changes to suggest demyelinating disease on this motion degraded exam. Overall cord caliber and morphology is normal. No appreciable abnormal enhancement. Posterior Fossa, vertebral arteries, paraspinal tissues: Craniocervical junction within normal limits. Paraspinous and prevertebral soft tissues normal. Normal flow voids seen within the vertebral arteries bilaterally. Disc levels: C2-C3: Unremarkable. C3-C4: Mild disc bulge with left greater than right uncovertebral hypertrophy. Flattening of the ventral thecal sac without significant spinal stenosis or cord deformity. Moderate left C4 foraminal narrowing. Right neural foramina remains patent. C4-C5: Mild disc bulge with uncovertebral hypertrophy. No spinal stenosis. Foramina remain patent. C5-C6: Mild disc bulge with uncovertebral hypertrophy. Mild indentation of the ventral thecal sac without significant spinal stenosis. Foramina remain patent. C6-C7: Mild disc bulge with uncovertebral hypertrophy. Broad base right paracentral disc osteophyte indents and partially effaces the ventral thecal sac. Minimal flattening of the  ventral cord with mild spinal stenosis. Foramina remain patent. C7-T1: Suspected mild facet hypertrophy. Normal interspace. No stenosis. MRI THORACIC SPINE FINDINGS Alignment:  Examination severely degraded by motion artifact. Vertebral bodies normally aligned with preservation of the normal thoracic kyphosis. No listhesis. Vertebrae: Probable endplate Schmorl's node deformities with mild height loss seen at the superior endplates of Q65 and L1. Associated minimal reactive  endplate edema at these levels. Vertebral body height otherwise maintained. Underlying bone marrow signal intensity within normal limits. No visible discrete or worrisome osseous lesion. No other abnormal marrow edema or enhancement. Cord: Signal intensity within the thoracic spinal cord is grossly within normal limits on this motion degraded exam. No obvious or convincing cord signal abnormality to suggest demyelinating disease. No visible abnormal enhancement. Paraspinal and other soft tissues: Paraspinous soft tissues grossly within normal limits. Disc levels: T11-12: Disc desiccation with diffuse disc bulge. Superimposed central disc protrusion indents the ventral thecal sac. Mild spinal stenosis with mild flattening of the ventral cord. Foramina remain patent. T12-L1: Disc bulge with disc desiccation. Superimposed central disc protrusion indents the ventral thecal sac, slightly eccentric to the right. Mild spinal stenosis with mild cord flattening. Foramina remain grossly patent. No other significant disc pathology seen within the thoracic spine. No other appreciable stenosis or impingement. IMPRESSION: 1. Technically limited exam due to extensive motion artifact. 2. Grossly normal MRI appearance of the cervical and thoracic spinal cord. No obvious cord signal abnormality to suggest demyelinating disease. No abnormal enhancement. 3. Mild multilevel cervical spondylosis with resultant mild spinal stenosis at C6-7. 4. Central disc protrusions at T11-12 and T12-L1 with resultant mild spinal stenosis. Electronically Signed   By: Jeannine Boga M.D.   On: 02/23/2021 23:42   MR THORACIC SPINE W WO CONTRAST  Result Date: 02/23/2021 CLINICAL DATA:  Initial evaluation for demyelinating disease. EXAM: MRI CERVICAL AND THORACIC SPINE WITHOUT AND WITH CONTRAST TECHNIQUE: Multiplanar and multiecho pulse sequences of the cervical spine, to include the craniocervical junction and cervicothoracic junction, and the  thoracic spine, were obtained without and with intravenous contrast. CONTRAST:  36mL GADAVIST GADOBUTROL 1 MMOL/ML IV SOLN COMPARISON:  Previous MRI from 02/22/2021. FINDINGS: MRI CERVICAL SPINE FINDINGS Alignment: Examination moderately to severely degraded by motion artifact. Straightening of the normal cervical lordosis.  No listhesis. Vertebrae: Vertebral body height maintained without acute or chronic fracture. Bone marrow signal intensity somewhat diffusely decreased on T1 weighted imaging, nonspecific, but most commonly related to anemia, smoking, or obesity. No visible discrete or worrisome osseous lesions. No abnormal marrow edema or enhancement. Cord: No convincing or obvious cord signal changes to suggest demyelinating disease on this motion degraded exam. Overall cord caliber and morphology is normal. No appreciable abnormal enhancement. Posterior Fossa, vertebral arteries, paraspinal tissues: Craniocervical junction within normal limits. Paraspinous and prevertebral soft tissues normal. Normal flow voids seen within the vertebral arteries bilaterally. Disc levels: C2-C3: Unremarkable. C3-C4: Mild disc bulge with left greater than right uncovertebral hypertrophy. Flattening of the ventral thecal sac without significant spinal stenosis or cord deformity. Moderate left C4 foraminal narrowing. Right neural foramina remains patent. C4-C5: Mild disc bulge with uncovertebral hypertrophy. No spinal stenosis. Foramina remain patent. C5-C6: Mild disc bulge with uncovertebral hypertrophy. Mild indentation of the ventral thecal sac without significant spinal stenosis. Foramina remain patent. C6-C7: Mild disc bulge with uncovertebral hypertrophy. Broad base right paracentral disc osteophyte indents and partially effaces the ventral thecal sac. Minimal flattening of the ventral cord with mild spinal stenosis. Foramina remain patent. C7-T1: Suspected mild facet  hypertrophy. Normal interspace. No stenosis. MRI THORACIC  SPINE FINDINGS Alignment:  Examination severely degraded by motion artifact. Vertebral bodies normally aligned with preservation of the normal thoracic kyphosis. No listhesis. Vertebrae: Probable endplate Schmorl's node deformities with mild height loss seen at the superior endplates of G18 and L1. Associated minimal reactive endplate edema at these levels. Vertebral body height otherwise maintained. Underlying bone marrow signal intensity within normal limits. No visible discrete or worrisome osseous lesion. No other abnormal marrow edema or enhancement. Cord: Signal intensity within the thoracic spinal cord is grossly within normal limits on this motion degraded exam. No obvious or convincing cord signal abnormality to suggest demyelinating disease. No visible abnormal enhancement. Paraspinal and other soft tissues: Paraspinous soft tissues grossly within normal limits. Disc levels: T11-12: Disc desiccation with diffuse disc bulge. Superimposed central disc protrusion indents the ventral thecal sac. Mild spinal stenosis with mild flattening of the ventral cord. Foramina remain patent. T12-L1: Disc bulge with disc desiccation. Superimposed central disc protrusion indents the ventral thecal sac, slightly eccentric to the right. Mild spinal stenosis with mild cord flattening. Foramina remain grossly patent. No other significant disc pathology seen within the thoracic spine. No other appreciable stenosis or impingement. IMPRESSION: 1. Technically limited exam due to extensive motion artifact. 2. Grossly normal MRI appearance of the cervical and thoracic spinal cord. No obvious cord signal abnormality to suggest demyelinating disease. No abnormal enhancement. 3. Mild multilevel cervical spondylosis with resultant mild spinal stenosis at C6-7. 4. Central disc protrusions at T11-12 and T12-L1 with resultant mild spinal stenosis. Electronically Signed   By: Jeannine Boga M.D.   On: 02/23/2021 23:42   DG Chest  Portable 1 View  Result Date: 02/22/2021 CLINICAL DATA:  Loss of vision left eye EXAM: PORTABLE CHEST 1 VIEW COMPARISON:  October 18, 2016 FINDINGS: Lungs are clear. Heart size and pulmonary vascularity are normal. No adenopathy. No bone lesions. IMPRESSION: Lungs clear.  Heart size normal. Electronically Signed   By: Lowella Grip III M.D.   On: 02/22/2021 21:12   MR ORBITS W WO CONTRAST  Result Date: 02/22/2021 CLINICAL DATA:  Vision loss, monocular. EXAM: MRI HEAD AND ORBITS WITHOUT AND WITH CONTRAST TECHNIQUE: Multiplanar, multiecho pulse sequences of the brain and surrounding structures were obtained without and with intravenous contrast. Multiplanar, multiecho pulse sequences of the orbits and surrounding structures were obtained including fat saturation techniques, before and after intravenous contrast administration. CONTRAST:  46mL GADAVIST GADOBUTROL 1 MMOL/ML IV SOLN COMPARISON:  No pertinent prior exams available for comparison. FINDINGS: MRI HEAD FINDINGS Brain: Mild-to-moderate intermittent motion degradation, somewhat limiting evaluation. Cerebral volume is normal for age. There is multifocal T2/FLAIR hyperintensity within the subcortical/juxtacortical and deep periventricular white matter. Many of the foci of signal abnormality are oriented perpendicular to the long axis of the lateral ventricles and findings are highly suggestive of a demyelinating process such as multiple sclerosis. A few foci of signal abnormality are also noted within the corpus callosum. Several lesions within the left centrum semiovale/corona radiata measuring up to 11 mm demonstrate corresponding restricted diffusion and subtle enhancement, and findings are compatible with active demyelination. Additionally, subtle corresponding restricted diffusion is questioned at sites of small lesions within the right centrum semiovale/corona radiata measuring up to 7 mm and active demyelination may also be present at these  sites. No posterior fossa lesion is identified. No evidence of intracranial mass. No chronic intracranial blood products. No extra-axial fluid collection. No midline shift. Vascular: Expected proximal arterial flow voids. Skull and  upper cervical spine: No focal marrow lesion. MRI ORBITS FINDINGS The examination is intermittently motion degraded, limiting evaluation. Most notably, there is moderate/severe motion degradation of the coronal T2 weighted sequence and moderate motion degradation of the T1 weighted postcontrast sequences. Orbits: The globes are normal in size and contour. There is T2 hyperintense signal abnormality within portions of the intraorbital left optic nerve with associated abnormal enhancement (for instance as seen on series 20, image 17) (series 25, images 16-18). Findings are compatible with acute optic neuritis. Within the limitations of motion degradation, there is no appreciable signal abnormality or abnormal enhancement within the right optic nerve. The extraocular muscles and optic nerve sheath complexes are symmetric and unremarkable. Visualized sinuses: Paranasal sinus mucosal thickening at the imaged levels. Most notably, there is moderate left ethmoid sinus mucosal thickening. Small right maxillary sinus mucous retention cyst. Soft tissues: No appreciable abnormality of the visualized maxillofacial or upper neck soft tissues. IMPRESSION: MRI brain: 1. Motion degraded exam as described. 2. Multifocal T2/FLAIR hyperintensity within the bilateral subcortical/juxtacortical and deep periventricular white matter, and also within the corpus callosum. These foci have an appearance highly suggestive of a demyelinating process such as multiple sclerosis. There is restricted diffusion and enhancement associated with several lesions within the left centrum semiovale/corona radiata measuring up to 11 mm, and findings are compatible with active demyelination. Active demyelination is also questioned  at sites of smaller lesions within the right centrum semiovale/corona radiata measuring up to 7 mm. MRI orbits: 1. Motion degraded examination, as described and limiting evaluation. 2. Findings compatible with acute left optic neuritis, as described. 3. Within the limitations of motion degradation, no definite signal abnormality or abnormal enhancement of the right optic nerve. 4. Paranasal sinus disease as described. Electronically Signed   By: Kellie Simmering DO   On: 02/22/2021 19:28    Scheduled Meds: . multivitamin with minerals  1 tablet Oral Daily  . nicotine  7 mg Transdermal Daily  . pantoprazole  40 mg Oral Q0600  . sodium chloride flush  3 mL Intravenous Q12H   Continuous Infusions: . sodium chloride    . methylPREDNISolone (SOLU-MEDROL) injection 1,000 mg (02/23/21 1031)     LOS: 2 days   Time spent: 43 min  [place], DO [facility]  If 7PM-7AM, please contact night-coverage [email or web address]  02/24/2021, 7:56 AM

## 2021-02-24 NOTE — Progress Notes (Signed)
Imaging reviewed. No spinal demyelinating lesions in the C- or t-spine. Continue to complete 5 days of IV steroids as advised.  -- Amie Portland, MD Neurologist

## 2021-02-25 DIAGNOSIS — H469 Unspecified optic neuritis: Secondary | ICD-10-CM | POA: Diagnosis not present

## 2021-02-25 LAB — COMPREHENSIVE METABOLIC PANEL
ALT: 12 U/L (ref 0–44)
AST: 12 U/L — ABNORMAL LOW (ref 15–41)
Albumin: 3.1 g/dL — ABNORMAL LOW (ref 3.5–5.0)
Alkaline Phosphatase: 43 U/L (ref 38–126)
Anion gap: 5 (ref 5–15)
BUN: 11 mg/dL (ref 6–20)
CO2: 24 mmol/L (ref 22–32)
Calcium: 8.2 mg/dL — ABNORMAL LOW (ref 8.9–10.3)
Chloride: 109 mmol/L (ref 98–111)
Creatinine, Ser: 0.6 mg/dL (ref 0.44–1.00)
GFR, Estimated: >60 mL/min (ref >60)
Glucose, Bld: 129 mg/dL — ABNORMAL HIGH (ref 70–99)
Potassium: 3.2 mmol/L — ABNORMAL LOW (ref 3.5–5.1)
Sodium: 138 mmol/L (ref 135–145)
Total Bilirubin: 0.6 mg/dL (ref 0.3–1.2)
Total Protein: 6 g/dL — ABNORMAL LOW (ref 6.5–8.1)

## 2021-02-25 LAB — CBC
HCT: 30.7 % — ABNORMAL LOW (ref 36.0–46.0)
Hemoglobin: 9.3 g/dL — ABNORMAL LOW (ref 12.0–15.0)
MCH: 25.1 pg — ABNORMAL LOW (ref 26.0–34.0)
MCHC: 30.3 g/dL (ref 30.0–36.0)
MCV: 83 fL (ref 80.0–100.0)
Platelets: 197 10*3/uL (ref 150–400)
RBC: 3.7 MIL/uL — ABNORMAL LOW (ref 3.87–5.11)
RDW: 17.2 % — ABNORMAL HIGH (ref 11.5–15.5)
WBC: 8 10*3/uL (ref 4.0–10.5)
nRBC: 0 % (ref 0.0–0.2)

## 2021-02-25 MED ORDER — OXYCODONE HCL 5 MG PO TABS
5.0000 mg | ORAL_TABLET | Freq: Four times a day (QID) | ORAL | Status: AC | PRN
  Administered 2021-02-25 (×2): 5 mg via ORAL
  Filled 2021-02-25 (×2): qty 1

## 2021-02-25 MED ORDER — DIPHENHYDRAMINE HCL 25 MG PO CAPS
25.0000 mg | ORAL_CAPSULE | Freq: Once | ORAL | Status: AC
  Administered 2021-02-25: 25 mg via ORAL
  Filled 2021-02-25: qty 1

## 2021-02-25 MED ORDER — OXYCODONE HCL 5 MG PO TABS
5.0000 mg | ORAL_TABLET | Freq: Four times a day (QID) | ORAL | Status: AC | PRN
Start: 2021-02-25 — End: 2021-02-26
  Administered 2021-02-25 – 2021-02-26 (×2): 5 mg via ORAL
  Filled 2021-02-25 (×2): qty 1

## 2021-02-25 NOTE — Progress Notes (Signed)
PROGRESS NOTE    Lynn Roberts  CNO:[id number] DOB: [birth year] DOA: 02/22/2021 PCP: Patient, No Pcp Per   Brief Narrative:  Lynn Roberts is a 35 y.o. female with medical history significant for  paroxysmal atrial tachycardia, iron deficiency anemia, presenting with complaints of vision loss. Patient report approximately a week ago she developed some blurry vision in her left eye. She reports developing pain behind her left eye a few days ago. She reports having a dull headache on left and her vision progressed from being blurry in left eye to complete vision loss in left eye today prompting her to go to optomitrist. States that she sees nothing but black in her left eye. Headache since resolved however visual change persist. No associated double vision, no focal numbness or focal weakness. She initially thought it may be due to sinus pressure and allergies. She denies any prior visual impairment. She does not wear contact lenses or prescription glasses. She went to see an eye specialist today at [facility] and was evaluated by ophthalmologist, Dr. Tama High.States that she had an eye exam and was recommended to come to the ER for further work-up which include brain orbit MRI for further assessment. Patient otherwise denies any recent injury and denies any history of prior stroke. She is a smoker and smoked half a pack of cigarettes a day. In ED: MRI revealed the old demyelinating lesions in the brain and optic neuritis consistent with multiple sclerosis.  Remainder of work-up was otherwise unremarkable.  Patient was started on high-dose Solu-Medrol the emergency room and neurology recommended admission to medicine service.   Assessment & Plan:   Principal Problem:   Optic neuritis Active Problems:   Demyelinating changes in brain (HCC)   Hypokalemia  Acute optic neuritis, POA - Neurology consulted - started on high dose steroid with Solu-Medrol 1 g IV daily  for 5 days per recommendations - to finish 02/26/21 - Follow visual field changes/improvement qshift -improving moderately over the past 48 hours  Demyelinating changes in brain, unspecified - Patient with demyelinating lesions on MRI of brain concerning for multiple sclerosis.  - MRI of the T and L-spine grossly unremarkable, some motion degradation noted but without obvious cord signal to suggest demyelinating disease. - Appreciate neurology evaluation and recommendations  Hypokalemia, resolved Follow morning labs, currently well controlled  Dermatitis, unspecified Questionably in the setting of malnutrition secondary to gastric bypass surgery some 8 yrs ago  Recommended to initiate multivitamin  Cannot rule out contact dermatitis from frequent hand sanitizer use and frequent washing of hands.  Continue to recommend use of daily moisturizer  DVT prophylaxis: SCDs/early ambulation Code Status: Full Family Communication: At bedside  Status is: Inpatient  Dispo: The patient is from: Home              Anticipated d/c is to: Home              Anticipated d/c date is: 72 to 96 hours pending clinical course and completion of steroids              Patient currently not medically stable for discharge  Consultants:   Neurology  Procedures:   None  Antimicrobials:  None  Subjective: No acute issues or events overnight, steroids ongoing per neurology, vision marginally improved overnight.  Objective: Vitals:   02/24/21 0514 02/24/21 1343 02/24/21 2305 02/25/21 0549  BP: (!) 102/59 (!) 103/50 (!) 143/75 129/72  Pulse: 87 63 64 (!) 48  Resp: 17  16 16 20   Temp: 98.1 F (36.7 C) (!) 97.4 F (36.3 C) 98.2 F (36.8 C) 97.9 F (36.6 C)  TempSrc: Oral Oral    SpO2: 95% 98% 97% 96%  Weight:      Height:        Intake/Output Summary (Last 24 hours) at 02/25/2021 0810 Last data filed at 02/24/2021 2335 Gross per 24 hour  Intake 600 ml  Output -  Net 600 ml   Filed Weights    02/22/21 1603  Weight: 117.9 kg    Examination:  General exam: Appears calm and comfortable  Respiratory system: Clear to auscultation. Respiratory effort normal. Cardiovascular system: S1 & S2 heard, RRR. No JVD, murmurs, rubs, gallops or clicks. No pedal edema. Gastrointestinal system: Abdomen is nondistended, soft and nontender. No organomegaly or masses felt. Normal bowel sounds heard. Central nervous system: Alert and oriented. No focal neurological deficits. Extremities: Symmetric 5 x 5 power. Skin: No rashes, lesions or ulcers Psychiatry: Judgement and insight appear normal. Mood & affect appropriate.   Data Reviewed: I have personally reviewed following labs and imaging studies  CBC: Recent Labs  Lab 02/22/21 1615 02/22/21 1622 02/23/21 0450 02/24/21 0347  WBC 6.3  --  5.6 8.7  NEUTROABS 3.9  --   --   --   HGB 11.0* 12.2 10.4* 9.8*  HCT 35.8* 36.0 33.7* 32.1*  MCV 82.7  --  82.2 83.2  PLT 272  --  242 017   Basic Metabolic Panel: Recent Labs  Lab 02/22/21 1615 02/22/21 1622 02/23/21 0450 02/24/21 0347  NA  --  145 139 138  K  --  3.3* 3.9 3.7  CL  --  106 110 107  CO2  --   --  22 24  GLUCOSE  --  86 140* 125*  BUN  --  8 14 13   CREATININE  --  0.70 0.57 0.55  CALCIUM  --   --  8.7* 8.6*  MG 2.0  --   --   --    GFR: Estimated Creatinine Clearance: 137.5 mL/min (by C-G formula based on SCr of 0.55 mg/dL). Liver Function Tests: Recent Labs  Lab 02/24/21 0347  AST 12*  ALT 11  ALKPHOS 46  BILITOT 0.6  PROT 6.1*  ALBUMIN 3.2*   No results for input(s): LIPASE, AMYLASE in the last 168 hours. No results for input(s): AMMONIA in the last 168 hours. Coagulation Profile: No results for input(s): INR, PROTIME in the last 168 hours. Cardiac Enzymes: No results for input(s): CKTOTAL, CKMB, CKMBINDEX, TROPONINI in the last 168 hours. BNP (last 3 results) No results for input(s): PROBNP in the last 8760 hours. HbA1C: No results for input(s): HGBA1C  in the last 72 hours. CBG: No results for input(s): GLUCAP in the last 168 hours. Lipid Profile: No results for input(s): CHOL, HDL, LDLCALC, TRIG, CHOLHDL, LDLDIRECT in the last 72 hours. Thyroid Function Tests: No results for input(s): TSH, T4TOTAL, FREET4, T3FREE, THYROIDAB in the last 72 hours. Anemia Panel: Recent Labs    02/23/21 0450  VITAMINB12 152*   Sepsis Labs: No results for input(s): PROCALCITON, LATICACIDVEN in the last 168 hours.  Recent Results (from the past 240 hour(s))  Resp Panel by RT-PCR (Flu A&B, Covid) Nasopharyngeal Swab     Status: Abnormal   Collection Time: 02/22/21 11:08 PM   Specimen: Nasopharyngeal Swab; Nasopharyngeal(NP) swabs in vial transport medium  Result Value Ref Range Status   SARS Coronavirus 2 by RT PCR POSITIVE (A)  NEGATIVE Final    Comment: RESULT CALLED TO, READ BACK BY AND VERIFIED WITH: CAITLYN, RN @ [phone] ON 02/23/21 C VARNER (NOTE) SARS-CoV-2 target nucleic acids are DETECTED.  The SARS-CoV-2 RNA is generally detectable in upper respiratory specimens during the acute phase of infection. Positive results are indicative of the presence of the identified virus, but do not rule out bacterial infection or co-infection with other pathogens not detected by the test. Clinical correlation with patient history and other diagnostic information is necessary to determine patient infection status. The expected result is Negative.  Fact Sheet for Patients: EntrepreneurPulse.com.au  Fact Sheet for Healthcare Providers: IncredibleEmployment.be  This test is not yet approved or cleared by the [place] FDA and  has been authorized for detection and/or diagnosis of SARS-CoV-2 by FDA under an Emergency Use Authorization (EUA).  This EUA will remain in effect (meaning this test c an be used) for the duration of  the COVID-19 declaration under Section 564(b)(1) of the Act, 21 U.S.C. section 360bbb-3(b)(1),  unless the authorization is terminated or revoked sooner.     Influenza A by PCR NEGATIVE NEGATIVE Final   Influenza B by PCR NEGATIVE NEGATIVE Final    Comment: (NOTE) The Xpert Xpress SARS-CoV-2/FLU/RSV plus assay is intended as an aid in the diagnosis of influenza from Nasopharyngeal swab specimens and should not be used as a sole basis for treatment. Nasal washings and aspirates are unacceptable for Xpert Xpress SARS-CoV-2/FLU/RSV testing.  Fact Sheet for Patients: EntrepreneurPulse.com.au  Fact Sheet for Healthcare Providers: IncredibleEmployment.be  This test is not yet approved or cleared by the [place] FDA and has been authorized for detection and/or diagnosis of SARS-CoV-2 by FDA under an Emergency Use Authorization (EUA). This EUA will remain in effect (meaning this test can be used) for the duration of the COVID-19 declaration under Section 564(b)(1) of the Act, 21 U.S.C. section 360bbb-3(b)(1), unless the authorization is terminated or revoked.  Performed at [facility], [place] [address], [place], [place] [address]          Radiology Studies: MR CERVICAL SPINE W WO CONTRAST  Result Date: 02/23/2021 CLINICAL DATA:  Initial evaluation for demyelinating disease. EXAM: MRI CERVICAL AND THORACIC SPINE WITHOUT AND WITH CONTRAST TECHNIQUE: Multiplanar and multiecho pulse sequences of the cervical spine, to include the craniocervical junction and cervicothoracic junction, and the thoracic spine, were obtained without and with intravenous contrast. CONTRAST:  65mL GADAVIST GADOBUTROL 1 MMOL/ML IV SOLN COMPARISON:  Previous MRI from 02/22/2021. FINDINGS: MRI CERVICAL SPINE FINDINGS Alignment: Examination moderately to severely degraded by motion artifact. Straightening of the normal cervical lordosis.  No listhesis. Vertebrae: Vertebral body height maintained without acute or chronic fracture. Bone marrow signal  intensity somewhat diffusely decreased on T1 weighted imaging, nonspecific, but most commonly related to anemia, smoking, or obesity. No visible discrete or worrisome osseous lesions. No abnormal marrow edema or enhancement. Cord: No convincing or obvious cord signal changes to suggest demyelinating disease on this motion degraded exam. Overall cord caliber and morphology is normal. No appreciable abnormal enhancement. Posterior Fossa, vertebral arteries, paraspinal tissues: Craniocervical junction within normal limits. Paraspinous and prevertebral soft tissues normal. Normal flow voids seen within the vertebral arteries bilaterally. Disc levels: C2-C3: Unremarkable. C3-C4: Mild disc bulge with left greater than right uncovertebral hypertrophy. Flattening of the ventral thecal sac without significant spinal stenosis or cord deformity. Moderate left C4 foraminal narrowing. Right neural foramina remains patent. C4-C5: Mild disc bulge with uncovertebral hypertrophy. No spinal stenosis. Foramina remain  patent. C5-C6: Mild disc bulge with uncovertebral hypertrophy. Mild indentation of the ventral thecal sac without significant spinal stenosis. Foramina remain patent. C6-C7: Mild disc bulge with uncovertebral hypertrophy. Broad base right paracentral disc osteophyte indents and partially effaces the ventral thecal sac. Minimal flattening of the ventral cord with mild spinal stenosis. Foramina remain patent. C7-T1: Suspected mild facet hypertrophy. Normal interspace. No stenosis. MRI THORACIC SPINE FINDINGS Alignment:  Examination severely degraded by motion artifact. Vertebral bodies normally aligned with preservation of the normal thoracic kyphosis. No listhesis. Vertebrae: Probable endplate Schmorl's node deformities with mild height loss seen at the superior endplates of N62 and L1. Associated minimal reactive endplate edema at these levels. Vertebral body height otherwise maintained. Underlying bone marrow signal  intensity within normal limits. No visible discrete or worrisome osseous lesion. No other abnormal marrow edema or enhancement. Cord: Signal intensity within the thoracic spinal cord is grossly within normal limits on this motion degraded exam. No obvious or convincing cord signal abnormality to suggest demyelinating disease. No visible abnormal enhancement. Paraspinal and other soft tissues: Paraspinous soft tissues grossly within normal limits. Disc levels: T11-12: Disc desiccation with diffuse disc bulge. Superimposed central disc protrusion indents the ventral thecal sac. Mild spinal stenosis with mild flattening of the ventral cord. Foramina remain patent. T12-L1: Disc bulge with disc desiccation. Superimposed central disc protrusion indents the ventral thecal sac, slightly eccentric to the right. Mild spinal stenosis with mild cord flattening. Foramina remain grossly patent. No other significant disc pathology seen within the thoracic spine. No other appreciable stenosis or impingement. IMPRESSION: 1. Technically limited exam due to extensive motion artifact. 2. Grossly normal MRI appearance of the cervical and thoracic spinal cord. No obvious cord signal abnormality to suggest demyelinating disease. No abnormal enhancement. 3. Mild multilevel cervical spondylosis with resultant mild spinal stenosis at C6-7. 4. Central disc protrusions at T11-12 and T12-L1 with resultant mild spinal stenosis. Electronically Signed   By: Jeannine Boga M.D.   On: 02/23/2021 23:42   MR THORACIC SPINE W WO CONTRAST  Result Date: 02/23/2021 CLINICAL DATA:  Initial evaluation for demyelinating disease. EXAM: MRI CERVICAL AND THORACIC SPINE WITHOUT AND WITH CONTRAST TECHNIQUE: Multiplanar and multiecho pulse sequences of the cervical spine, to include the craniocervical junction and cervicothoracic junction, and the thoracic spine, were obtained without and with intravenous contrast. CONTRAST:  58mL GADAVIST GADOBUTROL 1  MMOL/ML IV SOLN COMPARISON:  Previous MRI from 02/22/2021. FINDINGS: MRI CERVICAL SPINE FINDINGS Alignment: Examination moderately to severely degraded by motion artifact. Straightening of the normal cervical lordosis.  No listhesis. Vertebrae: Vertebral body height maintained without acute or chronic fracture. Bone marrow signal intensity somewhat diffusely decreased on T1 weighted imaging, nonspecific, but most commonly related to anemia, smoking, or obesity. No visible discrete or worrisome osseous lesions. No abnormal marrow edema or enhancement. Cord: No convincing or obvious cord signal changes to suggest demyelinating disease on this motion degraded exam. Overall cord caliber and morphology is normal. No appreciable abnormal enhancement. Posterior Fossa, vertebral arteries, paraspinal tissues: Craniocervical junction within normal limits. Paraspinous and prevertebral soft tissues normal. Normal flow voids seen within the vertebral arteries bilaterally. Disc levels: C2-C3: Unremarkable. C3-C4: Mild disc bulge with left greater than right uncovertebral hypertrophy. Flattening of the ventral thecal sac without significant spinal stenosis or cord deformity. Moderate left C4 foraminal narrowing. Right neural foramina remains patent. C4-C5: Mild disc bulge with uncovertebral hypertrophy. No spinal stenosis. Foramina remain patent. C5-C6: Mild disc bulge with uncovertebral hypertrophy. Mild indentation of the ventral  thecal sac without significant spinal stenosis. Foramina remain patent. C6-C7: Mild disc bulge with uncovertebral hypertrophy. Broad base right paracentral disc osteophyte indents and partially effaces the ventral thecal sac. Minimal flattening of the ventral cord with mild spinal stenosis. Foramina remain patent. C7-T1: Suspected mild facet hypertrophy. Normal interspace. No stenosis. MRI THORACIC SPINE FINDINGS Alignment:  Examination severely degraded by motion artifact. Vertebral bodies normally  aligned with preservation of the normal thoracic kyphosis. No listhesis. Vertebrae: Probable endplate Schmorl's node deformities with mild height loss seen at the superior endplates of G92 and L1. Associated minimal reactive endplate edema at these levels. Vertebral body height otherwise maintained. Underlying bone marrow signal intensity within normal limits. No visible discrete or worrisome osseous lesion. No other abnormal marrow edema or enhancement. Cord: Signal intensity within the thoracic spinal cord is grossly within normal limits on this motion degraded exam. No obvious or convincing cord signal abnormality to suggest demyelinating disease. No visible abnormal enhancement. Paraspinal and other soft tissues: Paraspinous soft tissues grossly within normal limits. Disc levels: T11-12: Disc desiccation with diffuse disc bulge. Superimposed central disc protrusion indents the ventral thecal sac. Mild spinal stenosis with mild flattening of the ventral cord. Foramina remain patent. T12-L1: Disc bulge with disc desiccation. Superimposed central disc protrusion indents the ventral thecal sac, slightly eccentric to the right. Mild spinal stenosis with mild cord flattening. Foramina remain grossly patent. No other significant disc pathology seen within the thoracic spine. No other appreciable stenosis or impingement. IMPRESSION: 1. Technically limited exam due to extensive motion artifact. 2. Grossly normal MRI appearance of the cervical and thoracic spinal cord. No obvious cord signal abnormality to suggest demyelinating disease. No abnormal enhancement. 3. Mild multilevel cervical spondylosis with resultant mild spinal stenosis at C6-7. 4. Central disc protrusions at T11-12 and T12-L1 with resultant mild spinal stenosis. Electronically Signed   By: Jeannine Boga M.D.   On: 02/23/2021 23:42    Scheduled Meds: . multivitamin with minerals  1 tablet Oral Daily  . nicotine  7 mg Transdermal Daily  .  pantoprazole  40 mg Oral Q0600  . sodium chloride flush  3 mL Intravenous Q12H   Continuous Infusions: . sodium chloride    . methylPREDNISolone (SOLU-MEDROL) injection 1,000 mg (02/24/21 1011)     LOS: 3 days   Time spent: [place], DO [facility]  If 7PM-7AM, please contact night-coverage [email or web address]  02/25/2021, 8:10 AM

## 2021-02-26 DIAGNOSIS — H469 Unspecified optic neuritis: Secondary | ICD-10-CM | POA: Diagnosis not present

## 2021-02-26 LAB — COMPREHENSIVE METABOLIC PANEL
ALT: 23 U/L (ref 0–44)
AST: 31 U/L (ref 15–41)
Albumin: 2.9 g/dL — ABNORMAL LOW (ref 3.5–5.0)
Alkaline Phosphatase: 42 U/L (ref 38–126)
Anion gap: 7 (ref 5–15)
BUN: 9 mg/dL (ref 6–20)
CO2: 23 mmol/L (ref 22–32)
Calcium: 8 mg/dL — ABNORMAL LOW (ref 8.9–10.3)
Chloride: 108 mmol/L (ref 98–111)
Creatinine, Ser: 0.52 mg/dL (ref 0.44–1.00)
GFR, Estimated: >60 mL/min (ref >60)
Glucose, Bld: 142 mg/dL — ABNORMAL HIGH (ref 70–99)
Potassium: 3.4 mmol/L — ABNORMAL LOW (ref 3.5–5.1)
Sodium: 138 mmol/L (ref 135–145)
Total Bilirubin: 0.6 mg/dL (ref 0.3–1.2)
Total Protein: 5.7 g/dL — ABNORMAL LOW (ref 6.5–8.1)

## 2021-02-26 LAB — CBC
HCT: 31 % — ABNORMAL LOW (ref 36.0–46.0)
Hemoglobin: 9.4 g/dL — ABNORMAL LOW (ref 12.0–15.0)
MCH: 24.9 pg — ABNORMAL LOW (ref 26.0–34.0)
MCHC: 30.3 g/dL (ref 30.0–36.0)
MCV: 82.2 fL (ref 80.0–100.0)
Platelets: 179 10*3/uL (ref 150–400)
RBC: 3.77 MIL/uL — ABNORMAL LOW (ref 3.87–5.11)
RDW: 17.1 % — ABNORMAL HIGH (ref 11.5–15.5)
WBC: 5.4 10*3/uL (ref 4.0–10.5)
nRBC: 0 % (ref 0.0–0.2)

## 2021-02-26 LAB — MISC LABCORP TEST (SEND OUT): Labcorp test code: 505310

## 2021-02-26 LAB — NEUROMYELITIS OPTICA AUTOAB, IGG: NMO-IgG: <1.5 U/mL (ref 0.0–3.0)

## 2021-02-26 MED ORDER — ONDANSETRON HCL 4 MG PO TABS: 4.0000 mg | ORAL_TABLET | Freq: Every day | ORAL | 1 refills | Status: DC | PRN

## 2021-02-26 MED ORDER — NICOTINE 7 MG/24HR TD PT24: 7.0000 mg | MEDICATED_PATCH | Freq: Every day | TRANSDERMAL | 0 refills | Status: DC

## 2021-02-26 MED ORDER — ADULT MULTIVITAMIN W/MINERALS CH: 1.0000 | ORAL_TABLET | Freq: Every day | ORAL | 0 refills | Status: AC

## 2021-02-26 MED ORDER — NICOTINE 21 MG/24HR TD PT24: 21.0000 mg | MEDICATED_PATCH | Freq: Every day | TRANSDERMAL | 0 refills | Status: AC

## 2021-02-26 NOTE — Discharge Summary (Signed)
Physician Discharge Summary  Lynn Roberts CWU:[id number] DOB: 08-01-1986 DOA: 02/22/2021  PCP: Patient, No Pcp Per  Admit date: 02/22/2021 Discharge date: 02/26/2021  Admitted From: Home Disposition: Home  Recommendations for Outpatient Follow-up:  1. Follow up with PCP in 1-2 weeks 2. Please obtain BMP/CBC in one week  Home Health: None Equipment/Devices: None  Discharge Condition: Stable CODE STATUS: Full Diet recommendation: Low-salt low-fat diet  Brief/Interim Summary: Lynn Roberts a 35 y.o.femalewith medical history significant forparoxysmal atrial tachycardia, iron deficiency anemia, presenting with complaints of vision loss. Patient report approximately a week ago she developed someblurry vision in her left eye. She reports developing pain behind her left eye a few days ago. She reports having a dull headache on left and her vision progressed from being blurry in left eye to complete vision loss in left eye today prompting her to go to optomitrist. States that she sees nothing but black in her left eye. Headache since resolved however visual change persist. No associated double vision,nofocal numbness or focal weakness. She initially thought it may be due to sinus pressure and allergies. She denies any prior visual impairment. She does not wear contact lenses or prescription glasses. She went to see an eye specialist today at [facility] and was evaluated by ophthalmologist, Dr. Tama High.States that she had an eye exam and was recommended to come to the ER for further work-up which include brain orbit MRI for further assessment. Patient otherwise denies any recent injury and denies any history of prior stroke. She is a smoker and smoked half a pack of cigarettes a day. In ED: MRI revealed the old demyelinating lesions in the brain and optic neuritis consistent with multiple sclerosis. Remainder of work-up was otherwise unremarkable. Patient  was started on high-dose Solu-Medrol the emergency room and neurology recommended admission to medicine service.  Patient admitted as above with acute optic neuritis concerning for demyelination disease given her age sex and comorbidities.  Fortunately patient's MRI of T and C-spine was unremarkable for any further lesions indicating that she is unlikely to have MS or this was her first MS flare.  Neurology recommended 5 days of IV steroids which patient is now completed, vision is markedly improved although not back to baseline.  She is otherwise stable and agreeable for discharge home, we had a lengthy discussion about need for tobacco cessation, nicotine patch prescribed at discharge.  Patient will need to obtain a new PCP as she does not have one, also planned for evaluation and follow-up with neurology in the outpatient setting for further discussion about possible etiology of her optic neuritis as well as for repeat imaging as indicated and repeat labs to help further evaluate etiology of vision changes.  Patient otherwise stable and agreeable for discharge home.  Discharge Diagnoses:  Principal Problem:   Optic neuritis Active Problems:   Demyelinating changes in brain [facility])   Hypokalemia    Discharge Instructions  Discharge Instructions    Call MD for:  difficulty breathing, headache or visual disturbances   Complete by: As directed    Call MD for:  severe uncontrolled pain   Complete by: As directed    Diet - low sodium heart healthy   Complete by: As directed    Increase activity slowly   Complete by: As directed      Allergies as of 02/26/2021      Reactions   Nsaids Other (See Comments)   HX. OF WEIGHT-LOSS SURGERY   Other Rash  Reaction to LOTIONS    Sulfa Antibiotics Rash   Tramadol Rash      Medication List    TAKE these medications   acetaminophen 325 MG tablet Commonly known as: TYLENOL Take 650 mg by mouth every 6 (six) hours as needed for moderate pain or  headache.   multivitamin with minerals Tabs tablet Take 1 tablet by mouth daily.   nicotine 7 mg/24hr patch Commonly known as: NICODERM CQ - dosed in mg/24 hr Place 1 patch (7 mg total) onto the skin daily.       Allergies  Allergen Reactions  . Nsaids Other (See Comments)    HX. OF WEIGHT-LOSS SURGERY   . Other Rash    Reaction to LOTIONS   . Sulfa Antibiotics Rash  . Tramadol Rash    Consultations: Neurology  Procedures/Studies: MR Brain W and Wo Contrast  Result Date: 02/22/2021 CLINICAL DATA:  Vision loss, monocular. EXAM: MRI HEAD AND ORBITS WITHOUT AND WITH CONTRAST TECHNIQUE: Multiplanar, multiecho pulse sequences of the brain and surrounding structures were obtained without and with intravenous contrast. Multiplanar, multiecho pulse sequences of the orbits and surrounding structures were obtained including fat saturation techniques, before and after intravenous contrast administration. CONTRAST:  29mL GADAVIST GADOBUTROL 1 MMOL/ML IV SOLN COMPARISON:  No pertinent prior exams available for comparison. FINDINGS: MRI HEAD FINDINGS Brain: Mild-to-moderate intermittent motion degradation, somewhat limiting evaluation. Cerebral volume is normal for age. There is multifocal T2/FLAIR hyperintensity within the subcortical/juxtacortical and deep periventricular white matter. Many of the foci of signal abnormality are oriented perpendicular to the long axis of the lateral ventricles and findings are highly suggestive of a demyelinating process such as multiple sclerosis. A few foci of signal abnormality are also noted within the corpus callosum. Several lesions within the left centrum semiovale/corona radiata measuring up to 11 mm demonstrate corresponding restricted diffusion and subtle enhancement, and findings are compatible with active demyelination. Additionally, subtle corresponding restricted diffusion is questioned at sites of small lesions within the right centrum semiovale/corona  radiata measuring up to 7 mm and active demyelination may also be present at these sites. No posterior fossa lesion is identified. No evidence of intracranial mass. No chronic intracranial blood products. No extra-axial fluid collection. No midline shift. Vascular: Expected proximal arterial flow voids. Skull and upper cervical spine: No focal marrow lesion. MRI ORBITS FINDINGS The examination is intermittently motion degraded, limiting evaluation. Most notably, there is moderate/severe motion degradation of the coronal T2 weighted sequence and moderate motion degradation of the T1 weighted postcontrast sequences. Orbits: The globes are normal in size and contour. There is T2 hyperintense signal abnormality within portions of the intraorbital left optic nerve with associated abnormal enhancement (for instance as seen on series 20, image 17) (series 25, images 16-18). Findings are compatible with acute optic neuritis. Within the limitations of motion degradation, there is no appreciable signal abnormality or abnormal enhancement within the right optic nerve. The extraocular muscles and optic nerve sheath complexes are symmetric and unremarkable. Visualized sinuses: Paranasal sinus mucosal thickening at the imaged levels. Most notably, there is moderate left ethmoid sinus mucosal thickening. Small right maxillary sinus mucous retention cyst. Soft tissues: No appreciable abnormality of the visualized maxillofacial or upper neck soft tissues. IMPRESSION: MRI brain: 1. Motion degraded exam as described. 2. Multifocal T2/FLAIR hyperintensity within the bilateral subcortical/juxtacortical and deep periventricular white matter, and also within the corpus callosum. These foci have an appearance highly suggestive of a demyelinating process such as multiple sclerosis. There is restricted  diffusion and enhancement associated with several lesions within the left centrum semiovale/corona radiata measuring up to 11 mm, and  findings are compatible with active demyelination. Active demyelination is also questioned at sites of smaller lesions within the right centrum semiovale/corona radiata measuring up to 7 mm. MRI orbits: 1. Motion degraded examination, as described and limiting evaluation. 2. Findings compatible with acute left optic neuritis, as described. 3. Within the limitations of motion degradation, no definite signal abnormality or abnormal enhancement of the right optic nerve. 4. Paranasal sinus disease as described. Electronically Signed   By: Kellie Simmering DO   On: 02/22/2021 19:28   MR CERVICAL SPINE W WO CONTRAST  Result Date: 02/23/2021 CLINICAL DATA:  Initial evaluation for demyelinating disease. EXAM: MRI CERVICAL AND THORACIC SPINE WITHOUT AND WITH CONTRAST TECHNIQUE: Multiplanar and multiecho pulse sequences of the cervical spine, to include the craniocervical junction and cervicothoracic junction, and the thoracic spine, were obtained without and with intravenous contrast. CONTRAST:  60mL GADAVIST GADOBUTROL 1 MMOL/ML IV SOLN COMPARISON:  Previous MRI from 02/22/2021. FINDINGS: MRI CERVICAL SPINE FINDINGS Alignment: Examination moderately to severely degraded by motion artifact. Straightening of the normal cervical lordosis.  No listhesis. Vertebrae: Vertebral body height maintained without acute or chronic fracture. Bone marrow signal intensity somewhat diffusely decreased on T1 weighted imaging, nonspecific, but most commonly related to anemia, smoking, or obesity. No visible discrete or worrisome osseous lesions. No abnormal marrow edema or enhancement. Cord: No convincing or obvious cord signal changes to suggest demyelinating disease on this motion degraded exam. Overall cord caliber and morphology is normal. No appreciable abnormal enhancement. Posterior Fossa, vertebral arteries, paraspinal tissues: Craniocervical junction within normal limits. Paraspinous and prevertebral soft tissues normal. Normal flow  voids seen within the vertebral arteries bilaterally. Disc levels: C2-C3: Unremarkable. C3-C4: Mild disc bulge with left greater than right uncovertebral hypertrophy. Flattening of the ventral thecal sac without significant spinal stenosis or cord deformity. Moderate left C4 foraminal narrowing. Right neural foramina remains patent. C4-C5: Mild disc bulge with uncovertebral hypertrophy. No spinal stenosis. Foramina remain patent. C5-C6: Mild disc bulge with uncovertebral hypertrophy. Mild indentation of the ventral thecal sac without significant spinal stenosis. Foramina remain patent. C6-C7: Mild disc bulge with uncovertebral hypertrophy. Broad base right paracentral disc osteophyte indents and partially effaces the ventral thecal sac. Minimal flattening of the ventral cord with mild spinal stenosis. Foramina remain patent. C7-T1: Suspected mild facet hypertrophy. Normal interspace. No stenosis. MRI THORACIC SPINE FINDINGS Alignment:  Examination severely degraded by motion artifact. Vertebral bodies normally aligned with preservation of the normal thoracic kyphosis. No listhesis. Vertebrae: Probable endplate Schmorl's node deformities with mild height loss seen at the superior endplates of F02 and L1. Associated minimal reactive endplate edema at these levels. Vertebral body height otherwise maintained. Underlying bone marrow signal intensity within normal limits. No visible discrete or worrisome osseous lesion. No other abnormal marrow edema or enhancement. Cord: Signal intensity within the thoracic spinal cord is grossly within normal limits on this motion degraded exam. No obvious or convincing cord signal abnormality to suggest demyelinating disease. No visible abnormal enhancement. Paraspinal and other soft tissues: Paraspinous soft tissues grossly within normal limits. Disc levels: T11-12: Disc desiccation with diffuse disc bulge. Superimposed central disc protrusion indents the ventral thecal sac. Mild  spinal stenosis with mild flattening of the ventral cord. Foramina remain patent. T12-L1: Disc bulge with disc desiccation. Superimposed central disc protrusion indents the ventral thecal sac, slightly eccentric to the right. Mild spinal stenosis with mild cord  flattening. Foramina remain grossly patent. No other significant disc pathology seen within the thoracic spine. No other appreciable stenosis or impingement. IMPRESSION: 1. Technically limited exam due to extensive motion artifact. 2. Grossly normal MRI appearance of the cervical and thoracic spinal cord. No obvious cord signal abnormality to suggest demyelinating disease. No abnormal enhancement. 3. Mild multilevel cervical spondylosis with resultant mild spinal stenosis at C6-7. 4. Central disc protrusions at T11-12 and T12-L1 with resultant mild spinal stenosis. Electronically Signed   By: Jeannine Boga M.D.   On: 02/23/2021 23:42   MR THORACIC SPINE W WO CONTRAST  Result Date: 02/23/2021 CLINICAL DATA:  Initial evaluation for demyelinating disease. EXAM: MRI CERVICAL AND THORACIC SPINE WITHOUT AND WITH CONTRAST TECHNIQUE: Multiplanar and multiecho pulse sequences of the cervical spine, to include the craniocervical junction and cervicothoracic junction, and the thoracic spine, were obtained without and with intravenous contrast. CONTRAST:  63mL GADAVIST GADOBUTROL 1 MMOL/ML IV SOLN COMPARISON:  Previous MRI from 02/22/2021. FINDINGS: MRI CERVICAL SPINE FINDINGS Alignment: Examination moderately to severely degraded by motion artifact. Straightening of the normal cervical lordosis.  No listhesis. Vertebrae: Vertebral body height maintained without acute or chronic fracture. Bone marrow signal intensity somewhat diffusely decreased on T1 weighted imaging, nonspecific, but most commonly related to anemia, smoking, or obesity. No visible discrete or worrisome osseous lesions. No abnormal marrow edema or enhancement. Cord: No convincing or obvious  cord signal changes to suggest demyelinating disease on this motion degraded exam. Overall cord caliber and morphology is normal. No appreciable abnormal enhancement. Posterior Fossa, vertebral arteries, paraspinal tissues: Craniocervical junction within normal limits. Paraspinous and prevertebral soft tissues normal. Normal flow voids seen within the vertebral arteries bilaterally. Disc levels: C2-C3: Unremarkable. C3-C4: Mild disc bulge with left greater than right uncovertebral hypertrophy. Flattening of the ventral thecal sac without significant spinal stenosis or cord deformity. Moderate left C4 foraminal narrowing. Right neural foramina remains patent. C4-C5: Mild disc bulge with uncovertebral hypertrophy. No spinal stenosis. Foramina remain patent. C5-C6: Mild disc bulge with uncovertebral hypertrophy. Mild indentation of the ventral thecal sac without significant spinal stenosis. Foramina remain patent. C6-C7: Mild disc bulge with uncovertebral hypertrophy. Broad base right paracentral disc osteophyte indents and partially effaces the ventral thecal sac. Minimal flattening of the ventral cord with mild spinal stenosis. Foramina remain patent. C7-T1: Suspected mild facet hypertrophy. Normal interspace. No stenosis. MRI THORACIC SPINE FINDINGS Alignment:  Examination severely degraded by motion artifact. Vertebral bodies normally aligned with preservation of the normal thoracic kyphosis. No listhesis. Vertebrae: Probable endplate Schmorl's node deformities with mild height loss seen at the superior endplates of Z02 and L1. Associated minimal reactive endplate edema at these levels. Vertebral body height otherwise maintained. Underlying bone marrow signal intensity within normal limits. No visible discrete or worrisome osseous lesion. No other abnormal marrow edema or enhancement. Cord: Signal intensity within the thoracic spinal cord is grossly within normal limits on this motion degraded exam. No obvious or  convincing cord signal abnormality to suggest demyelinating disease. No visible abnormal enhancement. Paraspinal and other soft tissues: Paraspinous soft tissues grossly within normal limits. Disc levels: T11-12: Disc desiccation with diffuse disc bulge. Superimposed central disc protrusion indents the ventral thecal sac. Mild spinal stenosis with mild flattening of the ventral cord. Foramina remain patent. T12-L1: Disc bulge with disc desiccation. Superimposed central disc protrusion indents the ventral thecal sac, slightly eccentric to the right. Mild spinal stenosis with mild cord flattening. Foramina remain grossly patent. No other significant disc pathology seen within the  thoracic spine. No other appreciable stenosis or impingement. IMPRESSION: 1. Technically limited exam due to extensive motion artifact. 2. Grossly normal MRI appearance of the cervical and thoracic spinal cord. No obvious cord signal abnormality to suggest demyelinating disease. No abnormal enhancement. 3. Mild multilevel cervical spondylosis with resultant mild spinal stenosis at C6-7. 4. Central disc protrusions at T11-12 and T12-L1 with resultant mild spinal stenosis. Electronically Signed   By: Jeannine Boga M.D.   On: 02/23/2021 23:42   DG Chest Portable 1 View  Result Date: 02/22/2021 CLINICAL DATA:  Loss of vision left eye EXAM: PORTABLE CHEST 1 VIEW COMPARISON:  October 18, 2016 FINDINGS: Lungs are clear. Heart size and pulmonary vascularity are normal. No adenopathy. No bone lesions. IMPRESSION: Lungs clear.  Heart size normal. Electronically Signed   By: Lowella Grip III M.D.   On: 02/22/2021 21:12   MR ORBITS W WO CONTRAST  Result Date: 02/22/2021 CLINICAL DATA:  Vision loss, monocular. EXAM: MRI HEAD AND ORBITS WITHOUT AND WITH CONTRAST TECHNIQUE: Multiplanar, multiecho pulse sequences of the brain and surrounding structures were obtained without and with intravenous contrast. Multiplanar, multiecho pulse  sequences of the orbits and surrounding structures were obtained including fat saturation techniques, before and after intravenous contrast administration. CONTRAST:  67mL GADAVIST GADOBUTROL 1 MMOL/ML IV SOLN COMPARISON:  No pertinent prior exams available for comparison. FINDINGS: MRI HEAD FINDINGS Brain: Mild-to-moderate intermittent motion degradation, somewhat limiting evaluation. Cerebral volume is normal for age. There is multifocal T2/FLAIR hyperintensity within the subcortical/juxtacortical and deep periventricular white matter. Many of the foci of signal abnormality are oriented perpendicular to the long axis of the lateral ventricles and findings are highly suggestive of a demyelinating process such as multiple sclerosis. A few foci of signal abnormality are also noted within the corpus callosum. Several lesions within the left centrum semiovale/corona radiata measuring up to 11 mm demonstrate corresponding restricted diffusion and subtle enhancement, and findings are compatible with active demyelination. Additionally, subtle corresponding restricted diffusion is questioned at sites of small lesions within the right centrum semiovale/corona radiata measuring up to 7 mm and active demyelination may also be present at these sites. No posterior fossa lesion is identified. No evidence of intracranial mass. No chronic intracranial blood products. No extra-axial fluid collection. No midline shift. Vascular: Expected proximal arterial flow voids. Skull and upper cervical spine: No focal marrow lesion. MRI ORBITS FINDINGS The examination is intermittently motion degraded, limiting evaluation. Most notably, there is moderate/severe motion degradation of the coronal T2 weighted sequence and moderate motion degradation of the T1 weighted postcontrast sequences. Orbits: The globes are normal in size and contour. There is T2 hyperintense signal abnormality within portions of the intraorbital left optic nerve with  associated abnormal enhancement (for instance as seen on series 20, image 17) (series 25, images 16-18). Findings are compatible with acute optic neuritis. Within the limitations of motion degradation, there is no appreciable signal abnormality or abnormal enhancement within the right optic nerve. The extraocular muscles and optic nerve sheath complexes are symmetric and unremarkable. Visualized sinuses: Paranasal sinus mucosal thickening at the imaged levels. Most notably, there is moderate left ethmoid sinus mucosal thickening. Small right maxillary sinus mucous retention cyst. Soft tissues: No appreciable abnormality of the visualized maxillofacial or upper neck soft tissues. IMPRESSION: MRI brain: 1. Motion degraded exam as described. 2. Multifocal T2/FLAIR hyperintensity within the bilateral subcortical/juxtacortical and deep periventricular white matter, and also within the corpus callosum. These foci have an appearance highly suggestive of a demyelinating process  such as multiple sclerosis. There is restricted diffusion and enhancement associated with several lesions within the left centrum semiovale/corona radiata measuring up to 11 mm, and findings are compatible with active demyelination. Active demyelination is also questioned at sites of smaller lesions within the right centrum semiovale/corona radiata measuring up to 7 mm. MRI orbits: 1. Motion degraded examination, as described and limiting evaluation. 2. Findings compatible with acute left optic neuritis, as described. 3. Within the limitations of motion degradation, no definite signal abnormality or abnormal enhancement of the right optic nerve. 4. Paranasal sinus disease as described. Electronically Signed   By: Kellie Simmering DO   On: 02/22/2021 19:28     Subjective: No acute issues or events overnight, vision markedly improving but not yet back to baseline.   Discharge Exam: Vitals:   02/25/21 2100 02/26/21 0604  BP: 109/63 115/66  Pulse:  (!) 59 (!) 44  Resp: 20 20  Temp: 98.6 F (37 C) 97.6 F (36.4 C)  SpO2: 97% 97%   Vitals:   02/24/21 2305 02/25/21 0549 02/25/21 2100 02/26/21 0604  BP: (!) 143/75 129/72 109/63 115/66  Pulse: 64 (!) 48 (!) 59 (!) 44  Resp: 16 20 20 20   Temp: 98.2 F (36.8 C) 97.9 F (36.6 C) 98.6 F (37 C) 97.6 F (36.4 C)  TempSrc:   Oral Oral  SpO2: 97% 96% 97% 97%  Weight:      Height:        General: Pt is alert, awake, not in acute distress Cardiovascular: RRR, S1/S2 +, no rubs, no gallops Respiratory: CTA bilaterally, no wheezing, no rhonchi Abdominal: Soft, NT, ND, bowel sounds + Extremities: no edema, no cyanosis    The results of significant diagnostics from this hospitalization (including imaging, microbiology, ancillary and laboratory) are listed below for reference.     Microbiology: Recent Results (from the past 240 hour(s))  Resp Panel by RT-PCR (Flu A&B, Covid) Nasopharyngeal Swab     Status: Abnormal   Collection Time: 02/22/21 11:08 PM   Specimen: Nasopharyngeal Swab; Nasopharyngeal(NP) swabs in vial transport medium  Result Value Ref Range Status   SARS Coronavirus 2 by RT PCR POSITIVE (A) NEGATIVE Final    Comment: RESULT CALLED TO, READ BACK BY AND VERIFIED WITH: CAITLYN, RN @ [phone] ON 02/23/21 C VARNER (NOTE) SARS-CoV-2 target nucleic acids are DETECTED.  The SARS-CoV-2 RNA is generally detectable in upper respiratory specimens during the acute phase of infection. Positive results are indicative of the presence of the identified virus, but do not rule out bacterial infection or co-infection with other pathogens not detected by the test. Clinical correlation with patient history and other diagnostic information is necessary to determine patient infection status. The expected result is Negative.  Fact Sheet for Patients: EntrepreneurPulse.com.au  Fact Sheet for Healthcare Providers: IncredibleEmployment.be  This test is  not yet approved or cleared by the [place] FDA and  has been authorized for detection and/or diagnosis of SARS-CoV-2 by FDA under an Emergency Use Authorization (EUA).  This EUA will remain in effect (meaning this test c an be used) for the duration of  the COVID-19 declaration under Section 564(b)(1) of the Act, 21 U.S.C. section 360bbb-3(b)(1), unless the authorization is terminated or revoked sooner.     Influenza A by PCR NEGATIVE NEGATIVE Final   Influenza B by PCR NEGATIVE NEGATIVE Final    Comment: (NOTE) The Xpert Xpress SARS-CoV-2/FLU/RSV plus assay is intended as an aid in the diagnosis of influenza from Nasopharyngeal swab  specimens and should not be used as a sole basis for treatment. Nasal washings and aspirates are unacceptable for Xpert Xpress SARS-CoV-2/FLU/RSV testing.  Fact Sheet for Patients: EntrepreneurPulse.com.au  Fact Sheet for Healthcare Providers: IncredibleEmployment.be  This test is not yet approved or cleared by the [place] FDA and has been authorized for detection and/or diagnosis of SARS-CoV-2 by FDA under an Emergency Use Authorization (EUA). This EUA will remain in effect (meaning this test can be used) for the duration of the COVID-19 declaration under Section 564(b)(1) of the Act, 21 U.S.C. section 360bbb-3(b)(1), unless the authorization is terminated or revoked.  Performed at [facility]: [address], [place] [address], [place], [place] [address]      Labs: BNP (last 3 results) No results for input(s): BNP in the last 8760 hours. Basic Metabolic Panel: Recent Labs  Lab 02/22/21 1615 02/22/21 1622 02/23/21 0450 02/24/21 0347 02/25/21 0332 02/26/21 0325  NA  --  145 139 138 138 138  K  --  3.3* 3.9 3.7 3.2* 3.4*  CL  --  106 110 107 109 108  CO2  --   --  22 24 24 23   GLUCOSE  --  86 140* 125* 129* 142*  BUN  --  8 14 13 11 9   CREATININE  --  0.70 0.57 0.55 0.60 0.52  CALCIUM   --   --  8.7* 8.6* 8.2* 8.0*  MG 2.0  --   --   --   --   --    Liver Function Tests: Recent Labs  Lab 02/24/21 0347 02/25/21 0332 02/26/21 0325  AST 12* 12* 31  ALT 11 12 23   ALKPHOS 46 43 42  BILITOT 0.6 0.6 0.6  PROT 6.1* 6.0* 5.7*  ALBUMIN 3.2* 3.1* 2.9*   No results for input(s): LIPASE, AMYLASE in the last 168 hours. No results for input(s): AMMONIA in the last 168 hours. CBC: Recent Labs  Lab 02/22/21 1615 02/22/21 1622 02/23/21 0450 02/24/21 0347 02/25/21 0332 02/26/21 0325  WBC 6.3  --  5.6 8.7 8.0 5.4  NEUTROABS 3.9  --   --   --   --   --   HGB 11.0* 12.2 10.4* 9.8* 9.3* 9.4*  HCT 35.8* 36.0 33.7* 32.1* 30.7* 31.0*  MCV 82.7  --  82.2 83.2 83.0 82.2  PLT 272  --  242 235 197 179   Cardiac Enzymes: No results for input(s): CKTOTAL, CKMB, CKMBINDEX, TROPONINI in the last 168 hours. BNP: Invalid input(s): POCBNP CBG: No results for input(s): GLUCAP in the last 168 hours. D-Dimer No results for input(s): DDIMER in the last 72 hours. Hgb A1c No results for input(s): HGBA1C in the last 72 hours. Lipid Profile No results for input(s): CHOL, HDL, LDLCALC, TRIG, CHOLHDL, LDLDIRECT in the last 72 hours. Thyroid function studies No results for input(s): TSH, T4TOTAL, T3FREE, THYROIDAB in the last 72 hours.  Invalid input(s): FREET3 Anemia work up No results for input(s): VITAMINB12, FOLATE, FERRITIN, TIBC, IRON, RETICCTPCT in the last 72 hours. Urinalysis    Component Value Date/Time   COLORURINE YELLOW 02/22/2021 2230   APPEARANCEUR CLEAR 02/22/2021 2230   APPEARANCEUR Clear 09/11/2017 1112   LABSPEC 1.016 02/22/2021 2230   PHURINE 5.0 02/22/2021 2230   GLUCOSEU NEGATIVE 02/22/2021 2230   HGBUR MODERATE (A) 02/22/2021 2230   BILIRUBINUR NEGATIVE 02/22/2021 2230   BILIRUBINUR Negative 09/11/2017 [place] 02/22/2021 2230   PROTEINUR NEGATIVE 02/22/2021 2230   UROBILINOGEN 0.2 12/05/2014 1914  NITRITE NEGATIVE 02/22/2021 2230    LEUKOCYTESUR NEGATIVE 02/22/2021 2230   Sepsis Labs Invalid input(s): PROCALCITONIN,  WBC,  LACTICIDVEN Microbiology Recent Results (from the past 240 hour(s))  Resp Panel by RT-PCR (Flu A&B, Covid) Nasopharyngeal Swab     Status: Abnormal   Collection Time: 02/22/21 11:08 PM   Specimen: Nasopharyngeal Swab; Nasopharyngeal(NP) swabs in vial transport medium  Result Value Ref Range Status   SARS Coronavirus 2 by RT PCR POSITIVE (A) NEGATIVE Final    Comment: RESULT CALLED TO, READ BACK BY AND VERIFIED WITH: CAITLYN, RN @ [phone] ON 02/23/21 C VARNER (NOTE) SARS-CoV-2 target nucleic acids are DETECTED.  The SARS-CoV-2 RNA is generally detectable in upper respiratory specimens during the acute phase of infection. Positive results are indicative of the presence of the identified virus, but do not rule out bacterial infection or co-infection with other pathogens not detected by the test. Clinical correlation with patient history and other diagnostic information is necessary to determine patient infection status. The expected result is Negative.  Fact Sheet for Patients: EntrepreneurPulse.com.au  Fact Sheet for Healthcare Providers: IncredibleEmployment.be  This test is not yet approved or cleared by the [place] FDA and  has been authorized for detection and/or diagnosis of SARS-CoV-2 by FDA under an Emergency Use Authorization (EUA).  This EUA will remain in effect (meaning this test c an be used) for the duration of  the COVID-19 declaration under Section 564(b)(1) of the Act, 21 U.S.C. section 360bbb-3(b)(1), unless the authorization is terminated or revoked sooner.     Influenza A by PCR NEGATIVE NEGATIVE Final   Influenza B by PCR NEGATIVE NEGATIVE Final    Comment: (NOTE) The Xpert Xpress SARS-CoV-2/FLU/RSV plus assay is intended as an aid in the diagnosis of influenza from Nasopharyngeal swab specimens and should not be used as a sole  basis for treatment. Nasal washings and aspirates are unacceptable for Xpert Xpress SARS-CoV-2/FLU/RSV testing.  Fact Sheet for Patients: EntrepreneurPulse.com.au  Fact Sheet for Healthcare Providers: IncredibleEmployment.be  This test is not yet approved or cleared by the [place] FDA and has been authorized for detection and/or diagnosis of SARS-CoV-2 by FDA under an Emergency Use Authorization (EUA). This EUA will remain in effect (meaning this test can be used) for the duration of the COVID-19 declaration under Section 564(b)(1) of the Act, 21 U.S.C. section 360bbb-3(b)(1), unless the authorization is terminated or revoked.  Performed at [facility], [place] [address], [place], [place] [address]      Time coordinating discharge: Over 30 minutes  SIGNED:   Little Ishikawa, DO [facility] 02/26/2021, 8:04 AM Pager   If 7PM-7AM, please contact night-coverage [email or web address]

## 2021-02-26 NOTE — Progress Notes (Signed)
Brief Neuro Update:  I attempted to see Ms. Dura today to assess her vision after steroids. Patient was made aware that I was on my way by the RN. Patient did not want to wait and left a couple mins before I could get to the floor to see her. I unfortunately was unable to examine her vision after completion of 5 days of IV steroids.   [place] Pager Number [phone]

## 2021-02-26 NOTE — Plan of Care (Signed)

## 2021-02-26 NOTE — Progress Notes (Signed)
Neurology Progress Note Subjective: Patient sitting at bedside on examination today, states that she is ready to go home Endorses improvement in her vision since completion of 5 days of Solu-Medrol States that her eyesight was completely black in the left eye Thursday when she arrived and improved to gray on Friday morning. Today, her vision has improved to where she is able to make out objects and some details but describes her vision as "looking through a fog" and "wavy". Her pain has also improved. She states that it is now just tender/aggravated/noticible but not painful like it was on presentation. The tenderness is more noticeable with left lateral gaze.  Exam: Vitals:   02/26/21 0604 02/26/21 1000  BP: 115/66   Pulse: (!) 44 65  Resp: 20   Temp: 97.6 F (36.4 C)   SpO2: 97%    Gen: Sitting up at bedside, dressed and packed for discharge. In no acute distress. Resp: non-labored breathing, no respiratory distress Abd: soft, non-tender  Neuro: MS: Alert and oriented to person, place, time, and situation. She is able to give a clear and coherent history of present illness. Follows commands. Speech is intact without dysarthria or aphasia. No neglect noted. CN: PERRL, visual fields full, no ptosis noted, EOMI, face is symmetric resting and smiling, sensation intact and symmetric to face bilaterally, hearing intact to voice, shoulder shrug symmetric, tongue protrudes midline, phonation normal, palate elevates symmetrically Motor: 5/5 strength in all extremities without pronator drift Sensory: Intact and symmetric to light touch in upper and lower extremities. DTR: 2+ and symmetric throughout  Pertinent Labs: CBC    Component Value Date/Time   WBC 5.4 02/26/2021 0325   RBC 3.77 (L) 02/26/2021 0325   HGB 9.4 (L) 02/26/2021 0325   HGB 8.9 (L) 03/03/2020 1452   HGB 11.2 01/26/2018 0909   HCT 31.0 (L) 02/26/2021 0325   HCT 34.5 01/26/2018 0909   PLT 179 02/26/2021 0325   PLT 219  03/03/2020 1452   PLT 214 01/26/2018 0909   MCV 82.2 02/26/2021 0325   MCV 88 01/26/2018 0909   MCH 24.9 (L) 02/26/2021 0325   MCHC 30.3 02/26/2021 0325   RDW 17.1 (H) 02/26/2021 0325   RDW 13.7 01/26/2018 0909   LYMPHSABS 2.1 02/22/2021 1615   MONOABS 0.2 02/22/2021 1615   EOSABS 0.1 02/22/2021 1615   BASOSABS 0.0 02/22/2021 1615   CMP     Component Value Date/Time   NA 138 02/26/2021 0325   NA 136 10/12/2019 1030   K 3.4 (L) 02/26/2021 0325   CL 108 02/26/2021 0325   CO2 23 02/26/2021 0325   GLUCOSE 142 (H) 02/26/2021 0325   BUN 9 02/26/2021 0325   BUN 8 10/12/2019 1030   CREATININE 0.52 02/26/2021 0325   CREATININE 0.62 03/03/2020 1452   CALCIUM 8.0 (L) 02/26/2021 0325   PROT 5.7 (L) 02/26/2021 0325   ALBUMIN 2.9 (L) 02/26/2021 0325   AST 31 02/26/2021 0325   AST 10 (L) 03/03/2020 1452   ALT 23 02/26/2021 0325   ALT 10 03/03/2020 1452   ALKPHOS 42 02/26/2021 0325   BILITOT 0.6 02/26/2021 0325   BILITOT 0.4 03/03/2020 1452   GFRNONAA >60 02/26/2021 0325   GFRNONAA >60 03/03/2020 1452   GFRAA >60 08/15/2020 0354   GFRAA >60 03/03/2020 1452   NMO-igG < 1.5 Angiotensin-Converting Enzyme: 56 MOG Antibody, Cell-based IFA Negative   Lab Results  Component Value Date   VITAMINB12 152 (L) 02/23/2021   Results for orders placed or  performed during the hospital encounter of 02/22/21  Resp Panel by RT-PCR (Flu A&B, Covid) Nasopharyngeal Swab     Status: Abnormal   Collection Time: 02/22/21 11:08 PM   Specimen: Nasopharyngeal Swab; Nasopharyngeal(NP) swabs in vial transport medium  Result Value Ref Range Status   SARS Coronavirus 2 by RT PCR POSITIVE (A) NEGATIVE Final    Comment: RESULT CALLED TO, Tomoki Lucken BACK BY AND VERIFIED WITH: CAITLYN, RN @ [phone] ON 02/23/21 C VARNER (NOTE) SARS-CoV-2 target nucleic acids are DETECTED.  The SARS-CoV-2 RNA is generally detectable in upper respiratory specimens during the acute phase of infection. Positive results are indicative of  the presence of the identified virus, but do not rule out bacterial infection or co-infection with other pathogens not detected by the test. Clinical correlation with patient history and other diagnostic information is necessary to determine patient infection status. The expected result is Negative.  Fact Sheet for Patients: EntrepreneurPulse.com.au  Fact Sheet for Healthcare Providers: IncredibleEmployment.be  This test is not yet approved or cleared by the [place] FDA and  has been authorized for detection and/or diagnosis of SARS-CoV-2 by FDA under an Emergency Use Authorization (EUA).  This EUA will remain in effect (meaning this test c an be used) for the duration of  the COVID-19 declaration under Section 564(b)(1) of the Act, 21 U.S.C. section 360bbb-3(b)(1), unless the authorization is terminated or revoked sooner.     Influenza A by PCR NEGATIVE NEGATIVE Final   Influenza B by PCR NEGATIVE NEGATIVE Final    Comment: (NOTE) The Xpert Xpress SARS-CoV-2/FLU/RSV plus assay is intended as an aid in the diagnosis of influenza from Nasopharyngeal swab specimens and should not be used as a sole basis for treatment. Nasal washings and aspirates are unacceptable for Xpert Xpress SARS-CoV-2/FLU/RSV testing.  Fact Sheet for Patients: EntrepreneurPulse.com.au  Fact Sheet for Healthcare Providers: IncredibleEmployment.be  This test is not yet approved or cleared by the [place] FDA and has been authorized for detection and/or diagnosis of SARS-CoV-2 by FDA under an Emergency Use Authorization (EUA). This EUA will remain in effect (meaning this test can be used) for the duration of the COVID-19 declaration under Section 564(b)(1) of the Act, 21 U.S.C. section 360bbb-3(b)(1), unless the authorization is terminated or revoked.  Performed at [facility], [place] [address], [place], [place] [address]    I have reviewed the images obtained: MRI brain and orbits with and without contrast-Both motion degraded. Brain imaging shows multiple T2/flair hyperintense lesions in the bilateral subcortical ingesta cortical and deep periventricular white matter and also along with corpus callosum, these foci have an appearance highly suggestive of a demyelinating process such as multiple sclerosis.  There is restricted diffusion and enhancement noted with several of these lesions suggesting active demyelination. MRI of the orbits shows finding compatible with acute optic neuritis on the left.  MRI C-Spine:  IMPRESSION: 1. Technically limited exam due to extensive motion artifact. 2. Grossly normal MRI appearance of the cervical and thoracic spinal cord. No obvious cord signal abnormality to suggest demyelinating disease. No abnormal enhancement. 3. Mild multilevel cervical spondylosis with resultant mild spinal stenosis at C6-7. 4. Central disc protrusions at T11-12 and T12-L1 with resultant mild spinal stenosis.  MRI T-Spine: IMPRESSION: 1. Technically limited exam due to extensive motion artifact. 2. Grossly normal MRI appearance of the cervical and thoracic spinal cord. No obvious cord signal abnormality to suggest demyelinating disease. No abnormal enhancement. 3. Mild multilevel cervical spondylosis with resultant mild spinal  stenosis at C6-7. 4. Central disc protrusions at T11-12 and T12-L1 with resultant mild spinal stenosis.  Assessment:  35 year old with who presented with about 7 to 8 days worth of worsening monocular painful vision loss and edema of the optic disc of the left eye seen on ophthalmological exam, followed by MRI of the brain and orbits that showed demyelinating lesions in the brain and optic neuritis of the left optic nerve. Highly consistent with demyelinating process such as multiple sclerosis. S/p 5 days of Solu-Medrol with some reported  improvement in vision.  Impression: Left optic neuritis New onset demyelination-differential includes MS with NMO and anti-MOg testing negative  Recommendations:  Patient discharged before final recommendations provided, advised by NP to wait for attending MD evaluation prior to discharge but left before evaluation and final recommendations. Spoke with patient about importance of follow up. Patient verbalized understanding B12 low, recommend supplementation and rechecking levels in one month Outpatient follow-up with Dr. Felecia Shelling at [facility] neurology for discussion on long-term disease modifying therapy.  Anibal Henderson, AGACNP-BC [facility] [phone]

## 2021-02-26 NOTE — Discharge Instructions (Signed)
Multiple Sclerosis Multiple sclerosis (MS) is a disease of the brain, spinal cord, and optic nerves (central nervous system). It causes the body's disease-fighting (immune) system to destroy the protective covering (myelin sheath) around nerves in the brain. When this happens, signals (nerve impulses) going to and from the brain and spinal cord do not get sent properly or may not get sent at all. There are several types of MS:  Relapsing-remitting MS. This is the most common type. This causes sudden attacks of symptoms. After an attack, you may recover completely until the next attack, or some symptoms may remain permanently.  Secondary progressive MS. This usually develops after the onset of relapsing-remitting MS. Similar to relapsing-remitting MS, this type also causes sudden attacks of symptoms. Attacks may be less frequent, but symptoms slowly get worse (progress) over time.  Primary progressive MS. This causes symptoms that steadily progress over time. This type of MS does not cause sudden attacks of symptoms. The age of onset of MS varies, but it often develops between 48-45 years of age. MS is a lifelong (chronic) condition. There is no cure, but treatment can help slow down the progression of the disease. What are the causes? The cause of this condition is not known. What increases the risk? You are more likely to develop this condition if:  You are a woman.  You have a relative with MS. However, the condition is not passed from parent to child (inherited).  You have a lack (deficiency) of vitamin D.  You smoke. MS is more common in the [place] than in the [place]. What are the signs or symptoms? Relapsing-remitting and secondary progressive MS cause symptoms to occur in episodes or attacks that may last weeks to months. There may be long periods between attacks in which there are almost no symptoms. Primary progressive MS causes symptoms to steadily  progress after they develop. Symptoms of MS vary because of the many different ways it affects the central nervous system. The main symptoms include:  Vision problems and eye pain.  Numbness and weakness.  Inability to move your arms, hands, feet, or legs (paralysis).  Balance problems.  Shaking that you cannot control (tremors).  Muscle spasms.  Problems with thinking (cognitive changes). MS can also cause symptoms that are associated with the disease, but are not always the direct result of an MS attack. They may include:  Inability to control urination or bowel movements (incontinence).  Headaches.  Fatigue.  Inability to tolerate heat.  Emotional changes.  Depression.  Pain. How is this diagnosed? This condition is diagnosed based on:  Your symptoms.  A neurological exam. This involves checking central nervous system function, such as nerve function, reflexes, and coordination.  MRIs of the brain and spinal cord.  Lab tests, including a lumbar puncture that tests the fluid that surrounds the brain and spinal cord (cerebrospinal fluid).  Tests to measure the electrical activity of the brain in response to stimulation (evoked potentials). How is this treated? There is no cure for MS, but medicines can help decrease the number and frequency of attacks and help relieve nuisance symptoms. Treatment options may include:  Medicines that reduce the frequency of attacks. These medicines may be given by injection, by mouth (orally), or through an IV.  Medicines that reduce inflammation (steroids). These may provide short-term relief of symptoms.  Medicines to help control pain, depression, fatigue, or incontinence.  Nutritional counseling. Vitamin D supplements, if you have a deficiency.  Using  devices to help you move around (assistive devices), such as braces, a cane, or a walker.  Physical therapy to strengthen and stretch your muscles.  Occupational therapy to  help you with everyday tasks.  Alternative or complementary treatments such as exercise, massage, or acupuncture.   Follow these instructions at home:  Take over-the-counter and prescription medicines only as told by your health care provider.  Do not drive or use heavy machinery while taking prescription pain medicine.  Use assistive devices as recommended by your physical therapist or your health care provider.  Exercise as directed by your health care provider.  Eating healthy can help manage MS symptoms.  Return to your normal activities as told by your health care provider. Ask your health care provider what activities are safe for you.  Reach out for support. Share your feelings with friends, family, or a support group.  Keep all follow-up visits as told by your health care provider and therapists. This is important. Where to find more information  National Multiple Sclerosis Society: https://www.nationalmssociety.[place] of Neurological Disorders and Stroke: http://hendricks-barton.net/  [employer]: [employer]our[employer].dk Contact a health care provider if:  You feel depressed.  You develop new pain or numbness.  You have tremors.  You have problems with sexual function. Get help right away if:  You develop paralysis.  You develop numbness.  You have problems with your bladder or bowel function.  You develop double vision.  You lose vision in one or both eyes.  You develop suicidal thoughts.  You develop severe confusion. If you ever feel like you may hurt yourself or others, or have thoughts about taking your own life, get help right away. You can go to your nearest emergency department or call:  Your local emergency services (911 in the [place]  A suicide crisis helpline, such as the [facility] at 405-478-2229. This is open 24 hours a day. Summary  Multiple  sclerosis (MS) is a disease of the central nervous system that causes the body's immune system to destroy the protective covering (myelin sheath) around nerves in the brain.  There are 3 types of MS: relapsing-remitting, secondary progressive, and primary progressive. Relapsing-remitting and secondary progressive MS cause symptoms to occur in episodes or attacks that may last weeks to months. Primary progressive MS causes symptoms to steadily progress after they develop.  There is no cure for MS, but medicines can help decrease the number and frequency of attacks and help relieve nuisance symptoms. Treatment may also include physical or occupational therapy.  If you develop numbness, paralysis, visio  Multiple Sclerosis Multiple sclerosis (MS) is a disease of the brain, spinal cord, and optic nerves (central nervous system). It causes the body's disease-fighting (immune) system to destroy the protective covering (myelin sheath) around nerves in the brain. When this happens, signals (nerve impulses) going to and from the brain and spinal cord do not get sent properly or may not get sent at all. There are several types of MS: Relapsing-remitting MS. This is the most common type. This causes sudden attacks of symptoms. After an attack, you may recover completely until the next attack, or some symptoms may remain permanently. Secondary progressive MS. This usually develops after the onset of relapsing-remitting MS. Similar to relapsing-remitting MS, this type also causes sudden attacks of symptoms. Attacks may be less frequent, but symptoms slowly get worse (progress) over time. Primary progressive MS. This causes symptoms that steadily progress over time. This  type of MS does not cause sudden attacks of symptoms. The age of onset of MS varies, but it often develops between 67-28 years of age. MS is a lifelong (chronic) condition. There is no cure, but treatment can help slow down the progression of the  disease. What are the causes? The cause of this condition is not known. What increases the risk? You are more likely to develop this condition if: You are a woman. You have a relative with MS. However, the condition is not passed from parent to child (inherited). You have a lack (deficiency) of vitamin D. You smoke. MS is more common in the [place] than in the [place]. What are the signs or symptoms? Relapsing-remitting and secondary progressive MS cause symptoms to occur in episodes or attacks that may last weeks to months. There may be long periods between attacks in which there are almost no symptoms. Primary progressive MS causes symptoms to steadily progress after they develop. Symptoms of MS vary because of the many different ways it affects the central nervous system. The main symptoms include: Vision problems and eye pain. Numbness and weakness. Inability to move your arms, hands, feet, or legs (paralysis). Balance problems. Shaking that you cannot control (tremors). Muscle spasms. Problems with thinking (cognitive changes). MS can also cause symptoms that are associated with the disease, but are not always the direct result of an MS attack. They may include: Inability to control urination or bowel movements (incontinence). Headaches. Fatigue. Inability to tolerate heat. Emotional changes. Depression. Pain. How is this diagnosed? This condition is diagnosed based on: Your symptoms. A neurological exam. This involves checking central nervous system function, such as nerve function, reflexes, and coordination. MRIs of the brain and spinal cord. Lab tests, including a lumbar puncture that tests the fluid that surrounds the brain and spinal cord (cerebrospinal fluid). Tests to measure the electrical activity of the brain in response to stimulation (evoked potentials). How is this treated? There is no cure for MS, but medicines can help decrease  the number and frequency of attacks and help relieve nuisance symptoms. Treatment options may include: Medicines that reduce the frequency of attacks. These medicines may be given by injection, by mouth (orally), or through an IV. Medicines that reduce inflammation (steroids). These may provide short-term relief of symptoms. Medicines to help control pain, depression, fatigue, or incontinence. Nutritional counseling. Vitamin D supplements, if you have a deficiency. Using devices to help you move around (assistive devices), such as braces, a cane, or a walker. Physical therapy to strengthen and stretch your muscles. Occupational therapy to help you with everyday tasks. Alternative or complementary treatments such as exercise, massage, or acupuncture.   Follow these instructions at home: Take over-the-counter and prescription medicines only as told by your health care provider. Do not drive or use heavy machinery while taking prescription pain medicine. Use assistive devices as recommended by your physical therapist or your health care provider. Exercise as directed by your health care provider. Eating healthy can help manage MS symptoms. Return to your normal activities as told by your health care provider. Ask your health care provider what activities are safe for you. Reach out for support. Share your feelings with friends, family, or a support group. Keep all follow-up visits as told by your health care provider and therapists. This is important. Where to find more information National Multiple Sclerosis Society: https://www.nationalmssociety.[place] of Neurological Disorders and Stroke: http://hendricks-barton.net/ [employer]:  GourmetRating.dk Contact a health care provider if: You feel depressed. You develop new pain or numbness. You have tremors. You have problems with sexual function. Get help right away if: You  develop paralysis. You develop numbness. You have problems with your bladder or bowel function. You develop double vision. You lose vision in one or both eyes. You develop suicidal thoughts. You develop severe confusion. If you ever feel like you may hurt yourself or others, or have thoughts about taking your own life, get help right away. You can go to your nearest emergency department or call: Your local emergency services (911 in the [place] A suicide crisis helpline, such as the [facility] at 972-344-2472. This is open 24 hours a day. Summary Multiple sclerosis (MS) is a disease of the central nervous system that causes the body's immune system to destroy the protective covering (myelin sheath) around nerves in the brain. There are 3 types of MS: relapsing-remitting, secondary progressive, and primary progressive. Relapsing-remitting and secondary progressive MS cause symptoms to occur in episodes or attacks that may last weeks to months. Primary progressive MS causes symptoms to steadily progress after they develop. There is no cure for MS, but medicines can help decrease the number and frequency of attacks and help relieve nuisance symptoms. Treatment may also include physical or occupational therapy. If you develop numbness, paralysis, vision problems, or other neurological symptoms, get help right away. This information is not intended to replace advice given to you by your health care provider. Make sure you discuss any questions you have with your health care provider. Document Revised: 08/29/2020 Document Reviewed: 08/29/2020 Elsevier Patient Education  2021 [place].    Multiple Sclerosis Multiple sclerosis (MS) is a disease of the brain, spinal cord, and optic nerves (central nervous system). It causes the body's disease-fighting (immune) system to destroy the protective covering (myelin sheath) around nerves in the brain. When this happens, signals  (nerve impulses) going to and from the brain and spinal cord do not get sent properly or may not get sent at all. There are several types of MS: Relapsing-remitting MS. This is the most common type. This causes sudden attacks of symptoms. After an attack, you may recover completely until the next attack, or some symptoms may remain permanently. Secondary progressive MS. This usually develops after the onset of relapsing-remitting MS. Similar to relapsing-remitting MS, this type also causes sudden attacks of symptoms. Attacks may be less frequent, but symptoms slowly get worse (progress) over time. Primary progressive MS. This causes symptoms that steadily progress over time. This type of MS does not cause sudden attacks of symptoms. The age of onset of MS varies, but it often develops between 53-13 years of age. MS is a lifelong (chronic) condition. There is no cure, but treatment can help slow down the progression of the disease. What are the causes? The cause of this condition is not known. What increases the risk? You are more likely to develop this condition if: You are a woman. You have a relative with MS. However, the condition is not passed from parent to child (inherited). You have a lack (deficiency) of vitamin D. You smoke. MS is more common in the [place] than in the [place]. What are the signs or symptoms? Relapsing-remitting and secondary progressive MS cause symptoms to occur in episodes or attacks that may last weeks to months. There may be long periods between attacks in which there are almost no symptoms. Primary progressive  MS causes symptoms to steadily progress after they develop. Symptoms of MS vary because of the many different ways it affects the central nervous system. The main symptoms include: Vision problems and eye pain. Numbness and weakness. Inability to move your arms, hands, feet, or legs (paralysis). Balance problems. Shaking that  you cannot control (tremors). Muscle spasms. Problems with thinking (cognitive changes). MS can also cause symptoms that are associated with the disease, but are not always the direct result of an MS attack. They may include: Inability to control urination or bowel movements (incontinence). Headaches. Fatigue. Inability to tolerate heat. Emotional changes. Depression. Pain. How is this diagnosed? This condition is diagnosed based on: Your symptoms. A neurological exam. This involves checking central nervous system function, such as nerve function, reflexes, and coordination. MRIs of the brain and spinal cord. Lab tests, including a lumbar puncture that tests the fluid that surrounds the brain and spinal cord (cerebrospinal fluid). Tests to measure the electrical activity of the brain in response to stimulation (evoked potentials). How is this treated? There is no cure for MS, but medicines can help decrease the number and frequency of attacks and help relieve nuisance symptoms. Treatment options may include: Medicines that reduce the frequency of attacks. These medicines may be given by injection, by mouth (orally), or through an IV. Medicines that reduce inflammation (steroids). These may provide short-term relief of symptoms. Medicines to help control pain, depression, fatigue, or incontinence. Nutritional counseling. Vitamin D supplements, if you have a deficiency. Using devices to help you move around (assistive devices), such as braces, a cane, or a walker. Physical therapy to strengthen and stretch your muscles. Occupational therapy to help you with everyday tasks. Alternative or complementary treatments such as exercise, massage, or acupuncture.   Follow these instructions at home: Take over-the-counter and prescription medicines only as told by your health care provider. Do not drive or use heavy machinery while taking prescription pain medicine. Use assistive devices as  recommended by your physical therapist or your health care provider. Exercise as directed by your health care provider. Eating healthy can help manage MS symptoms. Return to your normal activities as told by your health care provider. Ask your health care provider what activities are safe for you. Reach out for support. Share your feelings with friends, family, or a support group. Keep all follow-up visits as told by your health care provider and therapists. This is important. Where to find more information National Multiple Sclerosis Society: https://www.nationalmssociety.[place] of Neurological Disorders and Stroke: http://hendricks-barton.net/ [employer]: GourmetRating.dk Contact a health care provider if: You feel depressed. You develop new pain or numbness. You have tremors. You have problems with sexual function. Get help right away if: You develop paralysis. You develop numbness. You have problems with your bladder or bowel function. You develop double vision. You lose vision in one or both eyes. You develop suicidal thoughts. You develop severe confusion. If you ever feel like you may hurt yourself or others, or have thoughts about taking your own life, get help right away. You can go to your nearest emergency department or call: Your local emergency services (911 in the [place] A suicide crisis helpline, such as the [facility] at (602)571-5459. This is open 24 hours a day. Summary Multiple sclerosis (MS) is a disease of the central nervous system that causes the body's immune system to destroy the protective covering (myelin sheath) around nerves in the brain. There are 3  types of MS: relapsing-remitting, secondary progressive, and primary progressive. Relapsing-remitting and secondary progressive MS cause symptoms to occur in episodes or attacks that may last weeks to months. Primary  progressive MS causes symptoms to steadily progress after they develop. There is no cure for MS, but medicines can help decrease the number and frequency of attacks and help relieve nuisance symptoms. Treatment may also include physical or occupational therapy. If you develop numbness, paralysis, vision problems, or other neurological symptoms, get help right away. This information is not intended to replace advice given to you by your health care provider. Make sure you discuss any questions you have with your health care provider. Document Revised: 08/29/2020 Document Reviewed: 08/29/2020 Elsevier Patient Education  2021 [place].  n problems, or other neurological symptoms, get help right away. This information is not intended to replace advice given to you by your health care provider. Make sure you discuss any questions you have with your health care provider. Document Revised: 08/29/2020 Document Reviewed: 08/29/2020 Elsevier Patient Education  2021 [employer].

## 2021-03-05 DIAGNOSIS — K625 Hemorrhage of anus and rectum: Secondary | ICD-10-CM | POA: Diagnosis not present

## 2021-03-05 DIAGNOSIS — K6289 Other specified diseases of anus and rectum: Secondary | ICD-10-CM | POA: Diagnosis not present

## 2021-03-06 ENCOUNTER — Ambulatory Visit: Payer: 59 | Admitting: Neurology

## 2021-03-07 ENCOUNTER — Ambulatory Visit: Payer: No Typology Code available for payment source | Admitting: Neurology

## 2021-03-07 ENCOUNTER — Encounter: Payer: Self-pay | Admitting: Neurology

## 2021-03-07 ENCOUNTER — Ambulatory Visit (INDEPENDENT_AMBULATORY_CARE_PROVIDER_SITE_OTHER): Payer: No Typology Code available for payment source | Admitting: Neurology

## 2021-03-07 ENCOUNTER — Telehealth: Payer: Self-pay | Admitting: *Deleted

## 2021-03-07 ENCOUNTER — Other Ambulatory Visit: Payer: Self-pay

## 2021-03-07 VITALS — BP 121/75 | HR 72 | Ht 71.0 in | Wt 270.0 lb

## 2021-03-07 DIAGNOSIS — G379 Demyelinating disease of central nervous system, unspecified: Secondary | ICD-10-CM

## 2021-03-07 DIAGNOSIS — E559 Vitamin D deficiency, unspecified: Secondary | ICD-10-CM | POA: Diagnosis not present

## 2021-03-07 DIAGNOSIS — G35 Multiple sclerosis: Secondary | ICD-10-CM

## 2021-03-07 DIAGNOSIS — Z9884 Bariatric surgery status: Secondary | ICD-10-CM | POA: Diagnosis not present

## 2021-03-07 DIAGNOSIS — G35D Multiple sclerosis, unspecified: Secondary | ICD-10-CM

## 2021-03-07 DIAGNOSIS — Z79899 Other long term (current) drug therapy: Secondary | ICD-10-CM

## 2021-03-07 MED ORDER — CYANOCOBALAMIN 1000 MCG/ML IJ SOLN: INTRAMUSCULAR | 1 refills | Status: DC

## 2021-03-07 NOTE — Progress Notes (Signed)
[facility]  PATIENT: Lynn Roberts DOB: 06-Jun-1986  REFERRING DOCTOR OR PCP:  Dr. Tama High (Ophthalmology); Amie Portland, MD (Neuro-hospitalist); Dionisio David (PCP) SOURCE:   _________________________________   HISTORICAL  CHIEF COMPLAINT:  Chief Complaint  Patient presents with  . New Patient (Initial Visit)    RM 13, alone. Internal referral from [facility] for optic neuritis/demyelinating changes on MRI. She was at [facility] 02/22/2021 - 02/26/2021. Received IV steroidsx5 days. Has had a dull headache that is intermittent on a daily basis. She reports nausea, thigh muscles are fatigued. Feels like she has walked miles.     HISTORY OF PRESENT ILLNESS:  I had the pleasure seeing your patient, Lynn Roberts, at the [place] [facility] for neurologic consultation regarding her recent diagnosis of multiple sclerosis.  She is a 35 year old woman who presented to [place] 02/22/2021 after she had the onset of left eye pain, headache and complete loss of vision OS 02/21/2021.  She saw Dr. Brigitte Pulse (ophthalmology) who advised her to go to the ED.    She had studies and was admitted and received 5 days of IV steroids.  MRI of the orbits confirmed left optic neuritis.  MRI of the brain showed multiple foci consistent with MS.  2 foci in the left hemisphere enhanced after contrast.  MRI of the spine did not show any additional lesions though there was extensive movement artifact that limited the study.   She had no other neurologic episode in the past.   She began to improve a day after the IV Solu-Medrol was initiated and was much better by discharge.     Currently, vision on the left is mildly reduced (20/70) and she has reduced color vision.  She has a mild headache that is dull.   She notes nausea, helped by ondansetron.    She denies numbness, weakness or gait issues.  However, she feels her thighs feel like she has walked a couple miles.    She also has had  some aching in the shoulder region, though this has improved.     She is otherwise fairly healthy and on no meds.    She was > 650 pounds and had bariatric surgery (duodenal switch), losing > 400 pounds in 2015.   She had an anal fissure last year requiring surgery and still has some mild bleeding.   She has seen surgery and they have requested a colonoscopy.     She smokes and is trying to quit (using patches).   She has two kids ages 21 and 44.       Imaging: MRI of the brain 02/22/2021 shows multiple T2/FLAIR hyperintense foci in the hemispheres in a pattern consistent with multiple sclerosis.  2 foci on the left show subtle enhancement after contrast.  MRI of the orbits 02/22/2021 shows enhancement of the left optic nerve.  MRI of the cervical spine 02/23/2021 limited by movement artifact shows a normal spinal cord and mild multilevel degenerative changes.  MRI of the thoracic spine 02/23/2021 limited by movement artifact shows a normal spinal cord and degenerative changes with mild spinal stenosis at T11-T12 and T12-L1.   Laboratory tests March 2022:   Anti-NMO and anti-MOG are negative.  B12 low at 152 (180-914).   EKG normal 2/.2021  REVIEW OF SYSTEMS: Constitutional: No fevers, chills, sweats, or change in appetite Eyes: No visual changes, double vision, eye pain Ear, nose and throat: No hearing loss, ear pain, nasal congestion, sore throat Cardiovascular:  No chest pain, palpitations Respiratory: No shortness of breath at rest or with exertion.   No wheezes GastrointestinaI: No nausea, vomiting, diarrhea, abdominal pain, fecal incontinence Genitourinary: No dysuria, urinary retention or frequency.  No nocturia. Musculoskeletal: No neck pain, back pain Integumentary: No rash, pruritus, skin lesions Neurological: as above Psychiatric: No depression at this time.  No anxiety Endocrine: No palpitations, diaphoresis, change in appetite, change in weigh or increased  thirst Hematologic/Lymphatic: No anemia, purpura, petechiae. Allergic/Immunologic: No itchy/runny eyes, nasal congestion, recent allergic reactions, rashes  ALLERGIES: Allergies  Allergen Reactions  . Nsaids Other (See Comments)    HX. OF WEIGHT-LOSS SURGERY   . Other Rash    Reaction to LOTIONS   . Sulfa Antibiotics Rash  . Tramadol Rash    Had after 2014 and tolerated okay    HOME MEDICATIONS:  Current Outpatient Medications:  .  acetaminophen (TYLENOL) 325 MG tablet, Take 650 mg by mouth every 6 (six) hours as needed for moderate pain or headache., Disp: , Rfl:  .  Multiple Vitamin (MULTIVITAMIN WITH MINERALS) TABS tablet, Take 1 tablet by mouth daily., Disp: 30 tablet, Rfl: 0 .  nicotine (NICODERM CQ - DOSED IN MG/24 HOURS) 21 mg/24hr patch, Place 1 patch (21 mg total) onto the skin daily., Disp: 30 patch, Rfl: 0 .  ondansetron (ZOFRAN) 4 MG tablet, Take 1 tablet (4 mg total) by mouth daily as needed for nausea or vomiting., Disp: 30 tablet, Rfl: 1  PAST MEDICAL HISTORY: Past Medical History:  Diagnosis Date  . Arthritis of low back   . Complication of anesthesia    is of [name] descent  . Family history of adverse reaction to anesthesia    pt is a Lumbi [name]  . GERD (gastroesophageal reflux disease)    none since pregnancy   . Hemorrhoids   . History of PAT (paroxysmal atrial tachycardia)    had cardiac ablation as a teenager  . History of sleep apnea    none since 450 pound weight loss in 2015  . Migraines   . PCOD (polycystic ovarian disease)    no menses    PAST SURGICAL HISTORY: Past Surgical History:  Procedure Laterality Date  . BREAST LUMPECTOMY Left 2011   papilloma  . BREAST LUMPECTOMY Left 2015   papilloma  . BREAST LUMPECTOMY WITH RADIOACTIVE SEED LOCALIZATION Left 10/18/2014   Procedure:  RADIOACTIVE SEED GUIDED EXCISIONAL BREAST BIOPSY;  Surgeon: Alphonsa Overall, MD;  Location: [place];  Service: General;  Laterality: Left;  .  BREAST SURGERY Right 07/27/2004   retro-areolar dissection with exc. of large duct  . CARDIAC ELECTROPHYSIOLOGY MAPPING AND ABLATION  sge 13  . CARDIAC ELECTROPHYSIOLOGY STUDY AND ABLATION  age 18  . CESAREAN SECTION N/A 03/28/2020   Procedure: CESAREAN SECTION;  Surgeon: Deliah Boston, MD;  Location: [facility];  Service: Obstetrics;  Laterality: N/A;  RNFA [place]  . CESAREAN SECTION  03/2018  . CHOLECYSTECTOMY  04/27/2008  . GASTROPLASTY DUODENAL SWITCH  01/2014   done at [place]  . INCISION AND DRAINAGE PERIRECTAL ABSCESS N/A 08/15/2020   Procedure: ANORECTAL EXAM UNDER ANESTHESIA INCISION AND DRAINAGE OF PERIRECTAL ABSCESS x2 WITH PLACEMENT OF SETON;  Surgeon: Ileana Roup, MD;  Location: [facility];  Service: General;  Laterality: N/A;  . OVARIAN CYST REMOVAL Bilateral 1999  . PLACEMENT OF SETON  08/15/2020  . PLACEMENT OF SETON N/A 12/08/2020   Procedure: PLACEMENT OF CUTTING SETON; PARTIAL FISTULOTOMY;  Surgeon: Ileana Roup, MD;  Location: [place];  Service: General;  Laterality: N/A;  . RECTAL EXAM UNDER ANESTHESIA N/A 12/08/2020   Procedure: ANORECTAL EXAM UNDER ANESTHESIA;  Surgeon: Ileana Roup, MD;  Location: [place];  Service: General;  Laterality: N/A;  . TOENAIL EXCISION  yrs ago  . UNILATERAL SALPINGECTOMY      FAMILY HISTORY: Family History  Problem Relation Age of Onset  . Cancer Paternal Grandfather   . Cancer Maternal Grandmother   . Heart disease Maternal Grandmother   . Diabetes Maternal Grandfather   . Hypertension Father   . Hyperlipidemia Father   . Hypertension Mother   . Diabetes Mother     SOCIAL HISTORY:  Social History   Socioeconomic History  . Marital status: Married    Spouse name: Quillian Quince  . Number of children: 2  . Years of education: 53  . Highest education level: Not on file  Occupational History  . Occupation: [employer]   Tobacco Use  . Smoking status: Current Every Day Smoker     Packs/day: 0.50    Years: 10.00    Pack years: 5.00    Types: Cigarettes  . Smokeless tobacco: Never Used  . Tobacco comment: 4-5 cigs per day  Vaping Use  . Vaping Use: Never used  Substance and Sexual Activity  . Alcohol use: No  . Drug use: No  . Sexual activity: Yes    Birth control/protection: None  Other Topics Concern  . 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    Hearing Screening   125Hz  250Hz  500Hz  1000Hz  2000Hz  3000Hz  4000Hz  6000Hz  8000Hz   Right ear:           Left ear:             Visual Acuity Screening   Right eye Left eye Both eyes  Without correction: 20/30 20/70 20/30   With correction:        General: The patient is well-developed and well-nourished and in no acute distress  HEENT:  Head is [place]/AT.  Sclera are anicteric.  Funduscopic exam shows normal optic discs and retinal vessels.  Neck: No carotid bruits are noted.  The neck is nontender.  Cardiovascular: The heart has a regular rate and rhythm with a normal S1 and S2. There were no murmurs, gallops or rubs.    Skin: Extremities are without rash or edema.  Musculoskeletal:  Back is nontender  Neurologic Exam  Mental status: The patient is alert and oriented x 3 at the time of the examination. The patient has apparent normal recent and remote memory, with an apparently normal attention span and concentration ability.   Speech is normal.  Cranial nerves:  Extraocular movements are full. Reduced color vision and acuity OS.  2+ APD on left.   Facial symmetry is present. There  is good facial sensation to soft touch bilaterally.Facial strength is normal.  Trapezius and sternocleidomastoid strength is normal. No dysarthria is noted.  The tongue is midline, and the patient has symmetric elevation of the soft palate. No obvious hearing deficits are noted.  Motor:  Muscle bulk is normal.   Tone is normal. Strength is  5 / 5 in all 4 extremities.   Sensory: Sensory testing is intact to pinprick, soft touch and vibration sensation in all 4 extremities.  Coordination: Cerebellar testing reveals good finger-nose-finger and heel-to-shin bilaterally.  Gait and station: Station is normal.   Gait is normal. Tandem gait is normal. Romberg is negative.   Reflexes: Deep tendon reflexes are symmetric and normal bilaterally.   Plantar responses are flexor.    DIAGNOSTIC DATA (LABS, IMAGING, TESTING) - I reviewed patient records, labs, notes, testing and imaging myself where available.  Lab Results  Component Value Date   WBC 5.4 02/26/2021   HGB 9.4 (L) 02/26/2021   HCT 31.0 (L) 02/26/2021   MCV 82.2 02/26/2021   PLT 179 02/26/2021      Component Value Date/Time   NA 138 02/26/2021 0325   NA 136 10/12/2019 1030   K 3.4 (L) 02/26/2021 0325   CL 108 02/26/2021 0325   CO2 23 02/26/2021 0325   GLUCOSE 142 (H) 02/26/2021 0325   BUN 9 02/26/2021 0325   BUN 8 10/12/2019 1030   CREATININE 0.52 02/26/2021 0325   CREATININE 0.62 03/03/2020 1452   CALCIUM 8.0 (L) 02/26/2021 0325   PROT 5.7 (L) 02/26/2021 0325   ALBUMIN 2.9 (L) 02/26/2021 0325   AST 31 02/26/2021 0325   AST 10 (L) 03/03/2020 1452   ALT 23 02/26/2021 0325   ALT 10 03/03/2020 1452   ALKPHOS 42 02/26/2021 0325   BILITOT 0.6 02/26/2021 0325   BILITOT 0.4 03/03/2020 1452   GFRNONAA >60 02/26/2021 0325   GFRNONAA >60 03/03/2020 1452   GFRAA >60 08/15/2020 0354   GFRAA >60 03/03/2020 1452    No results found for: CHOL, HDL, LDLCALC, LDLDIRECT, TRIG, CHOLHDL Lab Results  Component Value Date   HGBA1C 4.6 (L) 11/05/2017   Lab Results  Component Value Date   VITAMINB12 152 (L) 02/23/2021   Lab Results  Component Value Date   TSH 1.380 10/12/2019       ASSESSMENT AND PLAN  Multiple sclerosis ([place]) - Plan: QuantiFERON-TB Gold Plus, Varicella zoster antibody, IgG, Stratify JCV Antibody Test ([facility]), Other/Misc lab test  High risk medication use - Plan: QuantiFERON-TB Gold Plus, Varicella zoster antibody, IgG, Stratify JCV Antibody Test ([facility]), Other/Misc lab test  S/P bariatric surgery - Plan: VITAMIN D 25 Hydroxy (Vit-D Deficiency, Fractures), Copper, serum  Demyelinating changes in brain ([place]) - Plan: Pan-ANCA  Vitamin D deficiency - Plan: VITAMIN D 25 Hydroxy (Vit-D Deficiency, Fractures)   In summary, Ms. Hoston is a 35 yo woman with left optic neuritis and an abnormal MRI consistent with relapsing remitting MS.   We discussed that she is presenting with low to average aggressiveness.   We discussed several options and she is most interested in one of the S1P modulators and we discussed Zeposia and Mayzent in most detail.   We reviewed risks and benefits and we will check labs.   She had an EKG in 2021 that was normal.  She has  Recent eye exam.   She has low B12 and I will have her do monthly B12 shots.  Due to her bariatric surgery status, we will  also check copper.  Vit D will also be checked.    She will return to see me in 3 months or sooner if new or worsening neurologic issues.  Thank you for asking me to see Ms. Trachtenberg.  Please let me know if I can be of further assistance with her or other patients in the future.     Kalis Friese A. Felecia Shelling, MD, [facility] 12/02/6433, 3:91 PM Certified in Neurology, Clinical Neurophysiology, Sleep Medicine and Neuroimaging  [facility] [address] [place],  [address] [phone]

## 2021-03-07 NOTE — Telephone Encounter (Signed)
Placed [facility] lab in [facility] lock box for routine lab pick up. Results pending. 

## 2021-03-08 ENCOUNTER — Ambulatory Visit: Payer: No Typology Code available for payment source | Admitting: Neurology

## 2021-03-13 ENCOUNTER — Telehealth: Payer: Self-pay

## 2021-03-13 MED ORDER — VITAMIN D (ERGOCALCIFEROL) 1.25 MG (50000 UNIT) PO CAPS: 50000.0000 [IU] | ORAL_CAPSULE | ORAL | 1 refills | Status: DC

## 2021-03-13 NOTE — Telephone Encounter (Signed)
Faxed Zeposia start form to the Zeposia360 support hub.  Received a receipt of confirmation. Once we receive the ophthalmology records we can clear patient for therapy.

## 2021-03-13 NOTE — Telephone Encounter (Signed)
Spoke with Dr. Felecia Shelling.  He would like to start patient on Zeposia.  She had a recent EKG about 13 months ago.  He does not want to repeat this.  She also saw ophthalmology recently.  He needs to review his records.  He gave a verbal order for patient to start vitamin D 50,000 units once weekly for 26 weeks.  After that, she can take 5000 units of vitamin D once daily for 6 months.  This order has been placed.  I have been speaking with patient via [place].  Dr. Felecia Shelling said that patient should receive her second Covid shot, wait 1 week and then begin Zeposia.  We should go ahead and start the process for that she has medication on hand for when this occurs.  Per pt: "I went to the eye doctor first on [address] at [facility] express they done test there then he sent me to [facility] on [address] Manuella Ghazi? I think is how it is spelled."

## 2021-03-14 LAB — QUANTIFERON-TB GOLD PLUS
QuantiFERON Mitogen Value: >10 IU/mL
QuantiFERON Nil Value: 0.02 IU/mL
QuantiFERON TB1 Ag Value: 0.01 IU/mL
QuantiFERON TB2 Ag Value: 0.01 IU/mL
QuantiFERON-TB Gold Plus: NEGATIVE

## 2021-03-14 LAB — CYP2C9 GENOTYPING SIPONIMOD

## 2021-03-14 LAB — COPPER, SERUM: Copper: 71 ug/dL — ABNORMAL LOW (ref 80–158)

## 2021-03-14 LAB — PAN-ANCA
ANCA Proteinase 3: <3.5 U/mL (ref 0.0–3.5)
Atypical pANCA: <1:20 {titer}
C-ANCA: <1:20 {titer}
Myeloperoxidase Ab: <9 U/mL (ref 0.0–9.0)
P-ANCA: <1:20 {titer}

## 2021-03-14 LAB — VARICELLA ZOSTER ANTIBODY, IGG: Varicella zoster IgG: 3865 index (ref >165)

## 2021-03-14 LAB — VITAMIN D 25 HYDROXY (VIT D DEFICIENCY, FRACTURES): Vit D, 25-Hydroxy: 14.1 ng/mL — ABNORMAL LOW (ref 30.0–100.0)

## 2021-03-14 NOTE — Telephone Encounter (Signed)
JCV ab indeterminate, index: 0.37. Inhibition assay: negative. Gave to MD for review.

## 2021-03-14 NOTE — Telephone Encounter (Signed)
R/c notes from [place] and [employer] notes on [employer].

## 2021-03-19 NOTE — Telephone Encounter (Signed)
Completed PA for Zeposia via [place].  Received clinical questions that but it pertains to ulcerative colitis instead of MS. Key: [id number].

## 2021-03-19 NOTE — Telephone Encounter (Signed)
Received PA request from [facility] via fax.  Completed and faxed back to [facility].  Received a receipt of confirmation.

## 2021-03-20 NOTE — Telephone Encounter (Addendum)
I called [facility]eposia 360hub.  Spoke with [employer].  Advise her that patient is cleared to start Zeposia therapy.

## 2021-03-20 NOTE — Telephone Encounter (Signed)
PA request for Lynn Roberts has been approved from 03/19/2021 to 03/19/2022. PA# [facility] [id number]

## 2021-03-20 NOTE — Telephone Encounter (Signed)
I spoke with Dr. Felecia Shelling.  He has reviewed patient's ophthalmology records and she is cleared to start Zeposia therapy.

## 2021-03-22 ENCOUNTER — Other Ambulatory Visit: Payer: Self-pay | Admitting: *Deleted

## 2021-03-22 DIAGNOSIS — G35 Multiple sclerosis: Secondary | ICD-10-CM

## 2021-03-22 DIAGNOSIS — H469 Unspecified optic neuritis: Secondary | ICD-10-CM | POA: Diagnosis not present

## 2021-03-22 MED ORDER — PREDNISONE 50 MG PO TABS: ORAL_TABLET | ORAL | 0 refills | Status: DC

## 2021-03-22 NOTE — Telephone Encounter (Signed)
Spoke w/ Dr. Leta Baptist. He reviewed pt chart. Feels she may be having possible MS exacerbation w/ new urinary sx. Would like to call in oral steroids: prednisone 50mg , take 10 tablets po x3 days #30. She should get covid injection after sx improve and then start Zeposia a week after shot as planned. I called patient and relayed recommendation and she is agreeable to this plan. [facility]. Advised if she has new or worsening sx, she should let us know. If sx become severe over the weekend, she should proceed to ED for immediate treatment. Advised I will send to MD for review when he returns to the office for further recommendation if needed. Pt verbalized understanding.

## 2021-03-27 NOTE — Telephone Encounter (Signed)
I called Zeposia 360 support.  They have everything they need from [place] to start patient on Zeposia.  I will have the nurse navigator review the chart and reach out to patient.

## 2021-03-28 ENCOUNTER — Encounter: Payer: Self-pay | Admitting: Physician Assistant

## 2021-03-29 ENCOUNTER — Inpatient Hospital Stay: Payer: No Typology Code available for payment source | Admitting: Nurse Practitioner

## 2021-04-02 NOTE — Telephone Encounter (Signed)
[facility]  They have a government insurance health card on file.  They do not have an [employer] card that we have on file.  They will reverify her insurance and call the patient if they have clarification questions.  Otherwise, patient should be receiving a call by May 4 to set up a shipment date for Zeposia.

## 2021-04-11 ENCOUNTER — Encounter: Payer: Self-pay | Admitting: Physician Assistant

## 2021-04-11 ENCOUNTER — Ambulatory Visit (INDEPENDENT_AMBULATORY_CARE_PROVIDER_SITE_OTHER): Payer: No Typology Code available for payment source | Admitting: Physician Assistant

## 2021-04-11 VITALS — BP 110/78 | HR 65 | Ht 71.0 in | Wt 274.0 lb

## 2021-04-11 DIAGNOSIS — Z8719 Personal history of other diseases of the digestive system: Secondary | ICD-10-CM | POA: Diagnosis not present

## 2021-04-11 DIAGNOSIS — K625 Hemorrhage of anus and rectum: Secondary | ICD-10-CM | POA: Diagnosis not present

## 2021-04-11 MED ORDER — CLENPIQ 10-3.5-12 MG-GM -GM/160ML PO SOLN: 320.0000 mL | Freq: Once | ORAL | 0 refills | Status: AC

## 2021-04-11 NOTE — Patient Instructions (Signed)
If you are age 37 or older, your body mass index should be between 23-30. Your Body mass index is 38.22 kg/m. If this is out of the aforementioned range listed, please consider follow up with your Primary Care Provider.  If you are age 32 or younger, your body mass index should be between 19-25. Your Body mass index is 38.22 kg/m. If this is out of the aformentioned range listed, please consider follow up with your Primary Care Provider.   You have been scheduled for a colonoscopy. Please follow written instructions given to you at your visit today.  Please pick up your prep supplies at the pharmacy within the next 1-3 days. If you use inhalers (even only as needed), please bring them with you on the day of your procedure.  Due to recent changes in healthcare laws, you may see the results of your imaging and laboratory studies on MyChart before your provider has had a chance to review them.  We understand that in some cases there may be results that are confusing or concerning to you. Not all laboratory results come back in the same time frame and the provider may be waiting for multiple results in order to interpret others.  Please give [place] 48 hours in order for your provider to thoroughly review all the results before contacting the office for clarification of your results.   Thank you for choosing me and [facility].  Ellouise Newer, PA-C

## 2021-04-11 NOTE — Progress Notes (Signed)
Agree with the assessment and plan as outlined by Jennifer Lemmon, PA-C. ? ?Uri Turnbough, DO, FACG ? ?

## 2021-04-11 NOTE — Progress Notes (Signed)
Chief Complaint: Bright red blood per rectum  HPI:    Lynn Roberts is a 35 year old female with a past medical history as listed below including family history of adverse reaction to anesthesia (of [name] descent), who was referred to me by Dr. Michaelle Birks at [place] for rectal bleeding.    03/05/2021 patient seen in clinic at [facility] for anal pain.  Apparently had a horseshoe abscess drained in September 2021 by Dr. Dema Severin and more recently a partial fistulotomy and placement of a cutting seton in January 2022, she had last been seen 1/31 at which time the seton had successfully eroded through and she was having no further issues.  At that time described noticing nodules that are prior drain sites with a small amount of drainage, concerned about developing another abscess and presented to their clinic.  She is having regular bowel movements but did have sharp pain and had seen blood in her stool for the last week.  Described a colonoscopy more than 5 years ago.  Was recommended that she see [place] for update of her colonoscopy and then return to see Dr. Dema Severin for management of her possible perianal fistula.    Today, the patient presents to clinic and tells me she had a colonoscopy before but she cannot even remember when this was as it was "years ago".  Tells me that she dealt with a fissure back in September as above and then got a new diagnosis of MS recently and started with some bright red blood per rectum around the end of March, this was daily and now is just occasional but does continue.  She started to see some purulent discharge with it and returned to [facility] as above, she was told to come here for an update of colonoscopy before they did anything further.  Does report that she has soft daily bowel movements and no abdominal pain.    Inquired about issues with anesthesia.  She tells me she has part [name] where she may get some higher blood pressures.  Apparently they just "watch her closer".    Denies fevers,  chills, weight loss, nausea, vomiting, heartburn or reflux.  Past Medical History:  Diagnosis Date  . Arthritis of low back   . Complication of anesthesia    is of [name] descent  . Family history of adverse reaction to anesthesia    pt is a Lumbi [name]  . GERD (gastroesophageal reflux disease)    none since pregnancy   . Hemorrhoids   . History of PAT (paroxysmal atrial tachycardia)    had cardiac ablation as a teenager  . History of sleep apnea    none since 450 pound weight loss in 2015  . Migraines   . PCOD (polycystic ovarian disease)    no menses    Past Surgical History:  Procedure Laterality Date  . BREAST LUMPECTOMY Left 2011   papilloma  . BREAST LUMPECTOMY Left 2015   papilloma  . BREAST LUMPECTOMY WITH RADIOACTIVE SEED LOCALIZATION Left 10/18/2014   Procedure:  RADIOACTIVE SEED GUIDED EXCISIONAL BREAST BIOPSY;  Surgeon: Alphonsa Overall, MD;  Location: [facility];  Service: General;  Laterality: Left;  . BREAST SURGERY Right 07/27/2004   retro-areolar dissection with exc. of large duct  . CARDIAC ELECTROPHYSIOLOGY MAPPING AND ABLATION  sge 13  . CARDIAC ELECTROPHYSIOLOGY STUDY AND ABLATION  age 42  . CESAREAN SECTION N/A 03/28/2020   Procedure: CESAREAN SECTION;  Surgeon: Deliah Boston, MD;  Location: [facility] LD  ORS;  Service: Obstetrics;  Laterality: N/A;  RNFA [place]  . CESAREAN SECTION  03/2018  . CHOLECYSTECTOMY  04/27/2008  . GASTROPLASTY DUODENAL SWITCH  01/2014   done at [facility]  . INCISION AND DRAINAGE PERIRECTAL ABSCESS N/A 08/15/2020   Procedure: ANORECTAL EXAM UNDER ANESTHESIA INCISION AND DRAINAGE OF PERIRECTAL ABSCESS x2 WITH PLACEMENT OF SETON;  Surgeon: Ileana Roup, MD;  Location: [facility] ORS;  Service: General;  Laterality: N/A;  . OVARIAN CYST REMOVAL Bilateral 1999  . PLACEMENT OF SETON  08/15/2020  . PLACEMENT OF SETON N/A 12/08/2020   Procedure: PLACEMENT OF CUTTING SETON; PARTIAL FISTULOTOMY;  Surgeon: Ileana Roup, MD;   Location: [facility];  Service: General;  Laterality: N/A;  . RECTAL EXAM UNDER ANESTHESIA N/A 12/08/2020   Procedure: ANORECTAL EXAM UNDER ANESTHESIA;  Surgeon: Ileana Roup, MD;  Location: [facility];  Service: General;  Laterality: N/A;  . TOENAIL EXCISION  yrs ago  . UNILATERAL SALPINGECTOMY      Current Outpatient Medications  Medication Sig Dispense Refill  . cyanocobalamin (,VITAMIN B-12,) 1000 MCG/ML injection 1 cc subcut every 4 weeks.   Dispense with four 1 cc syringe with 25 g needle 4 mL 1  . Multiple Vitamin (MULTIVITAMIN WITH MINERALS) TABS tablet Take 1 tablet by mouth daily. 30 tablet 0  . ondansetron (ZOFRAN) 4 MG tablet Take 1 tablet (4 mg total) by mouth daily as needed for nausea or vomiting. 30 tablet 1  . predniSONE (DELTASONE) 50 MG tablet Take 10 tablets by mouth daily for three days 30 tablet 0  . Vitamin D, Ergocalciferol, (DRISDOL) 1.25 MG (50000 UNIT) CAPS capsule Take 1 capsule (50,000 Units total) by mouth every 7 (seven) days. 13 capsule 1   No current facility-administered medications for this visit.    Allergies as of 04/11/2021 - Review Complete 04/11/2021  Allergen Reaction Noted  . Nsaids Other (See Comments) 07/21/2014  . Other Rash 10/11/2014  . Sulfa antibiotics Rash 10/11/2014  . Tramadol Rash 07/08/2013    Family History  Problem Relation Age of Onset  . Cancer Paternal Grandfather   . Cancer Maternal Grandmother   . Heart disease Maternal Grandmother   . Diabetes Maternal Grandfather   . Hypertension Father   . Hyperlipidemia Father   . Hypertension Mother   . Diabetes Mother     Social History   Socioeconomic History  . Marital status: Married    Spouse name: Quillian Quince  . Number of children: 2  . Years of education: 12  . Highest education level: Not on file  Occupational History  . Occupation: [employer]   Tobacco Use  . Smoking status: Current Every Day Smoker    Packs/day: 0.50    Years: 10.00     Pack years: 5.00    Types: Cigarettes  . Smokeless tobacco: Never Used  . Tobacco comment: 4-5 cigs per day  Vaping Use  . Vaping Use: Never used  Substance and Sexual Activity  . Alcohol use: No  . Drug use: No  . Sexual activity: Yes    Birth control/protection: None  Other Topics Concern  . 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   Caffeine use: 1 cup per day of tea   Social Determinants of Health   Financial Resource Strain: Not on file  Food Insecurity: Not on file  Transportation Needs: Not on file  Physical Activity:  Not on file  Stress: Not on file  Social Connections: Not on file  Intimate Partner Violence: Not on file    Review of Systems:    Constitutional: No weight loss, fever or chills Skin: No rash  Cardiovascular: No chest pain Respiratory: No SOB  Gastrointestinal: See HPI and otherwise negative Genitourinary: No dysuria  Neurological: No headache, dizziness or syncope Musculoskeletal: No new muscle or joint pain Hematologic: No bruising Psychiatric: No history of depression or anxiety   Physical Exam:  Vital signs: BP 110/78   Pulse 65   Ht 5\' 11"  (1.803 m)   Wt 274 lb (124.3 kg)   BMI 38.22 kg/m   Constitutional:   Pleasant overweight Caucasian female appears to be in NAD, Well developed, Well nourished, alert and cooperative Head:  Normocephalic and atraumatic. Eyes:   PEERL, EOMI. No icterus. Conjunctiva pink. Ears:  Normal auditory acuity. Neck:  Supple Throat: Oral cavity and pharynx without inflammation, swelling or lesion.  Respiratory: Respirations even and unlabored. Lungs clear to auscultation bilaterally.   No wheezes, crackles, or rhonchi.  Cardiovascular: Normal S1, S2. No MRG. Regular rate and rhythm. No peripheral edema, cyanosis or pallor.  Gastrointestinal:  Soft, nondistended, nontender. No rebound or guarding. Normal bowel sounds. No appreciable masses or  hepatomegaly. Rectal:  Not performed. Thorough exam recently by [facility]. Msk:  Symmetrical without gross deformities. Without edema, no deformity or joint abnormality.  Neurologic:  Alert and  oriented x4;  grossly normal neurologically.  Skin:   Dry and intact without significant lesions or rashes. Psychiatric: Demonstrates good judgement and reason without abnormal affect or behaviors.  RELEVANT LABS AND IMAGING: CBC    Component Value Date/Time   WBC 5.4 02/26/2021 0325   RBC 3.77 (L) 02/26/2021 0325   HGB 9.4 (L) 02/26/2021 0325   HGB 8.9 (L) 03/03/2020 1452   HGB 11.2 01/26/2018 0909   HCT 31.0 (L) 02/26/2021 0325   HCT 34.5 01/26/2018 0909   PLT 179 02/26/2021 0325   PLT 219 03/03/2020 1452   PLT 214 01/26/2018 0909   MCV 82.2 02/26/2021 0325   MCV 88 01/26/2018 0909   MCH 24.9 (L) 02/26/2021 0325   MCHC 30.3 02/26/2021 0325   RDW 17.1 (H) 02/26/2021 0325   RDW 13.7 01/26/2018 0909   LYMPHSABS 2.1 02/22/2021 1615   MONOABS 0.2 02/22/2021 1615   EOSABS 0.1 02/22/2021 1615   BASOSABS 0.0 02/22/2021 1615    CMP     Component Value Date/Time   NA 138 02/26/2021 0325   NA 136 10/12/2019 1030   K 3.4 (L) 02/26/2021 0325   CL 108 02/26/2021 0325   CO2 23 02/26/2021 0325   GLUCOSE 142 (H) 02/26/2021 0325   BUN 9 02/26/2021 0325   BUN 8 10/12/2019 1030   CREATININE 0.52 02/26/2021 0325   CREATININE 0.62 03/03/2020 1452   CALCIUM 8.0 (L) 02/26/2021 0325   PROT 5.7 (L) 02/26/2021 0325   ALBUMIN 2.9 (L) 02/26/2021 0325   AST 31 02/26/2021 0325   AST 10 (L) 03/03/2020 1452   ALT 23 02/26/2021 0325   ALT 10 03/03/2020 1452   ALKPHOS 42 02/26/2021 0325   BILITOT 0.6 02/26/2021 0325   BILITOT 0.4 03/03/2020 1452   GFRNONAA >60 02/26/2021 0325   GFRNONAA >60 03/03/2020 1452   GFRAA >60 08/15/2020 0354   GFRAA >60 03/03/2020 1452    Assessment: 1.  Rectal bleeding: For the past month and a half, likely from fissure, but no recent colonoscopy and  [facility] would like an  updated screening 2.  History of anal fissure: Being treated by [facility]  Plan: 1.  Scheduled patient for a diagnostic colonoscopy in the [place] with Dr. Bryan Lemma as she request this be done before the end of the month for insurance reasons.  Did provide the patient with a detailed list of risks for the procedure and she agrees to proceed.  She will continue to follow with Dr. Bryan Lemma after time procedures her primary GI physician. 2.  Patient to follow in clinic per recommendations from Dr. Bryan Lemma after time of procedure.  Ellouise Newer, PA-C [facility] 04/11/2021, 9:06 AM  Cc: Dwan Bolt, MD

## 2021-04-19 ENCOUNTER — Other Ambulatory Visit: Payer: Self-pay | Admitting: Neurology

## 2021-04-24 ENCOUNTER — Encounter: Payer: Self-pay | Admitting: Gastroenterology

## 2021-04-24 ENCOUNTER — Ambulatory Visit (AMBULATORY_SURGERY_CENTER): Payer: No Typology Code available for payment source | Admitting: Gastroenterology

## 2021-04-24 ENCOUNTER — Other Ambulatory Visit: Payer: Self-pay

## 2021-04-24 VITALS — BP 134/62 | HR 61 | Temp 97.1°F | Resp 14 | Ht 71.0 in | Wt 274.0 lb

## 2021-04-24 DIAGNOSIS — K641 Second degree hemorrhoids: Secondary | ICD-10-CM

## 2021-04-24 DIAGNOSIS — K625 Hemorrhage of anus and rectum: Secondary | ICD-10-CM | POA: Diagnosis not present

## 2021-04-24 DIAGNOSIS — Z8719 Personal history of other diseases of the digestive system: Secondary | ICD-10-CM | POA: Diagnosis not present

## 2021-04-24 MED ORDER — SODIUM CHLORIDE 0.9 % IV SOLN: 500.0000 mL | Freq: Once | INTRAVENOUS | Status: DC

## 2021-04-24 NOTE — Progress Notes (Signed)
Pt instructed to drink clear liquids only until tomorrow at 730 AM, reviewed instructions for tomorrow and d/c instructions with pt, verb understanding.

## 2021-04-24 NOTE — Op Note (Signed)
[facility] Patient Name: Lynn Roberts Procedure Date: 04/24/2021 1:12 PM MRN: [id number] Endoscopist: Gerrit Heck , MD Age: 35 Referring MD:  Date of Birth: 1986-07-30 Gender: Female Account #: [id number] Procedure:                Colonoscopy Indications:              Hematochezia Medicines:                Monitored Anesthesia Care Procedure:                Pre-Anesthesia Assessment:                           - Prior to the procedure, a History and Physical                            was performed, and patient medications and                            allergies were reviewed. The patient's tolerance of                            previous anesthesia was also reviewed. The risks                            and benefits of the procedure and the sedation                            options and risks were discussed with the patient.                            All questions were answered, and informed consent                            was obtained. Prior Anticoagulants: The patient has                            taken no previous anticoagulant or antiplatelet                            agents. ASA Grade Assessment: II - A patient with                            mild systemic disease. After reviewing the risks                            and benefits, the patient was deemed in                            satisfactory condition to undergo the procedure.                           After obtaining informed consent, the colonoscope  was passed under direct vision. Throughout the                            procedure, the patient's blood pressure, pulse, and                            oxygen saturations were monitored continuously. The                            Olympus CF-[id number]) Colonoscope was                            introduced through the anus and advanced to the the                            terminal ileum. The colonoscopy was technically                             difficult and complex due to poor bowel prep. The                            patient tolerated the procedure well. The quality                            of the bowel preparation was fair. The terminal                            ileum, ileocecal valve, appendiceal orifice, and                            rectum were photographed. Scope In: 1:37:12 PM Scope Out: 1:57:40 PM Scope Withdrawal Time: 0 hours 16 minutes 8 seconds  Total Procedure Duration: 0 hours 20 minutes 28 seconds  Findings:                 Hemorrhoids were found on perianal exam. There were                            2 scars from prior surgery.                           A moderate amount of semi-liquid solid stool was                            found in the entire colon, interfering with                            visualization. This was more voluminous in the left                            colon. Lavage of the area was performed using                            copious amounts of tap water, resulting  in                            incomplete clearance with fair visualization.                            Cannot rule out the presence of small or flat                            polyps in these areas.                           The visualized mucosa was otherwise normal                            appearing throughout the colon, albeit on                            significantly limited views in the left and distal                            colon.                           Non-bleeding internal hemorrhoids were found during                            retroflexion. The hemorrhoids were medium-sized.                           The terminal ileum appeared normal. Complications:            No immediate complications. Estimated Blood Loss:     Estimated blood loss: none. Impression:               - Preparation of the colon was fair.                           - Hemorrhoids found on perianal exam.                            - Stool in the entire examined colon.                           - Normal mucosa in the entire examined colon.                           - Non-bleeding internal hemorrhoids.                           - The examined portion of the ileum was normal.                           - No specimens collected. Recommendation:           - Patient has a contact number available for  emergencies. The signs and symptoms of potential                            delayed complications were discussed with the                            patient. Return to normal activities tomorrow.                            Written discharge instructions were provided to the                            patient.                           - Clear liquid diet today and repeat bowel prep                            this evening in anticipation of repeat colonoscopy                            tomorrow prior to any planned surgical intervention.                           - Continue present medications.                           - Repeat colonoscopy tomorrow because the bowel                            preparation was suboptimal. Gerrit Heck, MD 04/24/2021 2:12:14 PM

## 2021-04-24 NOTE — Patient Instructions (Signed)
Instructions given for repeat colonoscopy scheduled tomorrow arrive at 930 AM.   YOU HAD AN ENDOSCOPIC PROCEDURE TODAY AT [facility]:   Refer to the procedure report that was given to you for any specific questions about what was found during the examination.  If the procedure report does not answer your questions, please call your gastroenterologist to clarify.  If you requested that your care partner not be given the details of your procedure findings, then the procedure report has been included in a sealed envelope for you to review at your convenience later.  YOU SHOULD EXPECT: Some feelings of bloating in the abdomen. Passage of more gas than usual.  Walking can help get rid of the air that was put into your GI tract during the procedure and reduce the bloating. If you had a lower endoscopy (such as a colonoscopy or flexible sigmoidoscopy) you may notice spotting of blood in your stool or on the toilet paper. If you underwent a bowel prep for your procedure, you may not have a normal bowel movement for a few days.  Please Note:  You might notice some irritation and congestion in your nose or some drainage.  This is from the oxygen used during your procedure.  There is no need for concern and it should clear up in a day or so.  SYMPTOMS TO REPORT IMMEDIATELY:   Following lower endoscopy (colonoscopy or flexible sigmoidoscopy):  Excessive amounts of blood in the stool  Significant tenderness or worsening of abdominal pains  Swelling of the abdomen that is new, acute  Fever of 100F or higher  For urgent or emergent issues, a gastroenterologist can be reached at any hour by calling [phone]. Do not use MyChart messaging for urgent concerns.    DIET:  We do recommend a small meal at first, but then you may proceed to your regular diet.  Drink plenty of fluids but you should avoid alcoholic beverages for 24 hours.  ACTIVITY:  You should plan to take it easy for the  rest of today and you should NOT DRIVE or use heavy machinery until tomorrow (because of the sedation medicines used during the test).    FOLLOW UP: Our staff will call the number listed on your records 48-72 hours following your procedure to check on you and address any questions or concerns that you may have regarding the information given to you following your procedure. If we do not reach you, we will leave a message.  We will attempt to reach you two times.  During this call, we will ask if you have developed any symptoms of COVID 19. If you develop any symptoms (ie: fever, flu-like symptoms, shortness of breath, cough etc.) before then, please call [phone].  If you test positive for Covid 19 in the 2 weeks post procedure, please call and report this information to [place].    If any biopsies were taken you will be contacted by phone or by letter within the next 1-3 weeks.  Please call us at [phone] if you have not heard about the biopsies in 3 weeks.    SIGNATURES/CONFIDENTIALITY: You and/or your care partner have signed paperwork which will be entered into your electronic medical record.  These signatures attest to the fact that that the information above on your After Visit Summary has been reviewed and is understood.  Full responsibility of the confidentiality of this discharge information lies with you and/or your care-partner.

## 2021-04-24 NOTE — Progress Notes (Signed)
pt tolerated well. VSS. awake and to recovery. Report given to RN.  

## 2021-04-25 ENCOUNTER — Ambulatory Visit (AMBULATORY_SURGERY_CENTER): Payer: No Typology Code available for payment source | Admitting: Gastroenterology

## 2021-04-25 ENCOUNTER — Other Ambulatory Visit: Payer: Self-pay

## 2021-04-25 ENCOUNTER — Encounter: Payer: Self-pay | Admitting: Gastroenterology

## 2021-04-25 VITALS — BP 120/87 | HR 57 | Temp 97.5°F | Resp 18 | Ht 71.0 in | Wt 274.0 lb

## 2021-04-25 DIAGNOSIS — G35 Multiple sclerosis: Secondary | ICD-10-CM | POA: Diagnosis not present

## 2021-04-25 DIAGNOSIS — K625 Hemorrhage of anus and rectum: Secondary | ICD-10-CM

## 2021-04-25 DIAGNOSIS — D125 Benign neoplasm of sigmoid colon: Secondary | ICD-10-CM

## 2021-04-25 DIAGNOSIS — K635 Polyp of colon: Secondary | ICD-10-CM | POA: Diagnosis not present

## 2021-04-25 DIAGNOSIS — K641 Second degree hemorrhoids: Secondary | ICD-10-CM | POA: Diagnosis not present

## 2021-04-25 MED ORDER — SODIUM CHLORIDE 0.9 % IV SOLN: 500.0000 mL | Freq: Once | INTRAVENOUS | Status: DC

## 2021-04-25 NOTE — Progress Notes (Signed)
Called to room to assist during endoscopic procedure.  Patient ID and intended procedure confirmed with present staff. Received instructions for my participation in the procedure from the performing physician.  

## 2021-04-25 NOTE — Progress Notes (Signed)
Pt. Reports no change in her medical or surgical history since her visit yesterday.

## 2021-04-25 NOTE — Op Note (Signed)
[facility] Patient Name: Lynn Roberts Procedure Date: 04/25/2021 10:58 AM MRN: [id number] Endoscopist: Gerrit Heck , MD Age: 35 Referring MD:  Date of Birth: 08/03/1986 Gender: Female Account #: [id number] Procedure:                Colonoscopy Indications:              Hematochezia, Preoperative assessment Medicines:                Monitored Anesthesia Care Procedure:                Pre-Anesthesia Assessment:                           - Prior to the procedure, a History and Physical                            was performed, and patient medications and                            allergies were reviewed. The patient's tolerance of                            previous anesthesia was also reviewed. The risks                            and benefits of the procedure and the sedation                            options and risks were discussed with the patient.                            All questions were answered, and informed consent                            was obtained. Prior Anticoagulants: The patient has                            taken no previous anticoagulant or antiplatelet                            agents. ASA Grade Assessment: II - A patient with                            mild systemic disease. After reviewing the risks                            and benefits, the patient was deemed in                            satisfactory condition to undergo the procedure.                           After obtaining informed consent, the colonoscope  was passed under direct vision. Throughout the                            procedure, the patient's blood pressure, pulse, and                            oxygen saturations were monitored continuously. The                            Olympus CF-HQ190L ([id number]) Colonoscope was                            introduced through the anus and advanced to the the                            terminal ileum. The  colonoscopy was performed                            without difficulty. The patient tolerated the                            procedure well. The quality of the bowel                            preparation was adequate. The terminal ileum,                            ileocecal valve, appendiceal orifice, and rectum                            were photographed. Scope In: 11:14:01 AM Scope Out: 28:31:51 AM Scope Withdrawal Time: 0 hours 24 minutes 23 seconds  Total Procedure Duration: 0 hours 32 minutes 44 seconds  Findings:                 Hemorrhoids were found on perianal exam.                           A 3 mm polyp was found in the sigmoid colon. The                            polyp was sessile. The polyp was removed with a                            cold snare. Resection and retrieval were complete.                            Estimated blood loss was minimal.                           A moderate amount of stool and adherent debris was                            found scattered throughout the colon. There were  areas of fiercely adherent mucus debris,                            particularly in the left colon. Lavage of the area                            was performed using copious amounts of tap water,                            resulting in clearance with adequate visualization.                            While small or flat polyps could have been missed,                            the majority of the mucosal surface was visible and                            considered adequate for the purposes of this study.                           Non-bleeding internal hemorrhoids were found during                            retroflexion. The hemorrhoids were medium-sized.                           The terminal ileum appeared normal. Complications:            No immediate complications. Estimated Blood Loss:     Estimated blood loss was minimal. Impression:                - Hemorrhoids found on perianal exam.                           - One 3 mm polyp in the sigmoid colon, removed with                            a cold snare. Resected and retrieved.                           - Stool in the entire examined colon.                           - Non-bleeding internal hemorrhoids.                           - The examined portion of the ileum was normal. Recommendation:           - Patient has a contact number available for                            emergencies. The signs and symptoms of potential  delayed complications were discussed with the                            patient. Return to normal activities tomorrow.                            Written discharge instructions were provided to the                            patient.                           - Resume previous diet.                           - Continue present medications.                           - Await pathology results.                           - Repeat colonoscopy at age 56 for screening                            purposes.                           - Follow-up with Dr. Dema Severin in the Colorectal                            [facility] Clinic. Gerrit Heck, MD 04/25/2021 11:53:22 AM

## 2021-04-25 NOTE — Progress Notes (Signed)
Report to PACU, RN, vss, BBS= Clear.  

## 2021-04-25 NOTE — Patient Instructions (Signed)
Please read handouts provided. Continue present medications. Await pathology results. Repeat colonoscopy at age 35 years for screening purposes. Follow-up with Dr. Dema Severin in the Colorectal Surgery Clinic.    YOU HAD AN ENDOSCOPIC PROCEDURE TODAY AT [facility]:   Refer to the procedure report that was given to you for any specific questions about what was found during the examination.  If the procedure report does not answer your questions, please call your gastroenterologist to clarify.  If you requested that your care partner not be given the details of your procedure findings, then the procedure report has been included in a sealed envelope for you to review at your convenience later.  YOU SHOULD EXPECT: Some feelings of bloating in the abdomen. Passage of more gas than usual.  Walking can help get rid of the air that was put into your GI tract during the procedure and reduce the bloating. If you had a lower endoscopy (such as a colonoscopy or flexible sigmoidoscopy) you may notice spotting of blood in your stool or on the toilet paper. If you underwent a bowel prep for your procedure, you may not have a normal bowel movement for a few days.  Please Note:  You might notice some irritation and congestion in your nose or some drainage.  This is from the oxygen used during your procedure.  There is no need for concern and it should clear up in a day or so.  SYMPTOMS TO REPORT IMMEDIATELY:   Following lower endoscopy (colonoscopy or flexible sigmoidoscopy):  Excessive amounts of blood in the stool  Significant tenderness or worsening of abdominal pains  Swelling of the abdomen that is new, acute  Fever of 100F or higher   For urgent or emergent issues, a gastroenterologist can be reached at any hour by calling [phone]. Do not use MyChart messaging for urgent concerns.    DIET:  We do recommend a small meal at first, but then you may proceed to your regular diet.   Drink plenty of fluids but you should avoid alcoholic beverages for 24 hours.  ACTIVITY:  You should plan to take it easy for the rest of today and you should NOT DRIVE or use heavy machinery until tomorrow (because of the sedation medicines used during the test).    FOLLOW UP: Our staff will call the number listed on your records 48-72 hours following your procedure to check on you and address any questions or concerns that you may have regarding the information given to you following your procedure. If we do not reach you, we will leave a message.  We will attempt to reach you two times.  During this call, we will ask if you have developed any symptoms of COVID 19. If you develop any symptoms (ie: fever, flu-like symptoms, shortness of breath, cough etc.) before then, please call [phone].  If you test positive for Covid 19 in the 2 weeks post procedure, please call and report this information to [place].    If any biopsies were taken you will be contacted by phone or by letter within the next 1-3 weeks.  Please call us at [phone] if you have not heard about the biopsies in 3 weeks.    SIGNATURES/CONFIDENTIALITY: You and/or your care partner have signed paperwork which will be entered into your electronic medical record.  These signatures attest to the fact that that the information above on your After Visit Summary has been reviewed and is understood.  Full responsibility  of the confidentiality of this discharge information lies with you and/or your care-partner.

## 2021-04-27 ENCOUNTER — Telehealth: Payer: Self-pay

## 2021-04-27 NOTE — Telephone Encounter (Signed)
  Follow up Call-  Call back number 04/25/2021 04/24/2021  Post procedure Call Back phone  # [phone]  Permission to leave phone message Yes Yes  Some recent data might be hidden     Patient questions:  Do you have a fever, pain , or abdominal swelling? No. Pain Score  0 *  Have you tolerated food without any problems? Yes.    Have you been able to return to your normal activities? Yes.    Do you have any questions about your discharge instructions: Diet   No. Medications  No. Follow up visit  No.  Do you have questions or concerns about your Care? No.  Actions: * If pain score is 4 or above: No action needed, pain <4.  1. Have you developed a fever since your procedure? no  2.   Have you had an respiratory symptoms (SOB or cough) since your procedure? no  3.   Have you tested positive for COVID 19 since your procedure no  4.   Have you had any family members/close contacts diagnosed with the COVID 19 since your procedure?  no   If yes to any of these questions please route to Joylene John, RN and Joella Prince, RN

## 2021-05-04 ENCOUNTER — Encounter: Payer: Self-pay | Admitting: Gastroenterology

## 2021-05-15 ENCOUNTER — Other Ambulatory Visit: Payer: Self-pay

## 2021-05-15 ENCOUNTER — Encounter: Payer: Self-pay | Admitting: Neurology

## 2021-05-15 ENCOUNTER — Ambulatory Visit (INDEPENDENT_AMBULATORY_CARE_PROVIDER_SITE_OTHER): Payer: No Typology Code available for payment source | Admitting: Neurology

## 2021-05-15 VITALS — BP 131/83 | HR 76 | Ht 71.0 in | Wt 273.0 lb

## 2021-05-15 DIAGNOSIS — Z79899 Other long term (current) drug therapy: Secondary | ICD-10-CM

## 2021-05-15 DIAGNOSIS — E559 Vitamin D deficiency, unspecified: Secondary | ICD-10-CM

## 2021-05-15 DIAGNOSIS — E538 Deficiency of other specified B group vitamins: Secondary | ICD-10-CM | POA: Insufficient documentation

## 2021-05-15 DIAGNOSIS — G43109 Migraine with aura, not intractable, without status migrainosus: Secondary | ICD-10-CM

## 2021-05-15 DIAGNOSIS — Z9884 Bariatric surgery status: Secondary | ICD-10-CM

## 2021-05-15 DIAGNOSIS — G35 Multiple sclerosis: Secondary | ICD-10-CM

## 2021-05-15 DIAGNOSIS — E610 Copper deficiency: Secondary | ICD-10-CM

## 2021-05-15 MED ORDER — KETOROLAC TROMETHAMINE 60 MG/2ML IM SOLN
60.0000 mg | Freq: Once | INTRAMUSCULAR | Status: AC
  Administered 2021-05-15: 60 mg via INTRAMUSCULAR

## 2021-05-15 NOTE — Telephone Encounter (Signed)
Called and spoke w/ pt. Offered work in appt today at [employer]  w/ Dr. Felecia Shelling. She is going to see if she could leave work to come in, she will call right back.

## 2021-05-15 NOTE — Progress Notes (Signed)
[facility]  PATIENT: Lynn Roberts DOB: [birth year]  REFERRING DOCTOR OR PCP:  Dr. Tama High (Ophthalmology); Amie Portland, MD (Neuro-hospitalist); Dionisio David (PCP) SOURCE:   _________________________________   HISTORICAL  CHIEF COMPLAINT:  Chief Complaint  Patient presents with   Follow-up    RM 10, alone. Last seen 03/07/21. On Zeposia for MS. Reports: "Fireflies" in my eyes. For last couple days, I have had some worsening back pain and my legs feels like they are throbbing/tired." She has felt "off" since the weekend. She is unable to cut copper in half being a capsule and there is no 1mg  formulation available.    HISTORY OF PRESENT ILLNESS:  Lynn Roberts, is a 35 y.o. woman with relapsing remitting multiple sclerosis.   UPDATE 05/15/2021: She is on Zeposia and tolerates it well.  She started 6 weeks ago. Yesterday, while in the shower, she had firefly like changes in her vision lasting a few minutes at a time.  She had 3 spells like that, one after another.   A headache started on her left shortly afterwards.   It is still present though milder.  Maybe a little more nausea (has some at baseline).    She did not have photophobia and moving her head had no influence on the HA.   She does not get migraines in general  She has had more leg pain and back pain since yesterday as well.  Currently, vision is reduced OS with desaturated color vision.  She notes nausea, helped by ondansetron.      She denies numbness, weakness or gait issues.  She does have a sensation in her legs like they are tired.   She has sone urinary frequency.       Her copper was low.   She gets some nausea with the 2 mg copper capsules  MS History: She presented to [facility] 02/22/2021 after she had the onset of left eye pain, headache and complete loss of vision OS 02/21/2021.  She saw Dr. Brigitte Pulse (ophthalmology) who advised her to go to the ED.    She had studies and was  admitted and received 5 days of IV steroids.  MRI of the orbits confirmed left optic neuritis.  MRI of the brain showed multiple foci consistent with MS.  2 foci in the left hemisphere enhanced after contrast.  MRI of the spine did not show any additional lesions though there was extensive movement artifact that limited the study.   She had no other neurologic episode in the past.   She began to improve a day after the IV Solu-Medrol was initiated and was much better by discharge.      Other Medical:  She was > 650 pounds and had bariatric surgery (duodenal switch), losing > 400 pounds in 2015.   She had an anal fissure last year requiring surgery and still has some mild bleeding.   She has seen surgery and they have requested a colonoscopy.     She smokes and is trying to quit (using patches).   She has two kids ages 31 and 71.       Imaging: MRI of the brain 02/22/2021 shows multiple T2/FLAIR hyperintense foci in the hemispheres in a pattern consistent with multiple sclerosis.  2 foci on the left show subtle enhancement after contrast.  MRI of the orbits 02/22/2021 shows enhancement of the left optic nerve.  MRI of the cervical spine 02/23/2021 limited by movement artifact shows a normal spinal  cord and mild multilevel degenerative changes.  MRI of the thoracic spine 02/23/2021 limited by movement artifact shows a normal spinal cord and degenerative changes with mild spinal stenosis at T11-T12 and T12-L1.   Laboratory tests March 2022:   Anti-NMO and anti-MOG are negative.  B12 low at 152 (180-914).   EKG normal 2/.2021  REVIEW OF SYSTEMS: Constitutional: No fevers, chills, sweats, or change in appetite Eyes: No visual changes, double vision, eye pain Ear, nose and throat: No hearing loss, ear pain, nasal congestion, sore throat Cardiovascular: No chest pain, palpitations Respiratory:  No shortness of breath at rest or with exertion.   No wheezes GastrointestinaI: No nausea, vomiting, diarrhea,  abdominal pain, fecal incontinence Genitourinary:  No dysuria, urinary retention or frequency.  No nocturia. Musculoskeletal:  No neck pain, back pain Integumentary: No rash, pruritus, skin lesions Neurological: as above Psychiatric: No depression at this time.  No anxiety Endocrine: No palpitations, diaphoresis, change in appetite, change in weigh or increased thirst Hematologic/Lymphatic:  No anemia, purpura, petechiae. Allergic/Immunologic: No itchy/runny eyes, nasal congestion, recent allergic reactions, rashes  ALLERGIES: Allergies  Allergen Reactions   Nsaids Other (See Comments)    HX. OF WEIGHT-LOSS SURGERY    Other Rash    Reaction to LOTIONS    Sulfa Antibiotics Rash   Tramadol Rash    Had after 2014 and tolerated okay    HOME MEDICATIONS:  Current Outpatient Medications:    Copper Gluconate (COPPER CAPS) 2 MG CAPS, Take 1 capsule by mouth daily., Disp: , Rfl:    cyanocobalamin (,VITAMIN B-12,) 1000 MCG/ML injection, 1 cc subcut every 4 weeks.   Dispense with four 1 cc syringe with 25 g needle (Patient taking differently: 1 cc subcut every 4 weeks.   Dispense with four 1 cc syringe with 25 g needle), Disp: 4 mL, Rfl: 1   Multiple Vitamin (MULTIVITAMIN WITH MINERALS) TABS tablet, Take 1 tablet by mouth daily., Disp: 30 tablet, Rfl: 0   ondansetron (ZOFRAN) 4 MG tablet, Take 1 tablet (4 mg total) by mouth daily as needed for nausea or vomiting., Disp: 30 tablet, Rfl: 1   Ozanimod HCl (ZEPOSIA) 0.92 MG CAPS, Take by mouth., Disp: , Rfl:    Vitamin D, Ergocalciferol, (DRISDOL) 1.25 MG (50000 UNIT) CAPS capsule, Take 1 capsule (50,000 Units total) by mouth every 7 (seven) days., Disp: 13 capsule, Rfl: 1  PAST MEDICAL HISTORY: Past Medical History:  Diagnosis Date   Arthritis of low back    Complication of anesthesia    is of [name] descent   Family history of adverse reaction to anesthesia    pt is a Lumbi [name]   GERD (gastroesophageal reflux disease)    none since  pregnancy    Heart murmur    When pt. was pregnant with her last child, and also had murmur as a child.     Hemorrhoids    History of anal fissures    History of PAT (paroxysmal atrial tachycardia)    had cardiac ablation as a teenager   History of sleep apnea    none since 450 pound weight loss in 2015   Migraines    MS (multiple sclerosis) (HCC)    PCOD (polycystic ovarian disease)    no menses    PAST SURGICAL HISTORY: Past Surgical History:  Procedure Laterality Date   BREAST LUMPECTOMY Left 2011   papilloma   BREAST LUMPECTOMY Left 2015   papilloma   BREAST LUMPECTOMY WITH RADIOACTIVE SEED LOCALIZATION Left 10/18/2014  Procedure:  RADIOACTIVE SEED GUIDED EXCISIONAL BREAST BIOPSY;  Surgeon: Alphonsa Overall, MD;  Location: [facility];  Service: General;  Laterality: Left;   BREAST SURGERY Right 07/27/2004   retro-areolar dissection with exc. of large duct   CARDIAC ELECTROPHYSIOLOGY MAPPING AND ABLATION  [age]ge 13   CARDIAC ELECTROPHYSIOLOGY STUDY AND ABLATION  age 50   CESAREAN SECTION N/A 03/28/2020   Procedure: CESAREAN SECTION;  Surgeon: Deliah Boston, MD;  Location: [facility];  Service: Obstetrics;  Laterality: N/A;  RNFA [place]   CESAREAN SECTION  03/2018   CHOLECYSTECTOMY  04/27/2008   GASTROPLASTY DUODENAL SWITCH  01/2014   done at [place] N/A 08/15/2020   Procedure: ANORECTAL EXAM UNDER ANESTHESIA INCISION AND DRAINAGE OF PERIRECTAL ABSCESS x2 WITH PLACEMENT OF SETON;  Surgeon: Ileana Roup, MD;  Location: [facility];  Service: General;  Laterality: N/A;   OVARIAN CYST REMOVAL Bilateral 1999   PLACEMENT OF SETON  08/15/2020   PLACEMENT OF SETON N/A 12/08/2020   Procedure: PLACEMENT OF CUTTING SETON; PARTIAL FISTULOTOMY;  Surgeon: Ileana Roup, MD;  Location: [facility];  Service: General;  Laterality: N/A;   RECTAL EXAM UNDER ANESTHESIA N/A 12/08/2020   Procedure: ANORECTAL EXAM UNDER  ANESTHESIA;  Surgeon: Ileana Roup, MD;  Location: [facility];  Service: General;  Laterality: N/A;   TOENAIL EXCISION  yrs ago   UNILATERAL SALPINGECTOMY      FAMILY HISTORY: Family History  Problem Relation Age of Onset   Cancer Paternal Grandfather        pancreactic   Cancer Maternal Grandmother        multiple myaloma, breast cancer   Heart disease Maternal Grandmother    Diabetes Maternal Grandfather    Hypertension Father    Hyperlipidemia Father    Hypertension Mother    Diabetes Mother    Colon cancer Neg Hx    Stomach cancer Neg Hx    Rectal cancer Neg Hx    Esophageal cancer Neg Hx     SOCIAL HISTORY:  Social History   Socioeconomic History   Marital status: Married    Spouse name: Quillian Quince   Number of children: 2   Years of education: 12   Highest education level: Not on file  Occupational History   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   Caffeine use: 1 cup per day of tea   Social Determinants of Health   Financial Resource Strain: Not on file  Food Insecurity: Not on file  Transportation Needs: Not on file  Physical Activity: Not on file  Stress: Not on file  Social Connections: Not on file  Intimate Partner Violence: Not on file     PHYSICAL EXAM  Vitals:   05/15/21 1057  BP: 131/83  Pulse: 76  Weight: 273 lb (123.8 kg)  Height: 5\' 11"  (1.803 m)    Body mass index is 38.08 kg/m.   Vision Screening   Right eye Left eye Both eyes   Without correction 20/50 20/40 20/20   With correction        General: The patient is well-developed and well-nourished and in no acute distress  HEENT:  Head is [place]/AT.  Sclera are anicteric.  Funduscopic exam shows normal optic discs and retinal vessels.  Skin: Extremities are without rash or edema.  Neurologic Exam  Mental status: The patient is alert and oriented x 3 at the time of the examination. The patient has apparent normal recent and remote memory, with an apparently normal attention span and concentration ability.   Speech is normal.  Cranial nerves: Extraocular movements are full. Reduced color vision and acuity OS.  2+ APD on left.   Facial symmetry is present. There is good facial sensation to soft touch bilaterally.Facial strength is normal.  No obvious hearing deficits are noted.  Motor:  Muscle bulk is normal.   Tone is normal. Strength is  5 / 5 in all 4 extremities.   Sensory: Sensory testing is intact to pinprick, soft touch and vibration sensation in all 4 extremities.  Coordination: Cerebellar testing reveals good finger-nose-finger and heel-to-shin bilaterally.  Gait and station: Station is normal.   Gait is normal. Tandem gait is normal. . Romberg is negative.   Reflexes: Deep tendon reflexes are symmetric and normal bilaterally.   Plantar responses are flexor.    DIAGNOSTIC DATA (LABS, IMAGING, TESTING) - I reviewed patient records, labs, notes, testing and imaging myself where available.  Lab Results  Component Value Date   WBC 5.4 02/26/2021   HGB 9.4 (L) 02/26/2021   HCT 31.0 (L) 02/26/2021   MCV 82.2 02/26/2021   PLT 179 02/26/2021      Component Value Date/Time   NA 138 02/26/2021 0325   NA 136 10/12/2019 1030   K 3.4 (L) 02/26/2021 0325   CL 108 02/26/2021 0325   CO2 23 02/26/2021 0325   GLUCOSE 142 (H) 02/26/2021 0325   BUN 9 02/26/2021 0325   BUN 8 10/12/2019 1030   CREATININE 0.52 02/26/2021 0325   CREATININE 0.62 03/03/2020 1452    CALCIUM 8.0 (L) 02/26/2021 0325   PROT 5.7 (L) 02/26/2021 0325   ALBUMIN 2.9 (L) 02/26/2021 0325   AST 31 02/26/2021 0325   AST 10 (L) 03/03/2020 1452   ALT 23 02/26/2021 0325   ALT 10 03/03/2020 1452   ALKPHOS 42 02/26/2021 0325   BILITOT 0.6 02/26/2021 0325   BILITOT 0.4 03/03/2020 1452   GFRNONAA >60 02/26/2021 0325   GFRNONAA >60 03/03/2020 1452   GFRAA >60 08/15/2020 0354   GFRAA >60 03/03/2020 1452   No results found for: CHOL, HDL, LDLCALC, LDLDIRECT, TRIG, CHOLHDL Lab Results  Component Value Date   HGBA1C 4.6 (L) 11/05/2017   Lab Results  Component Value Date   VITAMINB12 152 (L) 02/23/2021   Lab Results  Component Value Date   TSH 1.380 10/12/2019       ASSESSMENT AND PLAN  Multiple sclerosis ([place])  High risk medication use  Vitamin D deficiency  S/P bariatric surgery  Copper deficiency  B12 deficiency\ \ Continue Zeposia (has f/u in July and we wil check labs then) Toradol 60 mg IM for HA COntinue Vit D, Vit B12 (shots) and swithch copper to tablet to cut in half.   Rtc as scheduled or call  sooner if ne or worsening issues.  [place] Mcgrory A. Felecia Shelling, MD, [facility] 2/75/1700, 17:49 AM Certified in Neurology, Clinical Neurophysiology, Sleep Medicine and Neuroimaging  [facility] [address] [place], [place] [address] [phone]

## 2021-05-15 NOTE — Progress Notes (Signed)
Gave Toradol 60mg /35ml IM in right deltoid. Cleaned with alcohol wipe prior to injection. Band-aid applied. Pt tolerated well.

## 2021-06-12 ENCOUNTER — Other Ambulatory Visit: Payer: Self-pay | Admitting: Neurology

## 2021-06-20 ENCOUNTER — Encounter: Payer: Self-pay | Admitting: Hematology and Oncology

## 2021-06-20 ENCOUNTER — Encounter: Payer: Self-pay | Admitting: Neurology

## 2021-06-20 ENCOUNTER — Ambulatory Visit: Payer: 59 | Admitting: Neurology

## 2021-06-20 VITALS — BP 144/87 | HR 62 | Ht 71.0 in | Wt 271.0 lb

## 2021-06-20 DIAGNOSIS — E538 Deficiency of other specified B group vitamins: Secondary | ICD-10-CM

## 2021-06-20 DIAGNOSIS — G35 Multiple sclerosis: Secondary | ICD-10-CM

## 2021-06-20 DIAGNOSIS — E610 Copper deficiency: Secondary | ICD-10-CM

## 2021-06-20 DIAGNOSIS — G43109 Migraine with aura, not intractable, without status migrainosus: Secondary | ICD-10-CM

## 2021-06-20 DIAGNOSIS — Z9884 Bariatric surgery status: Secondary | ICD-10-CM

## 2021-06-20 DIAGNOSIS — Z79899 Other long term (current) drug therapy: Secondary | ICD-10-CM

## 2021-06-20 DIAGNOSIS — E559 Vitamin D deficiency, unspecified: Secondary | ICD-10-CM

## 2021-06-20 MED ORDER — TOLTERODINE TARTRATE ER 4 MG PO CP24: 4.0000 mg | ORAL_CAPSULE | Freq: Every day | ORAL | 5 refills | Status: DC

## 2021-06-20 NOTE — Progress Notes (Signed)
[facility]  PATIENT: Lynn Roberts DOB: September 09, 1986  REFERRING DOCTOR OR PCP:  Dr. Tama High (Ophthalmology); Amie Portland, MD (Neuro-hospitalist); Dionisio David (PCP) SOURCE:   _________________________________   HISTORICAL  CHIEF COMPLAINT:  Chief Complaint  Patient presents with   Follow-up    Pt alone, rm 2. Pt following up. States everything is going well and no concerns. Pt is on Zeposia    HISTORY OF PRESENT ILLNESS:  Lynn Roberts, is a 35 y.o. woman with relapsing remitting multiple sclerosis.   UPDATE 05/15/2021: She is on Zeposia and tolerates it well.    She feels her gait is stable with mildly reduced balance.   She uses the bannister on stairs.   She feels strength is doing well.   No muscle spasms.   She denies tingling or numbness.    Legs are less sore than last visit.  Vision is better but still has mild reduced OS acuity and color vision.   She has no more firefly floaters.     She has urinary frequency and rare urge incontinence.    Headaches have resolved.   She has had more leg pain and back pain since yesterday as well.  She denies much fatigue.  She is sleeping well most nights.  No significant issues with mood currently.  Cognition is doing well.   Her copper and B12 were  low and she takes supplements.  Of note, she had gastric bypass in the past.     MS History: She presented to [facility] 02/22/2021 after she had the onset of left eye pain, headache and complete loss of vision OS 02/21/2021.  She saw Dr. Brigitte Pulse (ophthalmology) who advised her to go to the ED.    She had studies and was admitted and received 5 days of IV steroids.  MRI of the orbits confirmed left optic neuritis.  MRI of the brain showed multiple foci consistent with MS.  2 foci in the left hemisphere enhanced after contrast.  MRI of the spine did not show any additional lesions though there was extensive movement artifact that limited the study.   She had no  other neurologic episode in the past.   She began to improve a day after the IV Solu-Medrol was initiated and was much better by discharge.      Other Medical:  She was > 650 pounds and had bariatric surgery (duodenal switch), losing > 400 pounds in 2015.   She had an anal fissure last year requiring surgery and still has some mild bleeding.   She has seen surgery and they have requested a colonoscopy.     She smokes and is trying to quit (using patches).   She has two kids ages 74 and 60.       Imaging: MRI of the brain 02/22/2021 shows multiple T2/FLAIR hyperintense foci in the hemispheres in a pattern consistent with multiple sclerosis.  2 foci on the left show subtle enhancement after contrast.  MRI of the orbits 02/22/2021 shows enhancement of the left optic nerve.  MRI of the cervical spine 02/23/2021 limited by movement artifact shows a normal spinal cord and mild multilevel degenerative changes.  MRI of the thoracic spine 02/23/2021 limited by movement artifact shows a normal spinal cord and degenerative changes with mild spinal stenosis at T11-T12 and T12-L1.   Laboratory tests March 2022:   Anti-NMO and anti-MOG are negative.  B12 low at 152 (180-914).   EKG normal 2/.2021  REVIEW OF  SYSTEMS: Constitutional: No fevers, chills, sweats, or change in appetite Eyes: No visual changes, double vision, eye pain Ear, nose and throat: No hearing loss, ear pain, nasal congestion, sore throat Cardiovascular: No chest pain, palpitations Respiratory:  No shortness of breath at rest or with exertion.   No wheezes GastrointestinaI: No nausea, vomiting, diarrhea, abdominal pain, fecal incontinence Genitourinary:  No dysuria, urinary retention or frequency.  No nocturia. Musculoskeletal:  No neck pain, back pain Integumentary: No rash, pruritus, skin lesions Neurological: as above Psychiatric: No depression at this time.  No anxiety Endocrine: No palpitations, diaphoresis, change in appetite,  change in weigh or increased thirst Hematologic/Lymphatic:  No anemia, purpura, petechiae. Allergic/Immunologic: No itchy/runny eyes, nasal congestion, recent allergic reactions, rashes  ALLERGIES: Allergies  Allergen Reactions   Nsaids Other (See Comments)    HX. OF WEIGHT-LOSS SURGERY    Other Rash    Reaction to LOTIONS    Sulfa Antibiotics Rash   Tramadol Rash    Had after 2014 and tolerated okay    HOME MEDICATIONS:  Current Outpatient Medications:    Copper Gluconate (COPPER CAPS) 2 MG CAPS, Take 1 capsule by mouth daily., Disp: , Rfl:    cyanocobalamin (,VITAMIN B-12,) 1000 MCG/ML injection, 1 cc subcut every 4 weeks.   Dispense with four 1 cc syringe with 25 g needle (Patient taking differently: 1 cc subcut every 4 weeks.   Dispense with four 1 cc syringe with 25 g needle), Disp: 4 mL, Rfl: 1   Multiple Vitamin (MULTIVITAMIN WITH MINERALS) TABS tablet, Take 1 tablet by mouth daily., Disp: 30 tablet, Rfl: 0   ondansetron (ZOFRAN) 4 MG tablet, Take 1 tablet (4 mg total) by mouth as needed for nausea or vomiting., Disp: 30 tablet, Rfl: 3   Ozanimod HCl (ZEPOSIA) 0.92 MG CAPS, Take by mouth., Disp: , Rfl:    Vitamin D, Ergocalciferol, (DRISDOL) 1.25 MG (50000 UNIT) CAPS capsule, Take 1 capsule (50,000 Units total) by mouth every 7 (seven) days., Disp: 13 capsule, Rfl: 1  PAST MEDICAL HISTORY: Past Medical History:  Diagnosis Date   Arthritis of low back    Complication of anesthesia    is of [name] descent   Family history of adverse reaction to anesthesia    pt is a Lumbi [name]   GERD (gastroesophageal reflux disease)    none since pregnancy    Heart murmur    When pt. was pregnant with her last child, and also had murmur as a child.     Hemorrhoids    History of anal fissures    History of PAT (paroxysmal atrial tachycardia)    had cardiac ablation as a teenager   History of sleep apnea    none since 450 pound weight loss in 2015   Migraines    MS (multiple  sclerosis) (HCC)    PCOD (polycystic ovarian disease)    no menses    PAST SURGICAL HISTORY: Past Surgical History:  Procedure Laterality Date   BREAST LUMPECTOMY Left 2011   papilloma   BREAST LUMPECTOMY Left 2015   papilloma   BREAST LUMPECTOMY WITH RADIOACTIVE SEED LOCALIZATION Left 10/18/2014   Procedure:  RADIOACTIVE SEED GUIDED EXCISIONAL BREAST BIOPSY;  Surgeon: Alphonsa Overall, MD;  Location: [facility];  Service: General;  Laterality: Left;   BREAST SURGERY Right 07/27/2004   retro-areolar dissection with exc. of large duct   CARDIAC ELECTROPHYSIOLOGY MAPPING AND ABLATION  sge 13   CARDIAC ELECTROPHYSIOLOGY STUDY AND ABLATION  age 89  CESAREAN SECTION N/A 03/28/2020   Procedure: CESAREAN SECTION;  Surgeon: Deliah Boston, MD;  Location: [facility];  Service: Obstetrics;  Laterality: N/A;  RNFA [place]   CESAREAN SECTION  03/2018   CHOLECYSTECTOMY  04/27/2008   GASTROPLASTY DUODENAL SWITCH  01/2014   done at [place] N/A 08/15/2020   Procedure: ANORECTAL EXAM UNDER ANESTHESIA INCISION AND DRAINAGE OF PERIRECTAL ABSCESS x2 WITH PLACEMENT OF SETON;  Surgeon: Ileana Roup, MD;  Location: [facility];  Service: General;  Laterality: N/A;   OVARIAN CYST REMOVAL Bilateral 1999   PLACEMENT OF SETON  08/15/2020   PLACEMENT OF SETON N/A 12/08/2020   Procedure: PLACEMENT OF CUTTING SETON; PARTIAL FISTULOTOMY;  Surgeon: Ileana Roup, MD;  Location: [facility];  Service: General;  Laterality: N/A;   RECTAL EXAM UNDER ANESTHESIA N/A 12/08/2020   Procedure: ANORECTAL EXAM UNDER ANESTHESIA;  Surgeon: Ileana Roup, MD;  Location: [facility];  Service: General;  Laterality: N/A;   TOENAIL EXCISION  yrs ago   UNILATERAL SALPINGECTOMY      FAMILY HISTORY: Family History  Problem Relation Age of Onset   Cancer Paternal Grandfather        pancreactic   Cancer Maternal Grandmother         multiple myaloma, breast cancer   Heart disease Maternal Grandmother    Diabetes Maternal Grandfather    Hypertension Father    Hyperlipidemia Father    Hypertension Mother    Diabetes Mother    Colon cancer Neg Hx    Stomach cancer Neg Hx    Rectal cancer Neg Hx    Esophageal cancer Neg Hx     SOCIAL HISTORY:  Social History   Socioeconomic History   Marital status: Married    Spouse name: Quillian Quince   Number of children: 2   Years of education: 12   Highest education level: Not on file  Occupational History   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   Caffeine use: 1 cup per day of tea   Social Determinants of Health   Financial Resource Strain: Not on file  Food Insecurity: Not on file  Transportation Needs: Not on file  Physical Activity: Not on file  Stress: Not on file  Social Connections: Not on file  Intimate Partner Violence: Not on file     PHYSICAL EXAM  Vitals:   06/20/21 1429  BP: (!) 144/87  Pulse: 62  Weight: 271 lb (122.9 kg)  Height: 5\' 11"  (1.803 m)    Body mass index is 37.8 kg/m.   No results found.    General: The patient is well-developed and well-nourished and in no acute distress  HEENT:  Head is /AT.  Sclera are anicteric.  Funduscopic exam shows normal optic discs and retinal vessels.  Skin: Extremities are without rash or edema.  Neurologic Exam  Mental status: The patient is alert and oriented x 3 at the time of the examination. The patient  has apparent normal recent and remote memory, with an apparently normal attention span and concentration ability.   Speech is normal.  Cranial nerves: Extraocular movements are full.  Color vision and visual acuity are reduced on the left.     2+ APD on left.   Facial symmetry is present. There is good facial sensation to soft touch bilaterally.Facial strength is normal.  No obvious hearing deficits are noted.  Motor:  Muscle bulk is normal.   Tone is normal. Strength is  5 / 5 in all 4 extremities.   Sensory: Sensory testing is intact to pinprick, soft touch and vibration sensation in all 4 extremities.  Coordination: Cerebellar testing reveals good finger-nose-finger and heel-to-shin bilaterally.  Gait and station: Station is normal.   Gait is normal.  Tandem gait is normal.  Romberg is negative.  Reflexes: Deep tendon reflexes are symmetric and normal bilaterally.      DIAGNOSTIC DATA (LABS, IMAGING, TESTING) - I reviewed patient records, labs, notes, testing and imaging myself where available.  Lab Results  Component Value Date   WBC 5.4 02/26/2021   HGB 9.4 (L) 02/26/2021   HCT 31.0 (L) 02/26/2021   MCV 82.2 02/26/2021   PLT 179 02/26/2021      Component Value Date/Time   NA 138 02/26/2021 0325   NA 136 10/12/2019 1030   K 3.4 (L) 02/26/2021 0325   CL 108 02/26/2021 0325   CO2 23 02/26/2021 0325   GLUCOSE 142 (H) 02/26/2021 0325   BUN 9 02/26/2021 0325   BUN 8 10/12/2019 1030   CREATININE 0.52 02/26/2021 0325   CREATININE 0.62 03/03/2020 1452   CALCIUM 8.0 (L) 02/26/2021 0325   PROT 5.7 (L) 02/26/2021 0325   ALBUMIN 2.9 (L) 02/26/2021 0325   AST 31 02/26/2021 0325   AST 10 (L) 03/03/2020 1452   ALT 23 02/26/2021 0325   ALT 10 03/03/2020 1452   ALKPHOS 42 02/26/2021 0325   BILITOT 0.6 02/26/2021 0325   BILITOT 0.4 03/03/2020 1452   GFRNONAA >60 02/26/2021 0325   GFRNONAA >60 03/03/2020 1452   GFRAA >60 08/15/2020 0354   GFRAA >60 03/03/2020 1452   No results  found for: CHOL, HDL, LDLCALC, LDLDIRECT, TRIG, CHOLHDL Lab Results  Component Value Date   HGBA1C 4.6 (L) 11/05/2017   Lab Results  Component Value Date   VITAMINB12 152 (L) 02/23/2021   Lab Results  Component Value Date   TSH 1.380 10/12/2019       ASSESSMENT AND PLAN  Multiple sclerosis ([place])  High risk medication use  Vitamin D deficiency  Copper deficiency  B12 deficiency  S/P bariatric surgery  Migraine with aura and without status migrainosus, not intractable   Continue Zeposia and check labs. Stay active and exercise as tolerated.   Continue Vit D, Vit B12 (shots) and swithch copper to tablet to cut in half.   Rtc 6 months or call sooner if ne or worsening issues.  Goldye Tourangeau A. Felecia Shelling, MD, [facility] 01/13/2481, 5:00 PM Certified in Neurology, Clinical Neurophysiology, Sleep Medicine and Neuroimaging  [facility] [address], [place], [place] [address] [phone]

## 2021-06-21 ENCOUNTER — Other Ambulatory Visit: Payer: Self-pay | Admitting: Neurology

## 2021-06-21 ENCOUNTER — Encounter: Payer: Self-pay | Admitting: Neurology

## 2021-06-21 LAB — COMPREHENSIVE METABOLIC PANEL
ALT: 11 IU/L (ref 0–32)
AST: 16 IU/L (ref 0–40)
Albumin/Globulin Ratio: 1.6 (ref 1.2–2.2)
Albumin: 4.1 g/dL (ref 3.8–4.8)
Alkaline Phosphatase: 54 IU/L (ref 44–121)
BUN/Creatinine Ratio: 17 (ref 9–23)
BUN: 9 mg/dL (ref 6–20)
Bilirubin Total: 0.4 mg/dL (ref 0.0–1.2)
CO2: 24 mmol/L (ref 20–29)
Calcium: 8.6 mg/dL — ABNORMAL LOW (ref 8.7–10.2)
Chloride: 108 mmol/L — ABNORMAL HIGH (ref 96–106)
Creatinine, Ser: 0.53 mg/dL — ABNORMAL LOW (ref 0.57–1.00)
Globulin, Total: 2.5 g/dL (ref 1.5–4.5)
Glucose: 77 mg/dL (ref 65–99)
Potassium: 4.4 mmol/L (ref 3.5–5.2)
Sodium: 143 mmol/L (ref 134–144)
Total Protein: 6.6 g/dL (ref 6.0–8.5)
eGFR: 122 mL/min/{1.73_m2} (ref >59)

## 2021-06-21 LAB — CBC WITH DIFFERENTIAL/PLATELET
Basophils Absolute: 0 10*3/uL (ref 0.0–0.2)
Basos: 1 %
EOS (ABSOLUTE): 0.1 10*3/uL (ref 0.0–0.4)
Eos: 2 %
Hematocrit: 34.5 % (ref 34.0–46.6)
Hemoglobin: 10.8 g/dL — ABNORMAL LOW (ref 11.1–15.9)
Immature Grans (Abs): 0 10*3/uL (ref 0.0–0.1)
Immature Granulocytes: 0 %
Lymphocytes Absolute: 1.3 10*3/uL (ref 0.7–3.1)
Lymphs: 23 %
MCH: 24.3 pg — ABNORMAL LOW (ref 26.6–33.0)
MCHC: 31.3 g/dL — ABNORMAL LOW (ref 31.5–35.7)
MCV: 78 fL — ABNORMAL LOW (ref 79–97)
Monocytes Absolute: 0.5 10*3/uL (ref 0.1–0.9)
Monocytes: 9 %
Neutrophils Absolute: 3.8 10*3/uL (ref 1.4–7.0)
Neutrophils: 65 %
Platelets: 226 10*3/uL (ref 150–450)
RBC: 4.44 x10E6/uL (ref 3.77–5.28)
RDW: 16.6 % — ABNORMAL HIGH (ref 11.7–15.4)
WBC: 5.7 10*3/uL (ref 3.4–10.8)

## 2021-06-21 MED ORDER — OXYBUTYNIN CHLORIDE 5 MG PO TABS: 5.0000 mg | ORAL_TABLET | Freq: Two times a day (BID) | ORAL | 1 refills | Status: DC

## 2021-07-02 ENCOUNTER — Telehealth: Payer: Self-pay | Admitting: *Deleted

## 2021-07-02 NOTE — Telephone Encounter (Signed)
PA approved 07/02/21-07/02/22. PA# Standard Opt Out with [facility] [id number].

## 2021-07-02 NOTE — Telephone Encounter (Signed)
Submitted PA Zeposia via fax to [facility] at [phone]. Received fax confirmation, waiting on determination.

## 2021-08-04 ENCOUNTER — Other Ambulatory Visit: Payer: Self-pay | Admitting: Neurology

## 2021-08-21 ENCOUNTER — Encounter: Payer: Self-pay | Admitting: *Deleted

## 2021-08-22 NOTE — Telephone Encounter (Signed)
Initiated new PA on CMM via [employer]. Key: [id number]. Received the following response: "Your PA has been resolved, no additional PA is required. For further inquiries please contact the number on the back of the member prescription card."

## 2021-08-29 ENCOUNTER — Other Ambulatory Visit: Payer: Self-pay | Admitting: Neurology

## 2021-09-06 MED ORDER — CIPROFLOXACIN HCL 500 MG PO TABS
500.0000 mg | ORAL_TABLET | Freq: Two times a day (BID) | ORAL | 0 refills | Status: DC
Start: 2021-09-06 — End: 2021-12-26

## 2021-10-06 ENCOUNTER — Other Ambulatory Visit: Payer: Self-pay | Admitting: Neurology

## 2021-11-06 ENCOUNTER — Other Ambulatory Visit: Payer: Self-pay | Admitting: Neurology

## 2021-11-17 ENCOUNTER — Other Ambulatory Visit: Payer: Self-pay | Admitting: Neurology

## 2021-11-20 NOTE — Telephone Encounter (Signed)
I called patient. She has been getting a 30 day supply of oxybutynin from [facility] but now her insurance will only cover a 90 day supply. She only has a 30 day supply left and needs another RX for a 90 day supply [facility] to [facility] on [address]. RX for oxybutynin sent.

## 2021-12-19 ENCOUNTER — Encounter: Payer: Self-pay | Admitting: Hematology and Oncology

## 2021-12-26 ENCOUNTER — Ambulatory Visit (INDEPENDENT_AMBULATORY_CARE_PROVIDER_SITE_OTHER): Payer: 59 | Admitting: Neurology

## 2021-12-26 ENCOUNTER — Encounter: Payer: Self-pay | Admitting: Hematology and Oncology

## 2021-12-26 ENCOUNTER — Encounter: Payer: Self-pay | Admitting: Neurology

## 2021-12-26 ENCOUNTER — Other Ambulatory Visit: Payer: Self-pay

## 2021-12-26 VITALS — BP 132/82 | HR 62 | Ht 71.0 in | Wt 263.0 lb

## 2021-12-26 DIAGNOSIS — G43109 Migraine with aura, not intractable, without status migrainosus: Secondary | ICD-10-CM | POA: Diagnosis not present

## 2021-12-26 DIAGNOSIS — H469 Unspecified optic neuritis: Secondary | ICD-10-CM

## 2021-12-26 DIAGNOSIS — G35 Multiple sclerosis: Secondary | ICD-10-CM

## 2021-12-26 DIAGNOSIS — E610 Copper deficiency: Secondary | ICD-10-CM

## 2021-12-26 DIAGNOSIS — Z9884 Bariatric surgery status: Secondary | ICD-10-CM

## 2021-12-26 DIAGNOSIS — F32A Depression, unspecified: Secondary | ICD-10-CM

## 2021-12-26 DIAGNOSIS — E538 Deficiency of other specified B group vitamins: Secondary | ICD-10-CM | POA: Diagnosis not present

## 2021-12-26 DIAGNOSIS — R69 Illness, unspecified: Secondary | ICD-10-CM | POA: Diagnosis not present

## 2021-12-26 DIAGNOSIS — Z79899 Other long term (current) drug therapy: Secondary | ICD-10-CM

## 2021-12-26 DIAGNOSIS — E559 Vitamin D deficiency, unspecified: Secondary | ICD-10-CM | POA: Diagnosis not present

## 2021-12-26 MED ORDER — ALPRAZOLAM 0.5 MG PO TABS: ORAL_TABLET | ORAL | 0 refills | Status: DC

## 2021-12-26 MED ORDER — SERTRALINE HCL 50 MG PO TABS: 50.0000 mg | ORAL_TABLET | Freq: Every day | ORAL | 3 refills | Status: DC

## 2021-12-26 NOTE — Progress Notes (Signed)
[facility]  PATIENT: Lynn Roberts DOB: Jun 17, 1986  REFERRING DOCTOR OR PCP:  Dr. Tama High (Ophthalmology); Amie Portland, MD (Neuro-hospitalist); Dionisio David (PCP) SOURCE:   _________________________________   HISTORICAL  CHIEF COMPLAINT:  Chief Complaint  Patient presents with   Follow-up    Rm 14, alone. Here for 6 month MS f/u, on Zeposia. Pt reports doing well. No issues or concerns today. Every now and then will get a sharp HA pn. Can get aggravated some days, not her usual self.     HISTORY OF PRESENT ILLNESS:  Lynn Roberts, is a 36 y.o. woman with relapsing remitting multiple sclerosis.   UPDATE 12/26/2021: She is on Zeposia and tolerates it well.  She denies any recent exacerbations or new neurologic symptoms.  She feels her gait is stable and she has no difficulty on even surfaces.   with mildly reduced balance.   She uses the bannister on stairs.   She feels strength is doing well.   No muscle spasms.   She denies tingling or numbness.    Legs are less sore than last visit.  She presented with left optic neuritis.  Vision improved but not completely to baseline and she continues to have mild visual blurring and reduced color saturation OS..   She sees  firefly like floaters.   She has urinary frequency and rare urge incontinence.  Oxybutynin has helped a lot.  Headaches continue to do well with occasional mild HA x 1-2 hours   She sometmes has leg pain and back pain since yesterday as well.  She denies much fatigue.  She is sleeping well most nights.  No significant issues with mood currently.  Cognition is doing well.   Her copper and B12 were  low and she takes supplements for these Vit D and iron.  Of note, she had gastric bypass in the past.     She is more irritable.  She denies frank depression.    She has occasional nervousness but no severe anxiety.    MS History: She presented to [facility] 02/22/2021 after she had the onset  of left eye pain, headache and complete loss of vision OS 02/21/2021.  She saw Dr. Brigitte Pulse (ophthalmology) who advised her to go to the ED.    She had studies and was admitted and received 5 days of IV steroids.  MRI of the orbits confirmed left optic neuritis.  MRI of the brain showed multiple foci consistent with MS.  2 foci in the left hemisphere enhanced after contrast.  MRI of the spine did not show any additional lesions though there was extensive movement artifact that limited the study.   She had no other neurologic episode in the past.   She began to improve a day after the IV Solu-Medrol was initiated and was much better by discharge.      Other Medical:  She was > 650 pounds and had bariatric surgery (duodenal switch), losing > 400 pounds in 2015.   She had an anal fissure last year requiring surgery and still has some mild bleeding.   She has seen surgery and they have requested a colonoscopy.     She smokes and is trying to quit (using patches).   She has two kids ages 62 and 54.       Imaging: MRI of the brain 02/22/2021 shows multiple T2/FLAIR hyperintense foci in the hemispheres in a pattern consistent with multiple sclerosis.  2 foci on the left show  subtle enhancement after contrast.  MRI of the orbits 02/22/2021 shows enhancement of the left optic nerve.  MRI of the cervical spine 02/23/2021 limited by movement artifact shows a normal spinal cord and mild multilevel degenerative changes.  MRI of the thoracic spine 02/23/2021 limited by movement artifact shows a normal spinal cord and degenerative changes with mild spinal stenosis at T11-T12 and T12-L1.   Laboratory tests March 2022:   Anti-NMO and anti-MOG are negative.  B12 low at 152 (180-914).   EKG normal 2/.2021  REVIEW OF SYSTEMS: Constitutional: No fevers, chills, sweats, or change in appetite Eyes: No visual changes, double vision, eye pain Ear, nose and throat: No hearing loss, ear pain, nasal congestion, sore  throat Cardiovascular: No chest pain, palpitations Respiratory:  No shortness of breath at rest or with exertion.   No wheezes GastrointestinaI: No nausea, vomiting, diarrhea, abdominal pain, fecal incontinence Genitourinary:  No dysuria, urinary retention or frequency.  No nocturia. Musculoskeletal:  No neck pain, back pain Integumentary: No rash, pruritus, skin lesions Neurological: as above Psychiatric: No depression at this time.  No anxiety Endocrine: No palpitations, diaphoresis, change in appetite, change in weigh or increased thirst Hematologic/Lymphatic:  No anemia, purpura, petechiae. Allergic/Immunologic: No itchy/runny eyes, nasal congestion, recent allergic reactions, rashes  ALLERGIES: Allergies  Allergen Reactions   Nsaids Other (See Comments)    HX. OF WEIGHT-LOSS SURGERY    Other Rash    Reaction to LOTIONS    Sulfa Antibiotics Rash   Tramadol Rash    Had after 2014 and tolerated okay    HOME MEDICATIONS:  Current Outpatient Medications:    ALPRAZolam (XANAX) 0.5 MG tablet, Two to three po before MRI.  MMust have driver, Disp: 3 tablet, Rfl: 0   Copper Gluconate (COPPER CAPS) 2 MG CAPS, Take 1 capsule by mouth daily., Disp: , Rfl:    cyanocobalamin (,VITAMIN B-12,) 1000 MCG/ML injection, INJECT 1 ML INTO THE SKIN EVERY 4 WEEKS, Disp: 4 mL, Rfl: 1   Multiple Vitamin (MULTIVITAMIN WITH MINERALS) TABS tablet, Take 1 tablet by mouth daily., Disp: 30 tablet, Rfl: 0   ondansetron (ZOFRAN) 4 MG tablet, Take 1 tablet (4 mg total) by mouth as needed for nausea or vomiting., Disp: 90 tablet, Rfl: 1   oxybutynin (DITROPAN) 5 MG tablet, TAKE 1 TABLET BY MOUTH TWICE A DAY, Disp: 180 tablet, Rfl: 0   Ozanimod HCl (ZEPOSIA) 0.92 MG CAPS, Take by mouth., Disp: , Rfl:    sertraline (ZOLOFT) 50 MG tablet, Take 1 tablet (50 mg total) by mouth daily., Disp: 90 tablet, Rfl: 3   Vitamin D, Ergocalciferol, (DRISDOL) 1.25 MG (50000 UNIT) CAPS capsule, Take 1 capsule (50,000 Units total)  by mouth every 7 (seven) days., Disp: 13 capsule, Rfl: 1  PAST MEDICAL HISTORY: Past Medical History:  Diagnosis Date   Arthritis of low back    Complication of anesthesia    is of [name] descent   Family history of adverse reaction to anesthesia    pt is a [name]   GERD (gastroesophageal reflux disease)    none since pregnancy    Heart murmur    When pt. was pregnant with her last child, and also had murmur as a child.     Hemorrhoids    History of anal fissures    History of PAT (paroxysmal atrial tachycardia)    had cardiac ablation as a teenager   History of sleep apnea    none since 450 pound weight loss in 2015  Migraines    MS (multiple sclerosis) (HCC)    PCOD (polycystic ovarian disease)    no menses    PAST SURGICAL HISTORY: Past Surgical History:  Procedure Laterality Date   BREAST LUMPECTOMY Left 2011   papilloma   BREAST LUMPECTOMY Left 2015   papilloma   BREAST LUMPECTOMY WITH RADIOACTIVE SEED LOCALIZATION Left 10/18/2014   Procedure:  RADIOACTIVE SEED GUIDED EXCISIONAL BREAST BIOPSY;  Surgeon: Alphonsa Overall, MD;  Location: [facility];  Service: General;  Laterality: Left;   BREAST SURGERY Right 07/27/2004   retro-areolar dissection with exc. of large duct   CARDIAC ELECTROPHYSIOLOGY MAPPING AND ABLATION  sge 13   CARDIAC ELECTROPHYSIOLOGY STUDY AND ABLATION  age 59   CESAREAN SECTION N/A 03/28/2020   Procedure: [place];  Surgeon: Deliah Boston, MD;  Location: [facility];  Service: Obstetrics;  Laterality: N/A;  RNFA [place]   CESAREAN SECTION  03/2018   CHOLECYSTECTOMY  04/27/2008   GASTROPLASTY DUODENAL SWITCH  01/2014   done at [facility] N/A 08/15/2020   Procedure: ANORECTAL EXAM UNDER ANESTHESIA INCISION AND DRAINAGE OF PERIRECTAL ABSCESS x2 WITH PLACEMENT OF SETON;  Surgeon: Ileana Roup, MD;  Location: [facility];  Service: General;  Laterality: N/A;   OVARIAN CYST REMOVAL  Bilateral 1999   PLACEMENT OF SETON  08/15/2020   PLACEMENT OF SETON N/A 12/08/2020   Procedure: PLACEMENT OF CUTTING SETON; PARTIAL FISTULOTOMY;  Surgeon: Ileana Roup, MD;  Location: [facility];  Service: General;  Laterality: N/A;   RECTAL EXAM UNDER ANESTHESIA N/A 12/08/2020   Procedure: ANORECTAL EXAM UNDER ANESTHESIA;  Surgeon: Ileana Roup, MD;  Location: [facility];  Service: General;  Laterality: N/A;   TOENAIL EXCISION  yrs ago   UNILATERAL SALPINGECTOMY      FAMILY HISTORY: Family History  Problem Relation Age of Onset   Cancer Paternal Grandfather        pancreactic   Cancer Maternal Grandmother        multiple myaloma, breast cancer   Heart disease Maternal Grandmother    Diabetes Maternal Grandfather    Hypertension Father    Hyperlipidemia Father    Hypertension Mother    Diabetes Mother    Colon cancer Neg Hx    Stomach cancer Neg Hx    Rectal cancer Neg Hx    Esophageal cancer Neg Hx     SOCIAL HISTORY:  Social History   Socioeconomic History   Marital status: Married    Spouse name: Quillian Quince   Number of children: 2   Years of education: 12   Highest education level: Not on file  Occupational History   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   Caffeine use: 1 cup per day of tea   Social Determinants of Health  Financial Resource Strain: Not on file  Food Insecurity: Not on file  Transportation Needs: Not on file  Physical  Activity: Not on file  Stress: Not on file  Social Connections: Not on file  Intimate Partner Violence: Not on file     PHYSICAL EXAM  Vitals:   12/26/21 1549  BP: 132/82  Pulse: 62  Weight: 263 lb (119.3 kg)  Height: 5\' 11"  (1.803 m)    Body mass index is 36.68 kg/m.   No results found.   General: The patient is well-developed and well-nourished and in no acute distress  HEENT:  Head is [place]/AT.  Sclera are anicteric.    Skin: Extremities are without rash or edema.  Neurologic Exam  Mental status: The patient is alert and oriented x 3 at the time of the examination. The patient has apparent normal recent and remote memory, with an apparently normal attention span and concentration ability.   Speech is normal.  Cranial nerves: Extraocular movements are full.  She reports mildly reduced color vision OS as she has a 2+ APD.     Facial symmetry is present. There is good facial sensation to soft touch bilaterally.Facial strength is normal.  No obvious hearing deficits are noted.  Motor:  Muscle bulk is normal.   Tone is normal. Strength is  5 / 5 in all 4 extremities.   Sensory: Sensory testing is intact to pinprick, soft touch and vibration sensation in all 4 extremities.  Coordination: Cerebellar testing reveals good finger-nose-finger and heel-to-shin bilaterally.  Gait and station: Station is normal.   Gait is normal.  Tandem gait is normal.  Romberg is negative.  Reflexes: Deep tendon reflexes are symmetric and normal bilaterally.      DIAGNOSTIC DATA (LABS, IMAGING, TESTING) - I reviewed patient records, labs, notes, testing and imaging myself where available.  Lab Results  Component Value Date   WBC 5.7 06/20/2021   HGB 10.8 (L) 06/20/2021   HCT 34.5 06/20/2021   MCV 78 (L) 06/20/2021   PLT 226 06/20/2021      Component Value Date/Time   NA 143 06/20/2021 1539   K 4.4 06/20/2021 1539   CL 108 (H) 06/20/2021 1539   CO2 24 06/20/2021 1539   GLUCOSE 77  06/20/2021 1539   GLUCOSE 142 (H) 02/26/2021 0325   BUN 9 06/20/2021 1539   CREATININE 0.53 (L) 06/20/2021 1539   CREATININE 0.62 03/03/2020 1452   CALCIUM 8.6 (L) 06/20/2021 1539   PROT 6.6 06/20/2021 1539   ALBUMIN 4.1 06/20/2021 1539   AST 16 06/20/2021 1539   AST 10 (L) 03/03/2020 1452   ALT 11 06/20/2021 1539   ALT 10 03/03/2020 1452   ALKPHOS 54 06/20/2021 1539   BILITOT 0.4 06/20/2021 1539   BILITOT 0.4 03/03/2020 1452   GFRNONAA >60 02/26/2021 0325   GFRNONAA >60 03/03/2020 1452   GFRAA >60 08/15/2020 0354   GFRAA >60 03/03/2020 1452   No results found for: CHOL, HDL, LDLCALC, LDLDIRECT, TRIG, CHOLHDL Lab Results  Component Value Date   HGBA1C 4.6 (L) 11/05/2017   Lab Results  Component Value Date   VITAMINB12 152 (L) 02/23/2021   Lab Results  Component Value Date   TSH 1.380 10/12/2019       ASSESSMENT AND PLAN  Multiple sclerosis ([place]) - Plan: CBC with Differential/Platelet, Hepatic function panel, MR BRAIN W WO CONTRAST  High risk medication use - Plan: CBC with Differential/Platelet, Hepatic function panel  Migraine with aura and without status migrainosus,  not intractable  S/P bariatric surgery  B12 deficiency  Copper deficiency  Vitamin D deficiency  Optic neuritis  Depression, unspecified depression type   Continue Zeposia and check labs.  Additionally we will check an MRI of the brain to determine if she is having breakthrough activity.  If this is occurring we would need to consider a different disease modifying therapy. Stay active and exercise as tolerated.   Continue Vit D, Vit B12 (shots) and copper supplements.   Rtc 6 months or call sooner if ne or worsening issues.  Cuca Benassi A. Felecia Shelling, MD, [facility] 01/10/1981, 2:21 PM Certified in Neurology, Clinical Neurophysiology, Sleep Medicine and Neuroimaging  [facility] [address] [place], [place] [address] [phone]

## 2021-12-27 LAB — HEPATIC FUNCTION PANEL
ALT: 11 IU/L (ref 0–32)
AST: 14 IU/L (ref 0–40)
Albumin: 3.6 g/dL — ABNORMAL LOW (ref 3.8–4.8)
Alkaline Phosphatase: 55 IU/L (ref 44–121)
Bilirubin Total: 0.3 mg/dL (ref 0.0–1.2)
Bilirubin, Direct: 0.15 mg/dL (ref 0.00–0.40)
Total Protein: 6.1 g/dL (ref 6.0–8.5)

## 2021-12-27 LAB — CBC WITH DIFFERENTIAL/PLATELET
Basophils Absolute: 0 10*3/uL (ref 0.0–0.2)
Basos: 0 %
EOS (ABSOLUTE): 0.1 10*3/uL (ref 0.0–0.4)
Eos: 1 %
Hematocrit: 38.9 % (ref 34.0–46.6)
Hemoglobin: 12.4 g/dL (ref 11.1–15.9)
Immature Grans (Abs): 0 10*3/uL (ref 0.0–0.1)
Immature Granulocytes: 0 %
Lymphocytes Absolute: 1.3 10*3/uL (ref 0.7–3.1)
Lymphs: 17 %
MCH: 26.8 pg (ref 26.6–33.0)
MCHC: 31.9 g/dL (ref 31.5–35.7)
MCV: 84 fL (ref 79–97)
Monocytes Absolute: 0.6 10*3/uL (ref 0.1–0.9)
Monocytes: 7 %
Neutrophils Absolute: 6 10*3/uL (ref 1.4–7.0)
Neutrophils: 75 %
Platelets: 243 10*3/uL (ref 150–450)
RBC: 4.63 x10E6/uL (ref 3.77–5.28)
RDW: 13.3 % (ref 11.7–15.4)
WBC: 8 10*3/uL (ref 3.4–10.8)

## 2022-01-01 ENCOUNTER — Telehealth: Payer: Self-pay | Admitting: Neurology

## 2022-01-01 NOTE — Telephone Encounter (Signed)
[facility] [employer] Josem Kaufmann: [id number] (exp. 01/01/22 to 06/30/22) &  [employer] Josem Kaufmann: [id number] (exp. 01/01/22 to 01/31/22) order sent to [facility], bc that is who is in network with that medicaid plan

## 2022-01-02 NOTE — Telephone Encounter (Signed)
2/15 11AM

## 2022-01-16 DIAGNOSIS — G35 Multiple sclerosis: Secondary | ICD-10-CM | POA: Diagnosis not present

## 2022-01-22 ENCOUNTER — Encounter: Payer: Self-pay | Admitting: Neurology

## 2022-02-01 ENCOUNTER — Encounter: Payer: Self-pay | Admitting: Neurology

## 2022-02-01 ENCOUNTER — Other Ambulatory Visit: Payer: Self-pay | Admitting: Neurology

## 2022-02-20 ENCOUNTER — Other Ambulatory Visit: Payer: Self-pay | Admitting: Neurology

## 2022-03-07 ENCOUNTER — Other Ambulatory Visit: Payer: Self-pay | Admitting: Neurology

## 2022-03-07 ENCOUNTER — Telehealth: Payer: Self-pay | Admitting: *Deleted

## 2022-03-07 NOTE — Telephone Encounter (Signed)
Initiated PA Zeposia on CMM. Key: [id number]. Waiting on questions to be available from insurance to proceed w/ PA. ?

## 2022-03-07 NOTE — Telephone Encounter (Signed)
Submitted PA. Waiting on determination from [employer]. 

## 2022-03-12 NOTE — Telephone Encounter (Signed)
PA for Henrietta Dine has been approved from 03/12/2022-03/13/2023. PA #: [id number]. ?

## 2022-05-09 ENCOUNTER — Other Ambulatory Visit: Payer: Self-pay | Admitting: Neurology

## 2022-05-23 ENCOUNTER — Encounter: Payer: Self-pay | Admitting: Neurology

## 2022-05-24 ENCOUNTER — Encounter: Payer: Self-pay | Admitting: Neurology

## 2022-05-29 DIAGNOSIS — Z3201 Encounter for pregnancy test, result positive: Secondary | ICD-10-CM | POA: Diagnosis not present

## 2022-06-01 ENCOUNTER — Encounter: Payer: Self-pay | Admitting: Hematology and Oncology

## 2022-06-05 ENCOUNTER — Ambulatory Visit (INDEPENDENT_AMBULATORY_CARE_PROVIDER_SITE_OTHER): Payer: 59 | Admitting: Neurology

## 2022-06-05 ENCOUNTER — Telehealth: Payer: Self-pay | Admitting: *Deleted

## 2022-06-05 ENCOUNTER — Encounter: Payer: Self-pay | Admitting: Neurology

## 2022-06-05 VITALS — BP 131/72 | HR 69 | Ht 71.0 in | Wt 255.5 lb

## 2022-06-05 DIAGNOSIS — Z79899 Other long term (current) drug therapy: Secondary | ICD-10-CM | POA: Diagnosis not present

## 2022-06-05 DIAGNOSIS — G35 Multiple sclerosis: Secondary | ICD-10-CM | POA: Diagnosis not present

## 2022-06-05 DIAGNOSIS — E610 Copper deficiency: Secondary | ICD-10-CM

## 2022-06-05 DIAGNOSIS — H469 Unspecified optic neuritis: Secondary | ICD-10-CM | POA: Diagnosis not present

## 2022-06-05 DIAGNOSIS — O26891 Other specified pregnancy related conditions, first trimester: Secondary | ICD-10-CM | POA: Diagnosis not present

## 2022-06-05 DIAGNOSIS — O09519 Supervision of elderly primigravida, unspecified trimester: Secondary | ICD-10-CM | POA: Diagnosis not present

## 2022-06-05 DIAGNOSIS — Z9884 Bariatric surgery status: Secondary | ICD-10-CM | POA: Diagnosis not present

## 2022-06-05 DIAGNOSIS — E538 Deficiency of other specified B group vitamins: Secondary | ICD-10-CM

## 2022-06-05 DIAGNOSIS — Z3A09 9 weeks gestation of pregnancy: Secondary | ICD-10-CM | POA: Diagnosis not present

## 2022-06-05 NOTE — Telephone Encounter (Signed)
Faxed completed/signed glatiramer start form to [place] at [phone]. Received fax confirmation.

## 2022-06-05 NOTE — Progress Notes (Signed)
[facility]  PATIENT: Lynn Roberts DOB: [birth year]  REFERRING DOCTOR OR PCP:  Dr. Tama High (Ophthalmology); Amie Portland, MD (Neuro-hospitalist); Dionisio David (PCP) SOURCE:   _________________________________   HISTORICAL  CHIEF COMPLAINT:  Chief Complaint  Patient presents with   Follow-up    Rm 2, w husband Lynn Roberts. Here to discuss medication options.     HISTORY OF PRESENT ILLNESS:  [name], is a 36 y.o. woman with relapsing remitting multiple sclerosis.   UPDATE 06/05/2022: She was on Zeposia and tolerated it well.  She denies any recent exacerbations or new neurologic symptoms.    She is [redacted] weeks pregnant and stopped the Zeposia 2 wekes ago when she found out she was pregnant.   She was not expecting to be pregnant so waited a bit longer to do the test.  This is her 4th pregnancy (1st one was miscarriage at 6 weeks, has 2 kids.  This one unplanned)  She feels her gait is stable and she has no difficulty on even surfaces.   with mildly reduced balance.   She uses the bannister on stairs.   She feels strength is doing well.   No muscle spasms.   She denies tingling or numbness.    Legs are less sore than last visit.  She presented with left optic neuritis.  Vision improved but not completely to baseline and she continues to have mild visual blurring and reduced color saturation OS..   She sees  firefly like floaters.   She has urinary frequency and rare urge incontinence.  Oxybutynin has helped a lot.  Headaches continue to do well with occasional mild HA x 1-2 hours   She sometmes has leg pain and back pain since yesterday as well.  She has more fatigue.  She is sleeping well most nights.  Mood is ok..    She has occasional nervousness but no severe anxiety.    Cognition is doing well.   Her copper and B12 were  low and she takes supplements for these Vit D and iron.  Of note, she had gastric bypass in the past.      She is seeing  [facility]     MS History: She presented to [facility] 02/22/2021 after she had the onset of left eye pain, headache and complete loss of vision OS 02/21/2021.  She saw Dr. Brigitte Pulse (ophthalmology) who advised her to go to the ED.    She had studies and was admitted and received 5 days of IV steroids.  MRI of the orbits confirmed left optic neuritis.  MRI of the brain showed multiple foci consistent with MS.  2 foci in the left hemisphere enhanced after contrast.  MRI of the spine did not show any additional lesions though there was extensive movement artifact that limited the study.   She had no other neurologic episode in the past.   She began to improve a day after the IV Solu-Medrol was initiated and was much better by discharge.      She started Zeposia 04/2021.    She became pregnant and stopped Zeposia 05/2021 (was 7-[redacted] weeks pregnant at time)   She started Copaxone 06/2022.       Other Medical:  She was > 650 pounds and had bariatric surgery (duodenal switch), losing > 400 pounds in 2015.   She had an anal fissure last year requiring surgery and still has some mild bleeding.   She has seen surgery and  they have requested a colonoscopy.     She smokes and is trying to quit (using patches).   She has two kids ages 28 and 12.       Imaging: MRI of the brain 02/22/2021 shows multiple T2/FLAIR hyperintense foci in the hemispheres in a pattern consistent with multiple sclerosis.  2 foci on the left show subtle enhancement after contrast.  MRI of the orbits 02/22/2021 shows enhancement of the left optic nerve.  MRI of the cervical spine 02/23/2021 limited by movement artifact shows a normal spinal cord and mild multilevel degenerative changes.  MRI of the thoracic spine 02/23/2021 limited by movement artifact shows a normal spinal cord and degenerative changes with mild spinal stenosis at T11-T12 and T12-L1.   Laboratory tests March 2022:   Anti-NMO and anti-MOG are negative.  B12 low at  152 (180-914).   EKG normal 2/.2021  REVIEW OF SYSTEMS: Constitutional: No fevers, chills, sweats, or change in appetite Eyes: No visual changes, double vision, eye pain Ear, nose and throat: No hearing loss, ear pain, nasal congestion, sore throat Cardiovascular: No chest pain, palpitations Respiratory:  No shortness of breath at rest or with exertion.   No wheezes GastrointestinaI: No nausea, vomiting, diarrhea, abdominal pain, fecal incontinence Genitourinary:  No dysuria, urinary retention or frequency.  No nocturia. Musculoskeletal:  No neck pain, back pain Integumentary: No rash, pruritus, skin lesions Neurological: as above Psychiatric: No depression at this time.  No anxiety Endocrine: No palpitations, diaphoresis, change in appetite, change in weigh or increased thirst Hematologic/Lymphatic:  No anemia, purpura, petechiae. Allergic/Immunologic: No itchy/runny eyes, nasal congestion, recent allergic reactions, rashes  ALLERGIES: Allergies  Allergen Reactions   Nsaids Other (See Comments)    HX. OF WEIGHT-LOSS SURGERY    Other Rash    Reaction to LOTIONS    Sulfa Antibiotics Rash   Tramadol Rash    Had after 2014 and tolerated okay    HOME MEDICATIONS:  Current Outpatient Medications:    cholecalciferol (VITAMIN D3) 25 MCG (1000 UNIT) tablet, Take 1,000 Units by mouth daily., Disp: , Rfl:    Copper Gluconate (COPPER CAPS) 2 MG CAPS, Take 1 capsule by mouth daily., Disp: , Rfl:    cyanocobalamin (,VITAMIN B-12,) 1000 MCG/ML injection, INJECT 1 ML INTO THE SKIN EVERY 4 WEEKS, Disp: 3 mL, Rfl: 1   ferrous sulfate 325 (65 FE) MG tablet, Take 325 mg by mouth daily with breakfast., Disp: , Rfl:    Multiple Vitamin (MULTIVITAMIN WITH MINERALS) TABS tablet, Take 1 tablet by mouth daily., Disp: 30 tablet, Rfl: 0   Prenat-FeCbn-FeAsp-Meth-FA-DHA (PRENATE MINI) 18-0.6-0.4-350 MG CAPS, Take 1 capsule by mouth daily., Disp: , Rfl:    sertraline (ZOLOFT) 50 MG tablet, Take 1 tablet  (50 mg total) by mouth daily., Disp: 90 tablet, Rfl: 3  PAST MEDICAL HISTORY: Past Medical History:  Diagnosis Date   Arthritis of low back    Complication of anesthesia    is of [name] descent   Family history of adverse reaction to anesthesia    pt is a Lumbi [name]   GERD (gastroesophageal reflux disease)    none since pregnancy    Heart murmur    When pt. was pregnant with her last child, and also had murmur as a child.     Hemorrhoids    History of anal fissures    History of PAT (paroxysmal atrial tachycardia)    had cardiac ablation as a teenager   History of sleep apnea  none since 450 pound weight loss in 2015   Migraines    MS (multiple sclerosis) (HCC)    PCOD (polycystic ovarian disease)    no menses    PAST SURGICAL HISTORY: Past Surgical History:  Procedure Laterality Date   BREAST LUMPECTOMY Left 2011   papilloma   BREAST LUMPECTOMY Left 2015   papilloma   BREAST LUMPECTOMY WITH RADIOACTIVE SEED LOCALIZATION Left 10/18/2014   Procedure:  RADIOACTIVE SEED GUIDED EXCISIONAL BREAST BIOPSY;  Surgeon: Alphonsa Overall, MD;  Location: [facility];  Service: General;  Laterality: Left;   BREAST SURGERY Right 07/27/2004   retro-areolar dissection with exc. of large duct   CARDIAC ELECTROPHYSIOLOGY MAPPING AND ABLATION  sge 13   CARDIAC ELECTROPHYSIOLOGY STUDY AND ABLATION  age 42   CESAREAN SECTION N/A 03/28/2020   Procedure: [place];  Surgeon: Deliah Boston, MD;  Location: [facility];  Service: Obstetrics;  Laterality: N/A;  RNFA [place]   CESAREAN SECTION  03/2018   CHOLECYSTECTOMY  04/27/2008   GASTROPLASTY DUODENAL SWITCH  01/2014   done at [place] N/A 08/15/2020   Procedure: ANORECTAL EXAM UNDER ANESTHESIA INCISION AND DRAINAGE OF PERIRECTAL ABSCESS x2 WITH PLACEMENT OF SETON;  Surgeon: Ileana Roup, MD;  Location: [facility];  Service: General;  Laterality: N/A;   OVARIAN CYST REMOVAL Bilateral  1999   PLACEMENT OF SETON  08/15/2020   PLACEMENT OF SETON N/A 12/08/2020   Procedure: PLACEMENT OF CUTTING SETON; PARTIAL FISTULOTOMY;  Surgeon: Ileana Roup, MD;  Location: [facility];  Service: General;  Laterality: N/A;   RECTAL EXAM UNDER ANESTHESIA N/A 12/08/2020   Procedure: ANORECTAL EXAM UNDER ANESTHESIA;  Surgeon: Ileana Roup, MD;  Location: [facility];  Service: General;  Laterality: N/A;   TOENAIL EXCISION  yrs ago   UNILATERAL SALPINGECTOMY      FAMILY HISTORY: Family History  Problem Relation Age of Onset   Cancer Paternal Grandfather        pancreactic   Cancer Maternal Grandmother        multiple myaloma, breast cancer   Heart disease Maternal Grandmother    Diabetes Maternal Grandfather    Hypertension Father    Hyperlipidemia Father    Hypertension Mother    Diabetes Mother    Colon cancer Neg Hx    Stomach cancer Neg Hx    Rectal cancer Neg Hx    Esophageal cancer Neg Hx     SOCIAL HISTORY:  Social History   Socioeconomic History   Marital status: Married    Spouse name: Lynn Roberts   Number of children: 2   Years of education: 12   Highest education level: Not on file  Occupational History   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   Caffeine use: 1  cup per day of tea   Social Determinants of Health   Financial Resource Strain: Not on file  Food Insecurity: Not on file  Transportation Needs: Not on file  Physical  Activity: Not on file  Stress: Not on file  Social Connections: Not on file  Intimate Partner Violence: Not on file     PHYSICAL EXAM  Vitals:   06/05/22 1433  BP: 131/72  Pulse: 69  Weight: 255 lb 8 oz (115.9 kg)  Height: '5\' 11"'$  (1.803 m)    Body mass index is 35.64 kg/m.   No results found.   General: The patient is well-developed and well-nourished and in no acute distress  HEENT:  Head is [place]/AT.  Sclera are anicteric.    Skin: Extremities are without rash or edema.  Neurologic Exam  Mental status: The patient is alert and oriented x 3 at the time of the examination. The patient has apparent normal recent and remote memory, with an apparently normal attention span and concentration ability.   Speech is normal.  Cranial nerves: Extraocular movements are full.  She has reduced color vision OS.  Visual acuity is fairly symmetrical.  There is good facial sensation to soft touch bilaterally.Facial strength is normal.  No obvious hearing deficits are noted.  Motor:  Muscle bulk is normal.   Tone is normal. Strength is  5 / 5 in all 4 extremities.   Sensory: Sensory testing is intact to pinprick, soft touch and vibration sensation in all 4 extremities.  Coordination: Cerebellar testing reveals good finger-nose-finger and heel-to-shin bilaterally.  Gait and station: Station is normal.   Gait is normal.  Tandem gait is normal.  Romberg is negative.  Reflexes: Deep tendon reflexes are symmetric and normal bilaterally.      DIAGNOSTIC DATA (LABS, IMAGING, TESTING) - I reviewed patient records, labs, notes, testing and imaging myself where available.  Lab Results  Component Value Date   WBC 8.0 12/26/2021   HGB 12.4 12/26/2021   HCT 38.9 12/26/2021   MCV 84 12/26/2021   PLT 243 12/26/2021      Component Value Date/Time   NA 143 06/20/2021 1539   K 4.4 06/20/2021 1539   CL 108 (H) 06/20/2021 1539   CO2 24 06/20/2021 1539   GLUCOSE 77 06/20/2021 1539   GLUCOSE  142 (H) 02/26/2021 0325   BUN 9 06/20/2021 1539   CREATININE 0.53 (L) 06/20/2021 1539   CREATININE 0.62 03/03/2020 1452   CALCIUM 8.6 (L) 06/20/2021 1539   PROT 6.1 12/26/2021 1634   ALBUMIN 3.6 (L) 12/26/2021 1634   AST 14 12/26/2021 1634   AST 10 (L) 03/03/2020 1452   ALT 11 12/26/2021 1634   ALT 10 03/03/2020 1452   ALKPHOS 55 12/26/2021 1634   BILITOT 0.3 12/26/2021 1634   BILITOT 0.4 03/03/2020 1452   GFRNONAA >60 02/26/2021 0325   GFRNONAA >60 03/03/2020 1452   GFRAA >60 08/15/2020 0354   GFRAA >60 03/03/2020 1452   No results found for: "CHOL", "HDL", "LDLCALC", "LDLDIRECT", "TRIG", "CHOLHDL" Lab Results  Component Value Date   HGBA1C 4.6 (L) 11/05/2017   Lab Results  Component Value Date   VITAMINB12 152 (L) 02/23/2021   Lab Results  Component Value Date   TSH 1.380 10/12/2019       ASSESSMENT AND PLAN  Multiple sclerosis (HCC)  High risk medication use  B12 deficiency  Copper deficiency  S/P bariatric surgery  Optic neuritis  [redacted] weeks gestation of pregnancy   She will  remain off of Zeposia and check labs.  We discussed that we have 2 options.  One would be to remain off of a disease modifying therapy in another would be to start glatiramer/Copaxone as it appears to be safe during pregnancy.  Because the optic neuritis was fairly severe when it was present and she still has sequela, she would like to go on medication.   Stay active and exercise as tolerated.   Continue Vit D, Vit B12 (shots) and copper supplements.   Rtc 6 months or call sooner if ne or worsening issues.  Around that visit we will get her reenrolled into the [facility] program and have her restart shortly after delivery.  We also discussed that if she had a breakthrough exacerbation while on Copaxone we would need to consider staying the course or trying a different medication  Siena Poehler A. Felecia Shelling, MD, [facility] 0/06/6225, 3:33 PM Certified in Neurology, Clinical Neurophysiology, Sleep  Medicine and Neuroimaging  [facility] [address] [place], [place] [address] [phone]

## 2022-06-06 ENCOUNTER — Encounter: Payer: Self-pay | Admitting: Hematology and Oncology

## 2022-06-07 DIAGNOSIS — Z3689 Encounter for other specified antenatal screening: Secondary | ICD-10-CM | POA: Diagnosis not present

## 2022-06-07 DIAGNOSIS — Z8679 Personal history of other diseases of the circulatory system: Secondary | ICD-10-CM | POA: Diagnosis not present

## 2022-06-07 DIAGNOSIS — R69 Illness, unspecified: Secondary | ICD-10-CM | POA: Diagnosis not present

## 2022-06-07 DIAGNOSIS — Z113 Encounter for screening for infections with a predominantly sexual mode of transmission: Secondary | ICD-10-CM | POA: Diagnosis not present

## 2022-06-07 DIAGNOSIS — Z9884 Bariatric surgery status: Secondary | ICD-10-CM | POA: Diagnosis not present

## 2022-06-07 DIAGNOSIS — Z363 Encounter for antenatal screening for malformations: Secondary | ICD-10-CM | POA: Diagnosis not present

## 2022-06-07 LAB — OB RESULTS CONSOLE HEPATITIS B SURFACE ANTIGEN: Hepatitis B Surface Ag: NEGATIVE

## 2022-06-07 LAB — OB RESULTS CONSOLE HIV ANTIBODY (ROUTINE TESTING): HIV: NONREACTIVE

## 2022-06-07 LAB — OB RESULTS CONSOLE GC/CHLAMYDIA
Chlamydia: NEGATIVE
Neisseria Gonorrhea: NEGATIVE

## 2022-06-07 LAB — OB RESULTS CONSOLE ANTIBODY SCREEN: Antibody Screen: NEGATIVE

## 2022-06-07 LAB — OB RESULTS CONSOLE RPR: RPR: NONREACTIVE

## 2022-06-07 LAB — HEPATITIS C ANTIBODY: HCV Ab: NEGATIVE

## 2022-06-07 LAB — OB RESULTS CONSOLE RUBELLA ANTIBODY, IGM: Rubella: IMMUNE

## 2022-06-07 LAB — OB RESULTS CONSOLE ABO/RH: RH Type: POSITIVE

## 2022-06-10 NOTE — Telephone Encounter (Signed)
I called [facility] to complete glatiramer '40mg'$  PA. CMM is not able to find patient/[facility] is not responding. The PA will be faxed to our office to complete.

## 2022-06-11 NOTE — Telephone Encounter (Signed)
PA for glatiramer '40mg'$  completed via fax from [place]. Faxed back to [facility]. Received a receipt of confirmation.  Should have a determination within 3-5 business days.

## 2022-06-12 NOTE — Telephone Encounter (Signed)
Received fax from [place] that [place] approved 06/11/22-06/12/23. PA# [facility] [id number]

## 2022-06-14 ENCOUNTER — Encounter: Payer: Self-pay | Admitting: Hematology and Oncology

## 2022-06-15 IMAGING — MR MR THORACIC SPINE WO/W CM
14 of 18 series · 31 of 48 positions shown · IV contrast (gadavist)
Comparison: Previous MRI from 02/22/2021.

CLINICAL DATA: Initial evaluation for demyelinating disease.

EXAM:
MRI CERVICAL AND THORACIC SPINE WITHOUT AND WITH CONTRAST
TECHNIQUE: Multiplanar and multiecho pulse sequences of the cervical spine, to
include the craniocervical junction and cervicothoracic junction,
and the thoracic spine, were obtained without and with intravenous
contrast.
CONTRAST:  10mL GADAVIST GADOBUTROL 1 MMOL/ML IV SOLN

[Series 5: T1 · sagittal · 3.0mm · 0.69mm/px · 1 of 15 slices shown (1 of 5)]
[im 1/15]
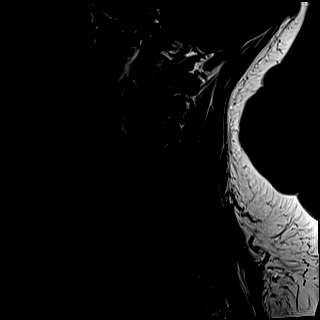

[Series 7: STIR · sagittal · 3.0mm · 0.86mm/px · 1 of 15 slices shown (1 of 2)]
[im 1/15]
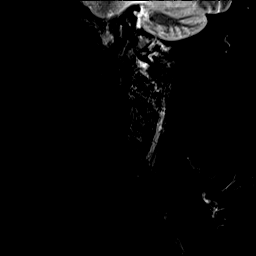

[Series 8: T2 · axial · 3.0mm · 0.70mm/px · z∈[-104,+2]mm · 3 of 31 slices shown (1 of 4)]
[im 1/31]
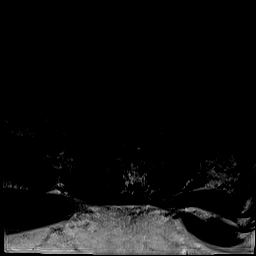
[im 16/31]
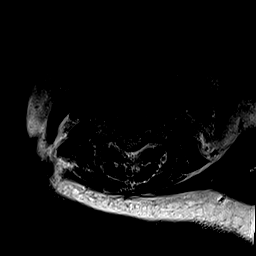
[im 31/31]
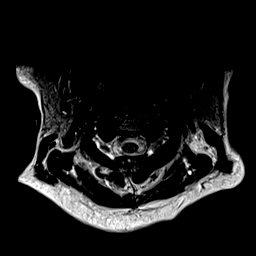

[Series 9: T2 · sagittal · 3.0mm · 0.69mm/px · 2 of 15 slices shown (2 of 4)]
[im 1/15]
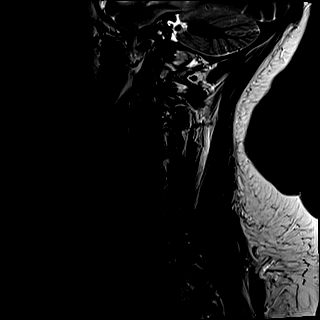
[im 15/15]
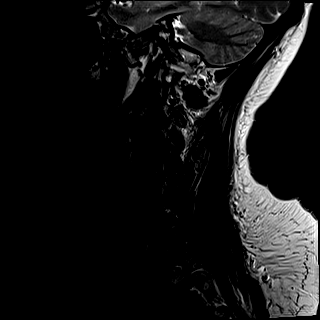

[Series 11: T1 · axial · 3.0mm · 0.35mm/px · z∈[-104,+2]mm · 4 of 31 slices shown (2 of 5)]
[im 1/31]
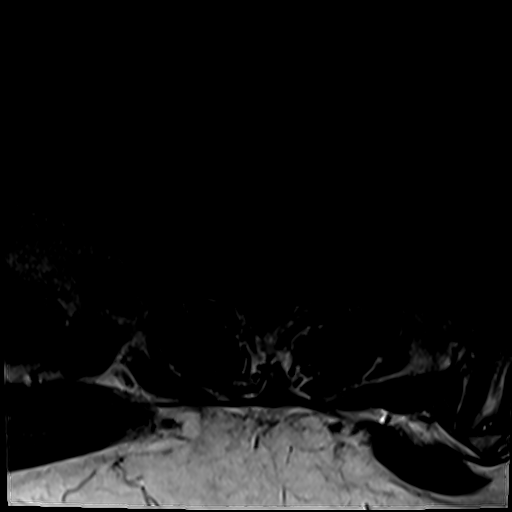
[im 11/31]
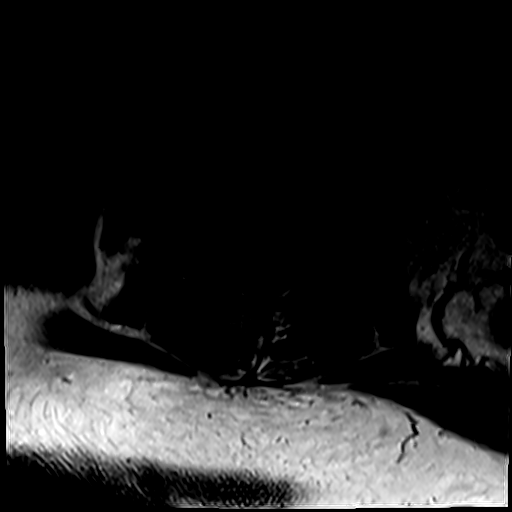
[im 21/31]
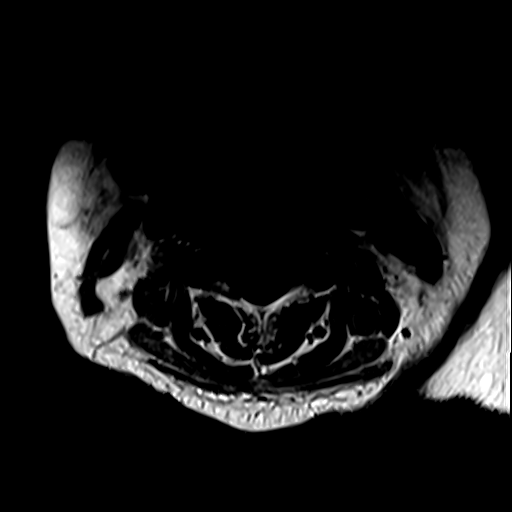
[im 31/31]
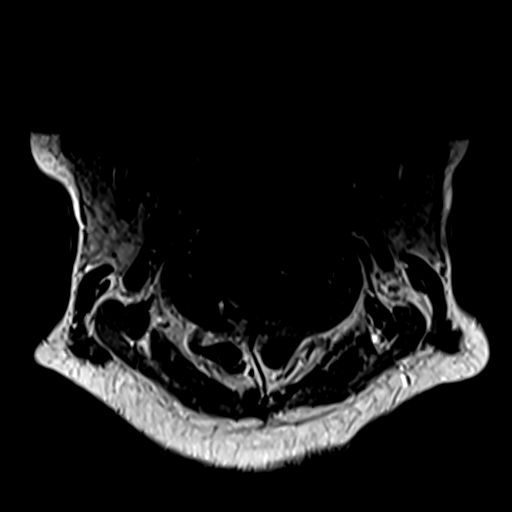

[Series 27: STIR · sagittal · 3.0mm · 1.00mm/px · 1 of 15 slices shown (2 of 2)]
[im 1/15]
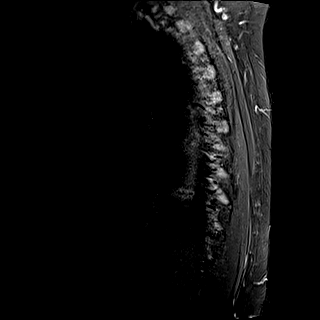

[Series 28: T1 · sagittal · 4.0mm · 1.72mm/px · 1 of 5 slices shown (3 of 5)]
[im 1/5]
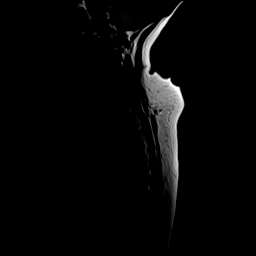

[Series 29: T1 · sagittal · 3.0mm · 1.00mm/px · 2 of 15 slices shown (4 of 5)]
[im 1/15]
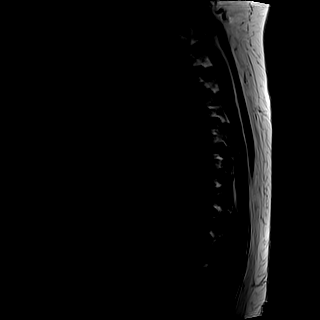
[im 15/15]
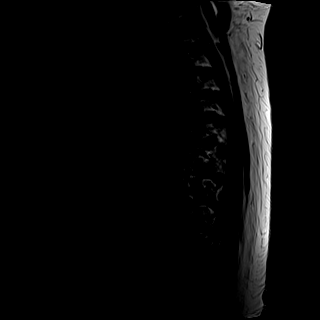

[Series 30: T2 · sagittal · 3.0mm · 0.83mm/px · 2 of 15 slices shown (3 of 4)]
[im 1/15]
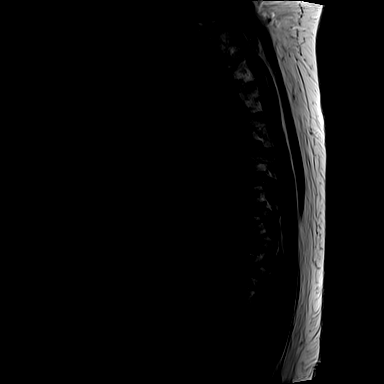
[im 15/15]
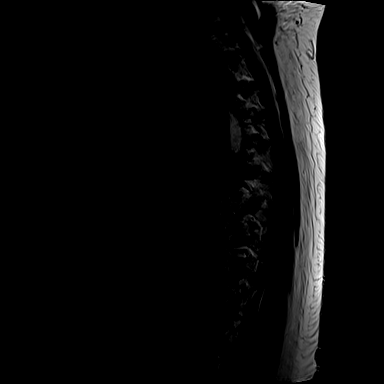

[Series 31: T1 · axial · 4.0mm · 0.39mm/px · z∈[-385,-132]mm · 4 of 36 slices shown (5 of 5)]
[im 1/36]
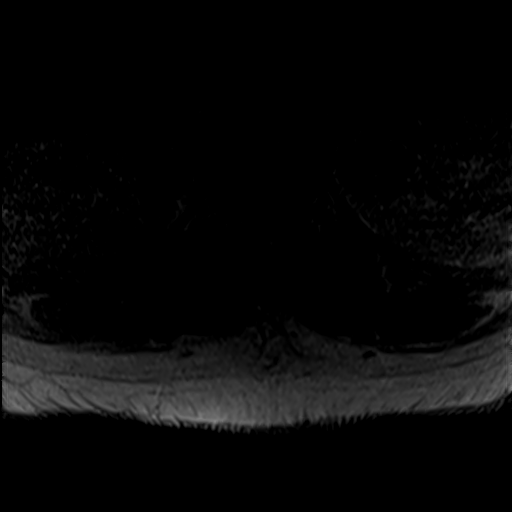
[im 12/36]
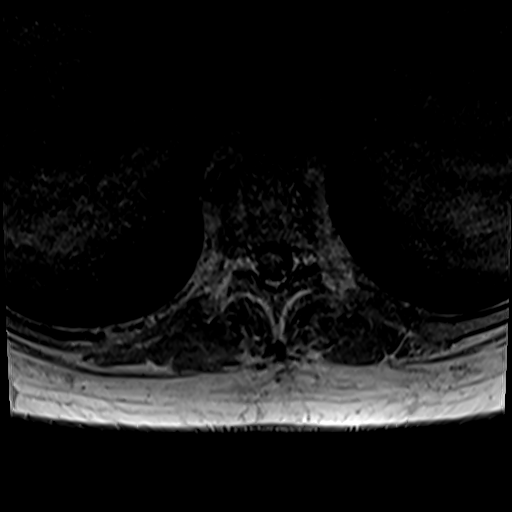
[im 24/36]
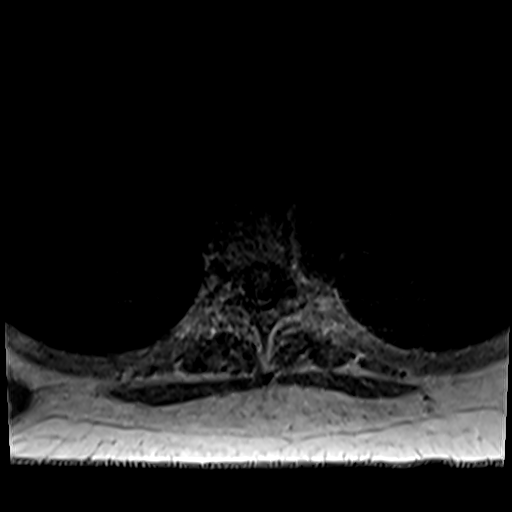
[im 36/36]
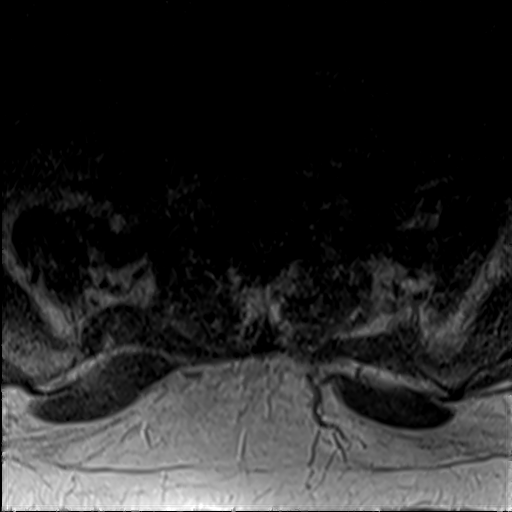

[Series 32: T2 · axial · 4.0mm · 0.78mm/px · z∈[-385,-132]mm · 4 of 36 slices shown (4 of 4)]
[im 1/36]
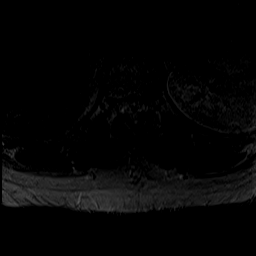
[im 12/36]
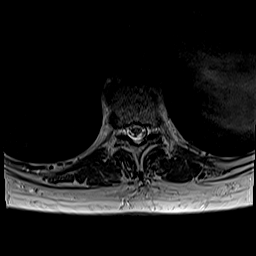
[im 24/36]
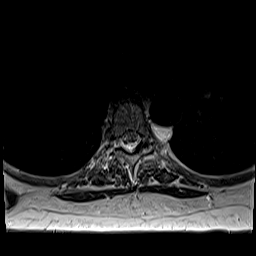
[im 36/36]
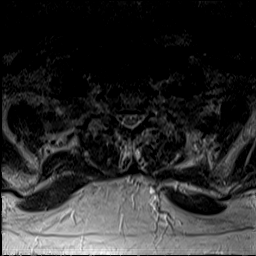

[Series 34: T1 fat-sat post-contrast · sagittal · 3.0mm · 0.69mm/px · 2 of 15 slices shown (1 of 3)]
[im 1/15]
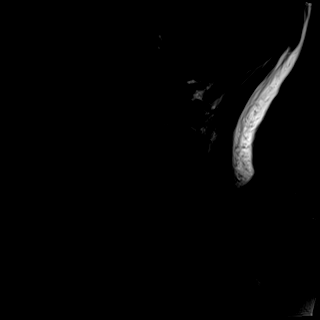
[im 15/15]
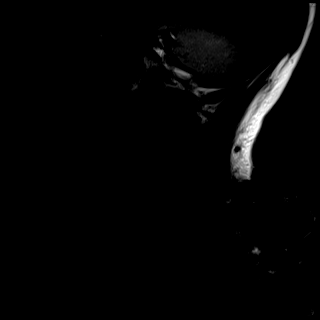

[Series 36: T1 fat-sat post-contrast · sagittal · 3.0mm · 1.00mm/px · 2 of 15 slices shown (2 of 3)]
[im 1/15]
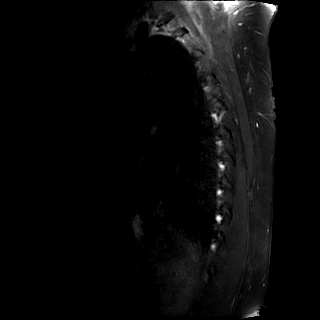
[im 15/15]
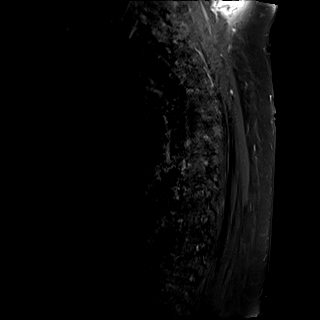

[Series 38: T1 fat-sat post-contrast · sagittal · 3.0mm · 0.69mm/px · 2 of 15 slices shown (3 of 3)]
[im 1/15]
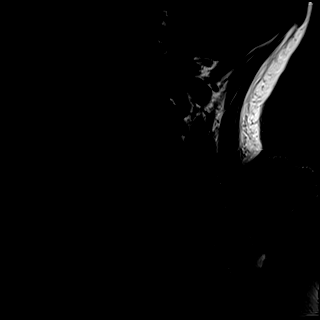
[im 15/15]
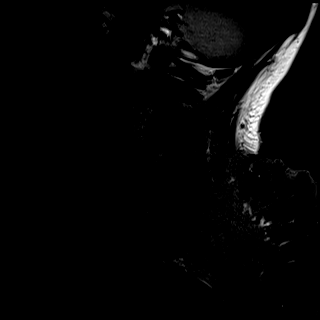

[31 of 48 positions shown; findings below may reference images not displayed]

FINDINGS: MRI CERVICAL SPINE FINDINGS

Alignment: Examination moderately to severely degraded by motion
artifact.

Straightening of the normal cervical lordosis.  No listhesis.

Vertebrae: Vertebral body height maintained without acute or chronic
fracture. Bone marrow signal intensity somewhat diffusely decreased
on T1 weighted imaging, nonspecific, but most commonly related to
anemia, smoking, or obesity. No visible discrete or worrisome
osseous lesions. No abnormal marrow edema or enhancement.

Cord: No convincing or obvious cord signal changes to suggest
demyelinating disease on this motion degraded exam. Overall cord
caliber and morphology is normal. No appreciable abnormal
enhancement.

Posterior Fossa, vertebral arteries, paraspinal tissues:
Craniocervical junction within normal limits. Paraspinous and
prevertebral soft tissues normal. Normal flow voids seen within the
vertebral arteries bilaterally.

Disc levels:

C2-C3: Unremarkable.

C3-C4: Mild disc bulge with left greater than right uncovertebral
hypertrophy. Flattening of the ventral thecal sac without
significant spinal stenosis or cord deformity. Moderate left C4
foraminal narrowing. Right neural foramina remains patent.

C4-C5: Mild disc bulge with uncovertebral hypertrophy. No spinal
stenosis. Foramina remain patent.

C5-C6: Mild disc bulge with uncovertebral hypertrophy. Mild
indentation of the ventral thecal sac without significant spinal
stenosis. Foramina remain patent.

C6-C7: Mild disc bulge with uncovertebral hypertrophy. Broad base
right paracentral disc osteophyte indents and partially effaces the
ventral thecal sac. Minimal flattening of the ventral cord with mild
spinal stenosis. Foramina remain patent.

C7-T1: Suspected mild facet hypertrophy. Normal interspace. No
stenosis.

MRI THORACIC SPINE FINDINGS

Alignment:  Examination severely degraded by motion artifact.

Vertebral bodies normally aligned with preservation of the normal
thoracic kyphosis. No listhesis.

Vertebrae: Probable endplate Schmorl's node deformities with mild
height loss seen at the superior endplates of T12 and L1. Associated
minimal reactive endplate edema at these levels. Vertebral body
height otherwise maintained. Underlying bone marrow signal intensity
within normal limits. No visible discrete or worrisome osseous
lesion. No other abnormal marrow edema or enhancement.

Cord: Signal intensity within the thoracic spinal cord is grossly
within normal limits on this motion degraded exam. No obvious or
convincing cord signal abnormality to suggest demyelinating disease.
No visible abnormal enhancement.

Paraspinal and other soft tissues: Paraspinous soft tissues grossly
within normal limits.

Disc levels:

T11-12: Disc desiccation with diffuse disc bulge. Superimposed
central disc protrusion indents the ventral thecal sac. Mild spinal
stenosis with mild flattening of the ventral cord. Foramina remain
patent.

T12-L1: Disc bulge with disc desiccation. Superimposed central disc
protrusion indents the ventral thecal sac, slightly eccentric to the
right. Mild spinal stenosis with mild cord flattening. Foramina
remain grossly patent.

No other significant disc pathology seen within the thoracic spine.
No other appreciable stenosis or impingement.
IMPRESSION: 1. Technically limited exam due to extensive motion artifact.
2. Grossly normal MRI appearance of the cervical and thoracic spinal
cord. No obvious cord signal abnormality to suggest demyelinating
disease. No abnormal enhancement.
3. Mild multilevel cervical spondylosis with resultant mild spinal
stenosis at C6-7.
4. Central disc protrusions at T11-12 and T12-L1 with resultant mild
spinal stenosis.

## 2022-06-19 NOTE — Progress Notes (Signed)
Cardiology Office Note:    Date:  06/20/2022   ID:  Lynn Roberts, DOB [birth year], MRN [id number]  PCP:  Patient, No Pcp Per [place] Cardiologist: Evalina Field, MD   Reason for visit: Palpitations  History of Present Illness:    Lynn Roberts is a 36 y.o. female with a hx of atrial tachycardia status post ablation, tobacco use, MS, weight loss surgery.  She was last seen by Dr. Audie Box 06/2020 who is seen for palpitations during pregnancy.  No arrhythmia detected on monitor.  Normal echo.  She has been seen for atypical chest pain which resolved.  Recommended to quit smoking.  She comes in today [redacted] weeks pregnant.  She mentions a a semirecent diagnosis of multiple sclerosis.  She is on a medication called Zeposia which you are not post to take during pregnancy.  When she found out she is pregnant, she stopped this medication.  She is having heart racing 2-3 times a day, last 30 seconds to a minute at a time..  She has some lightheadedness when bending over and standing back up.  She notes her blood pressures been a little bit lower while pregnant.  She states she hydrates pretty well.  No syncope.  No chest pain, shortness of breath, PND, orthopnea or lower extremity edema.  No other issues during this pregnancy.    Past Medical History:  Diagnosis Date   Arthritis of low back    Complication of anesthesia    is of [name] descent   Family history of adverse reaction to anesthesia    pt is a Lumbi [name]   GERD (gastroesophageal reflux disease)    none since pregnancy    Heart murmur    When pt. was pregnant with her last child, and also had murmur as a child.     Hemorrhoids    History of anal fissures    History of PAT (paroxysmal atrial tachycardia)    had cardiac ablation as a teenager   History of sleep apnea    none since 450 pound weight loss in 2015   Migraines    MS (multiple sclerosis) (HCC)    PCOD (polycystic ovarian disease)    no menses     Past Surgical History:  Procedure Laterality Date   BREAST LUMPECTOMY Left 2011   papilloma   BREAST LUMPECTOMY Left 2015   papilloma   BREAST LUMPECTOMY WITH RADIOACTIVE SEED LOCALIZATION Left 10/18/2014   Procedure:  RADIOACTIVE SEED GUIDED EXCISIONAL BREAST BIOPSY;  Surgeon: Alphonsa Overall, MD;  Location: [facility];  Service: General;  Laterality: Left;   BREAST SURGERY Right 07/27/2004   retro-areolar dissection with exc. of large duct   CARDIAC ELECTROPHYSIOLOGY MAPPING AND ABLATION  sge 13   CARDIAC ELECTROPHYSIOLOGY STUDY AND ABLATION  age 62   CESAREAN SECTION N/A 03/28/2020   Procedure: [place];  Surgeon: Deliah Boston, MD;  Location: [facility];  Service: Obstetrics;  Laterality: N/A;  RNFA [place]   CESAREAN SECTION  03/2018   CHOLECYSTECTOMY  04/27/2008   GASTROPLASTY DUODENAL SWITCH  01/2014   done at [place] N/A 08/15/2020   Procedure: ANORECTAL EXAM UNDER ANESTHESIA INCISION AND DRAINAGE OF PERIRECTAL ABSCESS x2 WITH PLACEMENT OF SETON;  Surgeon: Ileana Roup, MD;  Location: [facility];  Service: General;  Laterality: N/A;   OVARIAN CYST REMOVAL Bilateral 1999   PLACEMENT OF SETON  08/15/2020   PLACEMENT OF SETON  N/A 12/08/2020   Procedure: PLACEMENT OF CUTTING SETON; PARTIAL FISTULOTOMY;  Surgeon: Ileana Roup, MD;  Location: [facility];  Service: General;  Laterality: N/A;   RECTAL EXAM UNDER ANESTHESIA N/A 12/08/2020   Procedure: ANORECTAL EXAM UNDER ANESTHESIA;  Surgeon: Ileana Roup, MD;  Location: [facility];  Service: General;  Laterality: N/A;   TOENAIL EXCISION  yrs ago   UNILATERAL SALPINGECTOMY      Current Medications: Current Meds  Medication Sig   cholecalciferol (VITAMIN D3) 25 MCG (1000 UNIT) tablet Take 1,000 Units by mouth daily.   Copper Gluconate (COPPER CAPS) 2 MG CAPS Take 1 capsule by mouth daily.   cyanocobalamin (,VITAMIN B-12,)  1000 MCG/ML injection INJECT 1 ML INTO THE SKIN EVERY 4 WEEKS   ferrous sulfate 325 (65 FE) MG tablet Take 325 mg by mouth daily with breakfast.   Glatiramer Acetate (COPAXONE [place]) Inject 40 mg into the skin. Three times a week   Multiple Vitamin (MULTIVITAMIN WITH MINERALS) TABS tablet Take 1 tablet by mouth daily.   Prenat-FeCbn-FeAsp-Meth-FA-DHA (PRENATE MINI) 18-0.6-0.4-350 MG CAPS Take 1 capsule by mouth daily.   sertraline (ZOLOFT) 50 MG tablet Take 1 tablet (50 mg total) by mouth daily.     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   Caffeine use: 1 cup per day of tea   Social Determinants of Health   Financial Resource Strain: Not on file  Food Insecurity: Not on file  Transportation Needs: Not on file  Physical Activity: Not on file  Stress: Not on file  Social Connections: Not on file     Family History: The patient's family history includes Cancer in her maternal grandmother and paternal grandfather; Diabetes in her maternal grandfather and mother; Heart disease in her maternal grandmother; Hyperlipidemia in her father; Hypertension in her father and mother. There is no history of Colon cancer, Stomach cancer, Rectal cancer,  or Esophageal cancer.  ROS:   Please see the history of present illness.     EKGs/Labs/Other Studies Reviewed:    EKG:  The ekg ordered today demonstrates normal sinus rhythm, heart rate 64  Recent Labs: 06/20/2021: BUN 9; Creatinine, Ser 0.53; Potassium 4.4; Sodium 143 12/26/2021: ALT 11; Hemoglobin 12.4; Platelets 243   Recent Lipid Panel No results found for: "CHOL", "TRIG", "HDL", "LDLCALC", "LDLDIRECT"  Physical Exam:    VS:  BP 116/62 (BP Location: Left Arm, Patient Position: Sitting, Cuff Size: Large)   Pulse 64   Ht '5\' 11"'$  (1.803 m)   Wt  258 lb 9.6 oz (117.3 kg)   SpO2 94%   BMI 36.07 kg/m    No data found.       Wt Readings from Last 3 Encounters:  06/20/22 258 lb 9.6 oz (117.3 kg)  06/05/22 255 lb 8 oz (115.9 kg)  12/26/21 263 lb (119.3 kg)     GEN:  Well nourished, well developed in no acute distress HEENT: Normal NECK: No JVD; No carotid bruits CARDIAC: RRR, no murmurs, rubs, gallops RESPIRATORY:  Clear to auscultation without rales, wheezing or rhonchi  ABDOMEN: Soft, non-tender, non-distended MUSCULOSKELETAL: No edema; No deformity  SKIN: Warm and dry NEUROLOGIC:  Alert and oriented PSYCHIATRIC:  Normal affect    ASSESSMENT AND PLAN   Palpitations -History of atrial tachycardia status post ablation at age 40; symptoms then were fast heart rate and syncope -7 day Zio 10/2019 with normal sinus rhythm and less than 1% ectopy.  No A-fib.  Patient triggered events associated with normal sinus rhythm with rare PVCs. -2D echo 10/2019 with EF 60 to 65%, no valve disease -Ordered 2 weeks Zio patch to ensure no heart arrhythmia during pregnancy.  Tobacco use  -Recommend tobacco cessation.  Reviewed physiologic effects of nicotine and the immediate-eventual benefits of quitting including improvement in cough/breathing and reduction in cardiovascular events.  Discussed quitting tips such as removing triggers and getting support from family/friends and Quitline  [place]. -Referred to Pharm.D. for tobacco cessation help.  Disposition - Follow-up as needed.  We will follow-up on Zio patch results.   Medication Adjustments/Labs and Tests Ordered: Current medicines are reviewed at length with the patient today.  Concerns regarding medicines are outlined above.  Orders Placed This Encounter  Procedures   AMB Referral to [facility] Pharm-D   LONG TERM MONITOR (3-14 DAYS)   EKG 12-Lead   No orders of the defined types were placed in this encounter.   Patient Instructions  Medication Instructions:  No changes *If you need a refill on your cardiac medications before your next appointment, please call your pharmacy*   Lab Work: No labs If you have labs (blood work) drawn today and your tests are completely normal, you will receive your results only by: [place] (if you have MyChart) OR A paper copy in the mail If you have any lab test that is abnormal or we need to change your treatment, we will call you to review the results.   Testing/Procedures: Bryn Gull[place]- Long Term Monitor Instructions  Your physician has requested you wear a ZIO patch monitor for 14 days.  This is a single patch monitor. [employer] supplies one patch monitor per enrollment. Additional stickers are not available. Please do not apply patch if you will be having a Nuclear Stress Test,  Echocardiogram, Cardiac CT, MRI, or Chest Xray during the period you would be wearing the  monitor. The patch cannot be worn during these tests. You cannot remove and re-apply the  ZIO XT patch monitor.  Your ZIO patch monitor will be mailed 3 day USPS to your address on file. It may take 3-5 days  to receive your monitor after you have been enrolled.  Once you have received your monitor, please review the enclosed instructions. Your monitor  has already been registered assigning a specific monitor serial # to you.  Billing and Patient Assistance Program Information  We have supplied [employer]  with any of your insurance information on file for billing purposes. [employer] offers a sliding scale Patient Assistance Program for patients that  do not have  insurance, or whose insurance does not completely cover the cost of the ZIO monitor.  You must apply for the Patient Assistance Program to qualify for this discounted rate.  To apply, please call [employer] at [phone], select option 4, select option 2, ask to apply for  Patient Assistance Program. Theodore Demark will ask your household income, and how many people  are in your household. They will quote your out-of-pocket cost based on that information.  [employer] will also be able to set up a 47-month interest-free payment plan if needed.  Applying the monitor   Shave hair from upper left chest.  Hold abrader disc by orange tab. Rub abrader in 40 strokes over the upper left chest as  indicated in your monitor instructions.  Clean area with 4 enclosed alcohol pads. Let dry.  Apply patch as indicated in monitor instructions. Patch will be placed under collarbone on left  side of chest with arrow pointing upward.  Rub patch adhesive wings for 2 minutes. Remove white label marked "1". Remove the white  label marked "2". Rub patch adhesive wings for 2 additional minutes.  While looking in a mirror, press and release button in center of patch. A small green light will  flash 3-4 times. This will be your only indicator that the monitor has been turned on.  Do not shower for the first 24 hours. You may shower after the first 24 hours.  Press the button if you feel a symptom. You will hear a small click. Record Date, Time and  Symptom in the Patient Logbook.  When you are ready to remove the patch, follow instructions on the last 2 pages of Patient  Logbook. Stick patch monitor onto the last page of Patient Logbook.  Place Patient Logbook in the blue and white box. Use locking tab on box and tape box closed  securely. The blue and white box has  prepaid postage on it. Please place it in the mailbox as  soon as possible. Your physician should have your test results approximately 7 days after the  monitor [facility]  Call [place]at [phone] you have questions regarding  your ZIO XT patch monitor. Call them immediately if you see an orange light blinking on your  monitor.  If your monitor falls off in less than 4 days, contact our Monitor department at [phone]  If your monitor becomes loose or falls off after 4 days call [employer] at [phone]  suggestions on securing your monitor    Follow-Up: [facility] you and your health needs are our priority.  As part of our continuing mission to provide you with exceptional heart care, we have created designated Provider Care Teams.  These Care Teams include your primary Cardiologist (physician) and Advanced Practice Providers (APPs -  Physician Assistants and Nurse Practitioners) who all work together to provide you with the care you need, when you need it.  We recommend signing up for the patient portal called "MyChart".  Sign up information is provided on this After Visit Summary.  MyChart is used to connect with patients for Virtual Visits (Telemedicine).  Patients are able to view lab/test results, encounter notes, upcoming appointments, etc.  Non-urgent messages can be sent to your provider as well.   To learn more about what you can do with MyChart, go to [email or web address]    Your next appointment:   Follow up As Needed  The format for  your next appointment:   In Person  Provider:   Evalina Field, MD       Important Information About Sugar         Signed, Warren Lacy, PA-C  06/20/2022 11:00 AM    [place]

## 2022-06-20 ENCOUNTER — Encounter: Payer: Self-pay | Admitting: Hematology and Oncology

## 2022-06-20 ENCOUNTER — Ambulatory Visit (INDEPENDENT_AMBULATORY_CARE_PROVIDER_SITE_OTHER): Payer: 59 | Admitting: Physician Assistant

## 2022-06-20 ENCOUNTER — Encounter: Payer: Self-pay | Admitting: Physician Assistant

## 2022-06-20 ENCOUNTER — Ambulatory Visit (INDEPENDENT_AMBULATORY_CARE_PROVIDER_SITE_OTHER): Payer: 59

## 2022-06-20 VITALS — BP 116/62 | HR 64 | Ht 71.0 in | Wt 258.6 lb

## 2022-06-20 DIAGNOSIS — E669 Obesity, unspecified: Secondary | ICD-10-CM

## 2022-06-20 DIAGNOSIS — Z720 Tobacco use: Secondary | ICD-10-CM

## 2022-06-20 DIAGNOSIS — R002 Palpitations: Secondary | ICD-10-CM

## 2022-06-20 DIAGNOSIS — R072 Precordial pain: Secondary | ICD-10-CM

## 2022-06-20 NOTE — Patient Instructions (Signed)
Medication Instructions:  No changes *If you need a refill on your cardiac medications before your next appointment, please call your pharmacy*   Lab Work: No labs If you have labs (blood work) drawn today and your tests are completely normal, you will receive your results only by: [place] (if you have MyChart) OR A paper copy in the mail If you have any lab test that is abnormal or we need to change your treatment, we will call you to review the results.   Testing/Procedures: Bryn Gulling- Long Term Monitor Instructions  Your physician has requested you wear a ZIO patch monitor for 14 days.  This is a single patch monitor. [employer] supplies one patch monitor per enrollment. Additional stickers are not available. Please do not apply patch if you will be having a Nuclear Stress Test,  Echocardiogram, Cardiac CT, MRI, or Chest Xray during the period you would be wearing the  monitor. The patch cannot be worn during these tests. You cannot remove and re-apply the  ZIO XT patch monitor.  Your ZIO patch monitor will be mailed 3 day USPS to your address on file. It may take 3-5 days  to receive your monitor after you have been enrolled.  Once you have received your monitor, please review the enclosed instructions. Your monitor  has already been registered assigning a specific monitor serial # to you.  Billing and Patient Assistance Program Information  We have supplied [employer] with any of your insurance information on file for billing purposes. [employer] offers a sliding scale Patient Assistance Program for patients that do not have  insurance, or whose insurance does not completely cover the cost of the ZIO monitor.  You must apply for the Patient Assistance Program to qualify for this discounted rate.  To apply, please call [employer] at [phone], select option 4, select option 2, ask to apply for  Patient Assistance Program. Theodore Demark will ask your household income, and how many people   are in your household. They will quote your out-of-pocket cost based on that information.  [employer] will also be able to set up a 61-month interest-free payment plan if needed.  Applying the monitor   Shave hair from upper left chest.  Hold abrader disc by orange tab. Rub abrader in 40 strokes over the upper left chest as  indicated in your monitor instructions.  Clean area with 4 enclosed alcohol pads. Let dry.  Apply patch as indicated in monitor instructions. Patch will be placed under collarbone on left  side of chest with arrow pointing upward.  Rub patch adhesive wings for 2 minutes. Remove white label marked "1". Remove the white  label marked "2". Rub patch adhesive wings for 2 additional minutes.  While looking in a mirror, press and release button in center of patch. A small green light will  flash 3-4 times. This will be your only indicator that the monitor has been turned on.  Do not shower for the first 24 hours. You may shower after the first 24 hours.  Press the button if you feel a symptom. You will hear a small click. Record Date, Time and  Symptom in the Patient Logbook.  When you are ready to remove the patch, follow instructions on the last 2 pages of Patient  Logbook. Stick patch monitor onto the last page of Patient Logbook.  Place Patient Logbook in the blue and white box. Use locking tab on box and tape box closed  securely. The blue and white box  has prepaid postage on it. Please place it in the mailbox as  soon as possible. Your physician should have your test results approximately 7 days after the  monitor has been mailed back to [facility].  Call [place] at [phone] if you have questions regarding  your ZIO XT patch monitor. Call them immediately if you see an orange light blinking on your  monitor.  If your monitor falls off in less than 4 days, contact our Monitor department at [phone].  If your monitor becomes loose or  falls off after 4 days call [employer] at [phone] for  suggestions on securing your monitor    Follow-Up: At [facility], you and your health needs are our priority.  As part of our continuing mission to provide you with exceptional heart care, we have created designated Provider Care Teams.  These Care Teams include your primary Cardiologist (physician) and Advanced Practice Providers (APPs -  Physician Assistants and Nurse Practitioners) who all work together to provide you with the care you need, when you need it.  We recommend signing up for the patient portal called "MyChart".  Sign up information is provided on this After Visit Summary.  MyChart is used to connect with patients for Virtual Visits (Telemedicine).  Patients are able to view lab/test results, encounter notes, upcoming appointments, etc.  Non-urgent messages can be sent to your provider as well.   To learn more about what you can do with MyChart, go to [email or web address].    Your next appointment:   Follow up As Needed  The format for your next appointment:   In Person  Provider:   Evalina Field, MD       Important Information About Sugar

## 2022-06-20 NOTE — Progress Notes (Unsigned)
Enrolled for [employer] to mail a ZIO XT long term holter monitor to the patients address on file.   Dr. O'Neal to read.  

## 2022-06-21 ENCOUNTER — Encounter: Payer: Self-pay | Admitting: Hematology and Oncology

## 2022-06-23 DIAGNOSIS — Z720 Tobacco use: Secondary | ICD-10-CM

## 2022-06-23 DIAGNOSIS — R002 Palpitations: Secondary | ICD-10-CM | POA: Diagnosis not present

## 2022-06-24 ENCOUNTER — Telehealth: Payer: Self-pay

## 2022-06-24 ENCOUNTER — Other Ambulatory Visit: Payer: Self-pay | Admitting: Obstetrics and Gynecology

## 2022-06-24 DIAGNOSIS — Z3689 Encounter for other specified antenatal screening: Secondary | ICD-10-CM

## 2022-07-02 ENCOUNTER — Telehealth: Payer: Self-pay | Admitting: Neurology

## 2022-07-02 NOTE — Telephone Encounter (Signed)
[facility] Cyndi Bender) requesting refill for Whisperject autoinjector  Glatiramer Acetate (COPAXONE )

## 2022-07-02 NOTE — Telephone Encounter (Signed)
Called the pharmacy and provided a verbal order for the whisperject autoinjector. Once sent to the pharmacist Hetal, she states that the patient had one shipped out last month and she shouldn't need another one. I advised that would be correct but I was returning the call from [facility] who was calling and requesting a refill.  The pharmacist will look into this and make sure the patient got it and if not she placed this verbal order on file as an active hold to use if needed.

## 2022-07-03 ENCOUNTER — Ambulatory Visit: Payer: 59 | Attending: Obstetrics and Gynecology

## 2022-07-03 ENCOUNTER — Ambulatory Visit: Payer: 59 | Admitting: *Deleted

## 2022-07-03 ENCOUNTER — Ambulatory Visit (HOSPITAL_BASED_OUTPATIENT_CLINIC_OR_DEPARTMENT_OTHER): Payer: 59 | Admitting: Obstetrics and Gynecology

## 2022-07-03 ENCOUNTER — Other Ambulatory Visit: Payer: Self-pay | Admitting: *Deleted

## 2022-07-03 ENCOUNTER — Other Ambulatory Visit: Payer: Self-pay | Admitting: Obstetrics and Gynecology

## 2022-07-03 VITALS — BP 121/52 | HR 72

## 2022-07-03 DIAGNOSIS — G35 Multiple sclerosis: Secondary | ICD-10-CM

## 2022-07-03 DIAGNOSIS — O99351 Diseases of the nervous system complicating pregnancy, first trimester: Secondary | ICD-10-CM | POA: Diagnosis not present

## 2022-07-03 DIAGNOSIS — Z362 Encounter for other antenatal screening follow-up: Secondary | ICD-10-CM

## 2022-07-03 DIAGNOSIS — Z3A13 13 weeks gestation of pregnancy: Secondary | ICD-10-CM | POA: Diagnosis not present

## 2022-07-03 DIAGNOSIS — O34219 Maternal care for unspecified type scar from previous cesarean delivery: Secondary | ICD-10-CM | POA: Diagnosis not present

## 2022-07-03 DIAGNOSIS — Z3689 Encounter for other specified antenatal screening: Secondary | ICD-10-CM | POA: Diagnosis not present

## 2022-07-03 DIAGNOSIS — O09521 Supervision of elderly multigravida, first trimester: Secondary | ICD-10-CM

## 2022-07-03 DIAGNOSIS — O99211 Obesity complicating pregnancy, first trimester: Secondary | ICD-10-CM | POA: Diagnosis not present

## 2022-07-03 DIAGNOSIS — O09891 Supervision of other high risk pregnancies, first trimester: Secondary | ICD-10-CM | POA: Diagnosis not present

## 2022-07-03 DIAGNOSIS — O09522 Supervision of elderly multigravida, second trimester: Secondary | ICD-10-CM | POA: Insufficient documentation

## 2022-07-03 DIAGNOSIS — O99212 Obesity complicating pregnancy, second trimester: Secondary | ICD-10-CM

## 2022-07-03 NOTE — Progress Notes (Signed)
Maternal-Fetal Medicine   Name: Lynn Roberts DOB: Jan 30, 1986 MRN: [id number] Referring Provider: Eula Flax, MD  I had the pleasure of seeing [name] [facility] at [facility]. She is G4 P2012 at [place] gestation and is here for first-trimester ultrasound evaluation and consultation. Her problems include: -Multiple sclerosis.  Diagnosed in March 2022 when patient lost vision in her left eye.  She was admitted to [facility] and had steroid treatment.  MRI showed evidence of multiple sclerosis.  Patient was taking Zeposia (ozanimod) till June 2023 when she discontinued the medication because of discovery of her pregnancy.  She now takes Copaxone injections 3 times weekly.  Patient reports she had a repeat MRI in March 2023 that showed some new lesions.  Her symptoms have improved with treatment.  She does not have respiratory symptoms.  Patient does not have hypertension or diabetes.  She had paroxysmal atrial tachycardia at age 22 and had ablation.  No history of recurrence. -Bariatric surgery (duodenal switch).  Patient reports she lost 450 pounds after surgery. -Advanced maternal age.  Patient will be undergoing cell-free fetal DNA screening at your office. -Previous cesarean delivery. -Cigarette smoking.  Patient smokes half pack per day.  Past surgical history: Bariatric surgery, cesarean section, laparoscopic cholecystectomy, breast lumpectomy, unilateral salpingectomy, anal fistula repair. Medications: Copaxone  (glatiramer acetate) 40 mg inj three times weekly, prenatal vitamins, vitamin D supplements. Allergies: NSAID, Tramadol, sulfa drugs (all causing rashes). Social history: Smokes half packet of cigarettes daily.  No alcohol or drug use.  Obstetric history 03/2018: Preterm cesarean delivery at [redacted] weeks gestation.  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  He did not have Down syndrome. 03/2020: Term  cesarean delivery of a female infant weighing 5 pounds and 5 ounces at birth.  Her pregnancy was complicated by placenta previa.  Ultrasound We performed a transabdominal and transvaginal ultrasound to evaluate the pregnancy.  The CRL measurement is consistent with the previously established dates and good fetal heart activity seen.  Fetal anatomical survey that could be ascertained at this gestational age appears normal.  No evidence of cystic hygroma. Placenta is posterior and there is no clear evidence of previa or placenta accreta spectrum.  Multiple sclerosis in pregnancy - Pregnancy seems to confer some protection in reducing relapse rates (PRIMS study) and there is a significant reduction in relapse in the third trimester. About 25% of women have postpartum relapses are more common. -Pregnancy does NOT influence long-term course (disability outcomes) of MS. Pregnancy also does not affect the type or severity of exacerbation.  -MS does not increase adverse pregnancy outcomes including preterm delivery, preeclampsia, or fetal growth restriction. Patient does not have bladder symptoms that are likely to be exacerbated in pregnancy in some women. -Vitamin D3 supplements are recommended. Patient takes cholecalciferol. -MRI in pregnancy is not contraindicated. Gadolinium dye is avoided but may be given if MRI is necessary for diagnosis. -Prenatal and obstetric management is not influenced by MS and routine care should continue. Vaginal delivery is the goal and cesarean section should be performed only for obstetric indications. -Epidural analgesia may be safely given. In severe cases with spasticity, epidural analgesia may be helpful. Anesthesiologist may be informed in advance if induction of labor is planned. Treatment of MS -Disease-modifying drugs are the mainstay of treatment of MS. Copaxone (glatiramer acetate or GA) has been approved for use in pregnant women (category B) and no increased  adverse  effects are reported. Other drugs include Interferon-beta (IFN-B) and natalizumab (Tysabri). I will not be discussing the drugs that are contraindicated in pregnancy and are of concern only in preconception counseling. -In general, pregnant women should not be deprived of treatment because of pregnancy concerns. Falling relapse rates in pregnancy does not mean that long-term progression is in pause.  -Natalizumab (Tysabri) is considered in patients with severe MS. If considered for our patient, it can be continued in pregnancy and preferably stopped at 34 weeks for about 8 weeks (till after delivery). Self-limiting hematological abnormalities are reported in some newborns. Methylprednisone can be safely given in pregnancy if severe exacerbation requires steroid treatment. Zeposia (ozanimod) is not to be taken during pregnancy. We do not have enough information on its use in pregnancy. Animal studies showed embryotoxic effects because of the adverse effect of this drug on the receptors. The drug levels stay longer after discontinuation. I reassured the patient that today's ultrasound is reassuring. However, ultrasound has limitations in detecting all anomalies. About 2% to 3% of all pregnancies are complicated by fetal congenital malformations.  We will perform a detailed fetal anatomical survey at [redacted] weeks gestation. I gave patient the contact number of drug company [employer]) to register.  She had already contacted them. I encouraged the patient to continue follow-up with her neurologist to discuss MS and treatment.  (Reference on treatment: [place] Consensus on pregnancy with multiple sclerosis. Pract Neurol 1443;15:[id number]). Bariatric surgery Patient reports she lost 450 pounds after surgery.  Theatric surgery reduces the incidence of gestational diabetes and gestational hypertension/preeclampsia it can, however, because growth restriction in some fetuses and we recommend serial fetal growth  assessments.  Advanced maternal age 36 maternal age is associated with increased risk of fetal chromosomal anomalies including Down syndrome.  I discussed the significance and limitations of cell free fetal DNA screening and the patient is planning to have at your office next week.  Cigarette smoking I discussed nicotine replacement therapy (NRT) that that has shown in increasing quit rates.  Nicotine patches may be tried with a starting dose of 21 mg and gradually taper it to a maintenance dose of 7 mg.  Patient will discuss with you about NRT. Previous cesarean delivery Repeat cesarean deliveries increase the risk of placenta previa or placenta accreta spectrum.  Patient is planning to have bilateral tubal ligation with cesarean delivery.  Recommendations -Cell free fetal DNA screening. -Low-dose aspirin (81 mg) daily from next week till delivery. -An appointment was made for her to return in 7 weeks for detailed fetal anatomical survey. -Fetal growth assessments every 4 weeks till delivery. -Weekly BPP from 32 over [redacted] weeks gestation till delivery. -Continue Copaxone and follow-up with her neurologist. -Nicotine replacement therapy to be considered. -Repeat cesarean delivery at [redacted] weeks gestation provided antenatal testing remains reassuring.  Thank you for consultation.  If you have any questions or concerns, please contact me the [facility].  Consultation including face-to-face (more than 50%) counseling 50 minutes.

## 2022-07-05 DIAGNOSIS — O09522 Supervision of elderly multigravida, second trimester: Secondary | ICD-10-CM | POA: Diagnosis not present

## 2022-07-05 DIAGNOSIS — O09521 Supervision of elderly multigravida, first trimester: Secondary | ICD-10-CM | POA: Diagnosis not present

## 2022-07-10 ENCOUNTER — Encounter: Payer: Self-pay | Admitting: Pharmacist Clinician (PhC)/ Clinical Pharmacy Specialist

## 2022-07-10 ENCOUNTER — Ambulatory Visit (INDEPENDENT_AMBULATORY_CARE_PROVIDER_SITE_OTHER): Payer: 59 | Admitting: Pharmacist Clinician (PhC)/ Clinical Pharmacy Specialist

## 2022-07-10 ENCOUNTER — Ambulatory Visit: Payer: 59 | Admitting: Neurology

## 2022-07-10 DIAGNOSIS — Z720 Tobacco use: Secondary | ICD-10-CM | POA: Diagnosis not present

## 2022-07-10 MED ORDER — NICOTINE 7 MG/24HR TD PT24: 7.0000 mg | MEDICATED_PATCH | Freq: Every day | TRANSDERMAL | 1 refills | Status: DC

## 2022-07-10 MED ORDER — NICOTINE 14 MG/24HR TD PT24: 14.0000 mg | MEDICATED_PATCH | Freq: Every day | TRANSDERMAL | 1 refills | Status: DC

## 2022-07-10 NOTE — Progress Notes (Signed)
07/10/2022 Lynn Roberts 09-Aug-1986 [id number]   HPI:  Lynn Roberts is a 36 y.o. female patient of Dr Audie Box, with a PMH below who presents today for smoking cessation options.  Patient is currently pregnant ([redacted]w[redacted]d.  She currently has 2 children (4 and 2) and admits to smoking throughout both pregnancies.  Currently smokes 1/4 - 1/2 ppd (5-10 cigarettes), but can easily go 4-5 hours between cigarettes.  Has first cigarette when she wakes in the morning.  Is able to work without cravings, but will smoke on her lunch break and then as soon as work is over.  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  Her husband also smokes, about 1/2 ppd.  They do not smoke in the house, but rather go out onto patio.    In the past she has tried multiple times to quit, with no success.  She has tried Chantix, patches and gum.  Didn't like the way she felt on Chantix and the gum tasted bad.  Would not recommend bupropion as she is pregnant.    Past Medical History: Atrial tachycardia S/P ablation  at age 36 MS On Copaxone  obesity Bariatric surgery - down 150 lbs since surgery in 2015    Current Medications: none  Family Hx: parents healthy, both with hypertension; only child, kids are 2,4  Social Hx: no alcohol, sweet tea regularly   Wt Readings from Last 3 Encounters:  06/20/22 258 lb 9.6 oz (117.3 kg)  06/05/22 255 lb 8 oz (115.9 kg)  12/26/21 263 lb (119.3 kg)   BP Readings from Last 3 Encounters:  07/03/22 (!) 121/52  06/20/22 116/62  06/05/22 131/72   Pulse Readings from Last 3 Encounters:  07/03/22 72  06/20/22 64  06/05/22 69    Current Outpatient Medications  Medication Sig Dispense Refill   nicotine (NICODERM CQ - DOSED IN MG/24 HOURS) 14 mg/24hr patch Place 1 patch (14 mg total) onto the skin daily. 28 patch 1   nicotine (NICODERM CQ) 7 mg/24hr patch Place 1 patch (7 mg total) onto the skin daily. 28 patch 1   cholecalciferol (VITAMIN D3) 25 MCG  (1000 UNIT) tablet Take 1,000 Units by mouth daily.     Copper Gluconate (COPPER CAPS) 2 MG CAPS Take 1 capsule by mouth daily.     cyanocobalamin (,VITAMIN B-12,) 1000 MCG/ML injection INJECT 1 ML INTO THE SKIN EVERY 4 WEEKS 3 mL 1   ferrous sulfate 325 (65 FE) MG tablet Take 325 mg by mouth daily with breakfast.     Glatiramer Acetate (COPAXONE [place]) Inject 40 mg into the skin. Three times a week     Multiple Vitamin (MULTIVITAMIN WITH MINERALS) TABS tablet Take 1 tablet by mouth daily. 30 tablet 0   Prenat-FeCbn-FeAsp-Meth-FA-DHA (PRENATE MINI) 18-0.6-0.4-350 MG CAPS Take 1 capsule by mouth daily.     sertraline (ZOLOFT) 50 MG tablet Take 1 tablet (50 mg total) by mouth daily. 90 tablet 3   No current facility-administered medications for this visit.    Allergies  Allergen Reactions   Nsaids Other (See Comments)    HX. OF WEIGHT-LOSS SURGERY    Other Rash    Reaction to LOTIONS    Sulfa Antibiotics Rash   Tramadol Rash    Had after 2014 and tolerated okay    Past Medical History:  Diagnosis Date   Arthritis of low back    Complication of anesthesia    is  of [name] descent   Family history of adverse reaction to anesthesia    pt is a Lumb[name]   GERD (gastroesophageal reflux disease)    none since pregnancy    Heart murmur    When pt. was pregnant with her last child, and also had murmur as a child.     Hemorrhoids    History of anal fissures    History of PAT (paroxysmal atrial tachycardia)    had cardiac ablation as a teenager   History of sleep apnea    none since 450 pound weight loss in 2015   Migraines    MS (multiple sclerosis) (HCC)    PCOD (polycystic ovarian disease)    no menses    Last menstrual period 03/29/2022.  Tobacco abuse Patient currently smoking up to 1/2 ppd and pregnant - expecting in early February.  Had long discussion about options for smoking cessation including Chantix and nicotine replacement products (patches, gum, lozenges).   Nicotine replacement is preferred as she is expecting.  Reviewed options and she prefers to use the patches.  Because she is </= to 10 cigarettes per day, will start her on the 14 mg patches.  She can use for 6 weeks (or 8 at her discretion, then drop to the 7 mg patches for another 4-8 weeks.  Counselled on proper application of patches as well as disposal (as she has small children in the house).  I will share her information with our [place] to see if she can offer more support to Ms Lynn Roberts.     Tommy Medal PharmD CPP [facility] [address] [place], [place] [address] [phone]

## 2022-07-10 NOTE — Patient Instructions (Signed)
Start nicotine patches 14 mg daily for 6-8 weeks, then decrease to 7 mg patches.  I will share your name with Avelino Leeds, our Health Coach, to see if she can offer you any further advice or support.    If you have any other questions or concerns, please feel free to send me a message - My Chart is the best way to get me - Tommy Medal PharmD

## 2022-07-10 NOTE — Assessment & Plan Note (Signed)
Patient currently smoking up to 1/2 ppd and pregnant - expecting in early February.  Had long discussion about options for smoking cessation including Chantix and nicotine replacement products (patches, gum, lozenges).  Nicotine replacement is preferred as she is expecting.  Reviewed options and she prefers to use the patches.  Because she is </= to 10 cigarettes per day, will start her on the 14 mg patches.  She can use for 6 weeks (or 8 at her discretion, then drop to the 7 mg patches for another 4-8 weeks.  Counselled on proper application of patches as well as disposal (as she has small children in the house).  I will share her information with our [place] to see if she can offer more support to Ms Lynn Roberts.

## 2022-07-13 ENCOUNTER — Other Ambulatory Visit: Payer: Self-pay

## 2022-07-16 DIAGNOSIS — R002 Palpitations: Secondary | ICD-10-CM | POA: Diagnosis not present

## 2022-07-26 ENCOUNTER — Other Ambulatory Visit: Payer: 59

## 2022-07-26 ENCOUNTER — Ambulatory Visit: Payer: 59

## 2022-07-30 ENCOUNTER — Telehealth: Payer: Self-pay

## 2022-07-30 NOTE — Telephone Encounter (Signed)
Spoke with pt. Pt was notified of monitor results. Pt voiced understanding and will f/u as planned.

## 2022-08-14 DIAGNOSIS — Z363 Encounter for antenatal screening for malformations: Secondary | ICD-10-CM | POA: Diagnosis not present

## 2022-08-14 DIAGNOSIS — O99212 Obesity complicating pregnancy, second trimester: Secondary | ICD-10-CM | POA: Diagnosis not present

## 2022-08-14 DIAGNOSIS — Z8679 Personal history of other diseases of the circulatory system: Secondary | ICD-10-CM | POA: Diagnosis not present

## 2022-08-14 DIAGNOSIS — G35 Multiple sclerosis: Secondary | ICD-10-CM | POA: Diagnosis not present

## 2022-08-14 DIAGNOSIS — O99332 Smoking (tobacco) complicating pregnancy, second trimester: Secondary | ICD-10-CM | POA: Diagnosis not present

## 2022-08-14 DIAGNOSIS — Z3A19 19 weeks gestation of pregnancy: Secondary | ICD-10-CM | POA: Diagnosis not present

## 2022-08-14 DIAGNOSIS — O3429 Maternal care due to uterine scar from other previous surgery: Secondary | ICD-10-CM | POA: Diagnosis not present

## 2022-08-16 ENCOUNTER — Encounter: Payer: Self-pay | Admitting: Pharmacist Clinician (PhC)/ Clinical Pharmacy Specialist

## 2022-08-18 MED ORDER — NICOTINE POLACRILEX 2 MG MT GUM: 2.0000 mg | CHEWING_GUM | OROMUCOSAL | 1 refills | Status: DC | PRN

## 2022-08-21 ENCOUNTER — Ambulatory Visit: Payer: 59 | Admitting: *Deleted

## 2022-08-21 ENCOUNTER — Ambulatory Visit: Payer: 59 | Attending: Obstetrics and Gynecology

## 2022-08-21 ENCOUNTER — Other Ambulatory Visit: Payer: Self-pay | Admitting: Neurology

## 2022-08-21 ENCOUNTER — Other Ambulatory Visit: Payer: Self-pay | Admitting: *Deleted

## 2022-08-21 ENCOUNTER — Ambulatory Visit (HOSPITAL_BASED_OUTPATIENT_CLINIC_OR_DEPARTMENT_OTHER): Payer: 59 | Admitting: Maternal & Fetal Medicine

## 2022-08-21 VITALS — BP 115/62 | HR 63

## 2022-08-21 DIAGNOSIS — O99212 Obesity complicating pregnancy, second trimester: Secondary | ICD-10-CM | POA: Diagnosis not present

## 2022-08-21 DIAGNOSIS — Z362 Encounter for other antenatal screening follow-up: Secondary | ICD-10-CM

## 2022-08-21 DIAGNOSIS — G35 Multiple sclerosis: Secondary | ICD-10-CM

## 2022-08-21 DIAGNOSIS — Z79899 Other long term (current) drug therapy: Secondary | ICD-10-CM

## 2022-08-21 DIAGNOSIS — Z364 Encounter for antenatal screening for fetal growth retardation: Secondary | ICD-10-CM

## 2022-08-21 DIAGNOSIS — Z9884 Bariatric surgery status: Secondary | ICD-10-CM

## 2022-08-21 DIAGNOSIS — O365990 Maternal care for other known or suspected poor fetal growth, unspecified trimester, not applicable or unspecified: Secondary | ICD-10-CM

## 2022-08-21 NOTE — Progress Notes (Signed)
MFM Brief Note  Ms. Lynn Roberts is a 36 yo G14P1 who is seen 20 w 5 d with an EDD fo 01/03/23. She is seen today at the request of Dr. Eula Flax.  Ms. Lynn Roberts was previously seen in consultation at our office by Dr. Donalee Citrin (please see note for detail).    Her pregnancy is complicated by multiple sclerosis, elevate BMI with history of gastric bypass.  Her MS is treated with Copaxone which is not known to cause fetal defects or fetal growth delays.  She is without MS symptoms at this time.   Single intrauterine pregnancy was identified. A detailed anatomy  Normal detailed  anatomy with measurements consistent with fetal growth restriction (EFW 6th%) There is good fetal movement and amniotic fluid volume Suboptimal views of the fetal anatomy were obtained secondary to fetal position.  I discussed today's visit with a diagnosis of IUGR. I explained that the etiology includes placental insufficiency, chronic disease, infection, aneuploidy and other genetic syndromes. She has a low risk NIPS and neg AFP.  At this time I explained the diagnosis, evaluation and management to include serial fetal growth and weekly antenatal testing to include UA Dopplers.   If the EFW < 3rd% or abnormal testing, I recommend delivery at 37 weeks otherwise if normal consider delivery at 39 weeks.   We have scheduled Ms. Lynn Roberts to return in 2 weeks for UA dopplers and growth in 3 weeks.   I spent 20 minutes with > 50% in face to face consultation.  Lynn Roberts,

## 2022-08-28 DIAGNOSIS — Z3A21 21 weeks gestation of pregnancy: Secondary | ICD-10-CM | POA: Diagnosis not present

## 2022-08-28 DIAGNOSIS — O359XX1 Maternal care for (suspected) fetal abnormality and damage, unspecified, fetus 1: Secondary | ICD-10-CM | POA: Diagnosis not present

## 2022-09-04 ENCOUNTER — Ambulatory Visit: Payer: 59 | Attending: Maternal & Fetal Medicine

## 2022-09-04 ENCOUNTER — Ambulatory Visit: Payer: 59 | Admitting: *Deleted

## 2022-09-04 VITALS — BP 112/59 | HR 60

## 2022-09-04 DIAGNOSIS — G35 Multiple sclerosis: Secondary | ICD-10-CM | POA: Insufficient documentation

## 2022-09-04 DIAGNOSIS — Z3689 Encounter for other specified antenatal screening: Secondary | ICD-10-CM | POA: Insufficient documentation

## 2022-09-04 DIAGNOSIS — O365990 Maternal care for other known or suspected poor fetal growth, unspecified trimester, not applicable or unspecified: Secondary | ICD-10-CM | POA: Diagnosis not present

## 2022-09-04 DIAGNOSIS — Z9884 Bariatric surgery status: Secondary | ICD-10-CM | POA: Diagnosis not present

## 2022-09-04 DIAGNOSIS — Z362 Encounter for other antenatal screening follow-up: Secondary | ICD-10-CM | POA: Insufficient documentation

## 2022-09-07 ENCOUNTER — Other Ambulatory Visit: Payer: Self-pay

## 2022-09-07 ENCOUNTER — Emergency Department (HOSPITAL_COMMUNITY)
Admission: EM | Admit: 2022-09-07 | Discharge: 2022-09-07 | Disposition: A | Discharge status: Home / Self Care | Payer: 59 | Attending: Emergency Medicine | Admitting: Emergency Medicine

## 2022-09-07 ENCOUNTER — Encounter (HOSPITAL_COMMUNITY): Payer: Self-pay | Admitting: Emergency Medicine

## 2022-09-07 DIAGNOSIS — N719 Inflammatory disease of uterus, unspecified: Secondary | ICD-10-CM | POA: Diagnosis not present

## 2022-09-07 DIAGNOSIS — O2392 Unspecified genitourinary tract infection in pregnancy, second trimester: Secondary | ICD-10-CM | POA: Diagnosis not present

## 2022-09-07 DIAGNOSIS — Z3A23 23 weeks gestation of pregnancy: Secondary | ICD-10-CM | POA: Diagnosis not present

## 2022-09-07 DIAGNOSIS — K610 Anal abscess: Secondary | ICD-10-CM | POA: Diagnosis not present

## 2022-09-07 DIAGNOSIS — L0291 Cutaneous abscess, unspecified: Secondary | ICD-10-CM

## 2022-09-07 MED ORDER — CLINDAMYCIN HCL 300 MG PO CAPS: 300.0000 mg | ORAL_CAPSULE | Freq: Four times a day (QID) | ORAL | 0 refills | Status: DC

## 2022-09-07 NOTE — ED Provider Notes (Signed)
[facility] EMERGENCY DEPARTMENT Provider Note   CSN: [id number] Arrival date & time: 09/07/22  1626     History  Chief Complaint  Patient presents with   Abscess    Lynn Roberts is a 36 y.o. female.   Abscess    Patient currently [redacted] weeks gestational age presents today due to abscess to left uterus/perianally.  Started 2 days ago, started having drainage earlier today.  It is painful, she is able to use the restroom without difficulty.  Denies any fevers or chills.  History of the same 2 years ago which required surgical intervention.  No abdominal pain or vaginal bleeding.  Home Medications Prior to Admission medications   Medication Sig Start Date End Date Taking? Authorizing Provider  clindamycin (CLEOCIN) 300 MG capsule Take 1 capsule (300 mg total) by mouth 4 (four) times daily. 09/07/22  Yes Sherrill Raring, PA-C  cholecalciferol (VITAMIN D3) 25 MCG (1000 UNIT) tablet Take 1,000 Units by mouth daily.    [provider]  Copper Gluconate (COPPER CAPS) 2 MG CAPS Take 1 capsule by mouth daily.    [provider]  cyanocobalamin (VITAMIN B12) 1000 MCG/ML injection INJECT 1 ML INTO THE SKIN EVERY 4 WEEKS 08/21/22   Sater, Nanine Means, MD  ferrous sulfate 325 (65 FE) MG tablet Take 325 mg by mouth daily with breakfast.    [provider]  Glatiramer Acetate (COPAXONE [place]) Inject 40 mg into the skin. Three times a week    [provider]  Multiple Vitamin (MULTIVITAMIN WITH MINERALS) TABS tablet Take 1 tablet by mouth daily. 02/26/21   Little Ishikawa, MD  nicotine (NICODERM CQ - DOSED IN MG/24 HOURS) 14 mg/24hr patch Place 1 patch (14 mg total) onto the skin daily. 07/10/22   O'Neal, Cassie Freer, MD  nicotine (NICODERM CQ) 7 mg/24hr patch Place 1 patch (7 mg total) onto the skin daily. 07/10/22   O'NealCassie Freer, MD  nicotine polacrilex (NICORETTE) 2 MG gum Take 1 each (2 mg total) by mouth as needed for smoking cessation.  08/18/22   O'NealCassie Freer, MD  Prenat-FeCbn-FeAsp-Meth-FA-DHA (PRENATE MINI) 18-0.6-0.4-350 MG CAPS Take 1 capsule by mouth daily. 05/30/22   [provider]  sertraline (ZOLOFT) 50 MG tablet Take 1 tablet (50 mg total) by mouth daily. 12/26/21   Sater, Nanine Means, MD      Allergies    Nsaids, Other, Sulfa antibiotics, and Tramadol    Review of Systems   Review of Systems  Physical Exam Updated Vital Signs BP 115/82 (BP Location: Right Arm)   Pulse 73   Temp 98.1 F (36.7 C) (Oral)   Resp 18   LMP 03/29/2022   SpO2 99%  Physical Exam Vitals and nursing note reviewed. Exam conducted with a chaperone present.  Constitutional:      General: She is not in acute distress.    Appearance: Normal appearance.  HENT:     Head: Normocephalic and atraumatic.  Eyes:     General: No scleral icterus.    Extraocular Movements: Extraocular movements intact.     Pupils: Pupils are equal, round, and reactive to light.  Genitourinary:      Comments: Surrounding erythema, induration does not extend into the rectum. Skin:    Coloration: Skin is not jaundiced.  Neurological:     Mental Status: She is alert. Mental status is at baseline.     Coordination: Coordination normal.     ED Results / Procedures /  Treatments   Labs (all labs ordered are listed, but only abnormal results are displayed) Labs Reviewed - No data to display  EKG None  Radiology No results found.  Procedures Procedures    Medications Ordered in ED Medications - No data to display  ED Course/ Medical Decision Making/ A&P                           Medical Decision Making Risk Prescription drug management.   Patient presents due to abscess perianal.  Afebrile, does not appear septic.  No abdominal pain or vaginal bleeding or symptoms at suggestive of additional pregnancy related complication.  Abscess is openly draining,  do not think any additional I&D is indicated.  Will cover with  clindamycin, I called pharmacy further recommendations regarding antibiotics given patient is [redacted] weeks pregnant and has a sulfa allergy, they advised clindamycin 300 4 times daily for a week with close return precautions.  Discussed HPI, physical exam and plan of care for this patient with attending [facility]. The attending physician evaluated this patient as part of a shared visit and agrees with plan of care.         Final Clinical Impression(s) / ED Diagnoses Final diagnoses:  Abscess    Rx / DC Orders ED Discharge Orders          Ordered    clindamycin (CLEOCIN) 300 MG capsule  4 times daily        09/07/22 2036              Sherrill Raring, PA-C 09/07/22 2041    Blanchie Dessert, MD 09/08/22 1233

## 2022-09-07 NOTE — ED Triage Notes (Signed)
Patient here who is [redacted] wks pregnant with a hx of anal abscesses 2 years ago that were so severe that she had to have emergency surgery for. Patient here today stating she feels as though another anal abscess has formed in the same location. While at work she bent over and felt it pop with drainage down her legs but still feels like it has not fully drained. Aox4.

## 2022-09-07 NOTE — Discharge Instructions (Addendum)
You are seen today in the emergency department for an abscess.  Do warm soaks and to help the drainage come out.  Take the clindamycin 4 times daily for the next week.  That should be every 6 hours.  If you have difficulties with active, fevers, worsening symptoms need to return to the ED for evaluation.

## 2022-09-11 ENCOUNTER — Ambulatory Visit: Payer: 59 | Admitting: *Deleted

## 2022-09-11 ENCOUNTER — Ambulatory Visit: Payer: 59 | Attending: Maternal & Fetal Medicine

## 2022-09-11 DIAGNOSIS — G35 Multiple sclerosis: Secondary | ICD-10-CM | POA: Diagnosis not present

## 2022-09-11 DIAGNOSIS — G35D Multiple sclerosis, unspecified: Secondary | ICD-10-CM

## 2022-09-11 DIAGNOSIS — Z9884 Bariatric surgery status: Secondary | ICD-10-CM | POA: Diagnosis not present

## 2022-09-11 DIAGNOSIS — Z362 Encounter for other antenatal screening follow-up: Secondary | ICD-10-CM

## 2022-09-11 DIAGNOSIS — O365990 Maternal care for other known or suspected poor fetal growth, unspecified trimester, not applicable or unspecified: Secondary | ICD-10-CM

## 2022-09-12 ENCOUNTER — Other Ambulatory Visit: Payer: Self-pay | Admitting: *Deleted

## 2022-09-12 DIAGNOSIS — O365990 Maternal care for other known or suspected poor fetal growth, unspecified trimester, not applicable or unspecified: Secondary | ICD-10-CM

## 2022-09-16 ENCOUNTER — Encounter: Payer: Self-pay | Admitting: Neurology

## 2022-09-25 ENCOUNTER — Ambulatory Visit: Payer: 59

## 2022-10-02 ENCOUNTER — Ambulatory Visit: Payer: 59 | Admitting: *Deleted

## 2022-10-02 ENCOUNTER — Ambulatory Visit (HOSPITAL_BASED_OUTPATIENT_CLINIC_OR_DEPARTMENT_OTHER): Payer: 59 | Admitting: *Deleted

## 2022-10-02 ENCOUNTER — Other Ambulatory Visit: Payer: Self-pay | Admitting: *Deleted

## 2022-10-02 ENCOUNTER — Ambulatory Visit: Payer: 59 | Attending: Maternal & Fetal Medicine

## 2022-10-02 VITALS — BP 115/63 | HR 63

## 2022-10-02 DIAGNOSIS — O09292 Supervision of pregnancy with other poor reproductive or obstetric history, second trimester: Secondary | ICD-10-CM

## 2022-10-02 DIAGNOSIS — Z3A26 26 weeks gestation of pregnancy: Secondary | ICD-10-CM

## 2022-10-02 DIAGNOSIS — Z3689 Encounter for other specified antenatal screening: Secondary | ICD-10-CM | POA: Diagnosis not present

## 2022-10-02 DIAGNOSIS — G35 Multiple sclerosis: Secondary | ICD-10-CM | POA: Diagnosis not present

## 2022-10-02 DIAGNOSIS — Z9884 Bariatric surgery status: Secondary | ICD-10-CM | POA: Insufficient documentation

## 2022-10-02 DIAGNOSIS — E669 Obesity, unspecified: Secondary | ICD-10-CM | POA: Diagnosis not present

## 2022-10-02 DIAGNOSIS — O99212 Obesity complicating pregnancy, second trimester: Secondary | ICD-10-CM | POA: Insufficient documentation

## 2022-10-02 DIAGNOSIS — O99352 Diseases of the nervous system complicating pregnancy, second trimester: Secondary | ICD-10-CM | POA: Diagnosis not present

## 2022-10-02 DIAGNOSIS — O09522 Supervision of elderly multigravida, second trimester: Secondary | ICD-10-CM | POA: Insufficient documentation

## 2022-10-02 DIAGNOSIS — O365990 Maternal care for other known or suspected poor fetal growth, unspecified trimester, not applicable or unspecified: Secondary | ICD-10-CM | POA: Insufficient documentation

## 2022-10-02 DIAGNOSIS — O99842 Bariatric surgery status complicating pregnancy, second trimester: Secondary | ICD-10-CM

## 2022-10-02 DIAGNOSIS — Z362 Encounter for other antenatal screening follow-up: Secondary | ICD-10-CM | POA: Diagnosis not present

## 2022-10-02 DIAGNOSIS — O34219 Maternal care for unspecified type scar from previous cesarean delivery: Secondary | ICD-10-CM

## 2022-10-02 DIAGNOSIS — R638 Other symptoms and signs concerning food and fluid intake: Secondary | ICD-10-CM

## 2022-10-02 DIAGNOSIS — O09891 Supervision of other high risk pregnancies, first trimester: Secondary | ICD-10-CM

## 2022-10-02 NOTE — Telephone Encounter (Signed)
Called pt. Scheduled sooner f/u for 11/27/22 at 3p with Dr. Felecia Shelling. Pt verbalized understanding and appreciation. She has a scheduled c-section for 12/27/22.

## 2022-10-02 NOTE — Procedures (Signed)
Lynn Roberts Apr 15, 1986 [redacted]w[redacted]d Fetus A Non-Stress Test Interpretation for 10/02/22  Indication: Advanced Maternal Age >40 years and MS, Hs PTD, Obese  Fetal Heart Rate A Mode: External Baseline Rate (A): 130 bpm Variability: Moderate Accelerations: 10 x 10 Decelerations: None Multiple birth?: No  Uterine Activity Mode: Toco Resting Tone Palpated: Relaxed  Interpretation (Fetal Testing) Nonstress Test Interpretation: Reactive Overall Impression: Reassuring for gestational age Comments: none

## 2022-10-09 DIAGNOSIS — K610 Anal abscess: Secondary | ICD-10-CM | POA: Diagnosis not present

## 2022-10-09 DIAGNOSIS — Z3482 Encounter for supervision of other normal pregnancy, second trimester: Secondary | ICD-10-CM | POA: Diagnosis not present

## 2022-10-09 DIAGNOSIS — Z3689 Encounter for other specified antenatal screening: Secondary | ICD-10-CM | POA: Diagnosis not present

## 2022-10-09 DIAGNOSIS — K649 Unspecified hemorrhoids: Secondary | ICD-10-CM | POA: Diagnosis not present

## 2022-10-09 DIAGNOSIS — Z23 Encounter for immunization: Secondary | ICD-10-CM | POA: Diagnosis not present

## 2022-10-09 DIAGNOSIS — Z3A27 27 weeks gestation of pregnancy: Secondary | ICD-10-CM | POA: Diagnosis not present

## 2022-10-10 ENCOUNTER — Inpatient Hospital Stay (HOSPITAL_COMMUNITY)
Admission: AD | Admit: 2022-10-10 | Discharge: 2022-10-13 | DRG: 818 | Disposition: A | Discharge status: Home / Self Care | Payer: 59 | Attending: Obstetrics and Gynecology | Admitting: Obstetrics and Gynecology

## 2022-10-10 ENCOUNTER — Encounter (HOSPITAL_COMMUNITY): Payer: Self-pay | Admitting: Obstetrics and Gynecology

## 2022-10-10 ENCOUNTER — Inpatient Hospital Stay (HOSPITAL_COMMUNITY): Payer: 59

## 2022-10-10 ENCOUNTER — Encounter: Payer: Self-pay | Admitting: Hematology and Oncology

## 2022-10-10 DIAGNOSIS — Z3A27 27 weeks gestation of pregnancy: Secondary | ICD-10-CM

## 2022-10-10 DIAGNOSIS — G35 Multiple sclerosis: Secondary | ICD-10-CM | POA: Diagnosis not present

## 2022-10-10 DIAGNOSIS — O99891 Other specified diseases and conditions complicating pregnancy: Secondary | ICD-10-CM | POA: Diagnosis not present

## 2022-10-10 DIAGNOSIS — K604 Rectal fistula, unspecified: Secondary | ICD-10-CM

## 2022-10-10 DIAGNOSIS — Z3A28 28 weeks gestation of pregnancy: Secondary | ICD-10-CM | POA: Diagnosis not present

## 2022-10-10 DIAGNOSIS — K603 Anal fistula: Secondary | ICD-10-CM | POA: Diagnosis not present

## 2022-10-10 DIAGNOSIS — O09523 Supervision of elderly multigravida, third trimester: Secondary | ICD-10-CM | POA: Diagnosis not present

## 2022-10-10 DIAGNOSIS — O99334 Smoking (tobacco) complicating childbirth: Secondary | ICD-10-CM | POA: Diagnosis not present

## 2022-10-10 DIAGNOSIS — R69 Illness, unspecified: Secondary | ICD-10-CM | POA: Diagnosis not present

## 2022-10-10 DIAGNOSIS — K611 Rectal abscess: Principal | ICD-10-CM | POA: Diagnosis present

## 2022-10-10 DIAGNOSIS — K612 Anorectal abscess: Secondary | ICD-10-CM | POA: Diagnosis present

## 2022-10-10 DIAGNOSIS — O99843 Bariatric surgery status complicating pregnancy, third trimester: Secondary | ICD-10-CM | POA: Diagnosis not present

## 2022-10-10 DIAGNOSIS — F17210 Nicotine dependence, cigarettes, uncomplicated: Secondary | ICD-10-CM | POA: Diagnosis present

## 2022-10-10 DIAGNOSIS — O99213 Obesity complicating pregnancy, third trimester: Secondary | ICD-10-CM | POA: Diagnosis not present

## 2022-10-10 DIAGNOSIS — I471 Supraventricular tachycardia, unspecified: Secondary | ICD-10-CM | POA: Diagnosis not present

## 2022-10-10 DIAGNOSIS — O2243 Hemorrhoids in pregnancy, third trimester: Secondary | ICD-10-CM | POA: Diagnosis not present

## 2022-10-10 DIAGNOSIS — G4730 Sleep apnea, unspecified: Secondary | ICD-10-CM | POA: Diagnosis not present

## 2022-10-10 DIAGNOSIS — O99332 Smoking (tobacco) complicating pregnancy, second trimester: Secondary | ICD-10-CM | POA: Diagnosis not present

## 2022-10-10 LAB — BASIC METABOLIC PANEL
Anion gap: 6 (ref 5–15)
BUN: 8 mg/dL (ref 6–20)
CO2: 23 mmol/L (ref 22–32)
Calcium: 8.2 mg/dL — ABNORMAL LOW (ref 8.9–10.3)
Chloride: 107 mmol/L (ref 98–111)
Creatinine, Ser: 0.57 mg/dL (ref 0.44–1.00)
GFR, Estimated: >60 mL/min (ref >60)
Glucose, Bld: 100 mg/dL — ABNORMAL HIGH (ref 70–99)
Potassium: 3.7 mmol/L (ref 3.5–5.1)
Sodium: 136 mmol/L (ref 135–145)

## 2022-10-10 LAB — CBC
HCT: 34.5 % — ABNORMAL LOW (ref 36.0–46.0)
Hemoglobin: 11.1 g/dL — ABNORMAL LOW (ref 12.0–15.0)
MCH: 28 pg (ref 26.0–34.0)
MCHC: 32.2 g/dL (ref 30.0–36.0)
MCV: 87.1 fL (ref 80.0–100.0)
Platelets: 204 10*3/uL (ref 150–400)
RBC: 3.96 MIL/uL (ref 3.87–5.11)
RDW: 13.8 % (ref 11.5–15.5)
WBC: 13.9 10*3/uL — ABNORMAL HIGH (ref 4.0–10.5)
nRBC: 0 % (ref 0.0–0.2)

## 2022-10-10 MED ORDER — PRENATAL MULTIVITAMIN CH
1.0000 | ORAL_TABLET | Freq: Every day | ORAL | Status: DC
  Administered 2022-10-11 – 2022-10-13 (×3): 1 via ORAL
  Filled 2022-10-10 (×3): qty 1

## 2022-10-10 MED ORDER — PIPERACILLIN-TAZOBACTAM 3.375 G IVPB
3.3750 g | Freq: Three times a day (TID) | INTRAVENOUS | Status: DC
  Administered 2022-10-11 – 2022-10-13 (×8): 3.375 g via INTRAVENOUS
  Filled 2022-10-10 (×8): qty 50

## 2022-10-10 MED ORDER — CALCIUM CARBONATE ANTACID 500 MG PO CHEW
2.0000 | CHEWABLE_TABLET | ORAL | Status: DC | PRN
  Administered 2022-10-12: 400 mg via ORAL
  Filled 2022-10-10: qty 2

## 2022-10-10 MED ORDER — DEXTROSE-NACL 5-0.45 % IV SOLN
INTRAVENOUS | Status: DC
  Administered 2022-10-11: 125 mL/h via INTRAVENOUS

## 2022-10-10 MED ORDER — DOCUSATE SODIUM 100 MG PO CAPS
100.0000 mg | ORAL_CAPSULE | Freq: Every day | ORAL | Status: DC
  Administered 2022-10-12 – 2022-10-13 (×3): 100 mg via ORAL
  Filled 2022-10-10 (×3): qty 1

## 2022-10-10 MED ORDER — FENTANYL CITRATE (PF) 100 MCG/2ML IJ SOLN
50.0000 ug | INTRAMUSCULAR | Status: DC | PRN
  Administered 2022-10-11: 50 ug via INTRAVENOUS
  Filled 2022-10-10: qty 2

## 2022-10-10 MED ORDER — ACETAMINOPHEN 325 MG PO TABS
650.0000 mg | ORAL_TABLET | ORAL | Status: DC | PRN
  Administered 2022-10-10: 650 mg via ORAL
  Filled 2022-10-10: qty 2

## 2022-10-10 NOTE — MAU Provider Note (Addendum)
History     CSN: [id number]  Arrival date and time: 10/10/22 1518   None     Chief Complaint  Patient presents with   rectal abscess   Alexine Pilant is a 36 yo G4P1112 at 47w6dw/pregnancy complicated by AMA, tobacco use, obesity, and Hx of bariatric surgery w/B12 and vitamin D deficiencies presenting for painful buttock abscess. She had an horse-shoe anal abscess 2 years ago that required surgical intervention and placement of seton. She thought it had resolved. However, a month ago, the left started to swell again and burst. She was put on clindamycin in the ED, and since it was already draining on its own, they did not do any more procedures. She finished the clindamycin, and around Monday the other side, the right, started to swell. It's very painful, and she feels like it could burst as well. Does have hemorrhoids, smokes, and has loose stools she attributes to bariatric surgery history. Denies fever, chills, drainage, bleeding.    OB History     Gravida  4   Para  2   Term  1   Preterm  1   AB  1   Living  2      SAB  1   IAB      Ectopic      Multiple  0   Live Births  2           Past Medical History:  Diagnosis Date   Arthritis of low back    Complication of anesthesia    is of IPanamadescent   Family history of adverse reaction to anesthesia    pt is a Lumbi iPanama  GERD (gastroesophageal reflux disease)    none since pregnancy    Heart murmur    When pt. was pregnant with her last child, and also had murmur as a child.     Hemorrhoids    History of anal fissures    History of PAT (paroxysmal atrial tachycardia)    had cardiac ablation as a teenager   History of sleep apnea    none since 450 pound weight loss in 2015   Migraines    MS (multiple sclerosis) (HCC)    PCOD (polycystic ovarian disease)    no menses    Past Surgical History:  Procedure Laterality Date   BREAST LUMPECTOMY Left 2011   papilloma   BREAST LUMPECTOMY Left  2015   papilloma   BREAST LUMPECTOMY WITH RADIOACTIVE SEED LOCALIZATION Left 10/18/2014   Procedure:  RADIOACTIVE SEED GUIDED EXCISIONAL BREAST BIOPSY;  Surgeon: DAlphonsa Overall MD;  Location: [place]  Service: General;  Laterality: Left;   BREAST SURGERY Right 07/27/2004   retro-areolar dissection with exc. of large duct   CARDIAC ELECTROPHYSIOLOGY MAPPING AND ABLATION  sge 13   CARDIAC ELECTROPHYSIOLOGY STUDY AND ABLATION  age 89  CESAREAN SECTION N/A 03/28/2020   Procedure: [place]  Surgeon: SDeliah Boston MD;  [facility];  Service: Obstetrics;  Laterality: N/A;  RNFA [place]   CESAREAN SECTION  03/2018   CHOLECYSTECTOMY  04/27/2008   GASTROPLASTY DUODENAL SWITCH  01/2014   [place]   Procedure: ANORECTAL EXAM UNDER ANESTHESIA INCISION AND DRAINAGE OF PERIRECTAL ABSCESS x2 WITH PLACEMENT OF SETON;  Surgeon: WIleana Roup MD;  Location[facility];  Service: General;  Laterality: N/A;   OVARIAN CYST REMOVAL Bilateral 1999   PLACEMENT  OF SETON  08/15/2020   PLACEMENT OF SETON N/A 12/08/2020   Procedure: PLACEMENT OF CUTTING SETON; PARTIAL FISTULOTOMY;  Surgeon: Ileana Roup, MD;  Location: [place];  Service: General;  Laterality: N/A;   RECTAL EXAM UNDER ANESTHESIA N/A 12/08/2020   Procedure: ANORECTAL EXAM UNDER ANESTHESIA;  Surgeon: Ileana Roup, MD;  Location: [place];  Service: General;  Laterality: N/A;   TOENAIL EXCISION  yrs ago   UNILATERAL SALPINGECTOMY      Family History  Problem Relation Age of Onset   Hypertension Mother    Diabetes Mother    Hypertension Father    Hyperlipidemia Father    Cancer Maternal Grandmother        multiple myaloma, breast cancer   Heart disease Maternal Grandmother    Diabetes Maternal Grandfather    Cancer Paternal Grandfather        pancreactic   Colon cancer Neg Hx    Stomach cancer  Neg Hx    Rectal cancer Neg Hx    Esophageal cancer Neg Hx     Social History   Tobacco Use   Smoking status: Every Day    Packs/day: 0.50    Years: 10.00    Total pack years: 5.00    Types: Cigarettes   Smokeless tobacco: Never   Tobacco comments:    4-5 cigs per day  Vaping Use   Vaping Use: Never used  Substance Use Topics   Alcohol use: No   Drug use: No    Allergies:  Allergies  Allergen Reactions   Nsaids Other (See Comments)    HX. OF WEIGHT-LOSS SURGERY    Other Rash    Reaction to LOTIONS    Sulfa Antibiotics Rash   Tramadol Rash    Had after 2014 and tolerated okay    Medications Prior to Admission  Medication Sig Dispense Refill Last Dose   aspirin EC 81 MG tablet Take 81 mg by mouth daily. Swallow whole.   10/10/2022   cyanocobalamin (VITAMIN B12) 1000 MCG/ML injection INJECT 1 ML INTO THE SKIN EVERY 4 WEEKS 3 mL 1 Past Week   Glatiramer Acetate (COPAXONE [place]) Inject 40 mg into the skin. Three times a week   Past Week   Glatiramer Acetate 40 MG/ML SOSY Inject into the skin.      nicotine (NICODERM CQ - DOSED IN MG/24 HOURS) 14 mg/24hr patch Place 1 patch (14 mg total) onto the skin daily. 28 patch 1 10/09/2022   sertraline (ZOLOFT) 50 MG tablet Take 1 tablet (50 mg total) by mouth daily. 90 tablet 3 10/10/2022   cholecalciferol (VITAMIN D3) 25 MCG (1000 UNIT) tablet Take 1,000 Units by mouth daily.      clindamycin (CLEOCIN) 300 MG capsule Take 1 capsule (300 mg total) by mouth 4 (four) times daily. 28 capsule 0    Copper Gluconate (COPPER CAPS) 2 MG CAPS Take 1 capsule by mouth daily.      ferrous sulfate 325 (65 FE) MG tablet Take 325 mg by mouth daily with breakfast.      Multiple Vitamin (MULTIVITAMIN WITH MINERALS) TABS tablet Take 1 tablet by mouth daily. 30 tablet 0    nicotine (NICODERM CQ) 7 mg/24hr patch Place 1 patch (7 mg total) onto the skin daily. 28 patch 1    nicotine polacrilex (NICORETTE) 2 MG gum Take 1 each (2 mg total) by mouth as needed  for smoking cessation. 50 each 1    Prenat-FeCbn-FeAsp-Meth-FA-DHA (PRENATE  MINI) 18-0.6-0.4-350 MG CAPS Take 1 capsule by mouth daily.       Review of Systems Pertinent positives and negative per HPI, all others reviewed and negative  Physical Exam   Blood pressure 116/65, pulse 91, temperature 98.2 F (36.8 C), temperature source Oral, resp. rate 17, height '5\' 11"'$  (1.803 m), weight 125.7 kg, last menstrual period 03/29/2022, SpO2 97 %.  Physical Exam Vitals and nursing note reviewed. Exam conducted with a chaperone present.  Constitutional:      General: She is not in acute distress.    Appearance: She is obese. She is not ill-appearing, toxic-appearing or diaphoretic.     Comments: Patient has overall grey appearance  HENT:     Head: Normocephalic and atraumatic.  Eyes:     General: No scleral icterus.       Right eye: No discharge.        Left eye: No discharge.     Extraocular Movements: Extraocular movements intact.     Conjunctiva/sclera: Conjunctivae normal.     Pupils: Pupils are equal, round, and reactive to light.  Pulmonary:     Effort: Pulmonary effort is normal.  Genitourinary:    Comments: External hemorrhoids present Indurated but non-erythematous areas bilaterally in the gluteal cleft. Very tender to palpation, more so on the left Musculoskeletal:     Cervical back: Neck supple.  Skin:    General: Skin is warm and dry.  Neurological:     General: No focal deficit present.     Mental Status: She is alert and oriented to person, place, and time.  Psychiatric:        Mood and Affect: Mood normal.        Behavior: Behavior normal.    Results for orders placed or performed during the hospital encounter of 10/10/22 (from the past 24 hour(s))  CBC     Status: Abnormal   Collection Time: 10/10/22  4:18 PM  Result Value Ref Range   WBC 13.9 (H) 4.0 - 10.5 K/uL   RBC 3.96 3.87 - 5.11 MIL/uL   Hemoglobin 11.1 (L) 12.0 - 15.0 g/dL   HCT 34.5 (L) 36.0 - 46.0 %    MCV 87.1 80.0 - 100.0 fL   MCH 28.0 26.0 - 34.0 pg   MCHC 32.2 30.0 - 36.0 g/dL   RDW 13.8 11.5 - 15.5 %   Platelets 204 150 - 400 K/uL   nRBC 0.0 0.0 - 0.2 %  Basic metabolic panel     Status: Abnormal   Collection Time: 10/10/22  4:18 PM  Result Value Ref Range   Sodium 136 135 - 145 mmol/L   Potassium 3.7 3.5 - 5.1 mmol/L   Chloride 107 98 - 111 mmol/L   CO2 23 22 - 32 mmol/L   Glucose, Bld 100 (H) 70 - 99 mg/dL   BUN 8 6 - 20 mg/dL   Creatinine, Ser 0.57 0.44 - 1.00 mg/dL   Calcium 8.2 (L) 8.9 - 10.3 mg/dL   GFR, Estimated >60 >60 mL/min   Anion gap 6 5 - 15    PELVIC MRI Verbal report from Radiologist (full report to follow) Left perirectal abscess with greatest diameter 5.5cm Right Perirectal abscess with greatest diameter 4.1cm New fistula coming from Left  MAU Course  Procedures  MDM  Suspect perianal abscess given symptoms, exam, and history.Clindamycin could be controlling the infection, which might be why the area does not appear very inflamed at the moment.   Consulted general surgery.  Will obtain BMP, CBC, and MRI of the pelvis to visualize the area. Treatment would depend on imaging.  Do suspect that patient is immunosuppressed given Hx of smoking and vitamin D deficiency, as well as pregnancy. This is likely contributing to abscess formation. Assessment and Plan  Perianal abscess -imaging pending, discuss results with General Surgery once available   Wilhemina Cash 10/10/2022, 7:41 PM      ATTENDING ATTESTATION  I have seen and examined this patient and agree with the above documentation in the resident's note except as below.  I have edited the above note. Signout given to Hansel Feinstein at change of shift.  Clarnce Flock, MD/[facility] (Faculty Practice) 10/10/2022, 8:02 PM    Received verbal report from Radiologist (see above edited note) confirming bilateral perirectal abscesses with new fistula  Consulted  General Surgery Greer Pickerel MD) who recommends surgery tomorrow with admission tonight and coverage with Zosyn.  NPO p MN  Dr Philis Pique updated and will admit patient  Seabron Spates, CNM

## 2022-10-10 NOTE — H&P (Deleted)
History     CSN: [id number]  Arrival date and time: 10/10/22 1518   None     Chief Complaint  Patient presents with   rectal abscess   Lynn Roberts is a 36 yo G4P1112 at 69w6dw/pregnancy complicated by AMA, tobacco use, obesity, and Hx of bariatric surgery w/B12 and vitamin D deficiencies presenting for painful buttock abscess. She had an horse-shoe anal abscess 2 years ago that required surgical intervention and placement of seton. She thought it had resolved. However, a month ago, the left started to swell again and burst. She was put on clindamycin in the ED, and since it was already draining on its own, they did not do any more procedures. She finished the clindamycin, and around Monday the other side, the right, started to swell. It's very painful, and she feels like it could burst as well. Does have hemorrhoids, smokes, and has loose stools she attributes to bariatric surgery history. Denies fever, chills, drainage, bleeding.    OB History     Gravida  4   Para  2   Term  1   Preterm  1   AB  1   Living  2      SAB  1   IAB      Ectopic      Multiple  0   Live Births  2           Past Medical History:  Diagnosis Date   Arthritis of low back    Complication of anesthesia    is of IPanamadescent   Family history of adverse reaction to anesthesia    pt is a Lumbi iPanama  GERD (gastroesophageal reflux disease)    none since pregnancy    Heart murmur    When pt. was pregnant with her last child, and also had murmur as a child.     Hemorrhoids    History of anal fissures    History of PAT (paroxysmal atrial tachycardia)    had cardiac ablation as a teenager   History of sleep apnea    none since 450 pound weight loss in 2015   Migraines    MS (multiple sclerosis) (HCC)    PCOD (polycystic ovarian disease)    no menses    Past Surgical History:  Procedure Laterality Date   BREAST LUMPECTOMY Left 2011   papilloma   BREAST LUMPECTOMY Left  2015   papilloma   BREAST LUMPECTOMY WITH RADIOACTIVE SEED LOCALIZATION Left 10/18/2014   Procedure:  RADIOACTIVE SEED GUIDED EXCISIONAL BREAST BIOPSY;  Surgeon: DAlphonsa Overall MD;  [place]  Service: General;  Laterality: Left;   BREAST SURGERY Right 07/27/2004   retro-areolar dissection with exc. of large duct   CARDIAC ELECTROPHYSIOLOGY MAPPING AND ABLATION  sge 13   CARDIAC ELECTROPHYSIOLOGY STUDY AND ABLATION  age 36  CESAREAN SECTION N/A 03/28/2020   Procedure: [place]  Surgeon: SDeliah Boston MD;  [facility];  Service: Obstetrics;  Laterality: N/A;  RNFA [place]   CESAREAN SECTION  03/2018   CHOLECYSTECTOMY  04/27/2008   GASTROPLASTY DUODENAL SWITCH  01/2014   [place]   Procedure: ANORECTAL EXAM UNDER ANESTHESIA INCISION AND DRAINAGE OF PERIRECTAL ABSCESS x2 WITH PLACEMENT OF SETON;  Surgeon: WIleana Roup MD;  [facility];  Service: General;  Laterality: N/A;   OVARIAN CYST REMOVAL Bilateral 1999   PLACEMENT  OF SETON  08/15/2020   PLACEMENT OF SETON N/A 12/08/2020   Procedure: PLACEMENT OF CUTTING SETON; PARTIAL FISTULOTOMY;  Surgeon: Ileana Roup, MD;  Location: [place];  Service: General;  Laterality: N/A;   RECTAL EXAM UNDER ANESTHESIA N/A 12/08/2020   Procedure: ANORECTAL EXAM UNDER ANESTHESIA;  Surgeon: Ileana Roup, MD;  Location: [place];  Service: General;  Laterality: N/A;   TOENAIL EXCISION  yrs ago   UNILATERAL SALPINGECTOMY      Family History  Problem Relation Age of Onset   Hypertension Mother    Diabetes Mother    Hypertension Father    Hyperlipidemia Father    Cancer Maternal Grandmother        multiple myaloma, breast cancer   Heart disease Maternal Grandmother    Diabetes Maternal Grandfather    Cancer Paternal Grandfather        pancreactic   Colon cancer Neg Hx    Stomach cancer  Neg Hx    Rectal cancer Neg Hx    Esophageal cancer Neg Hx     Social History   Tobacco Use   Smoking status: Every Day    Packs/day: 0.50    Years: 10.00    Total pack years: 5.00    Types: Cigarettes   Smokeless tobacco: Never   Tobacco comments:    4-5 cigs per day  Vaping Use   Vaping Use: Never used  Substance Use Topics   Alcohol use: No   Drug use: No    Allergies:  Allergies  Allergen Reactions   Nsaids Other (See Comments)    HX. OF WEIGHT-LOSS SURGERY    Other Rash    Reaction to LOTIONS    Sulfa Antibiotics Rash   Tramadol Rash    Had after 2014 and tolerated okay    Medications Prior to Admission  Medication Sig Dispense Refill Last Dose   aspirin EC 81 MG tablet Take 81 mg by mouth daily. Swallow whole.   10/10/2022   cyanocobalamin (VITAMIN B12) 1000 MCG/ML injection INJECT 1 ML INTO THE SKIN EVERY 4 WEEKS 3 mL 1 Past Week   Glatiramer Acetate (COPAXONE [place]) Inject 40 mg into the skin. Three times a week   Past Week   Glatiramer Acetate 40 MG/ML SOSY Inject into the skin.      nicotine (NICODERM CQ - DOSED IN MG/24 HOURS) 14 mg/24hr patch Place 1 patch (14 mg total) onto the skin daily. 28 patch 1 10/09/2022   sertraline (ZOLOFT) 50 MG tablet Take 1 tablet (50 mg total) by mouth daily. 90 tablet 3 10/10/2022   cholecalciferol (VITAMIN D3) 25 MCG (1000 UNIT) tablet Take 1,000 Units by mouth daily.      clindamycin (CLEOCIN) 300 MG capsule Take 1 capsule (300 mg total) by mouth 4 (four) times daily. 28 capsule 0    Copper Gluconate (COPPER CAPS) 2 MG CAPS Take 1 capsule by mouth daily.      ferrous sulfate 325 (65 FE) MG tablet Take 325 mg by mouth daily with breakfast.      Multiple Vitamin (MULTIVITAMIN WITH MINERALS) TABS tablet Take 1 tablet by mouth daily. 30 tablet 0    nicotine (NICODERM CQ) 7 mg/24hr patch Place 1 patch (7 mg total) onto the skin daily. 28 patch 1    nicotine polacrilex (NICORETTE) 2 MG gum Take 1 each (2 mg total) by mouth as needed  for smoking cessation. 50 each 1    Prenat-FeCbn-FeAsp-Meth-FA-DHA (PRENATE  MINI) 18-0.6-0.4-350 MG CAPS Take 1 capsule by mouth daily.       Review of Systems Pertinent positives and negative per HPI, all others reviewed and negative  Physical Exam   Blood pressure 116/65, pulse 91, temperature 98.2 F (36.8 C), temperature source Oral, resp. rate 17, height '5\' 11"'$  (1.803 m), weight 125.7 kg, last menstrual period 03/29/2022, SpO2 97 %.  Physical Exam Vitals and nursing note reviewed. Exam conducted with a chaperone present.  Constitutional:      General: She is not in acute distress.    Appearance: She is obese. She is not ill-appearing, toxic-appearing or diaphoretic.     Comments: Patient has overall grey appearance  HENT:     Head: Normocephalic and atraumatic.  Eyes:     General: No scleral icterus.       Right eye: No discharge.        Left eye: No discharge.     Extraocular Movements: Extraocular movements intact.     Conjunctiva/sclera: Conjunctivae normal.     Pupils: Pupils are equal, round, and reactive to light.  Pulmonary:     Effort: Pulmonary effort is normal.  Genitourinary:    Comments: External hemorrhoids present Indurated but non-erythematous areas bilaterally in the gluteal cleft. Very tender to palpation, more so on the left Musculoskeletal:     Cervical back: Neck supple.  Skin:    General: Skin is warm and dry.  Neurological:     General: No focal deficit present.     Mental Status: She is alert and oriented to person, place, and time.  Psychiatric:        Mood and Affect: Mood normal.        Behavior: Behavior normal.    Results for orders placed or performed during the hospital encounter of 10/10/22 (from the past 24 hour(s))  CBC     Status: Abnormal   Collection Time: 10/10/22  4:18 PM  Result Value Ref Range   WBC 13.9 (H) 4.0 - 10.5 K/uL   RBC 3.96 3.87 - 5.11 MIL/uL   Hemoglobin 11.1 (L) 12.0 - 15.0 g/dL   HCT 34.5 (L) 36.0 - 46.0 %    MCV 87.1 80.0 - 100.0 fL   MCH 28.0 26.0 - 34.0 pg   MCHC 32.2 30.0 - 36.0 g/dL   RDW 13.8 11.5 - 15.5 %   Platelets 204 150 - 400 K/uL   nRBC 0.0 0.0 - 0.2 %  Basic metabolic panel     Status: Abnormal   Collection Time: 10/10/22  4:18 PM  Result Value Ref Range   Sodium 136 135 - 145 mmol/L   Potassium 3.7 3.5 - 5.1 mmol/L   Chloride 107 98 - 111 mmol/L   CO2 23 22 - 32 mmol/L   Glucose, Bld 100 (H) 70 - 99 mg/dL   BUN 8 6 - 20 mg/dL   Creatinine, Ser 0.57 0.44 - 1.00 mg/dL   Calcium 8.2 (L) 8.9 - 10.3 mg/dL   GFR, Estimated >60 >60 mL/min   Anion gap 6 5 - 15    PELVIC MRI Verbal report from Radiologist (full report to follow) Left perirectal abscess with greatest diameter 5.5cm Right Perirectal abscess with greatest diameter 4.1cm New fistula coming from Left  MAU Course  Procedures  MDM  Suspect perianal abscess given symptoms, exam, and history.Clindamycin could be controlling the infection, which might be why the area does not appear very inflamed at the moment.   Consulted general surgery.  Will obtain BMP, CBC, and MRI of the pelvis to visualize the area. Treatment would depend on imaging.  Do suspect that patient is immunosuppressed given Hx of smoking and vitamin D deficiency, as well as pregnancy. This is likely contributing to abscess formation. Assessment and Plan  Perianal abscess -imaging pending, discuss results with General Surgery once available   Wilhemina Cash 10/10/2022, 7:41 PM      ATTENDING ATTESTATION  I have seen and examined this patient and agree with the above documentation in the resident's note except as below.  I have edited the above note. Signout given to Hansel Feinstein at change of shift.  Clarnce Flock, MD/[facility] (Faculty Practice) 10/10/2022, 8:02 PM    Received verbal report from Radiologist (see above edited note) confirming bilateral perirectal abscesses with new fistula  Consulted  General Surgery Greer Pickerel MD) who recommends surgery tomorrow with admission tonight and coverage with Zosyn.  NPO p MN  Dr Philis Pique updated and will admit patient  Seabron Spates, CNM

## 2022-10-10 NOTE — MAU Note (Signed)
Lynn Roberts is a 36 y.o. at 55w6dhere in MAU reporting: 2 yrs ago, had an anal abscess, thought it was from hemorrhoids. Had to have surgery, has not had problems since.   About a month ago, the left side started swelling and busted open.  Went to the ER, was put on antibiotics, they didn't want to do anything else, because it was already draining. Finished rx.  About 3 days ago, the rt side started swelling.  Had routine OB appt, the OB checked it out and referred her to the surgeon.  She called them this morning, couldn't get seen today, didn't want her to wait since she was preg, so told to come in.   Onset of complaint: 3days Pain score: 9 Vitals:   10/10/22 1542  BP: 116/65  Pulse: 91  Resp: 17  Temp: 98.2 F (36.8 C)  SpO2: 97%     FHT:156 Lab orders placed from triage:  none

## 2022-10-11 ENCOUNTER — Encounter (HOSPITAL_COMMUNITY)
Admission: AD | Disposition: A | Discharge status: Home / Self Care | Payer: Self-pay | Source: Home / Self Care | Attending: Obstetrics and Gynecology

## 2022-10-11 ENCOUNTER — Other Ambulatory Visit: Payer: Self-pay

## 2022-10-11 ENCOUNTER — Encounter (HOSPITAL_COMMUNITY): Payer: Self-pay | Admitting: Obstetrics and Gynecology

## 2022-10-11 ENCOUNTER — Inpatient Hospital Stay (HOSPITAL_COMMUNITY): Payer: 59 | Admitting: Anesthesiology

## 2022-10-11 DIAGNOSIS — F17210 Nicotine dependence, cigarettes, uncomplicated: Secondary | ICD-10-CM

## 2022-10-11 DIAGNOSIS — K611 Rectal abscess: Secondary | ICD-10-CM | POA: Diagnosis not present

## 2022-10-11 DIAGNOSIS — O99332 Smoking (tobacco) complicating pregnancy, second trimester: Secondary | ICD-10-CM | POA: Diagnosis not present

## 2022-10-11 DIAGNOSIS — O99891 Other specified diseases and conditions complicating pregnancy: Secondary | ICD-10-CM | POA: Diagnosis not present

## 2022-10-11 DIAGNOSIS — I471 Supraventricular tachycardia, unspecified: Secondary | ICD-10-CM

## 2022-10-11 DIAGNOSIS — Z3A28 28 weeks gestation of pregnancy: Secondary | ICD-10-CM | POA: Diagnosis not present

## 2022-10-11 DIAGNOSIS — G4730 Sleep apnea, unspecified: Secondary | ICD-10-CM

## 2022-10-11 DIAGNOSIS — R69 Illness, unspecified: Secondary | ICD-10-CM | POA: Diagnosis not present

## 2022-10-11 HISTORY — PX: INCISION AND DRAINAGE ABSCESS: SHX5864

## 2022-10-11 LAB — FOLATE: Folate: 14.8 ng/mL (ref >5.9)

## 2022-10-11 LAB — IRON AND TIBC
Iron: 49 ug/dL (ref 28–170)
Saturation Ratios: 14 % (ref 10.4–31.8)
TIBC: 349 ug/dL (ref 250–450)
UIBC: 300 ug/dL

## 2022-10-11 LAB — TYPE AND SCREEN
ABO/RH(D): B POS
Antibody Screen: NEGATIVE

## 2022-10-11 LAB — VITAMIN B12: Vitamin B-12: 617 pg/mL (ref 180–914)

## 2022-10-11 LAB — FERRITIN: Ferritin: 22 ng/mL (ref 11–307)

## 2022-10-11 SURGERY — INCISION AND DRAINAGE, ABSCESS
Anesthesia: General | Laterality: Bilateral

## 2022-10-11 MED ORDER — ONDANSETRON HCL 4 MG/2ML IJ SOLN
INTRAMUSCULAR | Status: AC
  Filled 2022-10-11: qty 2

## 2022-10-11 MED ORDER — ACETAMINOPHEN 500 MG PO TABS
1000.0000 mg | ORAL_TABLET | Freq: Four times a day (QID) | ORAL | Status: DC
  Administered 2022-10-11 – 2022-10-13 (×8): 1000 mg via ORAL
  Filled 2022-10-11 (×8): qty 2

## 2022-10-11 MED ORDER — ORAL CARE MOUTH RINSE: 15.0000 mL | Freq: Once | OROMUCOSAL | Status: AC

## 2022-10-11 MED ORDER — SUGAMMADEX SODIUM 200 MG/2ML IV SOLN
INTRAVENOUS | Status: DC | PRN
  Administered 2022-10-11: 200 mg via INTRAVENOUS

## 2022-10-11 MED ORDER — LIDOCAINE HCL 1 % IJ SOLN
INTRAMUSCULAR | Status: DC | PRN
  Administered 2022-10-11: 20 mL

## 2022-10-11 MED ORDER — ROCURONIUM BROMIDE 10 MG/ML (PF) SYRINGE
PREFILLED_SYRINGE | INTRAVENOUS | Status: DC | PRN
  Administered 2022-10-11: 60 mg via INTRAVENOUS

## 2022-10-11 MED ORDER — ONDANSETRON HCL 4 MG/2ML IJ SOLN
INTRAMUSCULAR | Status: DC | PRN
  Administered 2022-10-11: 4 mg via INTRAVENOUS

## 2022-10-11 MED ORDER — FENTANYL CITRATE (PF) 250 MCG/5ML IJ SOLN
INTRAMUSCULAR | Status: DC | PRN
  Administered 2022-10-11 (×2): 50 ug via INTRAVENOUS
  Administered 2022-10-11: 100 ug via INTRAVENOUS

## 2022-10-11 MED ORDER — PROPOFOL 10 MG/ML IV BOLUS
INTRAVENOUS | Status: DC | PRN
  Administered 2022-10-11: 120 mg via INTRAVENOUS

## 2022-10-11 MED ORDER — FENTANYL CITRATE (PF) 250 MCG/5ML IJ SOLN
INTRAMUSCULAR | Status: AC
  Filled 2022-10-11: qty 5

## 2022-10-11 MED ORDER — OXYCODONE HCL 5 MG PO TABS: 5.0000 mg | ORAL_TABLET | Freq: Once | ORAL | Status: DC | PRN

## 2022-10-11 MED ORDER — SUCCINYLCHOLINE CHLORIDE 200 MG/10ML IV SOSY
PREFILLED_SYRINGE | INTRAVENOUS | Status: AC
  Filled 2022-10-11: qty 10

## 2022-10-11 MED ORDER — OXYCODONE HCL 5 MG/5ML PO SOLN: 5.0000 mg | Freq: Once | ORAL | Status: DC | PRN

## 2022-10-11 MED ORDER — BUPIVACAINE HCL (PF) 0.25 % IJ SOLN
INTRAMUSCULAR | Status: AC
  Filled 2022-10-11: qty 30

## 2022-10-11 MED ORDER — DEXAMETHASONE SODIUM PHOSPHATE 10 MG/ML IJ SOLN
INTRAMUSCULAR | Status: AC
  Filled 2022-10-11: qty 1

## 2022-10-11 MED ORDER — LACTATED RINGERS IV SOLN: INTRAVENOUS | Status: DC

## 2022-10-11 MED ORDER — LACTATED RINGERS IV SOLN: INTRAVENOUS | Status: DC | PRN

## 2022-10-11 MED ORDER — MIDAZOLAM HCL 2 MG/2ML IJ SOLN: 0.5000 mg | Freq: Once | INTRAMUSCULAR | Status: DC | PRN

## 2022-10-11 MED ORDER — OXYCODONE HCL 5 MG PO TABS
5.0000 mg | ORAL_TABLET | ORAL | Status: DC | PRN
  Administered 2022-10-11: 10 mg via ORAL
  Administered 2022-10-11: 5 mg via ORAL
  Administered 2022-10-12 – 2022-10-13 (×6): 10 mg via ORAL
  Filled 2022-10-11 (×4): qty 2
  Filled 2022-10-11: qty 1
  Filled 2022-10-11 (×3): qty 2

## 2022-10-11 MED ORDER — SCOPOLAMINE 1 MG/3DAYS TD PT72: 1.0000 | MEDICATED_PATCH | TRANSDERMAL | Status: DC

## 2022-10-11 MED ORDER — FENTANYL CITRATE (PF) 100 MCG/2ML IJ SOLN
25.0000 ug | INTRAMUSCULAR | Status: DC | PRN
  Administered 2022-10-11 (×2): 25 ug via INTRAVENOUS

## 2022-10-11 MED ORDER — SCOPOLAMINE 1 MG/3DAYS TD PT72
MEDICATED_PATCH | TRANSDERMAL | Status: AC
  Administered 2022-10-11: 1.5 mg via TRANSDERMAL
  Filled 2022-10-11: qty 1

## 2022-10-11 MED ORDER — GLATIRAMER ACETATE 20 MG/ML ~~LOC~~ SOSY: 40.0000 mg | PREFILLED_SYRINGE | SUBCUTANEOUS | Status: DC

## 2022-10-11 MED ORDER — CHLORHEXIDINE GLUCONATE 0.12 % MT SOLN
15.0000 mL | Freq: Once | OROMUCOSAL | Status: AC
  Administered 2022-10-11: 15 mL via OROMUCOSAL
  Filled 2022-10-11 (×2): qty 15

## 2022-10-11 MED ORDER — LIDOCAINE 2% (20 MG/ML) 5 ML SYRINGE
INTRAMUSCULAR | Status: DC | PRN
  Administered 2022-10-11: 60 mg via INTRAVENOUS

## 2022-10-11 MED ORDER — LIDOCAINE HCL (PF) 1 % IJ SOLN
INTRAMUSCULAR | Status: AC
  Filled 2022-10-11: qty 30

## 2022-10-11 MED ORDER — DEXAMETHASONE SODIUM PHOSPHATE 10 MG/ML IJ SOLN
INTRAMUSCULAR | Status: DC | PRN
  Administered 2022-10-11: 10 mg via INTRAVENOUS

## 2022-10-11 MED ORDER — PROPOFOL 10 MG/ML IV BOLUS
INTRAVENOUS | Status: AC
  Filled 2022-10-11: qty 20

## 2022-10-11 MED ORDER — FENTANYL CITRATE (PF) 100 MCG/2ML IJ SOLN
INTRAMUSCULAR | Status: AC
  Filled 2022-10-11: qty 2

## 2022-10-11 MED ORDER — ORAL CARE MOUTH RINSE: 15.0000 mL | OROMUCOSAL | Status: DC | PRN

## 2022-10-11 SURGICAL SUPPLY — 41 items
BAG COUNTER SPONGE SURGICOUNT (BAG) ×1 IMPLANT
BAG SPNG CNTER NS LX DISP (BAG) ×1
BNDG GAUZE DERMACEA FLUFF 4 (GAUZE/BANDAGES/DRESSINGS) IMPLANT
BNDG GZE DERMACEA 4 6PLY (GAUZE/BANDAGES/DRESSINGS)
CANISTER SUCT 3000ML PPV (MISCELLANEOUS) ×1 IMPLANT
COVER SURGICAL LIGHT HANDLE (MISCELLANEOUS) ×1 IMPLANT
DRAIN PENROSE 0.5X18 (DRAIN) IMPLANT
DRAPE LAPAROSCOPIC ABDOMINAL (DRAPES) IMPLANT
DRAPE LAPAROTOMY 100X72 PEDS (DRAPES) ×1 IMPLANT
ELECT CAUTERY BLADE 6.4 (BLADE) ×1 IMPLANT
ELECT REM PT RETURN 9FT ADLT (ELECTROSURGICAL) ×1
ELECTRODE REM PT RTRN 9FT ADLT (ELECTROSURGICAL) ×1 IMPLANT
GAUZE PAD ABD 7.5X8 STRL (GAUZE/BANDAGES/DRESSINGS) IMPLANT
GAUZE PAD ABD 8X10 STRL (GAUZE/BANDAGES/DRESSINGS) ×1 IMPLANT
GAUZE SPONGE 4X4 12PLY STRL (GAUZE/BANDAGES/DRESSINGS) ×1 IMPLANT
GLOVE BIO SURGEON STRL SZ7 (GLOVE) ×1 IMPLANT
GLOVE BIOGEL PI IND STRL 7.5 (GLOVE) ×1 IMPLANT
GOWN STRL REUS W/ TWL LRG LVL3 (GOWN DISPOSABLE) ×2 IMPLANT
GOWN STRL REUS W/TWL LRG LVL3 (GOWN DISPOSABLE) ×2
KIT BASIN OR (CUSTOM PROCEDURE TRAY) ×1 IMPLANT
KIT SIGMOIDOSCOPE (SET/KITS/TRAYS/PACK) IMPLANT
KIT TURNOVER KIT B (KITS) ×1 IMPLANT
NDL HYPO 25GX1X1/2 BEV (NEEDLE) ×1 IMPLANT
NEEDLE HYPO 25GX1X1/2 BEV (NEEDLE) ×1 IMPLANT
NS IRRIG 1000ML POUR BTL (IV SOLUTION) ×1 IMPLANT
PACK GENERAL/GYN (CUSTOM PROCEDURE TRAY) ×1 IMPLANT
PACKING GAUZE IODOFORM 1INX5YD (GAUZE/BANDAGES/DRESSINGS) IMPLANT
PAD ARMBOARD 7.5X6 YLW CONV (MISCELLANEOUS) ×2 IMPLANT
PENCIL SMOKE EVACUATOR (MISCELLANEOUS) ×1 IMPLANT
SPIKE FLUID TRANSFER (MISCELLANEOUS) ×1 IMPLANT
SPONGE SURGIFOAM ABS GEL 100 (HEMOSTASIS) IMPLANT
SURGILUBE 2OZ TUBE FLIPTOP (MISCELLANEOUS) ×1 IMPLANT
SUT CHROMIC 2 0 SH (SUTURE) IMPLANT
SUT ETHILON 3 0 PS 1 (SUTURE) IMPLANT
SUT MON AB 3-0 SH 27 (SUTURE)
SUT MON AB 3-0 SH27 (SUTURE) IMPLANT
SWAB COLLECTION DEVICE MRSA (MISCELLANEOUS) IMPLANT
SWAB CULTURE ESWAB REG 1ML (MISCELLANEOUS) IMPLANT
SYR CONTROL 10ML LL (SYRINGE) ×1 IMPLANT
TOWEL GREEN STERILE (TOWEL DISPOSABLE) ×1 IMPLANT
TOWEL GREEN STERILE FF (TOWEL DISPOSABLE) ×1 IMPLANT

## 2022-10-11 NOTE — Anesthesia Preprocedure Evaluation (Addendum)
Anesthesia Evaluation  Patient identified by MRN, date of birth, ID band Patient awake    Reviewed: Allergy & Precautions, NPO status , Patient's Chart, lab work & pertinent test results  History of Anesthesia Complications Negative for: history of anesthetic complications  Airway Mallampati: I  TM Distance: >3 FB Neck ROM: Full    Dental  (+) Dental Advisory Given, Chipped   Pulmonary sleep apnea (does not use CPAP) , Current Smoker   breath sounds clear to auscultation       Cardiovascular + dysrhythmias (s/p ablation) Supra Ventricular Tachycardia  Rhythm:Regular Rate:Normal  '20 ECHO: EF 60-65%. The LV has normal function, no LVH. RV has normal systolic function. No significant valvular abnormalities    Neuro/Psych  Headaches MS    GI/Hepatic Neg liver ROS,,,S/p gastric surgery: 450# weight loss   Endo/Other  BMI 38.7  Renal/GU negative Renal ROS     Musculoskeletal   Abdominal  (+) + obese  Peds  Hematology  (+) anemia   Anesthesia Other Findings   Reproductive/Obstetrics (+) Pregnancy (28 weeks)                             Anesthesia Physical Anesthesia Plan  ASA: 3  Anesthesia Plan: General   Post-op Pain Management: Minimal or no pain anticipated   Induction: Intravenous  PONV Risk Score and Plan: 2 and Ondansetron and Scopolamine patch - Pre-op  Airway Management Planned: Oral ETT  Additional Equipment: None  Intra-op Plan:   Post-operative Plan: Extubation in OR  Informed Consent: I have reviewed the patients History and Physical, chart, labs and discussed the procedure including the risks, benefits and alternatives for the proposed anesthesia with the patient or authorized representative who has indicated his/her understanding and acceptance.     Dental advisory given  Plan Discussed with: CRNA and Surgeon  Anesthesia Plan Comments: (Dr Wilhelmenia Blase recommends  monitoring fetal heart rate pre-procedure/post-procedure:  pre-op FHR 135)        Anesthesia Quick Evaluation

## 2022-10-11 NOTE — Progress Notes (Addendum)
At [place] patient woke up and moderate amount dark, bloody, foul drainage noted coming from rectal area.  Peri care done, linens, gown, bed pad changed at this time.  Patient stated she does not feel as much pressure on the left side of rectum

## 2022-10-11 NOTE — Op Note (Signed)
Preoperative diagnosis: 28 weeks pregnancy, bilateral perirectal abscess, history of horseshoe abscess Postoperative diagnosis: Same as above Procedure: 1.  Incision and drainage of left perirectal abscess 2.  Incision and drainage of right perirectal abscess with placement of Penrose drain Surgeon: Dr. Serita Grammes Anesthesia: General Complications: None Drains: Penrose to right sided abscess Specimens: Cultures to microbiology from right side Sponge and count was correct at completion Disposition to recovery stable condition  Indications: 36 y.o. female who is here for worsening perirectal pain.  The patient is currently [redacted] weeks pregnant.   She also has a history of bilateral ischial rectal abscesses.  She underwent incision and drainage of bilateral ischiorectal abscesses with placement of seton for fistula in September 2021 by Dr. Dema Severin.  She subsequently underwent fistulotomy and placement of cutting seton in January 2022.  She states about a month ago she noticed some swelling on her left perirectal side.  She states that it ruptured spontaneously and went to the ER and since it was draining no additional maneuvers were done.  She states about 3 days ago she started having right-sided perirectal pain it got worse and she could not tolerate it.She had an MRI that showed bilateral perirectal abscesses by report.  The right side had an abscess to 5.5 cm & the left 4.1 cm along with a fistula on the left.  We discussed going to the operating room for simple abscess drainage on both sides today.  Procedure: After informed consent was obtained she was taken to the operating room.  She was given antibiotics.  She was checked by OB/GYN prior to beginning she had SCDs in place.  She was placed under general anesthesia without complication.  She was then placed in lithotomy and appropriately padded.  She was prepped and draped in the standard sterile surgical fashion.  A surgical timeout was then  performed.  I was able to identify the area on the left that it began spontaneously draining.  The right side was easily identifiable as well.  She had scars located at both of the sites previously.  I then noted a small hole where she was drained and on the left.  I made a counterincision to this including an ellipse of skin to completely open it lateral to that.  I was then able to enter a very chronic cavity with a significant amount of scar tissue.  There was some murky fluid that was present and I connected this to the spontaneously draining site.  There was no other purulence after this.  I obtained hemostasis and I packed this side with 1 inch iodoform gauze.  The right side had not been spontaneously draining.  I made an elliptical incision over this and remove the small piece of skin.  I then entered into an abscess cavity although again this was difficult due to the significant amount of scar tissue was present.  There was a fair amount of purulence and this tracked superiorly or along her rectum but I was not able to connect this to the other side anywhere.  I did cultures on this side.  I then placed some iodoform as there was some oozing from the scar.  I did place a Penrose drain at the extent of this and sutured this in place with a 2-0 nylon suture.  She tolerated this well.  Dressings were placed.  She was extubated transferred to recovery stable.

## 2022-10-11 NOTE — Interval H&P Note (Signed)
History and Physical Interval Note:  10/11/2022 10:41 AM Have seen patient. Reviewed imaging. Has bilateral perirectals with possible fistula. I am planning today to just drain abscesses and we discussed that as well as need for colorectal follow up likely after pregnancy.  Lynn Roberts  has presented today for surgery, with the diagnosis of Bilateral Perirectal abscess.  The various methods of treatment have been discussed with the patient and family. After consideration of risks, benefits and other options for treatment, the patient has consented to  Procedure(s): Incision and drainage of Bilateral Perirectoal abscess, Possible Seton (Bilateral) EXAM UNDER ANESTHESIA (Bilateral) as a surgical intervention.  The patient's history has been reviewed, patient examined, no change in status, stable for surgery.  I have reviewed the patient's chart and labs.  Questions were answered to the patient's satisfaction.     Rolm Bookbinder

## 2022-10-11 NOTE — H&P (View-Only) (Signed)
CC: perirectal pain  Requesting provider: Lelan Pons williams   HPI: Lynn Roberts is an 36 y.o. female who is here for worsening perirectal pain.  The patient is currently [redacted] weeks pregnant.  She has a remote history of laparoscopic duodenal switch in 2015 at [facility].  She also has a history of bilateral ischial rectal abscesses.  She underwent incision and drainage of bilateral ischiorectal abscesses with placement of seton for fistula in September 2021 by Dr. Dema Severin.  She subsequently underwent fistulotomy and placement of cutting seton in January 2022.  She states that she has been doing well.  She states about a month ago she noticed some swelling on her left perirectal side.  She states that it ruptured spontaneously and went to the ER and since it was draining no additional maneuvers were done.  She states about 3 days ago she started having right-sided perirectal pain it got worse and she could not tolerate it.  No fever or chills.  No dysuria.   Has not had vitamin lab surveillance.  She states that she is currently taking vitamin D, copper and a Flintstones chewable twice a day  She had an MRI that showed bilateral perirectal abscesses by report.  The right side had an abscess to 5.5 cm & the left 4.1 cm along with a fistula on the left.  We were asked to evaluate.  Past Medical History:  Diagnosis Date   Arthritis of low back    Complication of anesthesia    is of [name] descent   Family history of adverse reaction to anesthesia    pt is a Lumbi [name]   GERD (gastroesophageal reflux disease)    none since pregnancy    Heart murmur    When pt. was pregnant with her last child, and also had murmur as a child.     Hemorrhoids    History of anal fissures    History of PAT (paroxysmal atrial tachycardia)    had cardiac ablation as a teenager   History of sleep apnea    none since 450 pound weight loss in 2015   Migraines    MS (multiple sclerosis) (HCC)    PCOD (polycystic ovarian  disease)    no menses    Past Surgical History:  Procedure Laterality Date   BREAST LUMPECTOMY Left 2011   papilloma   BREAST LUMPECTOMY Left 2015   papilloma   BREAST LUMPECTOMY WITH RADIOACTIVE SEED LOCALIZATION Left 10/18/2014   Procedure:  RADIOACTIVE SEED GUIDED EXCISIONAL BREAST BIOPSY;  Surgeon: Alphonsa Overall, MD;  Location: [facility];  Service: General;  Laterality: Left;   BREAST SURGERY Right 07/27/2004   retro-areolar dissection with exc. of large duct   CARDIAC ELECTROPHYSIOLOGY MAPPING AND ABLATION  sge 13   CARDIAC ELECTROPHYSIOLOGY STUDY AND ABLATION  age 61   CESAREAN SECTION N/A 03/28/2020   Procedure: [place];  Surgeon: Deliah Boston, MD;  Location: [facility];  Service: Obstetrics;  Laterality: N/A;  RNFA [place]   CESAREAN SECTION  03/2018   CHOLECYSTECTOMY  04/27/2008   GASTROPLASTY DUODENAL SWITCH  01/2014   done at [place] N/A 08/15/2020   Procedure: ANORECTAL EXAM UNDER ANESTHESIA INCISION AND DRAINAGE OF PERIRECTAL ABSCESS x2 WITH PLACEMENT OF SETON;  Surgeon: Ileana Roup, MD;  Location: [facility];  Service: General;  Laterality: N/A;   OVARIAN CYST REMOVAL Bilateral 1999   PLACEMENT OF SETON  08/15/2020   PLACEMENT OF  SETON N/A 12/08/2020   Procedure: PLACEMENT OF CUTTING SETON; PARTIAL FISTULOTOMY;  Surgeon: Ileana Roup, MD;  Location: [facility];  Service: General;  Laterality: N/A;   RECTAL EXAM UNDER ANESTHESIA N/A 12/08/2020   Procedure: ANORECTAL EXAM UNDER ANESTHESIA;  Surgeon: Ileana Roup, MD;  Location: [facility];  Service: General;  Laterality: N/A;   TOENAIL EXCISION  yrs ago   UNILATERAL SALPINGECTOMY      Family History  Problem Relation Age of Onset   Hypertension Mother    Diabetes Mother    Hypertension Father    Hyperlipidemia Father    Cancer Maternal Grandmother        multiple myaloma, breast cancer   Heart disease  Maternal Grandmother    Diabetes Maternal Grandfather    Cancer Paternal Grandfather        pancreactic   Colon cancer Neg Hx    Stomach cancer Neg Hx    Rectal cancer Neg Hx    Esophageal cancer Neg Hx     Social:  reports that she has been smoking cigarettes. She has a 5.00 pack-year smoking history. She has never used smokeless tobacco. She reports that she does not drink alcohol and does not use drugs.  Allergies:  Allergies  Allergen Reactions   Nsaids Other (See Comments)    HX. OF WEIGHT-LOSS SURGERY    Other Rash    Reaction to LOTIONS    Sulfa Antibiotics Rash   Tramadol Rash    Had after 2014 and tolerated okay    Medications: I have reviewed the patient's current medications.   ROS - all of the below systems have been reviewed with the patient and positives are indicated with bold text General: chills, fever or night sweats Eyes: blurry vision or double vision ENT: epistaxis or sore throat Allergy/Immunology: itchy/watery eyes or nasal congestion Hematologic/Lymphatic: bleeding problems, blood clots or swollen lymph nodes Endocrine: temperature intolerance or unexpected weight changes Breast: new or changing breast lumps or nipple discharge Resp: cough, shortness of breath, or wheezing CV: chest pain or dyspnea on exertion GI: as per HPI GU: dysuria, trouble voiding, or hematuria MSK: joint pain or joint stiffness Neuro: TIA or stroke symptoms Derm: pruritus and skin lesion changes Psych: anxiety and depression  PE Blood pressure (!) 119/47, pulse 87, temperature 98.3 F (36.8 C), temperature source Oral, resp. rate 18, height '5\' 11"'$  (1.803 m), weight 125.7 kg, last menstrual period 03/29/2022, SpO2 98 %. Chaperone present patient's bedside nurse Constitutional: NAD; conversant; no deformities Eyes: Moist conjunctiva; no lid lag; anicteric; PERRL Neck: Trachea midline; no thyromegaly Lungs: Normal respiratory effort; no tactile fremitus CV: RRR; no  palpable thrills; no pitting edema GI: Abd soft, old trocar scars, old lower midline vertical incision, old Pfannenstiel incision; no palpable hepatosplenomegaly Rectal: Visual inspection of the perianal area and buttocks reveals chronic skin thickening around the anus as well as scars.  There is induration and swelling on the right side more so on the left side.  Digital rectal exam was deferred.  There is no overt cellulitis. MSK: Normal gait; no clubbing/cyanosis Psychiatric: Appropriate affect; alert and oriented x3 Lymphatic: No palpable cervical or axillary lymphadenopathy Skin: No rash, lesions or jaundice  Results for orders placed or performed during the hospital encounter of 10/10/22 (from the past 48 hour(s))  CBC     Status: Abnormal   Collection Time: 10/10/22  4:18 PM  Result Value Ref Range   WBC 13.9 (H) 4.0 -  10.5 K/uL   RBC 3.96 3.87 - 5.11 MIL/uL   Hemoglobin 11.1 (L) 12.0 - 15.0 g/dL   HCT 34.5 (L) 36.0 - 46.0 %   MCV 87.1 80.0 - 100.0 fL   MCH 28.0 26.0 - 34.0 pg   MCHC 32.2 30.0 - 36.0 g/dL   RDW 13.8 11.5 - 15.5 %   Platelets 204 150 - 400 K/uL   nRBC 0.0 0.0 - 0.2 %    Comment: Performed at [facility] [address] [place] [employer], [place] [address]  Basic metabolic panel     Status: Abnormal   Collection Time: 10/10/22  4:18 PM  Result Value Ref Range   Sodium 136 135 - 145 mmol/L   Potassium 3.7 3.5 - 5.1 mmol/L   Chloride 107 98 - 111 mmol/L   CO2 23 22 - 32 mmol/L   Glucose, Bld 100 (H) 70 - 99 mg/dL    Comment: Glucose reference range applies only to samples taken after fasting for at least 8 hours.   BUN 8 6 - 20 mg/dL   Creatinine, Ser 0.57 0.44 - 1.00 mg/dL   Calcium 8.2 (L) 8.9 - 10.3 mg/dL   GFR, Estimated >60 >60 mL/min    Comment: (NOTE) Calculated using the CKD-EPI Creatinine Equation (2021)    Anion gap 6 5 - 15    Comment: Performed at [facility] [address], [place], [place] [address]  Type and screen [place]     Status: None   Collection Time: 10/10/22 11:45 PM  Result Value Ref Range   ABO/RH(D) B POS    Antibody Screen NEG    Sample Expiration      10/13/2022,2359 Performed at [facility], [place] [address], [place], [place] [address]     No results found.  Imaging: Personally reviewed  Data reviewed Dr. Orest Dikes operative note from September 2021, January 2022, laparoscopic cholecystectomy op note from 2009, laparoscopic duodenal switch operative note from February 2015; [facility] notes, labs,  A/P: DHRUVI CRENSHAW is an 36 y.o. 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  She is resting comfortably.  She is nontoxic-appearing.  She will need to go the operating room tomorrow with Dr. Donne Hazel for exam under anesthesia, incision and drainage of perirectal abscesses, possible placement of seton  Continue IV antibiotic N.p.o. except meds after 2 AM Continue multivitamin-  Recommend checking vitamin levels since she has a history of duodenal switch and has a history of vitamin deficiencies. Okay for chemical VTE prophylaxis  Discussed the steps of the procedure along with the typical recovery.  Discussed that this would involve probably several incisions and possible leaving of Penrose drains and/or seton if a fistula can be identified.  We discussed that sometimes in the acute inflammation of fistula cannot be identified on exam.  We discussed she will likely need additional procedures.  We discussed the typical recovery.  We discussed potential perioperative fetal issues.  We discussed that fetal heart tones will be checked prior to surgery and then after surgery.  I do believe benefits of surgery outweigh risks to the fetus.  We did discuss that an inadequately  treated infection could actually be worse.  We also discussed that ideally she should be taking a bariatric specific multivitamin  but a Flintstones over-the-counter twice a day it would be better than nothing.  Leighton Ruff. Redmond Pulling, MD, FACS General, Bariatric, & Minimally Invasive Surgery [place]

## 2022-10-11 NOTE — Transfer of Care (Signed)
Immediate Anesthesia Transfer of Care Note  Patient: LUN MURO  Procedure(s) Performed: Incision and drainage of Bilateral Perirectoal abscess, Possible Seton (Bilateral) EXAM UNDER ANESTHESIA (Bilateral)  Patient Location: PACU  Anesthesia Type:General  Level of Consciousness: awake, alert , and oriented  Airway & Oxygen Therapy: Patient Spontanous Breathing  Post-op Assessment: Report given to RN, Post -op Vital signs reviewed and stable, and Patient moving all extremities X 4  Post vital signs: Reviewed and stable  Last Vitals:  Vitals Value Taken Time  BP 121/59 10/11/22 1201  Temp    Pulse 72 10/11/22 1207  Resp 13 10/11/22 1207  SpO2 96 % 10/11/22 1207  Vitals shown include unvalidated device data.  Last Pain:  Vitals:   10/11/22 1200  TempSrc:   PainSc: 9       Patients Stated Pain Goal: 6 (93/11/21 6244)  Complications: No notable events documented.

## 2022-10-11 NOTE — Progress Notes (Addendum)
AP PROGRESS NOTE  S: denies VB, LOF, CTX. Endorses +FM. Notes some discharge from perirectal area around 0200 as in notes but none further at this time. Currently NPO. Reviewed she is scheduled for approx 1015 in main OR. Pain 6/10 but declining meds, feels more pressure than pain. Reviewed that smoking cessation will ideally help tissue healing as well  O: BP (!) 101/30 (BP Location: Left Arm)   Pulse 73   Temp 98.5 F (36.9 C) (Oral)   Resp 18   Ht '5\' 11"'$  (1.803 m)   Wt 125.7 kg   LMP 03/29/2022   SpO2 97%   BMI 38.66 kg/m  Gen: NAD, AAOx3 Abd: gravid fundus GU: deferred  A/P: This is a 36yo [id number] @ 28 0/7 by 1st trim scan presenting with perirectal abscess. Please see H&P for full detail. PNC c/b BMI 39, h/o csx x2, MS, prior gastric surgery (duodenal switch), prior chronic tobacco use  Surgery scheduled as per Gen Surg recs, appreciate assistance. Will follow closely. Please note that patient is [place], may need to take into account regarding anesthesia. Additionally, pre and post procedural monitoring is appropriate at [facility] - no intra-abdominal entry planned.

## 2022-10-11 NOTE — Anesthesia Procedure Notes (Signed)
Procedure Name: Intubation Date/Time: 10/11/2022 11:17 AM  Performed by: Renato Shin, CRNAPre-anesthesia Checklist: Patient identified, Emergency Drugs available, Suction available and Patient being monitored Patient Re-evaluated:Patient Re-evaluated prior to induction Oxygen Delivery Method: Circle system utilized Preoxygenation: Pre-oxygenation with 100% oxygen Induction Type: IV induction Ventilation: Mask ventilation without difficulty Laryngoscope Size: Miller and 3 Grade View: Grade I Tube type: Oral Tube size: 7.0 mm Number of attempts: 1 Airway Equipment and Method: Stylet and Oral airway Placement Confirmation: ETT inserted through vocal cords under direct vision, positive ETCO2 and breath sounds checked- equal and bilateral Secured at: 21 cm Tube secured with: Tape Dental Injury: Teeth and Oropharynx as per pre-operative assessment

## 2022-10-11 NOTE — Progress Notes (Signed)
[facility] called to do post op NST on pt who had an incision of perirectal abscesses.  Pt is G4P2 at 28wks who has no obstetrical complaints at this time.  In PACU, she reports that she is beginning to feel fetal movements again following surgery.  FHR is reassuring for gestation post anesthesia.  Dr Wilhelmenia Blase made aware.  Pt to be moved back to [facility].

## 2022-10-11 NOTE — H&P (Signed)
Please see full note below from Lynn Roberts.  Briefly this is a 36 y.o. [id number] 27w0dwith bilateral recurrent anal abscesses.  Dr. EGreer Pickerelwill see pt in am for operation and gave orders for NPO and Zosyn in meantime.  Pt does not feel a lot of movement usually but has felt some.  No bleeding, LOF or contractions.   Vitals:   10/10/22 1542 10/11/22 0001  BP: 116/65 (!) 119/47  Pulse: 91 87  Resp: 17 18  Temp: 98.2 F (36.8 C) 98.3 F (36.8 C)  TempSrc: Oral Oral  SpO2: 97% 98%  Weight: 125.7 kg   Height: '5\' 11"'$  (1.803 m)     FHTs yesterday 140s, gSTV, NST R No contractions SVE deferred  Pt admitted for preop tonight and OR tomorrow.  MBobbye Roberts      Attestation signed by PDonnamae Jude MD at 10/10/2022 10:46 PM   Attestation of Attending Supervision of Advanced Practitioner (PA/Lynn Roberts/NP): Evaluation and management procedures were performed by the Advanced Practitioner under my supervision and collaboration.  I have reviewed the Advanced Practitioner's note and chart, and I agree with the management and plan.   TDonnamae Jude MD [facility] 10/10/2022 10:46 PM          Expand All Collapse All    History    CSN: [id number]  Arrival date and time: 10/10/22 1518    None         Chief Complaint  Patient presents with   rectal abscess    AAdele Milsonis a 36yo G4P1112 at 236w6d/pregnancy complicated by AMA, tobacco use, obesity, and Hx of bariatric surgery w/B12 and vitamin D deficiencies presenting for painful buttock abscess. She had an horse-shoe anal abscess 2 years ago that required surgical intervention and placement of seton. She thought it had resolved. However, a month ago, the left started to swell again and burst. She was put on clindamycin in the ED, and since it was already draining on its own, they did not do any more procedures. She finished the clindamycin, and around Monday the other side, the right,  started to swell. It's very painful, and she feels like it could burst as well. Does have hemorrhoids, smokes, and has loose stools she attributes to bariatric surgery history. Denies fever, chills, drainage, bleeding.       OB History       Gravida  4   Para  2   Term  1   Preterm  1   AB  1   Living  2        SAB  1   IAB      Ectopic      Multiple  0   Live Births  2                   Past Medical History:  Diagnosis Date   Arthritis of low back     Complication of anesthesia      is of [place]   Family history of adverse reaction to anesthesia      pt is a [place] GERD (gastroesophageal reflux disease)      none since pregnancy    Heart murmur      When pt. was pregnant with her last child, and also had murmur as a child.     Hemorrhoids     History of anal fissures  History of PAT (paroxysmal atrial tachycardia)      had cardiac ablation as a teenager   History of sleep apnea      none since 450 pound weight loss in 2015   Migraines     MS (multiple sclerosis) ([place])     PCOD (polycystic ovarian disease)      no menses           Past Surgical History:  Procedure Laterality Date   BREAST LUMPECTOMY Left 2011    papilloma   BREAST LUMPECTOMY Left 2015    papilloma   BREAST LUMPECTOMY WITH RADIOACTIVE SEED LOCALIZATION Left 10/18/2014    Procedure:  RADIOACTIVE SEED GUIDED EXCISIONAL BREAST BIOPSY;  Surgeon: Alphonsa Overall, MD;  Location: [facility];  Service: General;  Laterality: Left;   BREAST SURGERY Right 07/27/2004    retro-areolar dissection with exc. of large duct   CARDIAC ELECTROPHYSIOLOGY MAPPING AND ABLATION   sge 13   CARDIAC ELECTROPHYSIOLOGY STUDY AND ABLATION   age 50   CESAREAN SECTION N/A 03/28/2020    Procedure: CESAREAN SECTION;  Surgeon: Deliah Boston, MD;  [facility];  Service: Obstetrics;  Laterality: N/A;  RNFA Tracey   CESAREAN SECTION   03/2018   CHOLECYSTECTOMY   04/27/2008    GASTROPLASTY DUODENAL SWITCH   01/2014    done at [place] N/A 08/15/2020    Procedure: ANORECTAL EXAM UNDER ANESTHESIA INCISION AND DRAINAGE OF PERIRECTAL ABSCESS x2 WITH PLACEMENT OF SETON;  Surgeon: Ileana Roup, MD;  [facility];  Service: General;  Laterality: N/A;   OVARIAN CYST REMOVAL Bilateral 1999   PLACEMENT OF SETON   08/15/2020   PLACEMENT OF SETON N/A 12/08/2020    Procedure: PLACEMENT OF CUTTING SETON; PARTIAL FISTULOTOMY;  Surgeon: Ileana Roup, MD;  Location: [facility];  Service: General;  Laterality: N/A;   RECTAL EXAM UNDER ANESTHESIA N/A 12/08/2020    Procedure: ANORECTAL EXAM UNDER ANESTHESIA;  Surgeon: Ileana Roup, MD;  Location: [facility];  Service: General;  Laterality: N/A;   TOENAIL EXCISION   yrs ago   UNILATERAL SALPINGECTOMY               Family History  Problem Relation Age of Onset   Hypertension Mother     Diabetes Mother     Hypertension Father     Hyperlipidemia Father     Cancer Maternal Grandmother          multiple myaloma, breast cancer   Heart disease Maternal Grandmother     Diabetes Maternal Grandfather     Cancer Paternal Grandfather          pancreactic   Colon cancer Neg Hx     Stomach cancer Neg Hx     Rectal cancer Neg Hx     Esophageal cancer Neg Hx        Social History         Tobacco Use   Smoking status: Every Day      Packs/day: 0.50      Years: 10.00      Total pack years: 5.00      Types: Cigarettes   Smokeless tobacco: Never   Tobacco comments:      4-5 cigs per day  Vaping Use   Vaping Use: Never used  Substance Use Topics   Alcohol use: No   Drug use: No  Allergies:       Allergies  Allergen Reactions   Nsaids Other (See Comments)      HX. OF WEIGHT-LOSS SURGERY     Other Rash      Reaction to LOTIONS    Sulfa Antibiotics Rash   Tramadol Rash      Had after 2014 and tolerated okay              Medications Prior to Admission  Medication Sig Dispense Refill Last Dose   aspirin EC 81 MG tablet Take 81 mg by mouth daily. Swallow whole.     10/10/2022   cyanocobalamin (VITAMIN B12) 1000 MCG/ML injection INJECT 1 ML INTO THE SKIN EVERY 4 WEEKS 3 mL 1 Past Week   Glatiramer Acetate (COPAXONE [place]) Inject 40 mg into the skin. Three times a week     Past Week   Glatiramer Acetate 40 MG/ML SOSY Inject into the skin.         nicotine (NICODERM CQ - DOSED IN MG/24 HOURS) 14 mg/24hr patch Place 1 patch (14 mg total) onto the skin daily. 28 patch 1 10/09/2022   sertraline (ZOLOFT) 50 MG tablet Take 1 tablet (50 mg total) by mouth daily. 90 tablet 3 10/10/2022   cholecalciferol (VITAMIN D3) 25 MCG (1000 UNIT) tablet Take 1,000 Units by mouth daily.         clindamycin (CLEOCIN) 300 MG capsule Take 1 capsule (300 mg total) by mouth 4 (four) times daily. 28 capsule 0     Copper Gluconate (COPPER CAPS) 2 MG CAPS Take 1 capsule by mouth daily.         ferrous sulfate 325 (65 FE) MG tablet Take 325 mg by mouth daily with breakfast.         Multiple Vitamin (MULTIVITAMIN WITH MINERALS) TABS tablet Take 1 tablet by mouth daily. 30 tablet 0     nicotine (NICODERM CQ) 7 mg/24hr patch Place 1 patch (7 mg total) onto the skin daily. 28 patch 1     nicotine polacrilex (NICORETTE) 2 MG gum Take 1 each (2 mg total) by mouth as needed for smoking cessation. 50 each 1     Prenat-FeCbn-FeAsp-Meth-FA-DHA (PRENATE MINI) 18-0.6-0.4-350 MG CAPS Take 1 capsule by mouth daily.            Review of Systems Pertinent positives and negative per HPI, all others reviewed and negative   Physical Exam    Blood pressure 116/65, pulse 91, temperature 98.2 F (36.8 C), temperature source Oral, resp. rate 17, height '5\' 11"'$  (1.803 m), weight 125.7 kg, last menstrual period 03/29/2022, SpO2 97 %.   Physical Exam Vitals and nursing note reviewed. Exam conducted with a chaperone present.  Constitutional:      General: She is not in  acute distress.    Appearance: She is obese. She is not ill-appearing, toxic-appearing or diaphoretic.     Comments: Patient has overall grey appearance  HENT:     Head: Normocephalic and atraumatic.  Eyes:     General: No scleral icterus.       Right eye: No discharge.        Left eye: No discharge.     Extraocular Movements: Extraocular movements intact.     Conjunctiva/sclera: Conjunctivae normal.     Pupils: Pupils are equal, round, and reactive to light.  Pulmonary:     Effort: Pulmonary effort is normal.  Genitourinary:    Comments: External hemorrhoids present Indurated but non-erythematous areas bilaterally in the  gluteal cleft. Very tender to palpation, more so on the left Musculoskeletal:     Cervical back: Neck supple.  Skin:    General: Skin is warm and dry.  Neurological:     General: No focal deficit present.     Mental Status: She is alert and oriented to person, place, and time.  Psychiatric:        Mood and Affect: Mood normal.        Behavior: Behavior normal.      Lab Results Last 24 Hours       Results for orders placed or performed during the hospital encounter of 10/10/22 (from the past 24 hour(s))  CBC     Status: Abnormal    Collection Time: 10/10/22  4:18 PM  Result Value Ref Range    WBC 13.9 (H) 4.0 - 10.5 K/uL    RBC 3.96 3.87 - 5.11 MIL/uL    Hemoglobin 11.1 (L) 12.0 - 15.0 g/dL    HCT 34.5 (L) 36.0 - 46.0 %    MCV 87.1 80.0 - 100.0 fL    MCH 28.0 26.0 - 34.0 pg    MCHC 32.2 30.0 - 36.0 g/dL    RDW 13.8 11.5 - 15.5 %    Platelets 204 150 - 400 K/uL    nRBC 0.0 0.0 - 0.2 %  Basic metabolic panel     Status: Abnormal    Collection Time: 10/10/22  4:18 PM  Result Value Ref Range    Sodium 136 135 - 145 mmol/L    Potassium 3.7 3.5 - 5.1 mmol/L    Chloride 107 98 - 111 mmol/L    CO2 23 22 - 32 mmol/L    Glucose, Bld 100 (H) 70 - 99 mg/dL    BUN 8 6 - 20 mg/dL    Creatinine, Ser 0.57 0.44 - 1.00 mg/dL    Calcium 8.2 (L) 8.9 - 10.3 mg/dL     GFR, Estimated >60 >60 mL/min    Anion gap 6 5 - 15        PELVIC MRI Verbal report from Radiologist (full report to follow) Left perirectal abscess with greatest diameter 5.5cm Right Perirectal abscess with greatest diameter 4.1cm New fistula coming from Left   MAU Course  Procedures   MDM   Suspect perianal abscess given symptoms, exam, and history.Clindamycin could be controlling the infection, which might be why the area does not appear very inflamed at the moment.    Consulted general surgery. Will obtain BMP, CBC, and MRI of the pelvis to visualize the area. Treatment would depend on imaging.   Do suspect that patient is immunosuppressed given Hx of smoking and vitamin D deficiency, as well as pregnancy. This is likely contributing to abscess formation. Assessment and Plan  Perianal abscess -imaging pending, discuss results with General Surgery once available     Lynn Roberts 10/10/2022, 7:41 PM          ATTENDING ATTESTATION   I have seen and examined this patient and agree with the above documentation in the resident's note except as below.   I have edited the above note. Signout given to Hansel Feinstein at change of shift.   Clarnce Flock, MD/[facility] (Faculty Practice) 10/10/2022, 8:02 PM      Received verbal report from Radiologist (see above edited note) confirming bilateral perirectal abscesses with new fistula   Consulted General Surgery Greer Pickerel MD) who recommends surgery tomorrow with admission tonight and coverage  with Zosyn.  NPO p MN   Dr Philis Pique updated and will admit patient   Lynn Roberts, Lynn Roberts       Cosigned by: Lynn Jude, MD at 10/10/2022 10:46 PM

## 2022-10-11 NOTE — Consult Note (Signed)
CC: perirectal pain  Requesting provider: Lelan Pons williams   HPI: Lynn Roberts is an 36 y.o. female who is here for worsening perirectal pain.  The patient is currently [redacted] weeks pregnant.  She has a remote history of laparoscopic duodenal switch in 2015 at [facility].  She also has a history of bilateral ischial rectal abscesses.  She underwent incision and drainage of bilateral ischiorectal abscesses with placement of seton for fistula in September 2021 by Dr. Dema Severin.  She subsequently underwent fistulotomy and placement of cutting seton in January 2022.  She states that she has been doing well.  She states about a month ago she noticed some swelling on her left perirectal side.  She states that it ruptured spontaneously and went to the ER and since it was draining no additional maneuvers were done.  She states about 3 days ago she started having right-sided perirectal pain it got worse and she could not tolerate it.  No fever or chills.  No dysuria.   Has not had vitamin lab surveillance.  She states that she is currently taking vitamin D, copper and a Flintstones chewable twice a day  She had an MRI that showed bilateral perirectal abscesses by report.  The right side had an abscess to 5.5 cm & the left 4.1 cm along with a fistula on the left.  We were asked to evaluate.  Past Medical History:  Diagnosis Date   Arthritis of low back    Complication of anesthesia    is of [name] descent   Family history of adverse reaction to anesthesia    pt is a Lumbi [name]   GERD (gastroesophageal reflux disease)    none since pregnancy    Heart murmur    When pt. was pregnant with her last child, and also had murmur as a child.     Hemorrhoids    History of anal fissures    History of PAT (paroxysmal atrial tachycardia)    had cardiac ablation as a teenager   History of sleep apnea    none since 450 pound weight loss in 2015   Migraines    MS (multiple sclerosis) ([facility])    PCOD (polycystic ovarian  disease)    no menses    Past Surgical History:  Procedure Laterality Date   BREAST LUMPECTOMY Left 2011   papilloma   BREAST LUMPECTOMY Left 2015   papilloma   BREAST LUMPECTOMY WITH RADIOACTIVE SEED LOCALIZATION Left 10/18/2014   Procedure:  RADIOACTIVE SEED GUIDED EXCISIONAL BREAST BIOPSY;  Surgeon: Alphonsa Overall, MD;  Location: [facility];  Service: General;  Laterality: Left;   BREAST SURGERY Right 07/27/2004   retro-areolar dissection with exc. of large duct   CARDIAC ELECTROPHYSIOLOGY MAPPING AND ABLATION  sge 13   CARDIAC ELECTROPHYSIOLOGY STUDY AND ABLATION  age 4   CESAREAN SECTION N/A 03/28/2020   Procedure: [place];  Surgeon: Deliah Boston, MD;  [facility];  Service: Obstetrics;  Laterality: N/A;  RNFA Tracey   CESAREAN SECTION  03/2018   CHOLECYSTECTOMY  04/27/2008   GASTROPLASTY DUODENAL SWITCH  01/2014   done at [facility] N/A 08/15/2020   Procedure: ANORECTAL EXAM UNDER ANESTHESIA INCISION AND DRAINAGE OF PERIRECTAL ABSCESS x2 WITH PLACEMENT OF SETON;  Surgeon: Ileana Roup, MD;  [facility];  Service: General;  Laterality: N/A;   OVARIAN CYST REMOVAL Bilateral 1999   PLACEMENT OF SETON  08/15/2020   PLACEMENT OF  SETON N/A 12/08/2020   Procedure: PLACEMENT OF CUTTING SETON; PARTIAL FISTULOTOMY;  Surgeon: Ileana Roup, MD;  Location: [facility];  Service: General;  Laterality: N/A;   RECTAL EXAM UNDER ANESTHESIA N/A 12/08/2020   Procedure: ANORECTAL EXAM UNDER ANESTHESIA;  Surgeon: Ileana Roup, MD;  Location: [facility];  Service: General;  Laterality: N/A;   TOENAIL EXCISION  yrs ago   UNILATERAL SALPINGECTOMY      Family History  Problem Relation Age of Onset   Hypertension Mother    Diabetes Mother    Hypertension Father    Hyperlipidemia Father    Cancer Maternal Grandmother        multiple myaloma, breast cancer   Heart disease  Maternal Grandmother    Diabetes Maternal Grandfather    Cancer Paternal Grandfather        pancreactic   Colon cancer Neg Hx    Stomach cancer Neg Hx    Rectal cancer Neg Hx    Esophageal cancer Neg Hx     Social:  reports that she has been smoking cigarettes. She has a 5.00 pack-year smoking history. She has never used smokeless tobacco. She reports that she does not drink alcohol and does not use drugs.  Allergies:  Allergies  Allergen Reactions   Nsaids Other (See Comments)    HX. OF WEIGHT-LOSS SURGERY    Other Rash    Reaction to LOTIONS    Sulfa Antibiotics Rash   Tramadol Rash    Had after 2014 and tolerated okay    Medications: I have reviewed the patient's current medications.   ROS - all of the below systems have been reviewed with the patient and positives are indicated with bold text General: chills, fever or night sweats Eyes: blurry vision or double vision ENT: epistaxis or sore throat Allergy/Immunology: itchy/watery eyes or nasal congestion Hematologic/Lymphatic: bleeding problems, blood clots or swollen lymph nodes Endocrine: temperature intolerance or unexpected weight changes Breast: new or changing breast lumps or nipple discharge Resp: cough, shortness of breath, or wheezing CV: chest pain or dyspnea on exertion GI: as per HPI GU: dysuria, trouble voiding, or hematuria MSK: joint pain or joint stiffness Neuro: TIA or stroke symptoms Derm: pruritus and skin lesion changes Psych: anxiety and depression  PE Blood pressure (!) 119/47, pulse 87, temperature 98.3 F (36.8 C), temperature source Oral, resp. rate 18, height '5\' 11"'$  (1.803 m), weight 125.7 kg, last menstrual period 03/29/2022, SpO2 98 %. Chaperone present patient's bedside nurse Constitutional: NAD; conversant; no deformities Eyes: Moist conjunctiva; no lid lag; anicteric; PERRL Neck: Trachea midline; no thyromegaly Lungs: Normal respiratory effort; no tactile fremitus CV: RRR; no  palpable thrills; no pitting edema GI: Abd soft, old trocar scars, old lower midline vertical incision, old Pfannenstiel incision; no palpable hepatosplenomegaly Rectal: Visual inspection of the perianal area and buttocks reveals chronic skin thickening around the anus as well as scars.  There is induration and swelling on the right side more so on the left side.  Digital rectal exam was deferred.  There is no overt cellulitis. MSK: Normal gait; no clubbing/cyanosis Psychiatric: Appropriate affect; alert and oriented x3 Lymphatic: No palpable cervical or axillary lymphadenopathy Skin: No rash, lesions or jaundice  Results for orders placed or performed during the hospital encounter of 10/10/22 (from the past 48 hour(s))  CBC     Status: Abnormal   Collection Time: 10/10/22  4:18 PM  Result Value Ref Range   WBC 13.9 (H) 4.0 -  10.5 K/uL   RBC 3.96 3.87 - 5.11 MIL/uL   Hemoglobin 11.1 (L) 12.0 - 15.0 g/dL   HCT 34.5 (L) 36.0 - 46.0 %   MCV 87.1 80.0 - 100.0 fL   MCH 28.0 26.0 - 34.0 pg   MCHC 32.2 30.0 - 36.0 g/dL   RDW 13.8 11.5 - 15.5 %   Platelets 204 150 - 400 K/uL   nRBC 0.0 0.0 - 0.2 %    Comment: Performed at [facility] [address] [place], [place] [address]  Basic metabolic panel     Status: Abnormal   Collection Time: 10/10/22  4:18 PM  Result Value Ref Range   Sodium 136 135 - 145 mmol/L   Potassium 3.7 3.5 - 5.1 mmol/L   Chloride 107 98 - 111 mmol/L   CO2 23 22 - 32 mmol/L   Glucose, Bld 100 (H) 70 - 99 mg/dL    Comment: Glucose reference range applies only to samples taken after fasting for at least 8 hours.   BUN 8 6 - 20 mg/dL   Creatinine, Ser 0.57 0.44 - 1.00 mg/dL   Calcium 8.2 (L) 8.9 - 10.3 mg/dL   GFR, Estimated >60 >60 mL/min    Comment: (NOTE) Calculated using the CKD-EPI Creatinine Equation (2021)    Anion gap 6 5 - 15    Comment: Performed at [facility] [address] [place], [place] [address]  Type and screen [place]     Status: None   Collection Time: 10/10/22 11:45 PM  Result Value Ref Range   ABO/RH(D) B POS    Antibody Screen NEG    Sample Expiration      10/13/2022,2359 Performed at [facility], [place] [address], [place], [place] [address]     No results found.  Imaging: Personally reviewed  Data reviewed Dr. Orest Dikes operative note from September 2021, January 2022, laparoscopic cholecystectomy op note from 2009, laparoscopic duodenal switch operative note from February 2015; MAU notes, labs,  A/P: SOFHIA ULIBARRI is an 36 y.o. 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  She is resting comfortably.  She is nontoxic-appearing.  She will need to go the operating room tomorrow with Dr. Donne Hazel for exam under anesthesia, incision and drainage of perirectal abscesses, possible placement of seton  Continue IV antibiotic N.p.o. except meds after 2 AM Continue multivitamin-  Recommend checking vitamin levels since she has a history of duodenal switch and has a history of vitamin deficiencies. Okay for chemical VTE prophylaxis  Discussed the steps of the procedure along with the typical recovery.  Discussed that this would involve probably several incisions and possible leaving of Penrose drains and/or seton if a fistula can be identified.  We discussed that sometimes in the acute inflammation of fistula cannot be identified on exam.  We discussed she will likely need additional procedures.  We discussed the typical recovery.  We discussed potential perioperative fetal issues.  We discussed that fetal heart tones will be checked prior to surgery and then after surgery.  I do believe benefits of surgery outweigh risks to the fetus.  We did discuss that an inadequately  treated infection could actually be worse.  We also discussed that ideally she should be taking a bariatric specific multivitamin  but a Flintstones over-the-counter twice a day it would be better than nothing.  Leighton Ruff. Redmond Pulling, MD, FACS General, Bariatric, & Minimally Invasive Surgery [place]

## 2022-10-11 NOTE — Anesthesia Postprocedure Evaluation (Signed)
Anesthesia Post Note  Patient: Lynn Roberts  Procedure(s) Performed: Incision and drainage of Bilateral Perirectoal abscess, Possible Seton (Bilateral) EXAM UNDER ANESTHESIA (Bilateral)     Patient location during evaluation: PACU Anesthesia Type: General Level of consciousness: awake and alert, patient cooperative and oriented Pain management: pain level controlled Vital Signs Assessment: post-procedure vital signs reviewed and stable Respiratory status: spontaneous breathing, nonlabored ventilation and respiratory function stable Cardiovascular status: blood pressure returned to baseline and stable Postop Assessment: no apparent nausea or vomiting Anesthetic complications: no   No notable events documented.  Last Vitals:  Vitals:   10/11/22 1245 10/11/22 1317  BP: (!) 113/56 (!) 114/57  Pulse: 65 67  Resp: 19 16  Temp: 36.9 C 36.8 C  SpO2: 99% 100%    Last Pain:  Vitals:   10/11/22 1317  TempSrc: Oral  PainSc:                  Pessy Delamar,E. Kimani Bedoya

## 2022-10-12 ENCOUNTER — Encounter (HOSPITAL_COMMUNITY): Payer: Self-pay | Admitting: General Surgery

## 2022-10-12 LAB — CBC
HCT: 32 % — ABNORMAL LOW (ref 36.0–46.0)
Hemoglobin: 10.5 g/dL — ABNORMAL LOW (ref 12.0–15.0)
MCH: 28.5 pg (ref 26.0–34.0)
MCHC: 32.8 g/dL (ref 30.0–36.0)
MCV: 86.7 fL (ref 80.0–100.0)
Platelets: 183 10*3/uL (ref 150–400)
RBC: 3.69 MIL/uL — ABNORMAL LOW (ref 3.87–5.11)
RDW: 13.7 % (ref 11.5–15.5)
WBC: 11.7 10*3/uL — ABNORMAL HIGH (ref 4.0–10.5)
nRBC: 0 % (ref 0.0–0.2)

## 2022-10-12 MED ORDER — SERTRALINE HCL 50 MG PO TABS
50.0000 mg | ORAL_TABLET | Freq: Every day | ORAL | Status: DC
  Administered 2022-10-12 – 2022-10-13 (×2): 50 mg via ORAL
  Filled 2022-10-12 (×2): qty 1

## 2022-10-12 MED ORDER — ASPIRIN 81 MG PO CHEW
81.0000 mg | CHEWABLE_TABLET | Freq: Every day | ORAL | Status: DC
  Administered 2022-10-12 – 2022-10-13 (×2): 81 mg via ORAL
  Filled 2022-10-12 (×2): qty 1

## 2022-10-12 MED ORDER — NICOTINE 14 MG/24HR TD PT24
14.0000 mg | MEDICATED_PATCH | Freq: Every day | TRANSDERMAL | Status: DC
  Administered 2022-10-12 – 2022-10-13 (×2): 14 mg via TRANSDERMAL
  Filled 2022-10-12 (×2): qty 1

## 2022-10-12 MED ORDER — COPPER 2 MG PO TABS
2.0000 mg | Freq: Every day | Status: DC
  Administered 2022-10-12 – 2022-10-13 (×2): 2 mg via ORAL
  Filled 2022-10-12 (×2): qty 1

## 2022-10-12 MED ORDER — FAMOTIDINE 20 MG PO TABS
20.0000 mg | ORAL_TABLET | Freq: Two times a day (BID) | ORAL | Status: DC | PRN
  Administered 2022-10-12 – 2022-10-13 (×2): 20 mg via ORAL
  Filled 2022-10-12 (×2): qty 1

## 2022-10-12 MED ORDER — ENOXAPARIN SODIUM 40 MG/0.4ML IJ SOSY
40.0000 mg | PREFILLED_SYRINGE | INTRAMUSCULAR | Status: DC
  Administered 2022-10-12: 40 mg via SUBCUTANEOUS
  Filled 2022-10-12: qty 0.4

## 2022-10-12 MED ORDER — FERROUS SULFATE 325 (65 FE) MG PO TABS
325.0000 mg | ORAL_TABLET | Freq: Every day | ORAL | Status: DC
  Administered 2022-10-13: 325 mg via ORAL
  Filled 2022-10-12: qty 1

## 2022-10-12 NOTE — Progress Notes (Signed)
RN called to room and the patient is in the bathroom. The wound packing is hanging out of the incision on her left buttock by nearly 12 inches. RN calling the surgical team to ask if it should be pulled out of cut off. The packing is wet with urine. Message left with answering service at [facility].

## 2022-10-12 NOTE — Progress Notes (Signed)
[facility] Progress Note  1 Day Post-Op  Subjective: CC:  No new complaints - reports decreased perianal pressure but still with mild pain. Has been walking in hallway without dizziness. Denies BM since surgery. Denies nausea or vomiting.  Objective: Vital signs in last 24 hours: Temp:  [97.5 F (36.4 C)-98.6 F (37 C)] 98 F (36.7 C) (11/11 0819) Pulse Rate:  [52-81] 60 (11/11 0819) Resp:  [14-20] 14 (11/11 0819) BP: (84-121)/(47-81) 115/50 (11/11 0819) SpO2:  [94 %-100 %] 100 % (11/11 0819) Weight:  [125.8 kg] 125.8 kg (11/10 1040)    Intake/Output from previous day: 11/10 0701 - 11/11 0700 In: 700 [I.V.:700] Out: 425 [Urine:400; Blood:25] Intake/Output this shift: No intake/output data recorded.  PE: Gen:  Alert, NAD, pleasant Abd: NST placed by RN GU: 2 small incisions L peri-anal region with iodoform packing in place, one larger incision right perianal region with penrose sutured in place, mild induration medial to penrose, otherwise soft and no fluctuance. No cellulitis.  Skin: warm and dry, no rashes  Psych: A&Ox3   Lab Results:  Recent Labs    10/10/22 1618 10/12/22 0812  WBC 13.9* 11.7*  HGB 11.1* 10.5*  HCT 34.5* 32.0*  PLT 204 183   BMET Recent Labs    10/10/22 1618  NA 136  K 3.7  CL 107  CO2 23  GLUCOSE 100*  BUN 8  CREATININE 0.57  CALCIUM 8.2*   PT/INR No results for input(s): "LABPROT", "INR" in the last 72 hours. CMP     Component Value Date/Time   NA 136 10/10/2022 1618   NA 143 06/20/2021 1539   K 3.7 10/10/2022 1618   CL 107 10/10/2022 1618   CO2 23 10/10/2022 1618   GLUCOSE 100 (H) 10/10/2022 1618   BUN 8 10/10/2022 1618   BUN 9 06/20/2021 1539   CREATININE 0.57 10/10/2022 1618   CREATININE 0.62 03/03/2020 1452   CALCIUM 8.2 (L) 10/10/2022 1618   PROT 6.1 12/26/2021 1634   ALBUMIN 3.6 (L) 12/26/2021 1634   AST 14 12/26/2021 1634   AST 10 (L) 03/03/2020 1452   ALT 11 12/26/2021 1634   ALT 10 03/03/2020  1452   ALKPHOS 55 12/26/2021 1634   BILITOT 0.3 12/26/2021 1634   BILITOT 0.4 03/03/2020 1452   GFRNONAA >60 10/10/2022 1618   GFRNONAA >60 03/03/2020 1452   GFRAA >60 08/15/2020 0354   GFRAA >60 03/03/2020 1452   Lipase     Component Value Date/Time   LIPASE 22 10/28/2016 1622       Studies/Results: MR PELVIS WO CONTRAST  Result Date: 10/11/2022 CLINICAL DATA:  Anal/rectal abscess.  Patient is [redacted] weeks pregnant. EXAM: MRI PELVIS WITHOUT CONTRAST TECHNIQUE: Multiplanar multisequence MR imaging of the pelvis was performed. No intravenous contrast was administered. COMPARISON:  None Available. FINDINGS: Urinary Tract:  Unremarkable. Bowel: Extensive perianal edema/inflammation involving the ischial anal fat bilaterally and extending posteriorly into the buttocks region bilaterally. Although assessment is limited by inability to administer gadolinium in this pregnant patient, there does appear to be a transsphincteric fistula arising from approximately the 5-6 o'clock position of the anal wall. This is visible on axial T2 fat suppressed image 21 of series 8. Posterior into the left of the anus, the fistula bifurcates tracking both to the left and then crossing the midline to the right (also axial 22/8 and visible tracking to the right on coronal 19/9). From the bifurcation, the fistula track extends to the left and posteriorly (images 22-27/series  8 and well demonstrated on coronal T2 image 20 of series 9) into the ischioanal fat where a 3.1 x 2.8 x 3.5 cm mildly heterogeneous fluid collection is identified. Although assessment is limited by lack of contrast, this does have imaging features compatible with abscess. This is just deep to the skin on the left side of the intergluteal fold and communication to the skin at this location is not excluded. From the bifurcation of the fistula posterior to the anus, the right-sided track courses to the right (coronal 19/9 and axial 45/5) into a bilobed  fluid collection (sagittal image 22 of series 12 and axial 17/9) measuring 3.6 x 6.7 x 4.2 cm. This collection is extends from the deep ischioanal fat posteriorly and medially to the subcutaneous tissues deep to the right-sided skin of the intergluteal fold. Vascular/Lymphatic: No pathologically enlarged lymph nodes. No significant vascular abnormality seen. Reproductive: Single intrauterine gestation evident within the visualized uterus. Other:  No substantial free fluid noted in the pelvis. Musculoskeletal: No suspicious bone lesions identified. IMPRESSION: 1. Extensive perianal edema/inflammation involving the ischioanal fat bilaterally and extending posteriorly into the buttocks region bilaterally. Although assessment is limited by lack of contrast in this pregnant patient, there is a transsphincteric fistula arising from approximately the 5-6 o'clock position of the anal wall. The fistula bifurcates early, tracking both to the left and the right. Left-sided fistula track communicates with a 3.1 x 2.8 x 3.5 cm mildly heterogeneous fluid collection compatible with abscess. The right-sided branch courses to a bilobed fluid collection measuring 3.6 x 6.7 x 4.2 cm, also compatible with an abscess. Electronically Signed   By: Misty Stanley M.D.   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 Donne Hazel  - afebrile, WBC 11 from 13  - GS with GPC, GNR, follow cultures - plan to D/C packing tomorrow. Pt may shower starting tomorrow.  - mobilize - will ultimately need outpatient follow up with colorectal surgery for fistula   FEN:  Reg ID: Zosyn VTE: SCD's, ok for DVT ppx with daily lovenox from surgical perspective    LOS: 2 days   I reviewed nursing notes, hospitalist notes, last 24 h vitals and pain scores, last 48 h intake and output, last 24 h labs and trends, and last 24 h imaging results.    Obie Dredge, PA-C [facility] Please see Amion for pager number during day hours 7:00am-4:30pm

## 2022-10-12 NOTE — Progress Notes (Addendum)
36 y.o. [id number] 41w1dHD#2 admitted for Perirectal abscess [K61.1].  Reports pain much improved s/p surgery yesterday No pregnancy / obstetric concerns  Vitals:   10/12/22 0255 10/12/22 0819 10/12/22 1325 10/12/22 1528  BP: 102/81 (!) 115/50 (!) 108/57 113/64  Pulse: (!) 59 60 74 (!) 58  Resp: '18 14 17 18  '$ Temp: 98.1 F (36.7 C) 98 F (36.7 C) 97.7 F (36.5 C) 98.5 F (36.9 C)  TempSrc:  Oral Oral Oral  SpO2: 99% 100% 100% 100%  Weight:      Height:       NAD Abd  Soft, gravid, nontender Ext:  SCDs not on, no LE edema FHTs  120s, moderate variability, + accels, no decels Toco  quiet  Results for orders placed or performed during the hospital encounter of 10/10/22 (from the past 24 hour(s))  CBC     Status: Abnormal   Collection Time: 10/12/22  8:12 AM  Result Value Ref Range   WBC 11.7 (H) 4.0 - 10.5 K/uL   RBC 3.69 (L) 3.87 - 5.11 MIL/uL   Hemoglobin 10.5 (L) 12.0 - 15.0 g/dL   HCT 32.0 (L) 36.0 - 46.0 %   MCV 86.7 80.0 - 100.0 fL   MCH 28.5 26.0 - 34.0 pg   MCHC 32.8 30.0 - 36.0 g/dL   RDW 13.7 11.5 - 15.5 %   Platelets 183 150 - 400 K/uL   nRBC 0.0 0.0 - 0.2 %    A:  36yo [id number]@ 222w1dD#2  POD#1 s/p I&D of bilateral perirectal abscesses with placement of penrose drain P: Perirectal abscess:  dispo per surgery, awaiting cultures, continued on zosym Pregnancy incidental to admission.  Home meds / vitamins continued.  Daily NST while inpatient.  Per surgery, ok to start ppx lovenox today.  Patient inconsistently wearing scds.  Will order  [place]

## 2022-10-13 ENCOUNTER — Other Ambulatory Visit: Payer: Self-pay | Admitting: Neurology

## 2022-10-13 ENCOUNTER — Other Ambulatory Visit: Payer: Self-pay | Admitting: Cardiovascular Disease

## 2022-10-13 MED ORDER — FAMOTIDINE 20 MG PO TABS: 20.0000 mg | ORAL_TABLET | Freq: Two times a day (BID) | ORAL | 0 refills | Status: DC | PRN

## 2022-10-13 MED ORDER — OXYCODONE HCL 5 MG PO TABS: 5.0000 mg | ORAL_TABLET | Freq: Four times a day (QID) | ORAL | 0 refills | Status: DC | PRN

## 2022-10-13 MED ORDER — AMOXICILLIN-POT CLAVULANATE 875-125 MG PO TABS: 1.0000 | ORAL_TABLET | Freq: Two times a day (BID) | ORAL | 0 refills | Status: DC

## 2022-10-13 NOTE — Progress Notes (Signed)
36 y.o. [id number] 5w1dHD#3 admitted for Perirectal abscess [K61.1].    Vitals:   10/12/22 1528 10/12/22 2000 10/12/22 2222 10/13/22 0817  BP: 113/64 105/65 112/60 101/62  Pulse: (!) 58 64 (!) 59 69  Resp: '18 18 18 16  '$ Temp: 98.5 F (36.9 C) 98.4 F (36.9 C) 97.9 F (36.6 C) 97.7 F (36.5 C)  TempSrc: Oral Oral Oral Oral  SpO2: 100% 99% 100%   Weight:      Height:       NAD Abd  Soft, gravid, nontender Ext:  SCDs not on, no LE edema FHTs  130s, moderate variability Toco  quiet  No results found for this or any previous visit (from the past 24 hour(s)).   A:  89@ 268w1dD#3  POD#1 s/p I&D of bilateral perirectal abscesses with placement of penrose drain P: Perirectal abscess:  dispo per surgery--ok for discharge today.  Clarified with LiLovell Sheehanec total of 7 days of augmentin 875 mg BID. She will monitor culture for final sensitivities and contact the patient directly if antibiotics need to be adjusted F/u with general surgery scheduled by team and added to discharge summary.   Patient request rx for famotidine as well  [place]

## 2022-10-13 NOTE — Progress Notes (Signed)
[facility] Progress Note  2 Days Post-Op  Subjective: CC:  Feels her pain is overall improving. Was able to have a BM. Tolerating PO. Employed as a Occupational psychologist.   Objective: Vital signs in last 24 hours: Temp:  [97.7 F (36.5 C)-98.5 F (36.9 C)] 97.7 F (36.5 C) (11/12 0817) Pulse Rate:  [58-74] 69 (11/12 0817) Resp:  [16-18] 16 (11/12 0817) BP: (101-113)/(57-65) 101/62 (11/12 0817) SpO2:  [99 %-100 %] 100 % (11/11 2222)    Intake/Output from previous day: No intake/output data recorded. Intake/Output this shift: No intake/output data recorded.  PE: Gen:  Alert, NAD, pleasant Abd: NST placed by RN GU: all packing was removed without complication. The penrose had fallen out. I was able to re-insert this successfully using a cotton tip applicator. There is no cellulitis, fluctuance, or induration. No bleeding. Skin: warm and dry, no rashes  Psych: A&Ox3   Lab Results:  Recent Labs    10/10/22 1618 10/12/22 0812  WBC 13.9* 11.7*  HGB 11.1* 10.5*  HCT 34.5* 32.0*  PLT 204 183   BMET Recent Labs    10/10/22 1618  NA 136  K 3.7  CL 107  CO2 23  GLUCOSE 100*  BUN 8  CREATININE 0.57  CALCIUM 8.2*   PT/INR No results for input(s): "LABPROT", "INR" in the last 72 hours. CMP     Component Value Date/Time   NA 136 10/10/2022 1618   NA 143 06/20/2021 1539   K 3.7 10/10/2022 1618   CL 107 10/10/2022 1618   CO2 23 10/10/2022 1618   GLUCOSE 100 (H) 10/10/2022 1618   BUN 8 10/10/2022 1618   BUN 9 06/20/2021 1539   CREATININE 0.57 10/10/2022 1618   CREATININE 0.62 03/03/2020 1452   CALCIUM 8.2 (L) 10/10/2022 1618   PROT 6.1 12/26/2021 1634   ALBUMIN 3.6 (L) 12/26/2021 1634   AST 14 12/26/2021 1634   AST 10 (L) 03/03/2020 1452   ALT 11 12/26/2021 1634   ALT 10 03/03/2020 1452   ALKPHOS 55 12/26/2021 1634   BILITOT 0.3 12/26/2021 1634   BILITOT 0.4 03/03/2020 1452   GFRNONAA >60 10/10/2022 1618   GFRNONAA >60 03/03/2020 1452   GFRAA >60  08/15/2020 0354   GFRAA >60 03/03/2020 1452   Lipase     Component Value Date/Time   LIPASE 22 10/28/2016 1622       Studies/Results: No results found.  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 Donne Hazel  - afebrile, WBC 11 yesterday from 13  - Cx with E. Coli, sensitivities pending. - packing removed today. Patient may shower. Recommend sitz baths TID and PRN after BMs.  - mobilize - wound check in our office has been arranged in about 2 weeks. Work note provided for patient. will ultimately need outpatient follow up with colorectal surgery for fistula.  - patient is stable for discharge today from a surgical standpoint. Would recommend completing a total of 7 days of antibiotics- typically we would recommend Augmentin for this, and change as needed based on sensitivities. Defer choice of PO antibiotic to OBGYN based  on pregnancy risk. Should continue sitz baths at home, this was discussed with the patient. Concerning signs and return precautions discussed with the patient.   FEN: Reg ID: Zosyn VTE: SCD's, ok for DVT ppx with daily lovenox from surgical perspective    LOS: 3 days   I reviewed nursing notes, hospitalist notes, last 24 h vitals and pain scores, last 48 h intake and output, last 24 h labs and trends, and last 24 h imaging results.    Obie Dredge, PA-C [facility] Please see Amion for pager number during day hours 7:00am-4:30pm

## 2022-10-14 LAB — AEROBIC/ANAEROBIC CULTURE W GRAM STAIN (SURGICAL/DEEP WOUND)

## 2022-10-14 NOTE — Discharge Summary (Signed)
Postpartum Discharge Summary  Date of Service updated 10/13/22     Patient Name: Lynn Roberts DOB: [birth year] MRN: [id number]  Date of admission: 10/10/2022 Delivery date:This patient has no babies on file. Delivering provider: This patient has no babies on file. Date of discharge: 10/14/2022  Admitting diagnosis: Perirectal abscess [K61.1] Intrauterine pregnancy: [redacted]w[redacted]d    Secondary diagnosis:  Principal Problem:   Perirectal abscess  Additional problems: Pregnancy INCIDENTAL to admission   Discharge diagnosis: Perirectal abscess                            Hospital course: [id number] 28 weeks presented with bilateral perirectal abscesses.  She has a history of recurrent perirectal abscesses.  Due to her gestational age > 20 weeks, she was admitted to the antenatal unit, though her pregnancy was incidental to admission.  She was seen and managed by general surgery for I&D of bilateral perirectal abscess with penrose drain placement.  She received 2 days of IV zosyn.  On post op day #2, cultures grew out e.coli.  Discharge on augmentin bid x 7 days was recommended by general surgery with close outpatient follow up by surgery.  They will be monitoring the final culture result to ensure that antibiotic therapy does not need to be adjusted as sensitivities were still pending on day of discharge.  Fetal status was reassuring throughout her stay  Physical exam  Vitals:   10/12/22 1528 10/12/22 2000 10/12/22 2222 10/13/22 0817  BP: 113/64 105/65 112/60 101/62  Pulse: (!) 58 64 (!) 59 69  Resp: '18 18 18 16  '$ Temp: 98.5 F (36.9 C) 98.4 F (36.9 C) 97.9 F (36.6 C) 97.7 F (36.5 C)  TempSrc: Oral Oral Oral Oral  SpO2: 100% 99% 100%   Weight:      Height:       General: alert, cooperative, and no distress Abdomen: soft, gravid DVT Evaluation: No evidence of DVT seen on physical exam. Labs: Lab Results  Component Value Date   WBC 11.7 (H) 10/12/2022   HGB 10.5 (L) 10/12/2022    HCT 32.0 (L) 10/12/2022   MCV 86.7 10/12/2022   PLT 183 10/12/2022      Latest Ref Rng & Units 10/10/2022    4:18 PM  CMP  Glucose 70 - 99 mg/dL 100   BUN 6 - 20 mg/dL 8   Creatinine 0.44 - 1.00 mg/dL 0.57   Sodium 135 - 145 mmol/L 136   Potassium 3.5 - 5.1 mmol/L 3.7   Chloride 98 - 111 mmol/L 107   CO2 22 - 32 mmol/L 23   Calcium 8.9 - 10.3 mg/dL 8.2    Edinburgh Score:    03/29/2020    1:30 AM  Edinburgh Postnatal Depression Scale Screening Tool  I have been able to laugh and see the funny side of things. 0  I have looked forward with enjoyment to things. 0  I have blamed myself unnecessarily when things went wrong. 1  I have been anxious or worried for no good reason. 1  I have felt scared or panicky for no good reason. 0  Things have been getting on top of me. 1  I have been so unhappy that I have had difficulty sleeping. 0  I have felt sad or miserable. 0  I have been so unhappy that I have been crying. 0  The thought of harming myself has occurred to me. 0  Edinburgh Postnatal Depression Scale Total 3      After visit meds:  Allergies as of 10/13/2022       Reactions   Nsaids Other (See Comments)   HX. OF WEIGHT-LOSS SURGERY   Other Rash   Reaction to LOTIONS    Sulfa Antibiotics Rash   Tramadol Rash   Had after 2014 and tolerated okay        Medication List     STOP taking these medications    clindamycin 300 MG capsule Commonly known as: Cleocin       TAKE these medications    amoxicillin-clavulanate 875-125 MG tablet Commonly known as: AUGMENTIN Take 1 tablet by mouth 2 (two) times daily.   aspirin EC 81 MG tablet Take 81 mg by mouth daily. Swallow whole.   cholecalciferol 25 MCG (1000 UNIT) tablet Commonly known as: VITAMIN D3 Take 1,000 Units by mouth daily.   COPAXONE Energy Inject 40 mg into the skin. Three times a week   Glatiramer Acetate 40 MG/ML Sosy Inject into the skin.   Copper Caps 2 MG Caps Generic drug: Copper  Gluconate Take 1 capsule by mouth daily.   cyanocobalamin 1000 MCG/ML injection Commonly known as: VITAMIN B12 INJECT 1 ML INTO THE SKIN EVERY 4 WEEKS   famotidine 20 MG tablet Commonly known as: PEPCID Take 1 tablet (20 mg total) by mouth 2 (two) times daily as needed for heartburn.   ferrous sulfate 325 (65 FE) MG tablet Take 325 mg by mouth daily with breakfast.   multivitamin with minerals Tabs tablet Take 1 tablet by mouth daily.   nicotine 7 mg/24hr patch Commonly known as: Nicoderm CQ Place 1 patch (7 mg total) onto the skin daily. What changed: Another medication with the same name was removed. Continue taking this medication, and follow the directions you see here.   nicotine polacrilex 2 MG gum Commonly known as: NICORETTE Take 1 each (2 mg total) by mouth as needed for smoking cessation.   oxyCODONE 5 MG immediate release tablet Commonly known as: Oxy IR/ROXICODONE Take 1-2 tablets (5-10 mg total) by mouth every 6 (six) hours as needed for moderate pain.   Prenate Mini 18-0.6-0.4-350 MG Caps Take 1 capsule by mouth daily.   sertraline 50 MG tablet Commonly known as: Zoloft Take 1 tablet (50 mg total) by mouth daily.          Future Appointments: Future Appointments  Date Time Provider [place]  10/23/2022 12:30 PM [facility] NURSE [facility]  10/23/2022 12:45 PM [facility]  11/27/2022  3:00 PM Sater, Nanine Means, MD [facility] None   Follow up Visit:  Follow-up Information     Maczis, Carlena Hurl, PA-C Follow up on 10/29/2022.   Specialty: General Surgery Why: 11:45 am, Arrive 30 minutes prior to your appointment time, Please bring your insurance card and photo ID Contact information: [place] [address] [phone]                     10/14/2022 [facility] GEFFEL Carlis Abbott, MD

## 2022-10-15 LAB — VITAMIN B1: Vitamin B1 (Thiamine): 121.5 nmol/L (ref 66.5–200.0)

## 2022-10-17 LAB — VITAMIN A: Vitamin A (Retinoic Acid): 7.1 ug/dL — ABNORMAL LOW (ref 18.9–57.3)

## 2022-10-22 ENCOUNTER — Encounter: Payer: Self-pay | Admitting: Hematology and Oncology

## 2022-10-22 LAB — COPPER, SERUM: Copper: 144 ug/dL (ref 80–158)

## 2022-10-23 ENCOUNTER — Other Ambulatory Visit: Payer: Self-pay | Admitting: *Deleted

## 2022-10-23 ENCOUNTER — Ambulatory Visit: Payer: 59 | Admitting: *Deleted

## 2022-10-23 ENCOUNTER — Ambulatory Visit: Payer: 59 | Attending: Obstetrics

## 2022-10-23 VITALS — BP 113/63 | HR 72

## 2022-10-23 DIAGNOSIS — O34219 Maternal care for unspecified type scar from previous cesarean delivery: Secondary | ICD-10-CM | POA: Insufficient documentation

## 2022-10-23 DIAGNOSIS — O09891 Supervision of other high risk pregnancies, first trimester: Secondary | ICD-10-CM | POA: Diagnosis not present

## 2022-10-23 DIAGNOSIS — O09522 Supervision of elderly multigravida, second trimester: Secondary | ICD-10-CM | POA: Diagnosis not present

## 2022-10-23 DIAGNOSIS — Z3689 Encounter for other specified antenatal screening: Secondary | ICD-10-CM | POA: Diagnosis not present

## 2022-10-23 DIAGNOSIS — R638 Other symptoms and signs concerning food and fluid intake: Secondary | ICD-10-CM | POA: Insufficient documentation

## 2022-10-23 DIAGNOSIS — Z3A29 29 weeks gestation of pregnancy: Secondary | ICD-10-CM | POA: Diagnosis not present

## 2022-10-23 DIAGNOSIS — Z362 Encounter for other antenatal screening follow-up: Secondary | ICD-10-CM | POA: Diagnosis not present

## 2022-10-23 DIAGNOSIS — O99213 Obesity complicating pregnancy, third trimester: Secondary | ICD-10-CM | POA: Diagnosis not present

## 2022-10-23 DIAGNOSIS — O99212 Obesity complicating pregnancy, second trimester: Secondary | ICD-10-CM | POA: Diagnosis not present

## 2022-10-23 DIAGNOSIS — E669 Obesity, unspecified: Secondary | ICD-10-CM | POA: Diagnosis not present

## 2022-10-23 DIAGNOSIS — O99352 Diseases of the nervous system complicating pregnancy, second trimester: Secondary | ICD-10-CM | POA: Insufficient documentation

## 2022-10-23 DIAGNOSIS — O09523 Supervision of elderly multigravida, third trimester: Secondary | ICD-10-CM | POA: Diagnosis not present

## 2022-10-23 DIAGNOSIS — O99843 Bariatric surgery status complicating pregnancy, third trimester: Secondary | ICD-10-CM | POA: Diagnosis not present

## 2022-10-23 DIAGNOSIS — G35 Multiple sclerosis: Secondary | ICD-10-CM | POA: Diagnosis not present

## 2022-10-23 DIAGNOSIS — O99353 Diseases of the nervous system complicating pregnancy, third trimester: Secondary | ICD-10-CM

## 2022-10-23 DIAGNOSIS — Z9884 Bariatric surgery status: Secondary | ICD-10-CM

## 2022-10-26 ENCOUNTER — Other Ambulatory Visit: Payer: Self-pay | Admitting: Cardiovascular Disease

## 2022-10-27 ENCOUNTER — Encounter: Payer: Self-pay | Admitting: Maternal & Fetal Medicine

## 2022-10-28 ENCOUNTER — Other Ambulatory Visit: Payer: Self-pay | Admitting: *Deleted

## 2022-10-28 DIAGNOSIS — O09523 Supervision of elderly multigravida, third trimester: Secondary | ICD-10-CM

## 2022-10-28 DIAGNOSIS — O99213 Obesity complicating pregnancy, third trimester: Secondary | ICD-10-CM

## 2022-10-28 DIAGNOSIS — G35 Multiple sclerosis: Secondary | ICD-10-CM

## 2022-11-04 ENCOUNTER — Encounter (HOSPITAL_COMMUNITY): Payer: Self-pay | Admitting: Obstetrics

## 2022-11-04 ENCOUNTER — Inpatient Hospital Stay (HOSPITAL_BASED_OUTPATIENT_CLINIC_OR_DEPARTMENT_OTHER): Payer: No Typology Code available for payment source

## 2022-11-04 ENCOUNTER — Inpatient Hospital Stay (HOSPITAL_COMMUNITY)
Admission: AD | Admit: 2022-11-04 | Discharge: 2022-11-04 | Disposition: A | Discharge status: Home / Self Care | Payer: No Typology Code available for payment source | Attending: Obstetrics | Admitting: Obstetrics

## 2022-11-04 DIAGNOSIS — Z3A31 31 weeks gestation of pregnancy: Secondary | ICD-10-CM | POA: Insufficient documentation

## 2022-11-04 DIAGNOSIS — O9A213 Injury, poisoning and certain other consequences of external causes complicating pregnancy, third trimester: Secondary | ICD-10-CM

## 2022-11-04 DIAGNOSIS — O09523 Supervision of elderly multigravida, third trimester: Secondary | ICD-10-CM | POA: Diagnosis not present

## 2022-11-04 DIAGNOSIS — O368130 Decreased fetal movements, third trimester, not applicable or unspecified: Secondary | ICD-10-CM

## 2022-11-04 DIAGNOSIS — W010XXA Fall on same level from slipping, tripping and stumbling without subsequent striking against object, initial encounter: Secondary | ICD-10-CM | POA: Insufficient documentation

## 2022-11-04 DIAGNOSIS — Z3689 Encounter for other specified antenatal screening: Secondary | ICD-10-CM

## 2022-11-04 DIAGNOSIS — Z6720 Type B blood, Rh positive: Secondary | ICD-10-CM | POA: Diagnosis not present

## 2022-11-04 DIAGNOSIS — W19XXXA Unspecified fall, initial encounter: Secondary | ICD-10-CM | POA: Diagnosis not present

## 2022-11-04 DIAGNOSIS — W19XXXD Unspecified fall, subsequent encounter: Secondary | ICD-10-CM | POA: Diagnosis not present

## 2022-11-04 DIAGNOSIS — O26893 Other specified pregnancy related conditions, third trimester: Secondary | ICD-10-CM | POA: Insufficient documentation

## 2022-11-04 DIAGNOSIS — Y9301 Activity, walking, marching and hiking: Secondary | ICD-10-CM | POA: Diagnosis not present

## 2022-11-04 LAB — URINALYSIS, ROUTINE W REFLEX MICROSCOPIC
Bilirubin Urine: NEGATIVE
Glucose, UA: NEGATIVE mg/dL
Hgb urine dipstick: NEGATIVE
Ketones, ur: NEGATIVE mg/dL
Leukocytes,Ua: NEGATIVE
Nitrite: NEGATIVE
Protein, ur: NEGATIVE mg/dL
Specific Gravity, Urine: 1.003 — ABNORMAL LOW (ref 1.005–1.030)
pH: 6 (ref 5.0–8.0)

## 2022-11-04 MED ORDER — CYCLOBENZAPRINE HCL 5 MG PO TABS
10.0000 mg | ORAL_TABLET | Freq: Once | ORAL | Status: AC
  Administered 2022-11-04: 10 mg via ORAL
  Filled 2022-11-04: qty 2

## 2022-11-04 MED ORDER — CYCLOBENZAPRINE HCL 10 MG PO TABS: 10.0000 mg | ORAL_TABLET | Freq: Two times a day (BID) | ORAL | 0 refills | Status: DC | PRN

## 2022-11-04 MED ORDER — ACETAMINOPHEN 500 MG PO TABS
1000.0000 mg | ORAL_TABLET | Freq: Once | ORAL | Status: AC
  Administered 2022-11-04: 1000 mg via ORAL
  Filled 2022-11-04: qty 2

## 2022-11-04 NOTE — MAU Note (Signed)
Lynn Roberts is a 36 y.o. at 72w3dhere in MAU reporting: was at work, some one had but a tote behind her, she turned around and fell, landing on knees, abd and hands. Abd feels tight, no pain, pain in her knees. No bleeding or leaking.   Has not felt movement since fall  Onset of complaint: 1030 Pain score: knees hurt 6 Vitals:   11/04/22 1313  BP: 123/67  Pulse: 76  Resp: 18  Temp: 97.8 F (36.6 C)  SpO2: 100%     FHT:130 Lab orders placed from triage:  urine

## 2022-11-04 NOTE — MAU Provider Note (Signed)
History     CSN: [id number]  Arrival date and time: 11/04/22 1245   Event Date/Time   First Provider Initiated Contact with Patient 11/04/22 1406      Chief Complaint  Patient presents with   Lynn Roberts is a 36 y.o. [id number] at 77w3dwho presents today after a fall. She states that around 10:30 this morning while at work she tripped over a tote and fell onto her hands and knees and abdomen. She denies any, contractions, vaginal bleeding or LOF. She does feel pressure in her abdomen. She rates this 6/10. She has not felt much movement since the fall. She thinks she has felt the baby move once since being here. She states that she doesn't feel a lot of movement normally.   Fall The accident occurred 3 to 6 hours ago. The fall occurred while walking. She fell from a height of 3 to 5 ft. She landed on Hard floor. There was no blood loss. The point of impact was the left knee, right knee, left wrist and right wrist (abdomen). The pain is present in the right knee and left knee. The pain is at a severity of 5/10. She has tried acetaminophen (took around 11:30) for the symptoms. The treatment provided mild relief.    OB History     Gravida  4   Para  2   Term  1   Preterm  1   AB  1   Living  2      SAB  1   IAB      Ectopic      Multiple  0   Live Births  2           Past Medical History:  Diagnosis Date   Arthritis of low back    Complication of anesthesia    is of IPanamadescent   Family history of adverse reaction to anesthesia    pt is a Lumbi iPanama  GERD (gastroesophageal reflux disease)    none since pregnancy    Heart murmur    When pt. was pregnant with her last child, and also had murmur as a child.     Hemorrhoids    History of anal fissures    History of PAT (paroxysmal atrial tachycardia)    had cardiac ablation as a teenager   History of sleep apnea    none since 450 pound weight loss in 2015   Migraines    MS (multiple  sclerosis) (HCC)    PCOD (polycystic ovarian disease)    no menses    Past Surgical History:  Procedure Laterality Date   BREAST LUMPECTOMY Left 2011   papilloma   BREAST LUMPECTOMY Left 2015   papilloma   BREAST LUMPECTOMY WITH RADIOACTIVE SEED LOCALIZATION Left 10/18/2014   Procedure:  RADIOACTIVE SEED GUIDED EXCISIONAL BREAST BIOPSY;  Surgeon: DAlphonsa Overall MD;  Location: [facility]  Service: General;  Laterality: Left;   BREAST SURGERY Right 07/27/2004   retro-areolar dissection with exc. of large duct   CARDIAC ELECTROPHYSIOLOGY MAPPING AND ABLATION  sge 13   CARDIAC ELECTROPHYSIOLOGY STUDY AND ABLATION  age 36  CESAREAN SECTION N/A 03/28/2020   Procedure: C[place]  Surgeon: SDeliah Boston MD;  Location: [facility];  Service: Obstetrics;  Laterality: N/A;  RNFA [place]   CESAREAN SECTION  03/2018   CHOLECYSTECTOMY  04/27/2008   GASTROPLASTY DUODENAL SWITCH  01/2014   [facility]  INCISION AND DRAINAGE ABSCESS Bilateral 10/11/2022   Procedure: Incision and drainage of Bilateral Perirectoal abscess, Possible Seton;  Surgeon: Rolm Bookbinder, MD;  Location: [facility];  Service: General;  Laterality: Bilateral;   INCISION AND DRAINAGE PERIRECTAL ABSCESS N/A 08/15/2020   Procedure: ANORECTAL EXAM UNDER ANESTHESIA INCISION AND DRAINAGE OF PERIRECTAL ABSCESS x2 WITH PLACEMENT OF SETON;  Surgeon: Ileana Roup, MD;  Location: [facility];  Service: General;  Laterality: N/A;   OVARIAN CYST REMOVAL Bilateral 1999   PLACEMENT OF SETON  08/15/2020   PLACEMENT OF SETON N/A 12/08/2020   Procedure: PLACEMENT OF CUTTING SETON; PARTIAL FISTULOTOMY;  Surgeon: Ileana Roup, MD;  Location: [facility];  Service: General;  Laterality: N/A;   RECTAL EXAM UNDER ANESTHESIA N/A 12/08/2020   Procedure: ANORECTAL EXAM UNDER ANESTHESIA;  Surgeon: Ileana Roup, MD;  Location: [facility];  Service: General;  Laterality: N/A;   TOENAIL  EXCISION  yrs ago   UNILATERAL SALPINGECTOMY      Family History  Problem Relation Age of Onset   Hypertension Mother    Diabetes Mother    Hypertension Father    Hyperlipidemia Father    Cancer Maternal Grandmother        multiple myaloma, breast cancer   Heart disease Maternal Grandmother    Diabetes Maternal Grandfather    Cancer Paternal Grandfather        pancreactic   Colon cancer Neg Hx    Stomach cancer Neg Hx    Rectal cancer Neg Hx    Esophageal cancer Neg Hx     Social History   Tobacco Use   Smoking status: Every Day    Packs/day: 0.25    Years: 10.00    Total pack years: 2.50    Types: Cigarettes   Smokeless tobacco: Never   Tobacco comments:    4-5 cigs per day  Vaping Use   Vaping Use: Never used  Substance Use Topics   Alcohol use: No   Drug use: No    Allergies:  Allergies  Allergen Reactions   Nsaids Other (See Comments)    HX. OF WEIGHT-LOSS SURGERY    Other Rash    Reaction to LOTIONS    Sulfa Antibiotics Rash   Tramadol Rash    Had after 2014 and tolerated okay    Medications Prior to Admission  Medication Sig Dispense Refill Last Dose   aspirin EC 81 MG tablet Take 81 mg by mouth daily. Swallow whole.   11/04/2022   cholecalciferol (VITAMIN D3) 25 MCG (1000 UNIT) tablet Take 1,000 Units by mouth daily.   11/04/2022   Copper Gluconate (COPPER CAPS) 2 MG CAPS Take 1 capsule by mouth daily.   11/04/2022   cyanocobalamin (VITAMIN B12) 1000 MCG/ML injection INJECT 1 ML INTO THE SKIN EVERY 4 WEEKS 3 mL 1 11/03/2022   famotidine (PEPCID) 20 MG tablet Take 1 tablet (20 mg total) by mouth 2 (two) times daily as needed for heartburn. 60 tablet 0 11/03/2022   ferrous sulfate 325 (65 FE) MG tablet Take 325 mg by mouth daily with breakfast.   11/04/2022   Glatiramer Acetate (COPAXONE [place]) Inject 40 mg into the skin. Three times a week   11/03/2022   Multiple Vitamin (MULTIVITAMIN WITH MINERALS) TABS tablet Take 1 tablet by mouth daily. 30 tablet 0  11/04/2022   nicotine (NICODERM CQ) 7 mg/24hr patch Place 1 patch (7 mg total) onto the skin daily. 28 patch 1 11/03/2022  nicotine polacrilex (NICORETTE) 2 MG gum TAKE 1 EACH (2 MG) BY MOUTH AS NEEDED FOR SMOKING CESSATION 100 each 1 Past Month   oxyCODONE (OXY IR/ROXICODONE) 5 MG immediate release tablet Take 1-2 tablets (5-10 mg total) by mouth every 6 (six) hours as needed for moderate pain. 30 tablet 0 Past Month   Prenat-FeCbn-FeAsp-Meth-FA-DHA (PRENATE MINI) 18-0.6-0.4-350 MG CAPS Take 1 capsule by mouth daily.   11/04/2022   sertraline (ZOLOFT) 50 MG tablet Take 1 tablet (50 mg total) by mouth daily. 90 tablet 3 11/04/2022   amoxicillin-clavulanate (AUGMENTIN) 875-125 MG tablet Take 1 tablet by mouth 2 (two) times daily. (Patient not taking: Reported on 10/23/2022) 14 tablet 0 Unknown   Glatiramer Acetate 40 MG/ML SOSY Inject into the skin.   Unknown    Review of Systems  All other systems reviewed and are negative.  Physical Exam   Blood pressure 129/71, pulse 78, temperature 98.1 F (36.7 C), resp. rate 18, height '5\' 11"'$  (1.803 m), weight 128.5 kg, last menstrual period 03/29/2022, SpO2 100 %.  Physical Exam Constitutional:      Appearance: She is well-developed.  HENT:     Head: Normocephalic.  Eyes:     Pupils: Pupils are equal, round, and reactive to light.  Cardiovascular:     Rate and Rhythm: Normal rate and regular rhythm.     Heart sounds: Normal heart sounds.  Pulmonary:     Effort: Pulmonary effort is normal. No respiratory distress.     Breath sounds: Normal breath sounds.  Abdominal:     Palpations: Abdomen is soft.     Tenderness: There is no abdominal tenderness.  Genitourinary:    Vagina: No bleeding. Vaginal discharge: mucusy.    Comments: External: no lesion Vagina: small amount of white discharge     Musculoskeletal:        General: Normal range of motion.     Cervical back: Normal range of motion and neck supple.       Legs:     Comments: Small  abrasions to both knees. Larger abrasion just above the left ankle.   Skin:    General: Skin is warm and dry.  Neurological:     Mental Status: She is alert and oriented to person, place, and time.  Psychiatric:        Mood and Affect: Mood normal.        Behavior: Behavior normal.    NST:  Baseline: 120 Variability: moderate Accels: 15x15 Decels: none Toco: none Reactive/Appropriate for GA  BPP: Comments  This patient presented to the MAU after she fell at work.  A biophysical profile performed today was 8/8.  The AFI was 14.9 cm (within normal limits).  A normal-appearing posterior placenta is noted. ----------------------------------------------------------------------                   Johnell Comings, MD Electronically Signed Final Report   11/04/2022 04:06 pm  Results for orders placed or performed during the hospital encounter of 11/04/22 (from the past 24 hour(s))  Urinalysis, Routine w reflex microscopic Urine, Clean Catch     Status: Abnormal   Collection Time: 11/04/22  1:56 PM  Result Value Ref Range   Color, Urine STRAW (A) YELLOW   APPearance CLEAR CLEAR   Specific Gravity, Urine 1.003 (L) 1.005 - 1.030   pH 6.0 5.0 - 8.0   Glucose, UA NEGATIVE NEGATIVE mg/dL   Hgb urine dipstick NEGATIVE NEGATIVE   Bilirubin Urine NEGATIVE NEGATIVE   Ketones,  ur NEGATIVE NEGATIVE mg/dL   Protein, ur NEGATIVE NEGATIVE mg/dL   Nitrite NEGATIVE NEGATIVE   Leukocytes,Ua NEGATIVE NEGATIVE    MAU Course  Procedures  MDM 5:38pm: Patient has had 4 hours of monitoring. FHR has remained reactive and no contractions tracing.    Assessment and Plan   1. Fall, initial encounter   2. [redacted] weeks gestation of pregnancy   3. NST (non-stress test) reactive   4. Type B blood, Rh positive    DC home in stable condition  3rd Trimester precautions  PTL precautions  Fetal kick counts RX: Flexeril '10mg'$  PRN #20  Return to MAU as needed FU with OB as planned   Follow-up Information      [facility] Follow up.   Contact information: [address]  [address] [place] [address] [phone]                Marcellina Jonsson DNP, CNM  11/04/22  5:43 PM

## 2022-11-12 DIAGNOSIS — O99213 Obesity complicating pregnancy, third trimester: Secondary | ICD-10-CM | POA: Diagnosis not present

## 2022-11-12 DIAGNOSIS — Z3A32 32 weeks gestation of pregnancy: Secondary | ICD-10-CM | POA: Diagnosis not present

## 2022-11-12 DIAGNOSIS — O09519 Supervision of elderly primigravida, unspecified trimester: Secondary | ICD-10-CM | POA: Diagnosis not present

## 2022-11-14 ENCOUNTER — Encounter: Payer: Self-pay | Admitting: Hematology and Oncology

## 2022-11-15 ENCOUNTER — Encounter: Payer: Self-pay | Admitting: Hematology and Oncology

## 2022-11-15 ENCOUNTER — Other Ambulatory Visit: Payer: Self-pay | Admitting: Neurology

## 2022-11-20 ENCOUNTER — Ambulatory Visit: Payer: 59

## 2022-11-20 DIAGNOSIS — O365990 Maternal care for other known or suspected poor fetal growth, unspecified trimester, not applicable or unspecified: Secondary | ICD-10-CM | POA: Diagnosis not present

## 2022-11-20 DIAGNOSIS — O09293 Supervision of pregnancy with other poor reproductive or obstetric history, third trimester: Secondary | ICD-10-CM | POA: Diagnosis not present

## 2022-11-20 DIAGNOSIS — Z3A33 33 weeks gestation of pregnancy: Secondary | ICD-10-CM | POA: Diagnosis not present

## 2022-11-20 DIAGNOSIS — O09519 Supervision of elderly primigravida, unspecified trimester: Secondary | ICD-10-CM | POA: Diagnosis not present

## 2022-11-27 ENCOUNTER — Encounter: Payer: Self-pay | Admitting: Neurology

## 2022-11-27 ENCOUNTER — Ambulatory Visit: Payer: 59 | Attending: Maternal & Fetal Medicine

## 2022-11-27 ENCOUNTER — Ambulatory Visit (INDEPENDENT_AMBULATORY_CARE_PROVIDER_SITE_OTHER): Payer: 59 | Admitting: Neurology

## 2022-11-27 ENCOUNTER — Ambulatory Visit: Payer: 59 | Admitting: *Deleted

## 2022-11-27 ENCOUNTER — Telehealth: Payer: Self-pay | Admitting: *Deleted

## 2022-11-27 VITALS — BP 159/94 | HR 83 | Ht 71.0 in | Wt 289.5 lb

## 2022-11-27 VITALS — BP 117/73 | HR 81

## 2022-11-27 DIAGNOSIS — O09523 Supervision of elderly multigravida, third trimester: Secondary | ICD-10-CM | POA: Insufficient documentation

## 2022-11-27 DIAGNOSIS — O99843 Bariatric surgery status complicating pregnancy, third trimester: Secondary | ICD-10-CM | POA: Diagnosis not present

## 2022-11-27 DIAGNOSIS — Z3A36 36 weeks gestation of pregnancy: Secondary | ICD-10-CM

## 2022-11-27 DIAGNOSIS — Z9884 Bariatric surgery status: Secondary | ICD-10-CM | POA: Insufficient documentation

## 2022-11-27 DIAGNOSIS — O09213 Supervision of pregnancy with history of pre-term labor, third trimester: Secondary | ICD-10-CM | POA: Diagnosis not present

## 2022-11-27 DIAGNOSIS — O99353 Diseases of the nervous system complicating pregnancy, third trimester: Secondary | ICD-10-CM | POA: Diagnosis not present

## 2022-11-27 DIAGNOSIS — G35 Multiple sclerosis: Secondary | ICD-10-CM | POA: Diagnosis not present

## 2022-11-27 DIAGNOSIS — O34219 Maternal care for unspecified type scar from previous cesarean delivery: Secondary | ICD-10-CM | POA: Diagnosis not present

## 2022-11-27 DIAGNOSIS — Z3A34 34 weeks gestation of pregnancy: Secondary | ICD-10-CM

## 2022-11-27 DIAGNOSIS — E669 Obesity, unspecified: Secondary | ICD-10-CM

## 2022-11-27 DIAGNOSIS — O99213 Obesity complicating pregnancy, third trimester: Secondary | ICD-10-CM | POA: Insufficient documentation

## 2022-11-27 DIAGNOSIS — Z79899 Other long term (current) drug therapy: Secondary | ICD-10-CM | POA: Diagnosis not present

## 2022-11-27 DIAGNOSIS — O403XX0 Polyhydramnios, third trimester, not applicable or unspecified: Secondary | ICD-10-CM

## 2022-11-27 NOTE — Telephone Encounter (Signed)
Faxed completed/signed Zeposia start form to [employer] at [phone]. Received fax confirmation.  Per Dr Felecia Shelling: "she can start after delivery (scheduled c-section 12/25/22)"

## 2022-11-27 NOTE — Progress Notes (Signed)
[facility]  PATIENT: Lynn Roberts DOB: 09/03/1986  REFERRING DOCTOR OR PCP:  Dr. Tama High (Ophthalmology); Amie Portland, MD (Neuro-hospitalist); Dionisio David (PCP) SOURCE:   _________________________________   HISTORICAL  CHIEF COMPLAINT:  Chief Complaint  Patient presents with   Room 11    Pt is here Alone. Pt states that everything has been going good. Pt states no muscle weakness.     HISTORY OF PRESENT ILLNESS:  Lynn Roberts, is a 36 y.o. woman with relapsing remitting multiple sclerosis.   UPDATE 11/27/2022: She is due 12/27/2022 (baby girl) and will be doing C-section.   She had an U/S this morning and everything looks good.   Shw as on Zeposia the first 8 weeks of pregnancy (and due to halflife there could have been exposure another 4 weeks).    This is her 4th pregnancy (1st one was miscarriage at 6 weeks, has 2 kids.  This one unplanned).   She switched to Copaxone but would like to switch back to [place] after delivery.    Her Vit A was low at 7.1 (normal is 18.9).  She is taking prenatal vitamins.   Supplements were ordered by her surgeon but ObGyn wanted her to hold off until after delivery.    She feels her gait is stable and she has no difficulty on even surfaces.  She fell twice - tripped at work over a foot  and a second time a week later outdoors.  Balance is slightly off but not poor.   She does not need cane.  .   She uses the bannister on stairs.   She feels strength is doing well.   No muscle spasms.   She denies tingling or numbness.     She presented with left optic neuritis.  Vision improved but not completely to baseline and she continues to have mild visual blurring and reduced color saturation OS..   She still sees  firefly like floaters off/on.     She has urinary frequency and rare urge incontinence.  Oxybutynin has helped but was stopped for pregnancy.   .  Headaches are doing ok   She sometmes has leg pain and back pain  since yesterday as well.  She has more fatigue.  She is sleeping well most nights.  Mood is ok..    She has occasional nervousness but no severe anxiety.    Cognition is doing well.    She is seeing [facility] system).     MS History: She presented to [facility] 02/22/2021 after she had the onset of left eye pain, headache and complete loss of vision OS 02/21/2021.  She saw Dr. Brigitte Pulse (ophthalmology) who advised her to go to the ED.    She had studies and was admitted and received 5 days of IV steroids.  MRI of the orbits confirmed left optic neuritis.  MRI of the brain showed multiple foci consistent with MS.  2 foci in the left hemisphere enhanced after contrast.  MRI of the spine did not show any additional lesions though there was extensive movement artifact that limited the study.   She had no other neurologic episode in the past.   She began to improve a day after the IV Solu-Medrol was initiated and was much better by discharge.      She started Zeposia 04/2021.    She became pregnant and stopped Zeposia 05/2021 (was 7-[redacted] weeks pregnant at time)   She started Copaxone 06/2022.  Other Medical:  She was > 650 pounds and had bariatric surgery (duodenal switch), losing > 400 pounds in 2015.   She had an anal fissure last year requiring surgery and still has some mild bleeding.   She has seen surgery and they have requested a colonoscopy.     She smokes and is trying to quit (using patches).   She has two kids ages 46 and 12.       Imaging: MRI of the brain 02/22/2021 shows multiple T2/FLAIR hyperintense foci in the hemispheres in a pattern consistent with multiple sclerosis.  2 foci on the left show subtle enhancement after contrast.  MRI of the orbits 02/22/2021 shows enhancement of the left optic nerve.  MRI of the cervical spine 02/23/2021 limited by movement artifact shows a normal spinal cord and mild multilevel degenerative changes.  MRI of the thoracic spine 02/23/2021 limited by  movement artifact shows a normal spinal cord and degenerative changes with mild spinal stenosis at T11-T12 and T12-L1.   Laboratory tests March 2022:   Anti-NMO and anti-MOG are negative.  B12 low at 152 (180-914).   EKG normal 2/.2021  REVIEW OF SYSTEMS: Constitutional: No fevers, chills, sweats, or change in appetite Eyes: No visual changes, double vision, eye pain Ear, nose and throat: No hearing loss, ear pain, nasal congestion, sore throat Cardiovascular: No chest pain, palpitations Respiratory:  No shortness of breath at rest or with exertion.   No wheezes GastrointestinaI: No nausea, vomiting, diarrhea, abdominal pain, fecal incontinence Genitourinary:  No dysuria, urinary retention or frequency.  No nocturia. Musculoskeletal:  No neck pain, back pain Integumentary: No rash, pruritus, skin lesions Neurological: as above Psychiatric: No depression at this time.  No anxiety Endocrine: No palpitations, diaphoresis, change in appetite, change in weigh or increased thirst Hematologic/Lymphatic:  No anemia, purpura, petechiae. Allergic/Immunologic: No itchy/runny eyes, nasal congestion, recent allergic reactions, rashes  ALLERGIES: Allergies  Allergen Reactions   Nsaids Other (See Comments)    HX. OF WEIGHT-LOSS SURGERY    Other Rash    Reaction to LOTIONS    Sulfa Antibiotics Rash   Tramadol Rash    Had after 2014 and tolerated okay    HOME MEDICATIONS:  Current Outpatient Medications:    aspirin EC 81 MG tablet, Take 81 mg by mouth daily. Swallow whole., Disp: , Rfl:    cholecalciferol (VITAMIN D3) 25 MCG (1000 UNIT) tablet, Take 1,000 Units by mouth daily., Disp: , Rfl:    Copper Gluconate (COPPER CAPS) 2 MG CAPS, Take 1 capsule by mouth daily., Disp: , Rfl:    cyanocobalamin (VITAMIN B12) 1000 MCG/ML injection, INJECT 1 ML INTO THE SKIN EVERY 4 WEEKS, Disp: 3 mL, Rfl: 1   famotidine (PEPCID) 20 MG tablet, Take 1 tablet (20 mg total) by mouth 2 (two) times daily as  needed for heartburn., Disp: 60 tablet, Rfl: 0   ferrous sulfate 325 (65 FE) MG tablet, Take 325 mg by mouth daily with breakfast., Disp: , Rfl:    Glatiramer Acetate (COPAXONE ), Inject 40 mg into the skin. Three times a week, Disp: , Rfl:    Glatiramer Acetate 40 MG/ML SOSY, Inject into the skin., Disp: , Rfl:    Multiple Vitamin (MULTIVITAMIN WITH MINERALS) TABS tablet, Take 1 tablet by mouth daily., Disp: 30 tablet, Rfl: 0   nicotine (NICODERM CQ) 7 mg/24hr patch, Place 1 patch (7 mg total) onto the skin daily., Disp: 28 patch, Rfl: 1   nicotine polacrilex (NICORETTE) 2 MG gum, TAKE  1 EACH (2 MG) BY MOUTH AS NEEDED FOR SMOKING CESSATION, Disp: 100 each, Rfl: 1   Prenat-FeCbn-FeAsp-Meth-FA-DHA (PRENATE MINI) 18-0.6-0.4-350 MG CAPS, Take 1 capsule by mouth daily., Disp: , Rfl:    sertraline (ZOLOFT) 50 MG tablet, TAKE 1 TABLET BY MOUTH EVERY DAY, Disp: 90 tablet, Rfl: 3   amoxicillin-clavulanate (AUGMENTIN) 875-125 MG tablet, Take 1 tablet by mouth 2 (two) times daily. (Patient not taking: Reported on 10/23/2022), Disp: 14 tablet, Rfl: 0   cyclobenzaprine (FLEXERIL) 10 MG tablet, Take 1 tablet (10 mg total) by mouth 2 (two) times daily as needed for muscle spasms. (Patient not taking: Reported on 11/27/2022), Disp: 20 tablet, Rfl: 0   oxyCODONE (OXY IR/ROXICODONE) 5 MG immediate release tablet, Take 1-2 tablets (5-10 mg total) by mouth every 6 (six) hours as needed for moderate pain., Disp: 30 tablet, Rfl: 0  PAST MEDICAL HISTORY: Past Medical History:  Diagnosis Date   Arthritis of low back    Complication of anesthesia    is of [name] descent   Family history of adverse reaction to anesthesia    pt is a Lumbi [name]   GERD (gastroesophageal reflux disease)    none since pregnancy    Heart murmur    When pt. was pregnant with her last child, and also had murmur as a child.     Hemorrhoids    History of anal fissures    History of PAT (paroxysmal atrial tachycardia)    had cardiac  ablation as a teenager   History of sleep apnea    none since 450 pound weight loss in 2015   Migraines    MS (multiple sclerosis) (HCC)    PCOD (polycystic ovarian disease)    no menses    PAST SURGICAL HISTORY: Past Surgical History:  Procedure Laterality Date   BREAST LUMPECTOMY Left 2011   papilloma   BREAST LUMPECTOMY Left 2015   papilloma   BREAST LUMPECTOMY WITH RADIOACTIVE SEED LOCALIZATION Left 10/18/2014   Procedure:  RADIOACTIVE SEED GUIDED EXCISIONAL BREAST BIOPSY;  Surgeon: Alphonsa Overall, MD;  Location: [place];  Service: General;  Laterality: Left;   BREAST SURGERY Right 07/27/2004   retro-areolar dissection with exc. of large duct   CARDIAC ELECTROPHYSIOLOGY MAPPING AND ABLATION  sge 13   CARDIAC ELECTROPHYSIOLOGY STUDY AND ABLATION  age 80   CESAREAN SECTION N/A 03/28/2020   Procedure: [place];  Surgeon: Deliah Boston, MD;  Location: [facility];  Service: Obstetrics;  Laterality: N/A;  RNFA [place]   CESAREAN SECTION  03/2018   CHOLECYSTECTOMY  04/27/2008   GASTROPLASTY DUODENAL SWITCH  01/2014   done at [facility] Bilateral 10/11/2022   Procedure: Incision and drainage of Bilateral Perirectoal abscess, Possible Seton;  Surgeon: Rolm Bookbinder, MD;  Location: [place];  Service: General;  Laterality: Bilateral;   INCISION AND DRAINAGE PERIRECTAL ABSCESS N/A 08/15/2020   Procedure: ANORECTAL EXAM UNDER ANESTHESIA INCISION AND DRAINAGE OF PERIRECTAL ABSCESS x2 WITH PLACEMENT OF SETON;  Surgeon: Ileana Roup, MD;  Location: [facility];  Service: General;  Laterality: N/A;   OVARIAN CYST REMOVAL Bilateral 1999   PLACEMENT OF SETON  08/15/2020   PLACEMENT OF SETON N/A 12/08/2020   Procedure: PLACEMENT OF CUTTING SETON; PARTIAL FISTULOTOMY;  Surgeon: Ileana Roup, MD;  Location: [place];  Service: General;  Laterality: N/A;   RECTAL EXAM UNDER ANESTHESIA N/A 12/08/2020   Procedure: ANORECTAL  EXAM UNDER ANESTHESIA;  Surgeon: Dema Severin,  Sharon Mt, MD;  Location: [facility];  Service: General;  Laterality: N/A;   TOENAIL EXCISION  yrs ago   UNILATERAL SALPINGECTOMY      FAMILY HISTORY: Family History  Problem Relation Age of Onset   Hypertension Mother    Diabetes Mother    Hypertension Father    Hyperlipidemia Father    Cancer Maternal Grandmother        multiple myaloma, breast cancer   Heart disease Maternal Grandmother    Diabetes Maternal Grandfather    Cancer Paternal Grandfather        pancreactic   Colon cancer Neg Hx    Stomach cancer Neg Hx    Rectal cancer Neg Hx    Esophageal cancer Neg Hx     SOCIAL HISTORY:  Social History   Socioeconomic History   Marital status: Married    Spouse name: Quillian Quince   Number of children: 2   Years of education: 12   Highest education level: Not on file  Occupational History   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   Caffeine use: 1 cup per day of tea   Social Determinants of Health   Financial Resource Strain: Not on file  Food Insecurity: Not on file  Transportation Needs: No Transportation Needs (10/11/2022)   PRAPARE - Hydrologist (Medical): No    Lack of Transportation (Non-Medical): No  Physical Activity: Not on file  Stress: Not on file  Social Connections: Not on file  Intimate Partner Violence: Not At Risk (10/11/2022)   Humiliation, Afraid, Rape, and Kick  questionnaire    Fear of Current or Ex-Partner: No    Emotionally Abused: No    Physically Abused: No    Sexually Abused: No     PHYSICAL EXAM  Vitals:   11/27/22 1437  BP: (!) 159/94  Pulse: 83  Weight: 289 lb 8 oz (131.3 kg)  Height: '5\' 11"'$  (1.803 m)    Body mass index is 40.38 kg/m.   No results found.   General: The patient is well-developed and well-nourished and in no acute distress  HEENT:  Head is [place]/AT.  Sclera are anicteric.    Skin: Extremities are without rash or edema.  Neurologic Exam  Mental status: The patient is alert and oriented x 3 at the time of the examination. The patient has apparent normal recent and remote memory, with an apparently normal attention span and concentration ability.   Speech is normal.  Cranial nerves: Extraocular movements are full.  She has reduced color vision OS.  Visual acuity is fairly symmetrical.  There is good facial sensation to soft touch bilaterally.Facial strength is normal.  No obvious hearing deficits are noted.  Motor:  Muscle bulk is normal.   Tone is normal. Strength is  5 / 5 in  all 4 extremities.   Sensory: Sensory testing is intact to pinprick, soft touch and vibration sensation in all 4 extremities.  Coordination: Cerebellar testing reveals good finger-nose-finger and heel-to-shin bilaterally.  Gait and station: Station is normal.   Gait is normal.  Tandem gait is pretty normal.  Romberg is negative.   Reflexes: Deep tendon reflexes are symmetric and normal bilaterally.      DIAGNOSTIC DATA (LABS, IMAGING, TESTING) - I reviewed patient records, labs, notes, testing and imaging myself where available.  Lab Results  Component Value Date   WBC 11.7 (H) 10/12/2022   HGB 10.5 (L) 10/12/2022   HCT 32.0 (L) 10/12/2022   MCV 86.7 10/12/2022   PLT 183 10/12/2022      Component Value Date/Time   NA 136 10/10/2022 1618   NA 143 06/20/2021 1539   K 3.7 10/10/2022 1618   CL 107 10/10/2022 1618   CO2 23  10/10/2022 1618   GLUCOSE 100 (H) 10/10/2022 1618   BUN 8 10/10/2022 1618   BUN 9 06/20/2021 1539   CREATININE 0.57 10/10/2022 1618   CREATININE 0.62 03/03/2020 1452   CALCIUM 8.2 (L) 10/10/2022 1618   PROT 6.1 12/26/2021 1634   ALBUMIN 3.6 (L) 12/26/2021 1634   AST 14 12/26/2021 1634   AST 10 (L) 03/03/2020 1452   ALT 11 12/26/2021 1634   ALT 10 03/03/2020 1452   ALKPHOS 55 12/26/2021 1634   BILITOT 0.3 12/26/2021 1634   BILITOT 0.4 03/03/2020 1452   GFRNONAA >60 10/10/2022 1618   GFRNONAA >60 03/03/2020 1452   GFRAA >60 08/15/2020 0354   GFRAA >60 03/03/2020 1452   No results found for: "CHOL", "HDL", "LDLCALC", "LDLDIRECT", "TRIG", "CHOLHDL" Lab Results  Component Value Date   HGBA1C 4.6 (L) 11/05/2017   Lab Results  Component Value Date   VITAMINB12 617 10/11/2022   Lab Results  Component Value Date   TSH 1.380 10/12/2019       ASSESSMENT AND PLAN  Multiple sclerosis ([place]) - Plan: MR BRAIN W WO CONTRAST  High risk medication use  S/P bariatric surgery  [redacted] weeks gestation of pregnancy   Continue glatiramer/Copaxone  or now and re-start Zeposia shortly after delivery (not planning on breastfeeding).     Should be delivering around 12/25/22.   I had her sign a new service request form.  Check MRI +/- March Stay active and exercise as tolerated.   Continue Vit D, Vit B12 (shots) and copper supplements.   Rtc 6 months or call sooner if new or worsening issues.     Artyom Stencel A. Felecia Shelling, MD, [facility] 29/51/8841, 6:60 PM Certified in Neurology, Clinical Neurophysiology, Sleep Medicine and Neuroimaging  [facility] [address] [place], [place] [address] [phone]

## 2022-11-28 DIAGNOSIS — O09519 Supervision of elderly primigravida, unspecified trimester: Secondary | ICD-10-CM | POA: Diagnosis not present

## 2022-11-28 DIAGNOSIS — Z3A34 34 weeks gestation of pregnancy: Secondary | ICD-10-CM | POA: Diagnosis not present

## 2022-11-28 DIAGNOSIS — O99213 Obesity complicating pregnancy, third trimester: Secondary | ICD-10-CM | POA: Diagnosis not present

## 2022-12-04 ENCOUNTER — Ambulatory Visit: Payer: 59

## 2022-12-04 DIAGNOSIS — O09523 Supervision of elderly multigravida, third trimester: Secondary | ICD-10-CM | POA: Diagnosis not present

## 2022-12-04 DIAGNOSIS — O365990 Maternal care for other known or suspected poor fetal growth, unspecified trimester, not applicable or unspecified: Secondary | ICD-10-CM | POA: Diagnosis not present

## 2022-12-04 DIAGNOSIS — O34211 Maternal care for low transverse scar from previous cesarean delivery: Secondary | ICD-10-CM | POA: Diagnosis not present

## 2022-12-04 DIAGNOSIS — O09519 Supervision of elderly primigravida, unspecified trimester: Secondary | ICD-10-CM | POA: Diagnosis not present

## 2022-12-04 DIAGNOSIS — K611 Rectal abscess: Secondary | ICD-10-CM | POA: Diagnosis not present

## 2022-12-04 DIAGNOSIS — Z3A31 31 weeks gestation of pregnancy: Secondary | ICD-10-CM | POA: Diagnosis not present

## 2022-12-04 DIAGNOSIS — Z3A35 35 weeks gestation of pregnancy: Secondary | ICD-10-CM | POA: Diagnosis not present

## 2022-12-04 DIAGNOSIS — O99213 Obesity complicating pregnancy, third trimester: Secondary | ICD-10-CM | POA: Diagnosis not present

## 2022-12-11 NOTE — Telephone Encounter (Signed)
I returned call and spoke with Anderson Malta she reports two things are needed.  Pt consent originally received is not legible and e-mail address is needed for updated pt consent. I provided the e-mail on file to [facility] and she is going to send a e-link to the pt. The [employer] insurance is secondary for the pt and the primary insurance cards are needed. From what I can see, Blair Hailey is the primary insurance and jennifer confirmed she does not have a copy of this insurance. I have submitted today, [phone], confirmation received.

## 2022-12-11 NOTE — Telephone Encounter (Signed)
Lynn Roberts with [employer] is asking for a call re: info on Zeposia start form , her call back # is [phone]

## 2022-12-12 DIAGNOSIS — O365990 Maternal care for other known or suspected poor fetal growth, unspecified trimester, not applicable or unspecified: Secondary | ICD-10-CM | POA: Diagnosis not present

## 2022-12-12 DIAGNOSIS — O99213 Obesity complicating pregnancy, third trimester: Secondary | ICD-10-CM | POA: Diagnosis not present

## 2022-12-12 DIAGNOSIS — Z3685 Encounter for antenatal screening for Streptococcus B: Secondary | ICD-10-CM | POA: Diagnosis not present

## 2022-12-12 DIAGNOSIS — Z3A36 36 weeks gestation of pregnancy: Secondary | ICD-10-CM | POA: Diagnosis not present

## 2022-12-12 LAB — OB RESULTS CONSOLE GBS: GBS: POSITIVE

## 2022-12-13 DIAGNOSIS — K610 Anal abscess: Secondary | ICD-10-CM | POA: Diagnosis not present

## 2022-12-16 ENCOUNTER — Encounter (HOSPITAL_COMMUNITY): Payer: Self-pay

## 2022-12-16 NOTE — Patient Instructions (Addendum)
CHERE BABSON  12/16/2022   Your procedure is scheduled on:  12/27/2022  Arrive at [place] at Entrance C on [address] at [facility]  and [address]. You are invited to use the FREE valet parking or use the Visitor's parking deck.  Pick up the phone at the desk and dial [phone].  Call this number if you have problems the morning of surgery: [phone]  Remember:   Do not eat food:(After Midnight) Desps de medianoche.  Do not drink clear liquids: (After Midnight) Desps de medianoche.  Take these medicines the morning of surgery with A SIP OF WATER:  Take zoloft as prescribed. Take copaxone as prescribed and scheduled   Do not wear jewelry, make-up or nail polish.  Do not wear lotions, powders, or perfumes. Do not wear deodorant.  Do not shave 48 hours prior to surgery.  Do not bring valuables to the hospital.  [facility] is not   responsible for any belongings or valuables brought to the hospital.  Contacts, dentures or bridgework may not be worn into surgery.  Leave suitcase in the car. After surgery it may be brought to your room.  For patients admitted to the hospital, checkout time is 11:00 AM the day of              discharge.      Please read over the following fact sheets that you were given:     Preparing for Surgery

## 2022-12-18 ENCOUNTER — Ambulatory Visit: Payer: 59 | Admitting: Neurology

## 2022-12-18 DIAGNOSIS — O09523 Supervision of elderly multigravida, third trimester: Secondary | ICD-10-CM | POA: Diagnosis not present

## 2022-12-18 DIAGNOSIS — Z3A31 31 weeks gestation of pregnancy: Secondary | ICD-10-CM | POA: Diagnosis not present

## 2022-12-18 DIAGNOSIS — K611 Rectal abscess: Secondary | ICD-10-CM | POA: Diagnosis not present

## 2022-12-18 DIAGNOSIS — O99213 Obesity complicating pregnancy, third trimester: Secondary | ICD-10-CM | POA: Diagnosis not present

## 2022-12-18 DIAGNOSIS — O365990 Maternal care for other known or suspected poor fetal growth, unspecified trimester, not applicable or unspecified: Secondary | ICD-10-CM | POA: Diagnosis not present

## 2022-12-18 DIAGNOSIS — O34211 Maternal care for low transverse scar from previous cesarean delivery: Secondary | ICD-10-CM | POA: Diagnosis not present

## 2022-12-18 DIAGNOSIS — Z3A37 37 weeks gestation of pregnancy: Secondary | ICD-10-CM | POA: Diagnosis not present

## 2022-12-19 NOTE — Telephone Encounter (Signed)
Called Zeposia 360 support and spoke with [place]. Confirmed they were able to speak with pt on 12/13/22.   She plans to start Zeposia after delivering of baby. She has C-section scheduled for 12/27/22.

## 2022-12-25 ENCOUNTER — Encounter (HOSPITAL_COMMUNITY)
Admission: RE | Admit: 2022-12-25 | Discharge: 2022-12-25 | Disposition: A | Discharge status: Home / Self Care | Payer: 59 | Source: Ambulatory Visit | Attending: Obstetrics and Gynecology | Admitting: Obstetrics and Gynecology

## 2022-12-25 DIAGNOSIS — O165 Unspecified maternal hypertension, complicating the puerperium: Secondary | ICD-10-CM | POA: Diagnosis not present

## 2022-12-25 DIAGNOSIS — O34219 Maternal care for unspecified type scar from previous cesarean delivery: Secondary | ICD-10-CM | POA: Diagnosis not present

## 2022-12-25 DIAGNOSIS — Z01812 Encounter for preprocedural laboratory examination: Secondary | ICD-10-CM | POA: Insufficient documentation

## 2022-12-25 DIAGNOSIS — O99354 Diseases of the nervous system complicating childbirth: Secondary | ICD-10-CM | POA: Diagnosis not present

## 2022-12-25 DIAGNOSIS — Z3A38 38 weeks gestation of pregnancy: Secondary | ICD-10-CM | POA: Diagnosis not present

## 2022-12-25 DIAGNOSIS — Z302 Encounter for sterilization: Secondary | ICD-10-CM | POA: Diagnosis not present

## 2022-12-25 DIAGNOSIS — O99334 Smoking (tobacco) complicating childbirth: Secondary | ICD-10-CM | POA: Diagnosis not present

## 2022-12-25 DIAGNOSIS — R69 Illness, unspecified: Secondary | ICD-10-CM | POA: Diagnosis not present

## 2022-12-25 DIAGNOSIS — F17210 Nicotine dependence, cigarettes, uncomplicated: Secondary | ICD-10-CM | POA: Diagnosis present

## 2022-12-25 DIAGNOSIS — O365990 Maternal care for other known or suspected poor fetal growth, unspecified trimester, not applicable or unspecified: Secondary | ICD-10-CM | POA: Diagnosis not present

## 2022-12-25 DIAGNOSIS — G35 Multiple sclerosis: Secondary | ICD-10-CM | POA: Diagnosis not present

## 2022-12-25 DIAGNOSIS — O34211 Maternal care for low transverse scar from previous cesarean delivery: Secondary | ICD-10-CM | POA: Diagnosis not present

## 2022-12-25 DIAGNOSIS — O99824 Streptococcus B carrier state complicating childbirth: Secondary | ICD-10-CM | POA: Diagnosis not present

## 2022-12-25 DIAGNOSIS — Z98891 History of uterine scar from previous surgery: Secondary | ICD-10-CM | POA: Insufficient documentation

## 2022-12-25 DIAGNOSIS — O09519 Supervision of elderly primigravida, unspecified trimester: Secondary | ICD-10-CM | POA: Diagnosis not present

## 2022-12-25 DIAGNOSIS — Z3A39 39 weeks gestation of pregnancy: Secondary | ICD-10-CM | POA: Diagnosis not present

## 2022-12-25 HISTORY — DX: Cardiac arrhythmia, unspecified: I49.9

## 2022-12-25 LAB — CBC
HCT: 38.7 % (ref 36.0–46.0)
Hemoglobin: 12.1 g/dL (ref 12.0–15.0)
MCH: 26.7 pg (ref 26.0–34.0)
MCHC: 31.3 g/dL (ref 30.0–36.0)
MCV: 85.2 fL (ref 80.0–100.0)
Platelets: 294 10*3/uL (ref 150–400)
RBC: 4.54 MIL/uL (ref 3.87–5.11)
RDW: 14.1 % (ref 11.5–15.5)
WBC: 7.9 10*3/uL (ref 4.0–10.5)
nRBC: 0 % (ref 0.0–0.2)

## 2022-12-25 LAB — RPR: RPR Ser Ql: NONREACTIVE

## 2022-12-25 LAB — TYPE AND SCREEN
ABO/RH(D): B POS
Antibody Screen: NEGATIVE

## 2022-12-26 NOTE — H&P (Signed)
NIXIE LAUBE is a 37 y.o. female presenting for scheduled ERLTCS+BS.  Complicated PNC including the following: 1) H/o Preterm delivery and c-section - G1 @ 36wks for NRFS followed by ERLTCS 2) BMI 39.5 with h/o bariatric procedure: S/p duodenal switch with subsequent 450lb wt loss - GS on 1/17: -BPP 8/8, normal dopplers. AC 4.8%tile but overall EFW 14.8%tile/2657g/5lb15oz -VItD, B12, Folate WNL 3) H/o paroxysmal atrial tachycardia: s/p ablation, asx; Cards f.u planned > s/p monitor, no further w/u at this time; Fetal echo WNL 4) Resolved IUGR: @ 20 6/7: EFW 6th%tile/305g. CL 4.8cm, normal dopplers > RESOLVED as of 11/1>AC 4.2% @ 37 5/7, EFW 14.8%tile 5) Multiple Sclerosis -  Relapsing-remitting, follows w/ Neuro, exposure to Zeposia > Now Copaxone (cat B) - weekly BPP reassuring 6) Perirectal Abscess - s/p I&D on 10/11/22 with Penrose drain placement - completed IV and PO antibiotics, has Gen Surg f/u postpartum 7) Chronic tobacco use - previously 1/4 PPD > transitioned to patches as of 09/2022  Of note, [name]: incr r/o malignant hyperthermia with isoflurane anesthetic induction AMA on baby ASA with low risk NIPT. GBS pos with no PCN allergy (SULFA, NSAID only)  OB History     Gravida  4   Para  2   Term  1   Preterm  1   AB  1   Living  2      SAB  1   IAB      Ectopic      Multiple  0   Live Births  2          Past Medical History:  Diagnosis Date   Arthritis of low back    Complication of anesthesia    is of [name] descent   Dysrhythmia    hx PAT   Family history of adverse reaction to anesthesia    pt is a L[name]   GERD (gastroesophageal reflux disease)    none since pregnancy    Heart murmur    When pt. was pregnant with her last child, and also had murmur as a child.     Hemorrhoids    History of anal fissures    History of PAT (paroxysmal atrial tachycardia)    had cardiac ablation as a teenager   History of sleep apnea    none  since 450 pound weight loss in 2015   Migraines    MS (multiple sclerosis) (HCC)    PCOD (polycystic ovarian disease)    no menses   Past Surgical History:  Procedure Laterality Date   BREAST LUMPECTOMY Left 2011   papilloma   BREAST LUMPECTOMY Left 2015   papilloma   BREAST LUMPECTOMY WITH RADIOACTIVE SEED LOCALIZATION Left 10/18/2014   Procedure:  RADIOACTIVE SEED GUIDED EXCISIONAL BREAST BIOPSY;  Surgeon: Alphonsa Overall, MD;  Location: [facility];  Service: General;  Laterality: Left;   BREAST SURGERY Right 07/27/2004   retro-areolar dissection with exc. of large duct   CARDIAC ELECTROPHYSIOLOGY MAPPING AND ABLATION  sge 13   CARDIAC ELECTROPHYSIOLOGY STUDY AND ABLATION  age 37   CESAREAN SECTION N/A 03/28/2020   Procedure: [place];  Surgeon: Deliah Boston, MD;  Location: [facility];  Service: Obstetrics;  Laterality: N/A;  RNFA [place]   CESAREAN SECTION  03/2018   CHOLECYSTECTOMY  04/27/2008   GASTROPLASTY DUODENAL SWITCH  01/2014   done at [place] Bilateral 10/11/2022   Procedure: Incision and drainage of Bilateral  Perirectoal abscess, Possible Seton;  Surgeon: Rolm Bookbinder, MD;  Location: [facility];  Service: General;  Laterality: Bilateral;   INCISION AND DRAINAGE PERIRECTAL ABSCESS N/A 08/15/2020   Procedure: ANORECTAL EXAM UNDER ANESTHESIA INCISION AND DRAINAGE OF PERIRECTAL ABSCESS x2 WITH PLACEMENT OF SETON;  Surgeon: Ileana Roup, MD;  Location: [facility];  Service: General;  Laterality: N/A;   OVARIAN CYST REMOVAL Bilateral 1999   PLACEMENT OF SETON  08/15/2020   PLACEMENT OF SETON N/A 12/08/2020   Procedure: PLACEMENT OF CUTTING SETON; PARTIAL FISTULOTOMY;  Surgeon: Ileana Roup, MD;  Location: [facility];  Service: General;  Laterality: N/A;   RECTAL EXAM UNDER ANESTHESIA N/A 12/08/2020   Procedure: ANORECTAL EXAM UNDER ANESTHESIA;  Surgeon: Ileana Roup, MD;  Location: [facility];  Service: General;  Laterality: N/A;   TOENAIL EXCISION  yrs ago   UNILATERAL SALPINGECTOMY     Family History: family history includes Cancer in her maternal grandmother and paternal grandfather; Diabetes in her maternal grandfather and mother; Heart disease in her maternal grandmother; Hyperlipidemia in her father; Hypertension in her father and mother. Social History:  reports that she has been smoking cigarettes. She has a 5.00 pack-year smoking history. She has never used smokeless tobacco. She reports that she does not drink alcohol and does not use drugs.     Maternal Diabetes: No Genetic Screening: Normal Maternal Ultrasounds/Referrals: IUGR --> resolved Fetal Ultrasounds or other Referrals:  Fetal echo Maternal Substance Abuse:  Yes:  Type: Smoker Significant Maternal Medications:  None Significant Maternal Lab Results:  Group B Strep positive Number of Prenatal Visits:greater than 3 verified prenatal visits Other Comments:  None  Review of Systems  Constitutional:  Negative for chills and fever.  Respiratory:  Negative for shortness of breath.   Cardiovascular:  Negative for chest pain, palpitations and leg swelling.  Gastrointestinal:  Negative for abdominal pain and vomiting.  Neurological:  Negative for dizziness, weakness and headaches.  Psychiatric/Behavioral:  Negative for suicidal ideas.    History   Last menstrual period 03/29/2022. Exam Physical Exam Constitutional:      General: She is not in acute distress.    Appearance: She is well-developed.  HENT:     Head: Normocephalic and atraumatic.  Eyes:     Pupils: Pupils are equal, round, and reactive to light.  Cardiovascular:     Rate and Rhythm: Normal rate and regular rhythm.     Heart sounds: No murmur heard.    No gallop.  Abdominal:     Tenderness: There is no abdominal tenderness. There is no guarding or rebound.  Genitourinary:    Vagina: Normal.  Musculoskeletal:         General: Normal range of motion.     Cervical back: Normal range of motion and neck supple.  Skin:    General: Skin is warm and dry.  Neurological:     Mental Status: She is alert and oriented to person, place, and time.     Prenatal labs: ABO, Rh: --/--/B POS (01/24 0914) Antibody: NEG (01/24 0914) Rubella: Immune (07/07 0000) RPR: NON REACTIVE (01/24 0914)  HBsAg: Negative (07/07 0000)  HIV: Non-reactive (07/07 0000)  GBS:   POS  Assessment/Plan: This is a 37yo [id number] @ 31 0/7 by LMP c/w 9wk TVUS admitted for scheduled ERLTCS+BS for h/o csx x2 with satisfied fertility. Anesthesia aware of [name] heritage for induction purposes. Patient has brought MS medication with her for duration of inpatient  stay to be verified with pharmacy R/B/A of cesarean section discussed with patient. Alternative would be vaginal delivery which would mean shorter postpartum stay and decreased risk of bleeding. Risks of section include infection of the uterus, pelvic organs, or skin, inadvertent injury to internal organs, such as bowel or bladder. If there is major injury, extensive surgery may be required. If injury is minor, it may be treated with relative ease. Discussed possibility of excessive blood loss and transfusion. If bleeding cannot be controlled using medical or minor surgical methods, a cesarean hysterectomy may be performed which would mean no future fertility. Patient accepts the possibility of blood transfusion, if necessary. Patient understands and agrees to move forward with section.    Melida Quitter Donnae Michels 12/26/2022, 6:00 PM

## 2022-12-27 ENCOUNTER — Inpatient Hospital Stay (HOSPITAL_COMMUNITY): Payer: 59 | Admitting: Certified Registered"

## 2022-12-27 ENCOUNTER — Encounter (HOSPITAL_COMMUNITY)
Admission: RE | Disposition: A | Discharge status: Home / Self Care | Payer: Self-pay | Source: Ambulatory Visit | Attending: Obstetrics and Gynecology

## 2022-12-27 ENCOUNTER — Inpatient Hospital Stay (HOSPITAL_COMMUNITY)
Admission: RE | Admit: 2022-12-27 | Discharge: 2022-12-29 | DRG: 784 | Disposition: A | Discharge status: Home / Self Care | Payer: 59 | Source: Ambulatory Visit | Attending: Obstetrics and Gynecology | Admitting: Obstetrics and Gynecology

## 2022-12-27 ENCOUNTER — Other Ambulatory Visit: Payer: Self-pay

## 2022-12-27 ENCOUNTER — Encounter (HOSPITAL_COMMUNITY): Payer: Self-pay | Admitting: Obstetrics and Gynecology

## 2022-12-27 ENCOUNTER — Encounter: Payer: Self-pay | Admitting: Hematology and Oncology

## 2022-12-27 DIAGNOSIS — Z3A39 39 weeks gestation of pregnancy: Secondary | ICD-10-CM

## 2022-12-27 DIAGNOSIS — O34211 Maternal care for low transverse scar from previous cesarean delivery: Secondary | ICD-10-CM | POA: Diagnosis present

## 2022-12-27 DIAGNOSIS — G35 Multiple sclerosis: Secondary | ICD-10-CM | POA: Diagnosis present

## 2022-12-27 DIAGNOSIS — F17210 Nicotine dependence, cigarettes, uncomplicated: Secondary | ICD-10-CM | POA: Diagnosis present

## 2022-12-27 DIAGNOSIS — Z302 Encounter for sterilization: Secondary | ICD-10-CM

## 2022-12-27 DIAGNOSIS — O99334 Smoking (tobacco) complicating childbirth: Secondary | ICD-10-CM | POA: Diagnosis present

## 2022-12-27 DIAGNOSIS — Z98891 History of uterine scar from previous surgery: Secondary | ICD-10-CM

## 2022-12-27 DIAGNOSIS — O165 Unspecified maternal hypertension, complicating the puerperium: Secondary | ICD-10-CM | POA: Diagnosis present

## 2022-12-27 DIAGNOSIS — O99354 Diseases of the nervous system complicating childbirth: Secondary | ICD-10-CM | POA: Diagnosis present

## 2022-12-27 DIAGNOSIS — O99824 Streptococcus B carrier state complicating childbirth: Secondary | ICD-10-CM | POA: Diagnosis present

## 2022-12-27 DIAGNOSIS — O34219 Maternal care for unspecified type scar from previous cesarean delivery: Secondary | ICD-10-CM | POA: Diagnosis not present

## 2022-12-27 LAB — CREATININE, SERUM
Creatinine, Ser: 0.46 mg/dL (ref 0.44–1.00)
GFR, Estimated: >60 mL/min (ref >60)

## 2022-12-27 LAB — CBC
HCT: 38.1 % (ref 36.0–46.0)
Hemoglobin: 12.3 g/dL (ref 12.0–15.0)
MCH: 26.8 pg (ref 26.0–34.0)
MCHC: 32.3 g/dL (ref 30.0–36.0)
MCV: 83 fL (ref 80.0–100.0)
Platelets: 281 10*3/uL (ref 150–400)
RBC: 4.59 MIL/uL (ref 3.87–5.11)
RDW: 14.1 % (ref 11.5–15.5)
WBC: 9.9 10*3/uL (ref 4.0–10.5)
nRBC: 0 % (ref 0.0–0.2)

## 2022-12-27 LAB — COMPREHENSIVE METABOLIC PANEL
ALT: 16 U/L (ref 0–44)
AST: 22 U/L (ref 15–41)
Albumin: 2.3 g/dL — ABNORMAL LOW (ref 3.5–5.0)
Alkaline Phosphatase: 69 U/L (ref 38–126)
Anion gap: 7 (ref 5–15)
BUN: 7 mg/dL (ref 6–20)
CO2: 21 mmol/L — ABNORMAL LOW (ref 22–32)
Calcium: 8.1 mg/dL — ABNORMAL LOW (ref 8.9–10.3)
Chloride: 109 mmol/L (ref 98–111)
Creatinine, Ser: 0.64 mg/dL (ref 0.44–1.00)
GFR, Estimated: >60 mL/min (ref >60)
Glucose, Bld: 95 mg/dL (ref 70–99)
Potassium: 4 mmol/L (ref 3.5–5.1)
Sodium: 137 mmol/L (ref 135–145)
Total Bilirubin: 0.3 mg/dL (ref 0.3–1.2)
Total Protein: 5.1 g/dL — ABNORMAL LOW (ref 6.5–8.1)

## 2022-12-27 SURGERY — Surgical Case
Anesthesia: Spinal | Laterality: Bilateral

## 2022-12-27 MED ORDER — STERILE WATER FOR IRRIGATION IR SOLN
Status: DC | PRN
  Administered 2022-12-27: 1000 mL

## 2022-12-27 MED ORDER — ONDANSETRON HCL 4 MG/2ML IJ SOLN
INTRAMUSCULAR | Status: DC | PRN
  Administered 2022-12-27: 4 mg via INTRAVENOUS

## 2022-12-27 MED ORDER — DIBUCAINE (PERIANAL) 1 % EX OINT
1.0000 | TOPICAL_OINTMENT | CUTANEOUS | Status: DC | PRN
  Administered 2022-12-28: 1 via RECTAL
  Filled 2022-12-27: qty 28

## 2022-12-27 MED ORDER — SENNOSIDES-DOCUSATE SODIUM 8.6-50 MG PO TABS
2.0000 | ORAL_TABLET | Freq: Every day | ORAL | Status: DC
  Administered 2022-12-28 – 2022-12-29 (×2): 2 via ORAL
  Filled 2022-12-27 (×2): qty 2

## 2022-12-27 MED ORDER — ACETAMINOPHEN 500 MG PO TABS: 1000.0000 mg | ORAL_TABLET | Freq: Once | ORAL | Status: DC | PRN

## 2022-12-27 MED ORDER — DEXAMETHASONE SODIUM PHOSPHATE 10 MG/ML IJ SOLN
INTRAMUSCULAR | Status: AC
  Filled 2022-12-27: qty 1

## 2022-12-27 MED ORDER — POVIDONE-IODINE 10 % EX SWAB
2.0000 | Freq: Once | CUTANEOUS | Status: AC
  Administered 2022-12-27: 2 via TOPICAL

## 2022-12-27 MED ORDER — PHENYLEPHRINE HCL-NACL 20-0.9 MG/250ML-% IV SOLN
INTRAVENOUS | Status: AC
  Filled 2022-12-27: qty 250

## 2022-12-27 MED ORDER — NICOTINE 14 MG/24HR TD PT24
14.0000 mg | MEDICATED_PATCH | Freq: Every day | TRANSDERMAL | Status: DC
  Administered 2022-12-27 – 2022-12-29 (×3): 14 mg via TRANSDERMAL
  Filled 2022-12-27 (×3): qty 1

## 2022-12-27 MED ORDER — SERTRALINE HCL 50 MG PO TABS
50.0000 mg | ORAL_TABLET | Freq: Every day | ORAL | Status: DC
  Administered 2022-12-28 – 2022-12-29 (×2): 50 mg via ORAL
  Filled 2022-12-27 (×3): qty 1

## 2022-12-27 MED ORDER — DEXAMETHASONE SODIUM PHOSPHATE 10 MG/ML IJ SOLN
INTRAMUSCULAR | Status: DC | PRN
  Administered 2022-12-27: 10 mg via INTRAVENOUS

## 2022-12-27 MED ORDER — NIFEDIPINE ER OSMOTIC RELEASE 30 MG PO TB24
30.0000 mg | ORAL_TABLET | Freq: Every day | ORAL | Status: DC
  Administered 2022-12-27 – 2022-12-29 (×3): 30 mg via ORAL
  Filled 2022-12-27 (×3): qty 1

## 2022-12-27 MED ORDER — SIMETHICONE 80 MG PO CHEW
80.0000 mg | CHEWABLE_TABLET | Freq: Three times a day (TID) | ORAL | Status: DC
  Administered 2022-12-27 – 2022-12-29 (×5): 80 mg via ORAL
  Filled 2022-12-27 (×6): qty 1

## 2022-12-27 MED ORDER — ZOLPIDEM TARTRATE 5 MG PO TABS: 5.0000 mg | ORAL_TABLET | Freq: Every evening | ORAL | Status: DC | PRN

## 2022-12-27 MED ORDER — ACETAMINOPHEN 10 MG/ML IV SOLN
INTRAVENOUS | Status: AC
  Filled 2022-12-27: qty 100

## 2022-12-27 MED ORDER — PHENYLEPHRINE HCL-NACL 20-0.9 MG/250ML-% IV SOLN
INTRAVENOUS | Status: DC | PRN
  Administered 2022-12-27: 80 ug/min via INTRAVENOUS

## 2022-12-27 MED ORDER — MORPHINE SULFATE (PF) 0.5 MG/ML IJ SOLN
INTRAMUSCULAR | Status: AC
  Filled 2022-12-27: qty 10

## 2022-12-27 MED ORDER — DIPHENHYDRAMINE HCL 50 MG/ML IJ SOLN
12.5000 mg | Freq: Once | INTRAMUSCULAR | Status: AC
  Administered 2022-12-27: 12.5 mg via INTRAVENOUS

## 2022-12-27 MED ORDER — LACTATED RINGERS IV SOLN: INTRAVENOUS | Status: DC

## 2022-12-27 MED ORDER — ACETAMINOPHEN 160 MG/5ML PO SOLN: 1000.0000 mg | Freq: Once | ORAL | Status: DC | PRN

## 2022-12-27 MED ORDER — CEFAZOLIN IN SODIUM CHLORIDE 3-0.9 GM/100ML-% IV SOLN
INTRAVENOUS | Status: AC
  Filled 2022-12-27: qty 100

## 2022-12-27 MED ORDER — OXYTOCIN-SODIUM CHLORIDE 30-0.9 UT/500ML-% IV SOLN
2.5000 [IU]/h | INTRAVENOUS | Status: AC
  Administered 2022-12-27: 2.5 [IU]/h via INTRAVENOUS
  Filled 2022-12-27: qty 500

## 2022-12-27 MED ORDER — OXYCODONE HCL 5 MG PO TABS
5.0000 mg | ORAL_TABLET | ORAL | Status: DC | PRN
  Administered 2022-12-27: 10 mg via ORAL
  Administered 2022-12-28: 5 mg via ORAL
  Administered 2022-12-28 (×2): 10 mg via ORAL
  Administered 2022-12-29 (×2): 5 mg via ORAL
  Filled 2022-12-27: qty 2
  Filled 2022-12-27 (×2): qty 1
  Filled 2022-12-27: qty 2
  Filled 2022-12-27: qty 1
  Filled 2022-12-27: qty 2

## 2022-12-27 MED ORDER — OXYTOCIN-SODIUM CHLORIDE 30-0.9 UT/500ML-% IV SOLN
INTRAVENOUS | Status: AC
  Filled 2022-12-27: qty 500

## 2022-12-27 MED ORDER — OXYTOCIN-SODIUM CHLORIDE 30-0.9 UT/500ML-% IV SOLN
INTRAVENOUS | Status: DC | PRN
  Administered 2022-12-27: 41.7 mL/h via INTRAVENOUS

## 2022-12-27 MED ORDER — ACETAMINOPHEN 500 MG PO TABS
1000.0000 mg | ORAL_TABLET | Freq: Four times a day (QID) | ORAL | Status: DC
  Administered 2022-12-27 – 2022-12-29 (×7): 1000 mg via ORAL
  Filled 2022-12-27 (×8): qty 2

## 2022-12-27 MED ORDER — NALBUPHINE HCL 10 MG/ML IJ SOLN
5.0000 mg | INTRAMUSCULAR | Status: DC | PRN
  Administered 2022-12-27: 5 mg via INTRAVENOUS
  Filled 2022-12-27: qty 1

## 2022-12-27 MED ORDER — BUPIVACAINE IN DEXTROSE 0.75-8.25 % IT SOLN
INTRATHECAL | Status: DC | PRN
  Administered 2022-12-27: 1.8 mL via INTRATHECAL

## 2022-12-27 MED ORDER — PRENATAL MULTIVITAMIN CH
1.0000 | ORAL_TABLET | Freq: Every day | ORAL | Status: DC
  Administered 2022-12-27 – 2022-12-28 (×2): 1 via ORAL
  Filled 2022-12-27 (×2): qty 1

## 2022-12-27 MED ORDER — ONDANSETRON HCL 4 MG/2ML IJ SOLN
INTRAMUSCULAR | Status: AC
  Filled 2022-12-27: qty 2

## 2022-12-27 MED ORDER — COCONUT OIL OIL: 1.0000 | TOPICAL_OIL | Status: DC | PRN

## 2022-12-27 MED ORDER — MORPHINE SULFATE (PF) 0.5 MG/ML IJ SOLN
INTRAMUSCULAR | Status: DC | PRN
  Administered 2022-12-27: 150 ug via INTRATHECAL

## 2022-12-27 MED ORDER — WITCH HAZEL-GLYCERIN EX PADS
1.0000 | MEDICATED_PAD | CUTANEOUS | Status: DC | PRN
  Administered 2022-12-27: 1 via TOPICAL

## 2022-12-27 MED ORDER — PHENYLEPHRINE 80 MCG/ML (10ML) SYRINGE FOR IV PUSH (FOR BLOOD PRESSURE SUPPORT)
PREFILLED_SYRINGE | INTRAVENOUS | Status: DC | PRN
  Administered 2022-12-27 (×2): 80 ug via INTRAVENOUS

## 2022-12-27 MED ORDER — SIMETHICONE 80 MG PO CHEW: 80.0000 mg | CHEWABLE_TABLET | ORAL | Status: DC | PRN

## 2022-12-27 MED ORDER — MENTHOL 3 MG MT LOZG: 1.0000 | LOZENGE | OROMUCOSAL | Status: DC | PRN

## 2022-12-27 MED ORDER — ENOXAPARIN SODIUM 60 MG/0.6ML IJ SOSY
60.0000 mg | PREFILLED_SYRINGE | INTRAMUSCULAR | Status: DC
  Administered 2022-12-28 – 2022-12-29 (×2): 60 mg via SUBCUTANEOUS
  Filled 2022-12-27 (×2): qty 0.6

## 2022-12-27 MED ORDER — FENTANYL CITRATE (PF) 100 MCG/2ML IJ SOLN
INTRAMUSCULAR | Status: DC | PRN
  Administered 2022-12-27: 15 ug via INTRATHECAL

## 2022-12-27 MED ORDER — FENTANYL CITRATE (PF) 100 MCG/2ML IJ SOLN
INTRAMUSCULAR | Status: AC
  Filled 2022-12-27: qty 2

## 2022-12-27 MED ORDER — DIPHENHYDRAMINE HCL 25 MG PO CAPS
25.0000 mg | ORAL_CAPSULE | Freq: Four times a day (QID) | ORAL | Status: DC | PRN
  Administered 2022-12-27 – 2022-12-28 (×2): 25 mg via ORAL
  Filled 2022-12-27 (×2): qty 1

## 2022-12-27 MED ORDER — ACETAMINOPHEN 10 MG/ML IV SOLN
1000.0000 mg | Freq: Once | INTRAVENOUS | Status: DC | PRN
  Administered 2022-12-27: 1000 mg via INTRAVENOUS

## 2022-12-27 MED ORDER — SOD CITRATE-CITRIC ACID 500-334 MG/5ML PO SOLN
ORAL | Status: AC
  Filled 2022-12-27: qty 30

## 2022-12-27 MED ORDER — DIPHENHYDRAMINE HCL 50 MG/ML IJ SOLN
INTRAMUSCULAR | Status: AC
  Filled 2022-12-27: qty 1

## 2022-12-27 MED ORDER — SODIUM CHLORIDE 0.9 % IR SOLN
Status: DC | PRN
  Administered 2022-12-27: 1

## 2022-12-27 MED ORDER — FENTANYL CITRATE (PF) 100 MCG/2ML IJ SOLN: 25.0000 ug | INTRAMUSCULAR | Status: DC | PRN

## 2022-12-27 MED ORDER — KETOROLAC TROMETHAMINE 30 MG/ML IJ SOLN
30.0000 mg | Freq: Four times a day (QID) | INTRAMUSCULAR | Status: AC
  Administered 2022-12-27 – 2022-12-28 (×3): 30 mg via INTRAVENOUS
  Filled 2022-12-27 (×4): qty 1

## 2022-12-27 MED ORDER — CEFAZOLIN IN SODIUM CHLORIDE 3-0.9 GM/100ML-% IV SOLN
3.0000 g | INTRAVENOUS | Status: AC
  Administered 2022-12-27: 3 g via INTRAVENOUS

## 2022-12-27 SURGICAL SUPPLY — 38 items
ADH SKN CLS APL DERMABOND .7 (GAUZE/BANDAGES/DRESSINGS) ×1
ADH SKN CLS LQ APL DERMABOND (GAUZE/BANDAGES/DRESSINGS) ×1
CHLORAPREP W/TINT 26ML (MISCELLANEOUS) ×2 IMPLANT
CLAMP UMBILICAL CORD (MISCELLANEOUS) ×1 IMPLANT
CLOTH BEACON ORANGE TIMEOUT ST (SAFETY) ×1 IMPLANT
DERMABOND ADVANCED .7 DNX12 (GAUZE/BANDAGES/DRESSINGS) ×1 IMPLANT
DERMABOND ADVANCED .7 DNX6 (GAUZE/BANDAGES/DRESSINGS) IMPLANT
DRSG OPSITE POSTOP 4X10 (GAUZE/BANDAGES/DRESSINGS) ×1 IMPLANT
ELECT REM PT RETURN 9FT ADLT (ELECTROSURGICAL) ×1
ELECTRODE REM PT RTRN 9FT ADLT (ELECTROSURGICAL) ×1 IMPLANT
EXTRACTOR VACUUM BELL STYLE (SUCTIONS) IMPLANT
GAUZE SPONGE 4X4 12PLY STRL LF (GAUZE/BANDAGES/DRESSINGS) IMPLANT
GLOVE BIO SURGEON STRL SZ 6.5 (GLOVE) ×1 IMPLANT
GLOVE BIOGEL PI IND STRL 6.5 (GLOVE) ×1 IMPLANT
GLOVE BIOGEL PI IND STRL 7.0 (GLOVE) ×2 IMPLANT
GOWN STRL REUS W/TWL LRG LVL3 (GOWN DISPOSABLE) ×2 IMPLANT
HEMOSTAT ARISTA ABSORB 3G PWDR (HEMOSTASIS) IMPLANT
KIT ABG SYR 3ML LUER SLIP (SYRINGE) IMPLANT
NDL HYPO 25X5/8 SAFETYGLIDE (NEEDLE) IMPLANT
NEEDLE HYPO 25X5/8 SAFETYGLIDE (NEEDLE) IMPLANT
NS IRRIG 1000ML POUR BTL (IV SOLUTION) ×1 IMPLANT
PACK C SECTION WH (CUSTOM PROCEDURE TRAY) ×1 IMPLANT
PAD ABD 8X10 STRL (GAUZE/BANDAGES/DRESSINGS) IMPLANT
PAD OB MATERNITY 4.3X12.25 (PERSONAL CARE ITEMS) ×1 IMPLANT
PENCIL SMOKE EVAC W/HOLSTER (ELECTROSURGICAL) ×1 IMPLANT
RTRCTR C-SECT PINK 25CM LRG (MISCELLANEOUS) IMPLANT
SUT PDS AB 0 CTX 36 PDP370T (SUTURE) IMPLANT
SUT PLAIN 2 0 (SUTURE)
SUT PLAIN ABS 2-0 CT1 27XMFL (SUTURE) IMPLANT
SUT VIC AB 0 CT1 36 (SUTURE) ×2 IMPLANT
SUT VIC AB 2-0 CT1 27 (SUTURE) ×1
SUT VIC AB 2-0 CT1 TAPERPNT 27 (SUTURE) ×1 IMPLANT
SUT VIC AB 3-0 SH 27 (SUTURE)
SUT VIC AB 3-0 SH 27X BRD (SUTURE) IMPLANT
SUT VIC AB 4-0 KS 27 (SUTURE) ×1 IMPLANT
TOWEL OR 17X24 6PK STRL BLUE (TOWEL DISPOSABLE) ×1 IMPLANT
TRAY FOLEY W/BAG SLVR 14FR LF (SET/KITS/TRAYS/PACK) ×1 IMPLANT
WATER STERILE IRR 1000ML POUR (IV SOLUTION) ×1 IMPLANT

## 2022-12-27 NOTE — Interval H&P Note (Signed)
History and Physical Interval Note:  12/27/2022 7:32 AM  Lynn Roberts  has presented today for surgery, with the diagnosis of repeat c-section, sterilization.  The various methods of treatment have been discussed with the patient and family. After consideration of risks, benefits and other options for treatment, the patient has consented to  Procedure(s) with comments: [place] (Bilateral) - incr r/o malignant hyperthermia with isoflurane anesthetic induction as patient is [name] as a surgical intervention.  The patient's history has been reviewed, patient examined, no change in status, stable for surgery.  I have reviewed the patient's chart and labs.  Questions were answered to the patient's satisfaction.    Patient has MS medication with her, takes 3x/wk, will have family bring with her for pharmacy verification during inpatient stay.   [name]

## 2022-12-27 NOTE — Anesthesia Preprocedure Evaluation (Addendum)
Anesthesia Evaluation  Patient identified by MRN, date of birth, ID band Patient awake    Reviewed: Allergy & Precautions, NPO status , Patient's Chart, lab work & pertinent test results  Airway Mallampati: I  TM Distance: >3 FB Neck ROM: Full    Dental  (+) Dental Advisory Given, Teeth Intact   Pulmonary neg shortness of breath, neg sleep apnea, neg COPD, neg recent URI, Current Smoker and Patient abstained from smoking.   breath sounds clear to auscultation       Cardiovascular (-) hypertension(-) Past MI and (-) CHF negative cardio ROS  Rhythm:Regular     Neuro/Psych  Headaches  negative psych ROS   GI/Hepatic ,GERD  Medicated,,  Endo/Other    Renal/GU      Musculoskeletal   Abdominal   Peds  Hematology Lab Results      Component                Value               Date                      WBC                      7.9                 12/25/2022                HGB                      12.1                12/25/2022                HCT                      38.7                12/25/2022                MCV                      85.2                12/25/2022                PLT                      294                 12/25/2022            Denies blood thinners   Anesthesia Other Findings S/p gastric bypass  Reproductive/Obstetrics                             Anesthesia Physical Anesthesia Plan  ASA: 2  Anesthesia Plan: Spinal   Post-op Pain Management:    Induction: Intravenous  PONV Risk Score and Plan: 1 and Ondansetron and Dexamethasone  Airway Management Planned: Nasal Cannula and Natural Airway  Additional Equipment: None  Intra-op Plan:   Post-operative Plan:   Informed Consent: I have reviewed the patients History and Physical, chart, labs and discussed the procedure including the risks, benefits and alternatives for the proposed anesthesia with the patient or authorized  representative who has indicated his/her  understanding and acceptance.     Dental advisory given  Plan Discussed with: CRNA  Anesthesia Plan Comments:        Anesthesia Quick Evaluation

## 2022-12-27 NOTE — Progress Notes (Signed)
Patient is up and ambulating in room. Pain level is a 7 out of 10.  She had IV Toradol at 1215 and it's not time for PO Tylenol until 1600. Dr. Ermalene Postin notified. Orders to start PO oxycodone as ordered.

## 2022-12-27 NOTE — Anesthesia Postprocedure Evaluation (Signed)
Anesthesia Post Note  Patient: Lynn Roberts  Procedure(s) Performed: CESAREAN SECTION WITH BILATERAL TUBAL LIGATION (Bilateral)     Patient location during evaluation: PACU Anesthesia Type: Spinal Level of consciousness: awake and alert Pain management: pain level controlled Vital Signs Assessment: post-procedure vital signs reviewed and stable Respiratory status: spontaneous breathing, nonlabored ventilation and respiratory function stable Cardiovascular status: stable and blood pressure returned to baseline Postop Assessment: no apparent nausea or vomiting Anesthetic complications: no   No notable events documented.  Last Vitals:  Vitals:   12/27/22 1025 12/27/22 1122  BP: 108/89 128/78  Pulse: (!) 49 (!) 53  Resp: 18 18  Temp: (!) 36.1 C (!) 36.3 C  SpO2: 98% 100%    Last Pain:  Vitals:   12/27/22 1315  TempSrc:   PainSc: 7                  Link Burgeson

## 2022-12-27 NOTE — Progress Notes (Signed)
B/P sitting 137/83, standing 139/94. Rechecked after patient back in bed and B/P was 149/94. Patient's pain 7 out of 10 when up walking in room otherwise asymptomatic. Notified Dr. Willis Modena. Will recheck B/P at next routine time and will notify if still elevated per Dr. Willis Modena.

## 2022-12-27 NOTE — Anesthesia Procedure Notes (Signed)
Spinal  Patient location during procedure: OR Start time: 12/27/2022 7:38 AM End time: 12/27/2022 7:40 AM Reason for block: surgical anesthesia Staffing Performed: anesthesiologist  Anesthesiologist: Oleta Mouse, MD Performed by: Oleta Mouse, MD Authorized by: Oleta Mouse, MD   Preanesthetic Checklist Completed: patient identified, IV checked, risks and benefits discussed, surgical consent, monitors and equipment checked, pre-op evaluation and timeout performed Spinal Block Patient position: sitting Prep: DuraPrep Patient monitoring: heart rate, cardiac monitor, continuous pulse ox and blood pressure Approach: midline Location: L4-5 Injection technique: single-shot Needle Needle type: Pencan  Needle gauge: 24 G Needle length: 9 cm Assessment Sensory level: T6 Events: CSF return

## 2022-12-27 NOTE — Op Note (Signed)
C-Section Operative Note  Date: 12/27/22 Preoperative Diagnosis: IUP @ 39 0/7, h/o csx, satisfied fertility Postoperative Diagnosis: same as above, s/p LS (not in chart) Procedure: ERLTCS, right salpingectomy Findings: Viable female infant weighing 6lb6oz with APGARS of 8 and 9 at 1 and 5 minutes, respectively. Normal appearing uterus, unilateral (right) fallopian tube and ovaries. Significant adhesions noted between peritoneum/scant rectus belly fibers and rectus fascia, very thin layer prior to intra-abdominal entry. No other adhesions noted intra-abdominally. Absent left fallopian tube with suture present at left cornua consistent with prior salpingectomy.  Specimens: Placenta to L&D EBL 556cc IVF 57cc UOP 300cc  Patient Course: Admitted for scheduled ERLTCS - unsure if prior salpingectomy or not, still desires permanent sterilization, consents for procedure  Consent:  R/B/A of cesarean section discussed with patient. Alternative would be vaginal delivery which would mean shorter postpartum stay and decreased risk of bleeding. Risks of section include infection of the uterus, pelvic organs, or skin, inadvertent injury to internal organs, such as bowel or bladder. If there is major injury, extensive surgery may be required. If injury is minor, it may be treated with relative ease. Discussed possibility of excessive blood loss and transfusion. If bleeding cannot be controlled using medical or minor surgical methods, a cesarean hysterectomy may be performed which would mean no future fertility. Patient accepts the possibility of blood transfusion, if necessary. Patient understands and agrees to move forward with section.     Operative Procedure: Given patient BMI, 3g Ancef was given preoperatively.Patient was taken to the operating room where epidural anesthesia was found to be adequate by Allis clamp test. She was prepped and draped in the normal sterile fashion in the dorsal supine position  with a leftward tilt. Prior Joel-Cohen incision was easily visualized. Patient's prior weight loss created a long but thin pannus that distorted suprapubic anatomy. An appropriate time out was performed. A Joel-Cohen skin incision was then made with the scalpel and carried through to the underlying layer of fascia by sharp dissection and Bovie cautery. The fascia was nicked in the midline and the incision was extended laterally with Mayo scissors. The superior aspect of the incision was grasped Coker clamps in an attempt to dissect off of rectus bellies. However, minimal dissection was carried out given significant adhesive burden as mentioned in findings. Scant rectus fibers dissected off superior aspect of rectus fascia, abdominal cavity entry noted at this point. The Alexis self-retaining wound retractor was then placed within the incision and the lower uterine segment exposed. No intra-abdominal adhesions noted. The bladder flap was developed with Metzenbaum scissors and pushed away from the lower uterine segment. The lower uterine segment was then incised in a low transverse fashion and the cavity itself entered bluntly. The incision was extended bluntly. Amniotic sac was ruptured and fluid was noted to be clear in color. The infant's head was then lifted and delivered from the incision without difficulty using the standard movements. The remainder of the infant delivered and the nose and mouth bulb suctioned with the cord clamped and cut as well. The infant was handed off to NICU. The placenta was then spontaneously expressed from the uterus and the uterus cleared of all clots and debris with moist lap sponge. The uterine incision was then repaired in 2 layers the first layer was a running locked layer of 0-vicryl and the second an imbricating layer of the same suture. f Arista placed over hysterotomy, excellent hemostasis noted again. During hysterotomy repair, redundant bowel noted which was packed  away  using moist lap sponge. The right fallopian and ovaries were inspected and the gutters cleared of all clots and debris. The uterine incision was inspected and found to be hemostatic.  Attention then turned to right fallopian tube which was suture ligated and removed with 2 Kelly clamps and 2-0 vicryl. Figure of 8 x2 used for hemostasis along excision site of mesosalpinx with excellent results.  All instruments and sponges as well as the Alexis retractor were then removed from the abdomen. The fascia was then closed with 1-PDS in running fashion. Subcutaneous tissue was reapproximated with 3-0 plain in a running fashion. The skin was closed with a subcuticular stitch of 4-0 Vicryl on a Keith needle and then reinforced with Dermabond and a Honeycomb dressing. At the conclusion of the procedure all instruments and sponge counts were correct. Patient was taken to the recovery room in good condition with her baby accompanying her skin to skin.

## 2022-12-27 NOTE — Transfer of Care (Signed)
Immediate Anesthesia Transfer of Care Note  Patient: Lynn Roberts  Procedure(s) Performed: CESAREAN SECTION WITH BILATERAL TUBAL LIGATION (Bilateral)  Patient Location: PACU  Anesthesia Type:Spinal  Level of Consciousness: awake, alert , and oriented  Airway & Oxygen Therapy: Patient Spontanous Breathing  Post-op Assessment: Report given to RN and Post -op Vital signs reviewed and stable  Post vital signs: Reviewed and stable  Last Vitals:  Vitals Value Taken Time  BP 101/73 12/27/22 0915  Temp    Pulse 61 12/27/22 0918  Resp 22 12/27/22 0917  SpO2 99 % 12/27/22 0918  Vitals shown include unvalidated device data.  Last Pain:  Vitals:   12/27/22 0610  TempSrc: Oral  PainSc:          Complications: No notable events documented.

## 2022-12-28 LAB — CBC
HCT: 32.8 % — ABNORMAL LOW (ref 36.0–46.0)
Hemoglobin: 11 g/dL — ABNORMAL LOW (ref 12.0–15.0)
MCH: 27.6 pg (ref 26.0–34.0)
MCHC: 33.5 g/dL (ref 30.0–36.0)
MCV: 82.4 fL (ref 80.0–100.0)
Platelets: 229 10*3/uL (ref 150–400)
RBC: 3.98 MIL/uL (ref 3.87–5.11)
RDW: 14.1 % (ref 11.5–15.5)
WBC: 10.8 10*3/uL — ABNORMAL HIGH (ref 4.0–10.5)
nRBC: 0 % (ref 0.0–0.2)

## 2022-12-28 LAB — PROTEIN / CREATININE RATIO, URINE
Creatinine, Urine: 28 mg/dL
Total Protein, Urine: <6 mg/dL

## 2022-12-28 LAB — BIRTH TISSUE RECOVERY COLLECTION (PLACENTA DONATION)

## 2022-12-28 MED ORDER — FAMOTIDINE 20 MG PO TABS
20.0000 mg | ORAL_TABLET | Freq: Two times a day (BID) | ORAL | Status: DC | PRN
  Administered 2022-12-28: 20 mg via ORAL
  Filled 2022-12-28: qty 1

## 2022-12-28 MED ORDER — CALCIUM CARBONATE ANTACID 500 MG PO CHEW
2.0000 | CHEWABLE_TABLET | Freq: Four times a day (QID) | ORAL | Status: DC | PRN
  Administered 2022-12-28: 400 mg via ORAL
  Filled 2022-12-28: qty 2

## 2022-12-28 NOTE — Progress Notes (Signed)
Called Dr. Carlis Abbott about pt BP, Danae Chen answered and will relay message to Dr. Carlis Abbott to give call back.

## 2022-12-28 NOTE — Progress Notes (Signed)
BP elevated to 140-150/70-90 this afternoon after c-section.  Started on Procardia XL 30 mg, CMP normal, no proteinuria.  Will monitor

## 2022-12-28 NOTE — Progress Notes (Signed)
Patient is doing well.  She is tolerating PO, ambulating, voiding.  Pain is controlled.  Lochia is appropriate    BPs trended up yesterday afternoon after the c/s to [id number] systolic, so she was started on procardia XL '30mg'$  daily.  She feels well today  Vitals:   12/27/22 1727 12/27/22 2151 12/28/22 0216 12/28/22 0454  BP: (!) 150/75 132/81 113/74 117/74  Pulse: (!) 51 62 65 67  Resp: '14 18 16 16  '$ Temp: 97.7 F (36.5 C) 97.7 F (36.5 C) 97.9 F (36.6 C) 98.5 F (36.9 C)  TempSrc: Axillary Axillary Oral Oral  SpO2:  98% 100% 98%  Weight:      Height:        NAD Abdomen:  soft, appropriate tenderness, incisions intact and without erythema or drainage ext:    Symmetric, trace edema bilaterally  Lab Results  Component Value Date   WBC 10.8 (H) 12/28/2022   HGB 11.0 (L) 12/28/2022   HCT 32.8 (L) 12/28/2022   MCV 82.4 12/28/2022   PLT 229 12/28/2022    --/--/B POS (01/24 0914)  A/P    37 y.o. [id number] POD#1 s/p RCS at term Routine post op and postpartum care.   PP HTN: labs normal, controlled well currently on procardia XL '30mg'$  daily.  Will monitor  PPx:  SCDs and lovenox Anticipate discharge tomorrow

## 2022-12-28 NOTE — Progress Notes (Signed)
MOB was referred for history of depression/anxiety. * Referral screened out by Clinical Social Worker because none of the following criteria appear to apply: ~ History of anxiety/depression during this pregnancy, or of post-partum depression following prior delivery. ~ Diagnosis of anxiety and/or depression within last 3 years OR * MOB's symptoms currently being treated with medication and/or therapy. MOB is prescribed sertraline '50mg'$  daily. Please contact the Clinical Social Worker if needs arise, by [facility] request, or if MOB scores greater than 9/yes to question 10 on [place] Postpartum Depression Screen.  Signed,  Berniece Salines, MSW, LCSWA, LCASA 12/28/2022 8:18 AM

## 2022-12-29 MED ORDER — NIFEDIPINE ER 30 MG PO TB24: 30.0000 mg | ORAL_TABLET | Freq: Every day | ORAL | 0 refills | Status: DC

## 2022-12-29 MED ORDER — OXYCODONE HCL 5 MG PO TABS: 5.0000 mg | ORAL_TABLET | Freq: Four times a day (QID) | ORAL | 0 refills | Status: DC | PRN

## 2022-12-29 NOTE — Discharge Summary (Signed)
Postpartum Discharge Summary  Date of Service updated 12/29/22     Patient Name: Lynn Roberts DOB: Dec 16, 1985 MRN: [id number]  Date of admission: 12/27/2022 Delivery date:12/27/2022  Delivering provider: Deliah Boston  Date of discharge: 12/29/2022  Admitting diagnosis: History of cesarean section [Z98.891] Intrauterine pregnancy: [redacted]w[redacted]d    Secondary diagnosis:  Principal Problem:   History of cesarean section  Additional problems: postpartum hypertension    Discharge diagnosis: Term Pregnancy Delivered                                              Post partum procedures: n/a Augmentation: N/A Complications: None  Hospital course: Sceduled C/S   37y.o. yo [id number] admitted to the hospital 12/27/2022 for scheduled cesarean section with the following indication:Elective Repeat with right salpingectomy (prior left salpingectomy) .Delivery details are as follows:  Membrane Rupture Time/Date: 8:09 AM ,12/27/2022   Delivery Method:C-Section, Low Transverse  Details of operation can be found in separate operative note.  Patient had a postpartum course complicated by postpartum hypertension.  She was started on procardia XL '30mg'$  daily on POD#0 due to labile BPs.  On POD#1, BPs were mostly controlled in the 110s-130s/70-80s with the exception of a 155/104.  Decision was made not to increase dose of procardia due to concern for hypotension at increased dose.  She is ambulating, tolerating a regular diet, passing flatus, and urinating well. Patient is discharged home in stable condition on  12/29/22        Newborn Data: Birth date:12/27/2022  Birth time:8:10 AM  Gender:Female  Living status:Living  Apgars:8 ,9  Weight:2890 g       Physical exam  Vitals:   12/28/22 1905 12/28/22 2006 12/29/22 0128 12/29/22 0552  BP: (!) 155/104 126/79 130/83 121/81  Pulse:  71 64 60  Resp:  18  16  Temp:  98.1 F (36.7 C)  98.1 F (36.7 C)  TempSrc:  Oral  Oral  SpO2:       Weight:      Height:       General: alert, cooperative, and no distress Lochia: appropriate Uterine Fundus: firm Incision: Healing well with no significant drainage, Dressing is clean, dry, and intact DVT Evaluation: No evidence of DVT seen on physical exam. Labs: Lab Results  Component Value Date   WBC 10.8 (H) 12/28/2022   HGB 11.0 (L) 12/28/2022   HCT 32.8 (L) 12/28/2022   MCV 82.4 12/28/2022   PLT 229 12/28/2022      Latest Ref Rng & Units 12/27/2022   10:06 PM  CMP  Glucose 70 - 99 mg/dL 95   BUN 6 - 20 mg/dL 7   Creatinine 0.44 - 1.00 mg/dL 0.64   Sodium 135 - 145 mmol/L 137   Potassium 3.5 - 5.1 mmol/L 4.0   Chloride 98 - 111 mmol/L 109   CO2 22 - 32 mmol/L 21   Calcium 8.9 - 10.3 mg/dL 8.1   Total Protein 6.5 - 8.1 g/dL 5.1   Total Bilirubin 0.3 - 1.2 mg/dL 0.3   Alkaline Phos 38 - 126 U/L 69   AST 15 - 41 U/L 22   ALT 0 - 44 U/L 16    Edinburgh Score:    12/28/2022    2:03 AM  Edinburgh Postnatal Depression Scale Screening Tool  I have  been able to laugh and see the funny side of things. 0  I have looked forward with enjoyment to things. 0  I have blamed myself unnecessarily when things went wrong. 0  I have been anxious or worried for no good reason. 1  I have felt scared or panicky for no good reason. 0  Things have been getting on top of me. 1  I have been so unhappy that I have had difficulty sleeping. 0  I have felt sad or miserable. 0  I have been so unhappy that I have been crying. 0  The thought of harming myself has occurred to me. 0  Edinburgh Postnatal Depression Scale Total 2      After visit meds:  Allergies as of 12/29/2022       Reactions   Nsaids Other (See Comments)   HX. OF WEIGHT-LOSS SURGERY   Other Rash   Reaction to LOTIONS    Sulfa Antibiotics Rash   Tramadol Rash   Had after 2014 and tolerated okay        Medication List     STOP taking these medications    aspirin EC 81 MG tablet       TAKE these  medications    COPAXONE [place] Inject 40 mg into the skin 3 (three) times a week.   Copper Caps 2 MG Caps Generic drug: Copper Gluconate Take 2 mg by mouth daily.   cyanocobalamin 1000 MCG/ML injection Commonly known as: VITAMIN B12 INJECT 1 ML INTO THE SKIN EVERY 4 WEEKS   famotidine 20 MG tablet Commonly known as: PEPCID Take 1 tablet (20 mg total) by mouth 2 (two) times daily as needed for heartburn.   ferrous sulfate 325 (65 FE) MG tablet Take 325 mg by mouth daily with breakfast.   hydrocortisone 2.5 % rectal cream Commonly known as: ANUSOL-HC Place 1 Application rectally daily as needed for hemorrhoids or anal itching.   multivitamin with minerals Tabs tablet Take 1 tablet by mouth daily. What changed:  how much to take additional instructions   nicotine 7 mg/24hr patch Commonly known as: Nicoderm CQ Place 1 patch (7 mg total) onto the skin daily. What changed: how much to take   nicotine polacrilex 2 MG gum Commonly known as: NICORETTE TAKE 1 EACH (2 MG) BY MOUTH AS NEEDED FOR SMOKING CESSATION   NIFEdipine 30 MG 24 hr tablet Commonly known as: ADALAT CC Take 1 tablet (30 mg total) by mouth daily.   nystatin ointment Commonly known as: MYCOSTATIN Apply 1 Application topically daily as needed (Rash).   oxyCODONE 5 MG immediate release tablet Commonly known as: Oxy IR/ROXICODONE Take 1-2 tablets (5-10 mg total) by mouth every 6 (six) hours as needed for moderate pain.   Prenate Mini 18-0.6-0.4-350 MG Caps Take 1 capsule by mouth daily.   sertraline 50 MG tablet Commonly known as: ZOLOFT TAKE 1 TABLET BY MOUTH EVERY DAY   Vitamin D 125 MCG (5000 UT) Caps Take 10,000 Units by mouth daily.               Discharge Care Instructions  (From admission, onward)           Start     Ordered   12/29/22 0000  No dressing needed        12/29/22 0820             Discharge home in stable condition Infant Feeding: Bottle Infant  Disposition:home with mother Discharge instruction: per After Visit  Summary and Postpartum booklet. Activity: Advance as tolerated. Pelvic rest for 6 weeks.  Diet: routine diet Anticipated Birth Control: BTL done PP Postpartum Appointment:2 weeks Additional Postpartum F/U: BP check 1 week Future Appointments: Future Appointments  Date Time Provider [place]  05/28/2023  3:30 PM Sater, Nanine Means, MD [facility] None   Follow up Visit:  Follow-up Information     Shivaji, Melida Quitter, MD Follow up in 1 week(s).   Specialty: Obstetrics and Gynecology Why: BP check in 5-7 days and incision check in 2 weeks Contact information: [address] [id number] [phone]                     12/29/2022 [facility] Lars Masson, MD

## 2022-12-29 NOTE — Progress Notes (Signed)
Patient is doing well.  She is tolerating PO, ambulating, voiding.  Pain is controlled.  Lochia is appropriate    BPs were well controlled yesterday with the exception of one 155/104 with repeat normal in the 120s  Vitals:   12/28/22 1905 12/28/22 2006 12/29/22 0128 12/29/22 0552  BP: (!) 155/104 126/79 130/83 121/81  Pulse:  71 64 60  Resp:  18  16  Temp:  98.1 F (36.7 C)  98.1 F (36.7 C)  TempSrc:  Oral  Oral  SpO2:      Weight:      Height:        NAD Abdomen:  soft, appropriate tenderness, incisions intact and without erythema or drainage ext:    Symmetric, trace edema bilaterally  Lab Results  Component Value Date   WBC 10.8 (H) 12/28/2022   HGB 11.0 (L) 12/28/2022   HCT 32.8 (L) 12/28/2022   MCV 82.4 12/28/2022   PLT 229 12/28/2022    --/--/B POS (01/24 0914)  A/P    37 y.o. [id number] POD#2 s/p RCS at term Routine post op and postpartum care.   PP HTN: labs normal, overall well controlled currently on procardia XL '30mg'$  daily.  One elevated BP with repeat normal.  Do not feel that increasing dosing is warranted as baseline bps are otherwise normal  Will plan discharge to home with short interval f/u No NSAIDs due to gastric bypass

## 2022-12-29 NOTE — Discharge Instructions (Signed)
Please call if BP consistently > 140 /90

## 2022-12-29 NOTE — Plan of Care (Signed)
Adequate

## 2022-12-30 ENCOUNTER — Telehealth: Payer: Self-pay

## 2022-12-30 LAB — SURGICAL PATHOLOGY

## 2022-12-30 NOTE — Patient Outreach (Signed)
  Care Coordination TOC Note Transition Care Management Follow-up Telephone Call Date of discharge and from where: 12/29/22-[facility];"c-section, tubal ligation" How have you been since you were released from the hospital? Patient states she is "doing good." They went to see pediatrician today. 74 wgt  had dropped slightly.She is bottle feeding. She reports that she has pain but it is controlled/managed with regimen. She had a BM yesterday. Her appetite has been good. BP was slightly elevated while in the hospital. She was started on BP med and is taking daily. She has BP machine in the home to monitor BP and aware to alert MD of abnormal readings. BP at during nurse visit check today was 139/84. Any questions or concerns? No  Items Reviewed: Did the pt receive and understand the discharge instructions provided? Yes  Medications obtained and verified? Yes  Other? No  Any new allergies since your discharge? No  Dietary orders reviewed? Yes Do you have support at home? Yes   Home Care and Equipment/Supplies: Were home health services ordered? not applicable If so, what is the name of the agency? N/A  Has the agency set up a time to come to the patient's home? not applicable Were any new equipment or medical supplies ordered?  No What is the name of the medical supply agency? N/A Were you able to get the supplies/equipment? not applicable Do you have any questions related to the use of the equipment or supplies? No  Functional Questionnaire: (I = Independent and D = Dependent) ADLs: I  Bathing/Dressing- I  Meal Prep- I  Eating- I  Maintaining continence- I  Transferring/Ambulation- I  Managing Meds- I  Follow up appointments reviewed:  PCP Hospital f/u appt confirmed? No   Specialist Hospital f/u appt confirmed? Yes Patient went today for BP check and goes back for BP check on Thurs. She goes for incision care in one week and has 8 wk post-partum scheduled as  well. Are transportation arrangements needed? No  If their condition worsens, is the pt aware to call PCP or go to the Emergency Dept.? Yes Was the patient provided with contact information for the PCP's office or ED? Yes Was to pt encouraged to call back with questions or concerns? Yes  SDOH assessments and interventions completed:   Yes SDOH Interventions Today    Flowsheet Row Most Recent Value  SDOH Interventions   Food Insecurity Interventions Intervention Not Indicated  Transportation Interventions Intervention Not Indicated       Care Coordination Interventions:  Interventions Today    Flowsheet Row Most Recent Value  Chronic Disease Discussed/Reviewed   Chronic disease discussed/reviewed during today's visit Hypertension (HTN)  Nutrition Interventions   Nutrition Discussed/Reviewed Decreasing salt       TOC Interventions Today    Flowsheet Row Most Recent Value  TOC Interventions   TOC Interventions Discussed/Reviewed TOC Interventions Discussed, Post discharge activity limitations per provider, Post op wound/incision care, S/S of infection  [post op/post partum care, BP mgmt]          Encounter Outcome:  Pt. Visit Completed     Enzo Montgomery, RN,BSN,CCM [place] [facility] Telephonic Care Management Coordinator Direct Phone: [phone] Toll Free: [phone] Fax: [phone]

## 2022-12-31 ENCOUNTER — Encounter: Payer: Self-pay | Admitting: Hematology and Oncology

## 2022-12-31 ENCOUNTER — Telehealth: Payer: Self-pay | Admitting: Neurology

## 2022-12-31 NOTE — Telephone Encounter (Signed)
For your review in case you have anything more to add

## 2022-12-31 NOTE — Telephone Encounter (Signed)
Pt is calling. Stated she gave birth last Friday. Stated on Saturday she started with a bad headache and said they started her on blood pressure medication. Pt want to talk to nurse to see if this is a side effect of MS.

## 2022-12-31 NOTE — Telephone Encounter (Signed)
Please see message from today 12/31/2022

## 2023-01-01 ENCOUNTER — Encounter: Payer: Self-pay | Admitting: Hematology and Oncology

## 2023-01-02 DIAGNOSIS — R300 Dysuria: Secondary | ICD-10-CM | POA: Diagnosis not present

## 2023-01-06 ENCOUNTER — Telehealth (HOSPITAL_COMMUNITY): Payer: Self-pay | Admitting: *Deleted

## 2023-01-06 NOTE — Telephone Encounter (Signed)
Voice mailbox is full. Unable to leave message.  Odis Hollingshead, RN 01-06-2023 at 3:34pm

## 2023-01-08 NOTE — Telephone Encounter (Signed)
I called [id number]. I spoke with Salomon Fick. The RX for zeposia starter kit will be coming from [facility] and the maintenance dose will be coming from [facility]. [facility] already has the starter kit.  I called [facility] and provided the VO for Zeposia 0.'92mg'$  QD.

## 2023-01-13 DIAGNOSIS — Z9889 Other specified postprocedural states: Secondary | ICD-10-CM | POA: Diagnosis not present

## 2023-01-13 DIAGNOSIS — Z09 Encounter for follow-up examination after completed treatment for conditions other than malignant neoplasm: Secondary | ICD-10-CM | POA: Diagnosis not present

## 2023-01-13 DIAGNOSIS — I10 Essential (primary) hypertension: Secondary | ICD-10-CM | POA: Diagnosis not present

## 2023-01-14 ENCOUNTER — Other Ambulatory Visit: Payer: Self-pay | Admitting: Cardiovascular Disease

## 2023-01-22 DIAGNOSIS — K603 Anal fistula: Secondary | ICD-10-CM | POA: Diagnosis not present

## 2023-01-23 ENCOUNTER — Encounter: Payer: Self-pay | Admitting: Hematology and Oncology

## 2023-01-23 NOTE — Progress Notes (Signed)
Surgery orders requested via Epic inbox. °

## 2023-01-24 ENCOUNTER — Ambulatory Visit: Payer: Self-pay | Admitting: Surgery

## 2023-01-26 NOTE — Patient Instructions (Signed)
SURGICAL WAITING ROOM VISITATION Patients having surgery or a procedure may have no more than 2 support people in the waiting area - these visitors may rotate in the visitor waiting room.   Due to an increase in RSV and influenza rates and associated hospitalizations, children ages 41 and under may not visit patients in [place]. If the patient needs to stay at the hospital during part of their recovery, the visitor guidelines for inpatient rooms apply.  PRE-OP VISITATION  Pre-op nurse will coordinate an appropriate time for 1 support person to accompany the patient in pre-op.  This support person may not rotate.  This visitor will be contacted when the time is appropriate for the visitor to come back in the pre-op area.  Please refer to the [facility] website for the visitor guidelines for Inpatients (after your surgery is over and you are in a regular room).  You are not required to quarantine at this time prior to your surgery. However, you must do this: Hand Hygiene often Do NOT share personal items Notify your provider if you are in close contact with someone who has COVID or you develop fever 100.4 or greater, new onset of sneezing, cough, sore throat, shortness of breath or body aches.  If you test positive for Covid or have been in contact with anyone that has tested positive in the last 10 days please notify you surgeon.    Your procedure is scheduled on: Monday  February 10, 2023   Report to [facility] Main Entrance: [place] entrance where the [employer] is available.   Report to admitting at: 07:45    AM  +++++Call this number if you have any questions or problems the morning of surgery [phone]  DO NOT EAT OR DRINK ANYTHING AFTER MIDNIGHT THE NIGHT PRIOR TO YOUR SURGERY / PROCEDURE.   FOLLOW BOWEL PREP AND ANY ADDITIONAL PRE OP INSTRUCTIONS YOU RECEIVED FROM YOUR SURGEON'S OFFICE!!!  Fleet enema-  use according to directions the morning of your  surgery.    Oral Hygiene is also important to reduce your risk of infection.        Remember - BRUSH YOUR TEETH THE MORNING OF SURGERY WITH YOUR REGULAR TOOTHPASTE  Do NOT smoke after Midnight the night before surgery.  Take ONLY these medicines the morning of surgery with A SIP OF WATER: sertraline (Zoloft), nifedipine (Procardia), Zeposia ??   If You have been diagnosed with Sleep Apnea - Bring CPAP mask and tubing day of surgery. We will provide you with a CPAP machine on the day of your surgery.                   You may not have any metal on your body including hair pins, jewelry, and body piercing  Do not wear make-up, lotions, powders, perfumes or deodorant  Do not wear nail polish including gel and S&S, artificial / acrylic nails, or any other type of covering on natural nails including finger and toenails. If you have artificial nails, gel coating, etc., that needs to be removed by a nail salon, Please have this removed prior to surgery. Not doing so may mean that your surgery could be cancelled or delayed if the Surgeon or anesthesia staff feels like they are unable to monitor you safely.   Do not shave 48 hours prior to surgery to avoid nicks in your skin which may contribute to postoperative infections.   Contacts, Hearing Aids, dentures or bridgework may not  be worn into surgery. DENTURES WILL BE REMOVED PRIOR TO SURGERY PLEASE DO NOT APPLY "Poly grip" OR ADHESIVES!!!  Patients discharged on the day of surgery will not be allowed to drive home.  Someone NEEDS to stay with you for the first 24 hours after anesthesia.  Do not bring your home medications to the hospital. The Pharmacy will dispense medications listed on your medication list to you during your admission in the Hospital.  Special Instructions: Bring a copy of your healthcare power of attorney and living will documents the day of surgery, if you wish to have them scanned into your [place] Records-  EPIC  Please read over the following fact sheets you were given: IF YOU HAVE QUESTIONS ABOUT YOUR PRE-OP INSTRUCTIONS, PLEASE CALL [phone]  ([place])   [place] - Preparing for Surgery Before surgery, you can play an important role.  Because skin is not sterile, your skin needs to be as free of germs as possible.  You can reduce the number of germs on your skin by washing with CHG (chlorahexidine gluconate) soap before surgery.  CHG is an antiseptic cleaner which kills germs and bonds with the skin to continue killing germs even after washing. Please DO NOT use if you have an allergy to CHG or antibacterial soaps.  If your skin becomes reddened/irritated stop using the CHG and inform your nurse when you arrive at Short Stay. Do not shave (including legs and underarms) for at least 48 hours prior to the first CHG shower.  You may shave your face/neck.  Please follow these instructions carefully:  1.  Shower with CHG Soap the night before surgery and the  morning of surgery.  2.  If you choose to wash your hair, wash your hair first as usual with your normal  shampoo.  3.  After you shampoo, rinse your hair and body thoroughly to remove the shampoo.                             4.  Use CHG as you would any other liquid soap.  You can apply chg directly to the skin and wash.  Gently with a scrungie or clean washcloth.  5.  Apply the CHG Soap to your body ONLY FROM THE NECK DOWN.   Do not use on face/ open                           Wound or open sores. Avoid contact with eyes, ears mouth and genitals (private parts).                       Wash face,  Genitals (private parts) with your normal soap.             6.  Wash thoroughly, paying special attention to the area where your  surgery  will be performed.  7.  Thoroughly rinse your body with warm water from the neck down.  8.  DO NOT shower/wash with your normal soap after using and rinsing off the CHG Soap.            9.  Pat yourself dry with a  clean towel.            10.  Wear clean pajamas.            11.  Place clean sheets on your bed the night of  your first shower and do not  sleep with pets.  ON THE DAY OF SURGERY : Do not apply any lotions/deodorants the morning of surgery.  Please wear clean clothes to the hospital/surgery center.    FAILURE TO FOLLOW THESE INSTRUCTIONS MAY RESULT IN THE CANCELLATION OF YOUR SURGERY  PATIENT SIGNATURE_________________________________  NURSE SIGNATURE__________________________________  ________________________________________________________________________

## 2023-01-26 NOTE — Progress Notes (Signed)
COVID Vaccine received:  '[]'$  No '[x]'$  Yes Date of any COVID positive Test in last 90 days:  None  PCP -none now Cardiologist - Eleonore Chiquito, MD (only saw during recent pregnancy) Neurology- Dr. Arlice Colt   Chest x-ray -  EKG -06-20-2022  Epic   Stress Test -  ECHO - 10-21-2019  Epic Cardiac Cath -  Long term Monitor_ 06-20-2022  Epic  PCR screen: '[]'$  Ordered & Completed                      '[]'$   No Order but Needs PROFEND                      '[x]'$   N/A for this surgery  Surgery Plan:  '[x]'$  Ambulatory                            '[]'$  Outpatient in bed                            '[]'$  Admit  Anesthesia:    '[x]'$  General  '[]'$  Spinal                           '[]'$   Choice '[]'$   MAC  Bowel Prep - '[]'$  No  '[x]'$   Yes _Fleet enema_morning of surgery_  Pacemaker / ICD device '[x]'$  No '[]'$  Yes        Device order form faxed '[x]'$  No    '[]'$   Yes      Faxed to:  Spinal Cord Stimulator:'[x]'$  No '[]'$  Yes      (Remind patient to bring remote DOS) Other Implants:   History of Sleep Apnea? '[]'$  No '[x]'$  Yes   CPAP used?- '[x]'$  No '[]'$  Yes  Bariatric surgery lost over 400 lbs  Does the patient monitor blood sugar? '[]'$  No '[]'$  Yes  '[x]'$  N/A   No DM  Blood Thinner / Instructions:  None Aspirin Instructions:None  ERAS Protocol Ordered: '[x]'$  No  '[]'$  Yes Patient is to be NPO after: Midnight prior  Comments: Just delivered a baby girl Pincus Sanes) on 12-27-2022.  Activity level: Patient can climb a flight of stairs without difficulty; '[x]'$  No CP  '[x]'$  No SOB.  Patient can perform ADLs without assistance.   Anesthesia review: Hx PATS- ablation at age 89, Heart murmur- last ECHO 06-20-2022, Has MS, OSA - no CPAP since bariatric surgery lost 400+ lbs, Patient is of [name] descent, Migraines, Anemia, smoker  Patient denies shortness of breath, fever, cough and chest pain at PAT appointment.  Patient verbalized understanding and agreement to the Pre-Surgical Instructions that were given to them at this PAT appointment. Patient was  also educated of the need to review these PAT instructions again prior to his/her surgery.I reviewed the appropriate phone numbers to call if they have any and questions or concerns.

## 2023-01-27 ENCOUNTER — Encounter (HOSPITAL_COMMUNITY): Payer: Self-pay

## 2023-01-27 ENCOUNTER — Encounter (HOSPITAL_COMMUNITY)
Admission: RE | Admit: 2023-01-27 | Discharge: 2023-01-27 | Disposition: A | Discharge status: Home / Self Care | Payer: 59 | Source: Ambulatory Visit | Attending: Surgery | Admitting: Surgery

## 2023-01-27 ENCOUNTER — Other Ambulatory Visit: Payer: Self-pay

## 2023-01-27 ENCOUNTER — Encounter: Payer: Self-pay | Admitting: Hematology and Oncology

## 2023-01-27 VITALS — BP 118/75 | HR 63 | Temp 97.9°F | Resp 20 | Ht 71.0 in | Wt 275.0 lb

## 2023-01-27 DIAGNOSIS — I2510 Atherosclerotic heart disease of native coronary artery without angina pectoris: Secondary | ICD-10-CM

## 2023-01-27 DIAGNOSIS — Z01818 Encounter for other preprocedural examination: Secondary | ICD-10-CM

## 2023-01-27 DIAGNOSIS — Z01812 Encounter for preprocedural laboratory examination: Secondary | ICD-10-CM | POA: Insufficient documentation

## 2023-01-27 LAB — CBC
HCT: 40.2 % (ref 36.0–46.0)
Hemoglobin: 12.2 g/dL (ref 12.0–15.0)
MCH: 26.2 pg (ref 26.0–34.0)
MCHC: 30.3 g/dL (ref 30.0–36.0)
MCV: 86.5 fL (ref 80.0–100.0)
Platelets: 262 10*3/uL (ref 150–400)
RBC: 4.65 MIL/uL (ref 3.87–5.11)
RDW: 15.5 % (ref 11.5–15.5)
WBC: 5.7 10*3/uL (ref 4.0–10.5)
nRBC: 0 % (ref 0.0–0.2)

## 2023-01-27 LAB — BASIC METABOLIC PANEL
Anion gap: 6 (ref 5–15)
BUN: 7 mg/dL (ref 6–20)
CO2: 25 mmol/L (ref 22–32)
Calcium: 8.3 mg/dL — ABNORMAL LOW (ref 8.9–10.3)
Chloride: 108 mmol/L (ref 98–111)
Creatinine, Ser: 0.59 mg/dL (ref 0.44–1.00)
GFR, Estimated: >60 mL/min (ref >60)
Glucose, Bld: 71 mg/dL (ref 70–99)
Potassium: 3.6 mmol/L (ref 3.5–5.1)
Sodium: 139 mmol/L (ref 135–145)

## 2023-01-28 ENCOUNTER — Encounter: Payer: Self-pay | Admitting: Hematology and Oncology

## 2023-01-28 ENCOUNTER — Telehealth: Payer: Self-pay | Admitting: Neurology

## 2023-01-28 NOTE — Telephone Encounter (Signed)
sent to GI they obtain Holli Humbles, [employer] and healthy blue is showing inactive online. [id number]

## 2023-01-29 ENCOUNTER — Encounter: Payer: Self-pay | Admitting: Neurology

## 2023-01-29 ENCOUNTER — Other Ambulatory Visit: Payer: Self-pay | Admitting: *Deleted

## 2023-01-29 MED ORDER — ALPRAZOLAM 0.5 MG PO TABS: ORAL_TABLET | ORAL | 0 refills | Status: DC

## 2023-02-05 ENCOUNTER — Other Ambulatory Visit: Payer: Self-pay | Admitting: Neurology

## 2023-02-05 MED ORDER — OXYBUTYNIN CHLORIDE 5 MG PO TABS: 5.0000 mg | ORAL_TABLET | Freq: Two times a day (BID) | ORAL | 3 refills | Status: DC

## 2023-02-10 ENCOUNTER — Other Ambulatory Visit: Payer: Self-pay

## 2023-02-10 ENCOUNTER — Encounter (HOSPITAL_COMMUNITY): Payer: Self-pay | Admitting: Surgery

## 2023-02-10 ENCOUNTER — Ambulatory Visit (HOSPITAL_COMMUNITY): Payer: 59 | Admitting: Physician Assistant

## 2023-02-10 ENCOUNTER — Encounter: Payer: Self-pay | Admitting: Neurology

## 2023-02-10 ENCOUNTER — Ambulatory Visit (HOSPITAL_COMMUNITY)
Admission: RE | Admit: 2023-02-10 | Discharge: 2023-02-10 | Disposition: A | Discharge status: Home / Self Care | Payer: 59 | Source: Ambulatory Visit | Attending: Surgery | Admitting: Surgery

## 2023-02-10 ENCOUNTER — Ambulatory Visit (HOSPITAL_COMMUNITY): Payer: 59 | Admitting: Anesthesiology

## 2023-02-10 ENCOUNTER — Encounter (HOSPITAL_COMMUNITY)
Admission: RE | Disposition: A | Discharge status: Home / Self Care | Payer: Self-pay | Source: Ambulatory Visit | Attending: Surgery

## 2023-02-10 DIAGNOSIS — F17210 Nicotine dependence, cigarettes, uncomplicated: Secondary | ICD-10-CM | POA: Insufficient documentation

## 2023-02-10 DIAGNOSIS — E669 Obesity, unspecified: Secondary | ICD-10-CM | POA: Diagnosis not present

## 2023-02-10 DIAGNOSIS — Z01818 Encounter for other preprocedural examination: Secondary | ICD-10-CM

## 2023-02-10 DIAGNOSIS — Z6838 Body mass index (BMI) 38.0-38.9, adult: Secondary | ICD-10-CM | POA: Diagnosis not present

## 2023-02-10 DIAGNOSIS — I2510 Atherosclerotic heart disease of native coronary artery without angina pectoris: Secondary | ICD-10-CM

## 2023-02-10 DIAGNOSIS — K648 Other hemorrhoids: Secondary | ICD-10-CM | POA: Insufficient documentation

## 2023-02-10 DIAGNOSIS — G4730 Sleep apnea, unspecified: Secondary | ICD-10-CM

## 2023-02-10 DIAGNOSIS — K603 Anal fistula: Secondary | ICD-10-CM | POA: Insufficient documentation

## 2023-02-10 DIAGNOSIS — M1990 Unspecified osteoarthritis, unspecified site: Secondary | ICD-10-CM | POA: Insufficient documentation

## 2023-02-10 DIAGNOSIS — G35 Multiple sclerosis: Secondary | ICD-10-CM | POA: Insufficient documentation

## 2023-02-10 HISTORY — PX: EVALUATION UNDER ANESTHESIA WITH ANAL FISTULECTOMY: SHX5621

## 2023-02-10 LAB — POCT PREGNANCY, URINE: Preg Test, Ur: NEGATIVE

## 2023-02-10 SURGERY — EXAM UNDER ANESTHESIA WITH ANAL FISTULECTOMY
Anesthesia: General

## 2023-02-10 MED ORDER — CHLORHEXIDINE GLUCONATE CLOTH 2 % EX PADS: 6.0000 | MEDICATED_PAD | Freq: Once | CUTANEOUS | Status: DC

## 2023-02-10 MED ORDER — MIDAZOLAM HCL 2 MG/2ML IJ SOLN
INTRAMUSCULAR | Status: AC
  Filled 2023-02-10: qty 2

## 2023-02-10 MED ORDER — SODIUM CHLORIDE 0.9 % IV SOLN
2.0000 g | INTRAVENOUS | Status: AC
  Administered 2023-02-10: 2 g via INTRAVENOUS
  Filled 2023-02-10: qty 20

## 2023-02-10 MED ORDER — ACETAMINOPHEN 500 MG PO TABS
1000.0000 mg | ORAL_TABLET | ORAL | Status: AC
  Administered 2023-02-10: 1000 mg via ORAL
  Filled 2023-02-10: qty 2

## 2023-02-10 MED ORDER — FLEET ENEMA 7-19 GM/118ML RE ENEM
1.0000 | ENEMA | Freq: Once | RECTAL | Status: DC
  Filled 2023-02-10: qty 1

## 2023-02-10 MED ORDER — SODIUM CHLORIDE (PF) 0.9 % IJ SOLN
INTRAVENOUS | Status: DC | PRN
  Administered 2023-02-10: 3 mL

## 2023-02-10 MED ORDER — ORAL CARE MOUTH RINSE: 15.0000 mL | Freq: Once | OROMUCOSAL | Status: AC

## 2023-02-10 MED ORDER — ROCURONIUM BROMIDE 100 MG/10ML IV SOLN
INTRAVENOUS | Status: DC | PRN
  Administered 2023-02-10: 40 mg via INTRAVENOUS

## 2023-02-10 MED ORDER — CHLORHEXIDINE GLUCONATE 0.12 % MT SOLN
15.0000 mL | Freq: Once | OROMUCOSAL | Status: AC
  Administered 2023-02-10: 15 mL via OROMUCOSAL

## 2023-02-10 MED ORDER — FENTANYL CITRATE PF 50 MCG/ML IJ SOSY: 25.0000 ug | PREFILLED_SYRINGE | INTRAMUSCULAR | Status: DC | PRN

## 2023-02-10 MED ORDER — METRONIDAZOLE 500 MG/100ML IV SOLN
500.0000 mg | INTRAVENOUS | Status: AC
  Administered 2023-02-10: 500 mg via INTRAVENOUS
  Filled 2023-02-10: qty 100

## 2023-02-10 MED ORDER — OXYCODONE HCL 5 MG/5ML PO SOLN: 5.0000 mg | Freq: Once | ORAL | Status: AC | PRN

## 2023-02-10 MED ORDER — PROPOFOL 10 MG/ML IV BOLUS
INTRAVENOUS | Status: AC
  Filled 2023-02-10: qty 20

## 2023-02-10 MED ORDER — OXYCODONE HCL 5 MG PO TABS
5.0000 mg | ORAL_TABLET | Freq: Once | ORAL | Status: AC | PRN
  Administered 2023-02-10: 5 mg via ORAL

## 2023-02-10 MED ORDER — DIBUCAINE (PERIANAL) 1 % EX OINT
TOPICAL_OINTMENT | CUTANEOUS | Status: AC
  Filled 2023-02-10: qty 28

## 2023-02-10 MED ORDER — BUPIVACAINE-EPINEPHRINE (PF) 0.25% -1:200000 IJ SOLN
INTRAMUSCULAR | Status: DC | PRN
  Administered 2023-02-10: 46 mL

## 2023-02-10 MED ORDER — BUPIVACAINE-EPINEPHRINE (PF) 0.25% -1:200000 IJ SOLN
INTRAMUSCULAR | Status: AC
  Filled 2023-02-10: qty 30

## 2023-02-10 MED ORDER — MIDAZOLAM HCL 5 MG/5ML IJ SOLN
INTRAMUSCULAR | Status: DC | PRN
  Administered 2023-02-10: 2 mg via INTRAVENOUS

## 2023-02-10 MED ORDER — FENTANYL CITRATE (PF) 100 MCG/2ML IJ SOLN
INTRAMUSCULAR | Status: DC | PRN
  Administered 2023-02-10 (×2): 50 ug via INTRAVENOUS
  Administered 2023-02-10: 150 ug via INTRAVENOUS

## 2023-02-10 MED ORDER — METHYLENE BLUE 1 % INJ SOLN
INTRAVENOUS | Status: AC
  Filled 2023-02-10: qty 10

## 2023-02-10 MED ORDER — DEXAMETHASONE SODIUM PHOSPHATE 4 MG/ML IJ SOLN
INTRAMUSCULAR | Status: DC | PRN
  Administered 2023-02-10: 5 mg via INTRAVENOUS

## 2023-02-10 MED ORDER — BUPIVACAINE LIPOSOME 1.3 % IJ SUSP
INTRAMUSCULAR | Status: AC
  Filled 2023-02-10: qty 20

## 2023-02-10 MED ORDER — BUPIVACAINE LIPOSOME 1.3 % IJ SUSP: 20.0000 mL | Freq: Once | INTRAMUSCULAR | Status: DC

## 2023-02-10 MED ORDER — ONDANSETRON HCL 4 MG/2ML IJ SOLN
INTRAMUSCULAR | Status: DC | PRN
  Administered 2023-02-10: 4 mg via INTRAVENOUS

## 2023-02-10 MED ORDER — LIDOCAINE HCL (CARDIAC) PF 100 MG/5ML IV SOSY
PREFILLED_SYRINGE | INTRAVENOUS | Status: DC | PRN
  Administered 2023-02-10: 60 mg via INTRAVENOUS

## 2023-02-10 MED ORDER — ONDANSETRON HCL 4 MG/2ML IJ SOLN
INTRAMUSCULAR | Status: AC
  Filled 2023-02-10: qty 2

## 2023-02-10 MED ORDER — OXYCODONE HCL 5 MG PO TABS
ORAL_TABLET | ORAL | Status: AC
  Filled 2023-02-10: qty 1

## 2023-02-10 MED ORDER — AMISULPRIDE (ANTIEMETIC) 5 MG/2ML IV SOLN: 10.0000 mg | Freq: Once | INTRAVENOUS | Status: DC | PRN

## 2023-02-10 MED ORDER — PROMETHAZINE HCL 25 MG/ML IJ SOLN: 6.2500 mg | INTRAMUSCULAR | Status: DC | PRN

## 2023-02-10 MED ORDER — SUGAMMADEX SODIUM 200 MG/2ML IV SOLN
INTRAVENOUS | Status: DC | PRN
  Administered 2023-02-10: 200 mg via INTRAVENOUS

## 2023-02-10 MED ORDER — LACTATED RINGERS IV SOLN: INTRAVENOUS | Status: DC

## 2023-02-10 MED ORDER — PROPOFOL 10 MG/ML IV BOLUS
INTRAVENOUS | Status: DC | PRN
  Administered 2023-02-10: 100 mg via INTRAVENOUS
  Administered 2023-02-10: 200 mg via INTRAVENOUS

## 2023-02-10 MED ORDER — DEXAMETHASONE SODIUM PHOSPHATE 10 MG/ML IJ SOLN
INTRAMUSCULAR | Status: AC
  Filled 2023-02-10: qty 1

## 2023-02-10 MED ORDER — FENTANYL CITRATE (PF) 250 MCG/5ML IJ SOLN
INTRAMUSCULAR | Status: AC
  Filled 2023-02-10: qty 5

## 2023-02-10 MED ORDER — OXYCODONE HCL 5 MG PO TABS: 5.0000 mg | ORAL_TABLET | Freq: Four times a day (QID) | ORAL | 0 refills | Status: AC | PRN

## 2023-02-10 SURGICAL SUPPLY — 44 items
APL SKNCLS STERI-STRIP NONHPOA (GAUZE/BANDAGES/DRESSINGS) ×1
BAG COUNTER SPONGE SURGICOUNT (BAG) IMPLANT
BAG SPNG CNTER NS LX DISP (BAG)
BENZOIN TINCTURE PRP APPL 2/3 (GAUZE/BANDAGES/DRESSINGS) ×1 IMPLANT
BLADE SURG 15 STRL LF DISP TIS (BLADE) IMPLANT
BLADE SURG 15 STRL SS (BLADE)
BRIEF MESH DISP LRG (UNDERPADS AND DIAPERS) ×1 IMPLANT
CNTNR URN SCR LID CUP LEK RST (MISCELLANEOUS) ×1 IMPLANT
CONT SPEC 4OZ STRL OR WHT (MISCELLANEOUS) ×1
COVER SURGICAL LIGHT HANDLE (MISCELLANEOUS) ×1 IMPLANT
DRAPE LAPAROTOMY T 102X78X121 (DRAPES) ×1 IMPLANT
ELECT NDL BLADE 2-5/6 (NEEDLE) ×1 IMPLANT
ELECT NEEDLE BLADE 2-5/6 (NEEDLE) ×1 IMPLANT
ELECT REM PT RETURN 15FT ADLT (MISCELLANEOUS) ×1 IMPLANT
GAUZE 4X4 16PLY ~~LOC~~+RFID DBL (SPONGE) ×1 IMPLANT
GAUZE PAD ABD 8X10 STRL (GAUZE/BANDAGES/DRESSINGS) IMPLANT
GAUZE SPONGE 4X4 12PLY STRL (GAUZE/BANDAGES/DRESSINGS) IMPLANT
GLOVE BIO SURGEON STRL SZ7.5 (GLOVE) ×1 IMPLANT
GLOVE INDICATOR 8.0 STRL GRN (GLOVE) ×1 IMPLANT
GOWN STRL REUS W/ TWL XL LVL3 (GOWN DISPOSABLE) ×2 IMPLANT
GOWN STRL REUS W/TWL XL LVL3 (GOWN DISPOSABLE) ×2
KIT BASIN OR (CUSTOM PROCEDURE TRAY) ×1 IMPLANT
KIT TURNOVER KIT A (KITS) IMPLANT
LOOP VESSEL MAXI BLUE (MISCELLANEOUS) IMPLANT
NDL HYPO 22X1.5 SAFETY MO (MISCELLANEOUS) ×1 IMPLANT
NEEDLE HYPO 22X1.5 SAFETY MO (MISCELLANEOUS) ×1 IMPLANT
NEEDLE SAFETY HYPO 22GAX1.5 (MISCELLANEOUS) ×1
PACK BASIC VI WITH GOWN DISP (CUSTOM PROCEDURE TRAY) ×1 IMPLANT
PACK GENERAL/GYN (CUSTOM PROCEDURE TRAY) IMPLANT
PENCIL SMOKE EVACUATOR (MISCELLANEOUS) IMPLANT
SHEARS HARMONIC 9CM CVD (BLADE) IMPLANT
SPIKE FLUID TRANSFER (MISCELLANEOUS) ×1 IMPLANT
SURGILUBE 2OZ TUBE FLIPTOP (MISCELLANEOUS) ×1 IMPLANT
SUT CHROMIC 2 0 SH (SUTURE) ×1 IMPLANT
SUT CHROMIC 3 0 SH 27 (SUTURE) IMPLANT
SUT SILK 2 0 (SUTURE) ×1
SUT SILK 2-0 30XBRD TIE 12 (SUTURE) IMPLANT
SUT VIC AB 2-0 SH 27 (SUTURE)
SUT VIC AB 2-0 SH 27X BRD (SUTURE) IMPLANT
SUT VIC AB 2-0 UR6 27 (SUTURE) ×6 IMPLANT
SYR 20ML LL LF (SYRINGE) ×1 IMPLANT
SYR 3ML LL SCALE MARK (SYRINGE) IMPLANT
TOWEL OR 17X26 10 PK STRL BLUE (TOWEL DISPOSABLE) ×1 IMPLANT
TOWEL OR NON WOVEN STRL DISP B (DISPOSABLE) ×1 IMPLANT

## 2023-02-10 NOTE — Op Note (Addendum)
02/10/2023  10:24 AM  PATIENT:  Lynn Roberts  37 y.o. female  Patient Care Team: Patient, No Pcp Per as PCP - General (General Practice) O'Neal, Cassie Freer, MD as PCP - Cardiology (Cardiology) Shivaji, Melida Quitter, MD as PCP - OBGYN (Obstetrics and Gynecology)  PRE-OPERATIVE DIAGNOSIS:  Recurrent anal fistula  POST-OPERATIVE DIAGNOSIS:  Recurrent transsphincteric anal fistula  PROCEDURE:   Surgical control/treatment of transsphincteric anal fistula with placement of draining seton Anorectal exam under anesthesia  SURGEON:  Surgeon(s): Ileana Roup, MD  ASSISTANT: OR staff   ANESTHESIA:   local and general  SPECIMEN:  No Specimen  DISPOSITION OF SPECIMEN:  N/A  COUNTS:  Sponge, needle, and instrument counts were reported correct x2 at conclusion.  EBL: 2 mL  Drains: Blue vessel loop draining seton  PLAN OF CARE: Discharge to home after PACU  PATIENT DISPOSITION:  PACU - hemodynamically stable.  OR FINDINGS: Right ischiorectal scar with thin skin but no perianal abscesses. Internal hemorrhoids.  Transsphincteric anal fistula readily identified with an internal opening in the posterior midline, external opening in the right posterior position.  This was then controlled with a blue vessel loop draining seton.  DESCRIPTION: The patient was identified in the preoperative holding area and taken to the OR. SCDs were applied. She then underwent general endotracheal anesthesia without difficulty. The patient was then rolled onto the OR table in the prone jackknife position. Pressure points were then evaluated and padded. Benzoin was applied to the buttocks and they were gently taped apart.  She was then prepped and draped in usual sterile fashion.  A surgical timeout was performed indicating the correct patient, procedure, and positioning.  A perianal block was then created using a dilute mixture of 0.25% Marcaine with epinephrine and Exparel.  After ascertaining an  appropriate level of anesthesia had been achieved, a well lubricated digital rectal exam was performed. This demonstrated no palpable abnormalities.  A Hill-Ferguson anoscope was into the anal canal and circumferential inspection demonstrated granulation tissue in the posterior midline consistent with a probable internal opening.  Small internal hemorrhoids.  Thinning of the skin on the right ischiorectal fossa without evident abscess.  The left side appears well-healed.  We therefore opted to start with the right side.  A small skin incision was created.  We readily gained access to a chronic type wound.  A flexible fistula probe was then gently passed and easily communicated with the posterior midline anal canal just distal to the dentate line.  Overlying sphincter is palpated this is found to be a transsphincteric type configuration.  There is some communication towards the left ischiorectal fossa but we felt that by controlling the right side, ideally, the left side will also eventually collapse/heal.  She has no left ischiorectal fossa abscesses or any active drainage/wounds.  Our fistula probe was then exchanged for a blue vessel loop seton which was left in and draining configuration.  This is secured to itself with 2-0 silk suture.  Additional local anesthetic is infiltrated.  All sponge, needle, instrument counts were reported correct.  The wounds were washed and dried.  A dressing consisting of 4 x 4's, ABD, and mesh underwear was placed.  The buttocks are untaped and she was rolled back onto a stretcher, wake from anesthesia, extubated, and transported to recovery in satisfactory condition.  DISPOSITION: PACU in satisfactory condition.

## 2023-02-10 NOTE — Transfer of Care (Signed)
Immediate Anesthesia Transfer of Care Note  Patient: Lynn Roberts  Procedure(s) Performed: CONTROL OF ANAL FISTULAS WITH PLACEMENT OF SETON AND ANORECTAL EXAM UNDER ANESTHESIA  Patient Location: PACU  Anesthesia Type:General  Level of Consciousness: awake and alert   Airway & Oxygen Therapy: Patient Spontanous Breathing and Patient connected to face mask oxygen  Post-op Assessment: Report given to RN and Post -op Vital signs reviewed and stable  Post vital signs: Reviewed and stable  Last Vitals:  Vitals Value Taken Time  BP 117/69 02/10/23 1020  Temp    Pulse 79 02/10/23 1022  Resp 18 02/10/23 1022  SpO2 95 % 02/10/23 1022  Vitals shown include unvalidated device data.  Last Pain:  Vitals:   02/10/23 0755  TempSrc: Oral         Complications: No notable events documented.

## 2023-02-10 NOTE — Anesthesia Postprocedure Evaluation (Signed)
Anesthesia Post Note  Patient: Lynn Roberts  Procedure(s) Performed: CONTROL OF ANAL FISTULAS WITH PLACEMENT OF SETON AND ANORECTAL EXAM UNDER ANESTHESIA     Patient location during evaluation: PACU Anesthesia Type: General Level of consciousness: awake Pain management: pain level controlled Vital Signs Assessment: post-procedure vital signs reviewed and stable Respiratory status: spontaneous breathing, nonlabored ventilation and respiratory function stable Cardiovascular status: blood pressure returned to baseline and stable Postop Assessment: no apparent nausea or vomiting Anesthetic complications: no   No notable events documented.  Last Vitals:  Vitals:   02/10/23 1100 02/10/23 1115  BP: 121/75 124/77  Pulse: 81 74  Resp: 15 20  Temp:  (!) 36.4 C  SpO2: 94% 98%    Last Pain:  Vitals:   02/10/23 1115  TempSrc:   PainSc: 5                  Rolly Magri P Ronell Duffus

## 2023-02-10 NOTE — Telephone Encounter (Signed)
From GI: Hey, we are coming up OON with pt ins on [employer] so in will not approve because of this. This patient orders will been to be sent to another facility that is in network with insurance. thank you   Lynn Roberts had [facility]

## 2023-02-10 NOTE — Anesthesia Procedure Notes (Signed)
Procedure Name: Intubation Date/Time: 02/10/2023 9:27 AM  Performed by: Victoriano Lain, CRNAPre-anesthesia Checklist: Patient identified, Emergency Drugs available, Suction available, Patient being monitored and Timeout performed Patient Re-evaluated:Patient Re-evaluated prior to induction Oxygen Delivery Method: Circle system utilized Preoxygenation: Pre-oxygenation with 100% oxygen Induction Type: IV induction Ventilation: Mask ventilation without difficulty Laryngoscope Size: Mac and 3 Grade View: Grade I Tube type: Oral Tube size: 7.0 mm Number of attempts: 1 Airway Equipment and Method: Stylet Placement Confirmation: ETT inserted through vocal cords under direct vision, positive ETCO2 and breath sounds checked- equal and bilateral Secured at: 22 cm Tube secured with: Tape Dental Injury: Teeth and Oropharynx as per pre-operative assessment

## 2023-02-10 NOTE — Anesthesia Preprocedure Evaluation (Addendum)
Anesthesia Evaluation  Patient identified by MRN, date of birth, ID band Patient awake    Reviewed: Allergy & Precautions, NPO status , Patient's Chart, lab work & pertinent test results  Airway Mallampati: II  TM Distance: >3 FB Neck ROM: Full    Dental no notable dental hx.    Pulmonary sleep apnea , Current Smoker and Patient abstained from smoking.   Pulmonary exam normal        Cardiovascular negative cardio ROS Normal cardiovascular exam     Neuro/Psych  Headaches MS (multiple sclerosis)  negative psych ROS   GI/Hepatic Neg liver ROS,GERD  Medicated and Controlled,,  Endo/Other  negative endocrine ROS    Renal/GU negative Renal ROS     Musculoskeletal  (+) Arthritis ,    Abdominal  (+) + obese  Peds  Hematology negative hematology ROS (+)   Anesthesia Other Findings ANAL FISTULA  Reproductive/Obstetrics Hcg negative                             Anesthesia Physical Anesthesia Plan  ASA: 3  Anesthesia Plan: General   Post-op Pain Management:    Induction: Intravenous  PONV Risk Score and Plan: 2 and Ondansetron, Dexamethasone, Midazolam and Treatment may vary due to age or medical condition  Airway Management Planned: Oral ETT  Additional Equipment:   Intra-op Plan:   Post-operative Plan: Extubation in OR  Informed Consent: I have reviewed the patients History and Physical, chart, labs and discussed the procedure including the risks, benefits and alternatives for the proposed anesthesia with the patient or authorized representative who has indicated his/her understanding and acceptance.     Dental advisory given  Plan Discussed with: CRNA  Anesthesia Plan Comments:        Anesthesia Quick Evaluation

## 2023-02-10 NOTE — H&P (Signed)
CC: Here today for surgery  HPI: Lynn Roberts is an 37 y.o. female who presented to the hospital 08/14/20 with large perirectal abscess involving both ischiorectal fossa. She was admitted and started on antibiotics. She was taken to the OR by myself on 08/15/20 where she underwent a modified Hanley procedure. Found to have large bilateral ischiorectal fossa abscesses. Posterior midline transsphincteric fistula. A draining blue vessel loop seton was placed in the posterior midline anal fistula. She was admitted postoperatively where she recovered well. She was discharged home 08/17/20.   OR 12/08/20 -  1. Placement of cutting seton 2. Partial fistulotomy 3. Anorectal exam under anesthesia  Cscope 04/2021 Dr. Bryan Lemma - - Hemorrhoids found on perianal exam. - One 3 mm polyp in the sigmoid colon, removed with a cold snare. Resected and retrieved. - Stool in the entire examined colon. - Non-bleeding internal hemorrhoids. - The examined portion of the ileum was normal.  Recurrent perirectal abscess 10/11/22 where Dr. Donne Hazel did I&D of bilateral left/right perirectal abscesses with placement of penrose   She has had a couple recurrent perianal abscesses over the last 2 to 3 months. No drains remain in place. Currently, denies any complaints. She has no anal pain or evidence of recurrent abscess. Minimal if any drainage   INTERVAL HX She denies any changes in her health or health history since we met in the office. She states she is ready for surgery. Still smoking.  Past Medical History:  Diagnosis Date   Arthritis of low back    Complication of anesthesia    is of [name] descent   Dysrhythmia    hx PAT   Family history of adverse reaction to anesthesia    pt is a [name]   GERD (gastroesophageal reflux disease)    none since pregnancy    Heart murmur    When pt. was pregnant with her last child, and also had murmur as a child.     Hemorrhoids    History of anal fissures     History of PAT (paroxysmal atrial tachycardia)    had cardiac ablation as a teenager   History of sleep apnea    none since 450 pound weight loss in 2015   Migraines    MS (multiple sclerosis) (HCC)    PCOD (polycystic ovarian disease)    no menses    Past Surgical History:  Procedure Laterality Date   BREAST LUMPECTOMY Left 2011   papilloma   BREAST LUMPECTOMY Left 2015   papilloma   BREAST LUMPECTOMY WITH RADIOACTIVE SEED LOCALIZATION Left 10/18/2014   Procedure:  RADIOACTIVE SEED GUIDED EXCISIONAL BREAST BIOPSY;  Surgeon: Alphonsa Overall, MD;  Location: [facility];  Service: General;  Laterality: Left;   BREAST SURGERY Right 07/27/2004   retro-areolar dissection with exc. of large duct   CARDIAC ELECTROPHYSIOLOGY MAPPING AND ABLATION  sge 13   CARDIAC ELECTROPHYSIOLOGY STUDY AND ABLATION  age 37   CESAREAN SECTION N/A 03/28/2020   Procedure: [place];  Surgeon: Deliah Boston, MD;  Location: [facility];  Service: Obstetrics;  Laterality: N/A;  RNFA [place]   CESAREAN SECTION  03/2018   CESAREAN SECTION WITH BILATERAL TUBAL LIGATION Bilateral 12/27/2022   Procedure: CESAREAN SECTION WITH BILATERAL TUBAL LIGATION;  Surgeon: Deliah Boston, MD;  Location: [place] [facility];  Service: Obstetrics;  Laterality: Bilateral;  incr r/o malignant hyperthermia with isoflurane anesthetic induction as patient is [place]   CHOLECYSTECTOMY  04/27/2008   GASTROPLASTY  DUODENAL SWITCH  01/2014   done at [facility] Bilateral 10/11/2022   Procedure: Incision and drainage of Bilateral Perirectoal abscess, Possible Seton;  Surgeon: Rolm Bookbinder, MD;  Location: [facility];  Service: General;  Laterality: Bilateral;   INCISION AND DRAINAGE PERIRECTAL ABSCESS N/A 08/15/2020   Procedure: ANORECTAL EXAM UNDER ANESTHESIA INCISION AND DRAINAGE OF PERIRECTAL ABSCESS x2 WITH PLACEMENT OF SETON;  Surgeon: Ileana Roup, MD;  Location: [facility];  Service:  General;  Laterality: N/A;   OVARIAN CYST REMOVAL Bilateral 1999   PLACEMENT OF SETON  08/15/2020   PLACEMENT OF SETON N/A 12/08/2020   Procedure: PLACEMENT OF CUTTING SETON; PARTIAL FISTULOTOMY;  Surgeon: Ileana Roup, MD;  Location: [facility];  Service: General;  Laterality: N/A;   RECTAL EXAM UNDER ANESTHESIA N/A 12/08/2020   Procedure: ANORECTAL EXAM UNDER ANESTHESIA;  Surgeon: Ileana Roup, MD;  Location: [facility];  Service: General;  Laterality: N/A;   TOENAIL EXCISION  yrs ago   UNILATERAL SALPINGECTOMY      Family History  Problem Relation Age of Onset   Hypertension Mother    Diabetes Mother    Hypertension Father    Hyperlipidemia Father    Cancer Maternal Grandmother        multiple myaloma, breast cancer   Heart disease Maternal Grandmother    Diabetes Maternal Grandfather    Cancer Paternal Grandfather        pancreactic   Colon cancer Neg Hx    Stomach cancer Neg Hx    Rectal cancer Neg Hx    Esophageal cancer Neg Hx     Social:  reports that she has been smoking cigarettes. She has a 5.00 pack-year smoking history. She has never used smokeless tobacco. She reports that she does not drink alcohol and does not use drugs.  Allergies:  Allergies  Allergen Reactions   Nsaids Other (See Comments)    HX. OF WEIGHT-LOSS SURGERY    Sulfa Antibiotics Rash   Tramadol Rash    Had after 2014 and tolerated okay    Medications: I have reviewed the patient's current medications.  Results for orders placed or performed during the hospital encounter of 02/10/23 (from the past 48 hour(s))  Pregnancy, urine POC     Status: None   Collection Time: 02/10/23  7:47 AM  Result Value Ref Range   Preg Test, Ur NEGATIVE NEGATIVE    Comment:        THE SENSITIVITY OF THIS METHODOLOGY IS >24 mIU/mL     No results found.  ROS - all of the below systems have been reviewed with the patient and positives are indicated with bold  text General: chills, fever or night sweats Eyes: blurry vision or double vision ENT: epistaxis or sore throat Allergy/Immunology: itchy/watery eyes or nasal congestion Hematologic/Lymphatic: bleeding problems, blood clots or swollen lymph nodes Endocrine: temperature intolerance or unexpected weight changes Breast: new or changing breast lumps or nipple discharge Resp: cough, shortness of breath, or wheezing CV: chest pain or dyspnea on exertion GI: as per HPI GU: dysuria, trouble voiding, or hematuria MSK: joint pain or joint stiffness Neuro: TIA or stroke symptoms Derm: pruritus and skin lesion changes Psych: anxiety and depression  PE Blood pressure 137/75, pulse 61, temperature 98.2 F (36.8 C), temperature source Oral, resp. rate 15, height '5\' 11"'$  (1.803 m), weight 124.7 kg, last menstrual period 03/29/2022, SpO2 98 %, not currently breastfeeding. Constitutional: NAD; conversant Eyes:  Moist conjunctiva Lungs: Normal respiratory effort CV: RRR; no pitting edema MSK: Normal range of motion of extremities Psychiatric: Appropriate affect  Results for orders placed or performed during the hospital encounter of 02/10/23 (from the past 48 hour(s))  Pregnancy, urine POC     Status: None   Collection Time: 02/10/23  7:47 AM  Result Value Ref Range   Preg Test, Ur NEGATIVE NEGATIVE    Comment:        THE SENSITIVITY OF THIS METHODOLOGY IS >24 mIU/mL     No results found.  A/P: Lynn Roberts is an 37 y.o. female with hx of horseshoe anal abscess/fistula with prior seton and subsequent cutting seton but now with recurrences   -I have strongly recommended she quit smoking and stop all nicotine containing products. We discussed the rationale therein including high risk for recurrent abscesses and anal fistula persistence. She expressed understanding.  -The anatomy and physiology of the anal canal was discussed with the patient with associated pictures. The pathophysiology of  anal abscess/fistula was discussed at length with associated pictures and illustrations.  -We have reviewed options going forward including further observation vs surgery - anorectal exam under anesthesia with control of anal fistula and likely placement of draining seton(s). Possible incision/drainage of perianal abscess if present.  -The planned procedures, material risks (including, but not limited to, pain, bleeding, infection, scarring, damage to anal sphincter, incontinence of gas and/or stool, need for additional procedures, anal stenosis, rare cases of pelvic sepsis which in severe cases may require things like a colostomy, recurrence, pneumonia, heart attack, stroke, death) benefits and alternatives to surgery were discussed at length. The patient's questions were answered to her satisfaction, she voiced understanding and elected to proceed with surgery. Additionally, we discussed typical postoperative expectations and the recovery process.   Nadeen Landau, [facility], [place]

## 2023-02-11 ENCOUNTER — Encounter: Payer: Self-pay | Admitting: Neurology

## 2023-02-11 ENCOUNTER — Encounter (HOSPITAL_COMMUNITY): Payer: Self-pay | Admitting: Surgery

## 2023-02-11 DIAGNOSIS — G35 Multiple sclerosis: Secondary | ICD-10-CM | POA: Diagnosis not present

## 2023-02-11 DIAGNOSIS — Z1389 Encounter for screening for other disorder: Secondary | ICD-10-CM | POA: Diagnosis not present

## 2023-02-11 DIAGNOSIS — Z3009 Encounter for other general counseling and advice on contraception: Secondary | ICD-10-CM | POA: Diagnosis not present

## 2023-02-11 DIAGNOSIS — Z9889 Other specified postprocedural states: Secondary | ICD-10-CM | POA: Diagnosis not present

## 2023-02-11 DIAGNOSIS — K611 Rectal abscess: Secondary | ICD-10-CM | POA: Diagnosis not present

## 2023-02-13 NOTE — Telephone Encounter (Signed)
Holli [id number] exp. 02/13/23-08/12/23, BCBS No auth required via [place] E sent to [place]

## 2023-02-17 ENCOUNTER — Other Ambulatory Visit: Payer: 59

## 2023-02-20 ENCOUNTER — Encounter: Payer: Self-pay | Admitting: Hematology and Oncology

## 2023-02-20 NOTE — Telephone Encounter (Signed)
[employer]: [id number] exp. 02/20/23-04/20/23 For [place]

## 2023-02-22 ENCOUNTER — Ambulatory Visit (HOSPITAL_COMMUNITY)
Admission: RE | Admit: 2023-02-22 | Discharge: 2023-02-22 | Disposition: A | Discharge status: Home / Self Care | Payer: 59 | Source: Ambulatory Visit | Attending: Neurology | Admitting: Neurology

## 2023-02-22 ENCOUNTER — Other Ambulatory Visit: Payer: 59

## 2023-02-22 DIAGNOSIS — G35 Multiple sclerosis: Secondary | ICD-10-CM | POA: Insufficient documentation

## 2023-02-22 MED ORDER — GADOBUTROL 1 MMOL/ML IV SOLN
10.0000 mL | Freq: Once | INTRAVENOUS | Status: AC | PRN
  Administered 2023-02-22: 10 mL via INTRAVENOUS

## 2023-03-04 ENCOUNTER — Encounter: Payer: Self-pay | Admitting: Hematology and Oncology

## 2023-03-07 ENCOUNTER — Encounter: Payer: Self-pay | Admitting: Hematology and Oncology

## 2023-03-17 DIAGNOSIS — K603 Anal fistula: Secondary | ICD-10-CM | POA: Diagnosis not present

## 2023-03-19 ENCOUNTER — Telehealth: Payer: Self-pay | Admitting: Neurology

## 2023-03-19 NOTE — Telephone Encounter (Addendum)
Tried submitted PA using key provided. Received the following response: "Your PA request cannot be processed for the member plan submitted. For further inquiries please contact the number on the back of the member prescription card."  I submitted a new PA on covermymeds. Key: [id number]. Waiting on determination from [facility].

## 2023-03-19 NOTE — Telephone Encounter (Signed)
Jacquline called from [place]. Stated pt PA for Lynn Roberts is about to expire but they have created a new one KEY code: [id number].

## 2023-03-24 NOTE — Telephone Encounter (Signed)
Received fax that PA approved 01/20/23-03/19/25. Ref# [id number].

## 2023-04-09 ENCOUNTER — Encounter: Payer: Self-pay | Admitting: Hematology and Oncology

## 2023-04-14 NOTE — Telephone Encounter (Signed)
Received updated fax from [employer] that PA Zeposia approved 04/11/23-04/10/24. PA#[id number]

## 2023-05-21 ENCOUNTER — Encounter: Payer: Self-pay | Admitting: Hematology and Oncology

## 2023-05-25 ENCOUNTER — Other Ambulatory Visit: Payer: Self-pay | Admitting: Neurology

## 2023-05-28 ENCOUNTER — Ambulatory Visit (INDEPENDENT_AMBULATORY_CARE_PROVIDER_SITE_OTHER): Payer: No Typology Code available for payment source | Admitting: Neurology

## 2023-05-28 ENCOUNTER — Encounter: Payer: Self-pay | Admitting: Neurology

## 2023-05-28 VITALS — BP 124/81 | HR 95 | Wt 274.0 lb

## 2023-05-28 DIAGNOSIS — E610 Copper deficiency: Secondary | ICD-10-CM

## 2023-05-28 DIAGNOSIS — E509 Vitamin A deficiency, unspecified: Secondary | ICD-10-CM

## 2023-05-28 DIAGNOSIS — F32A Depression, unspecified: Secondary | ICD-10-CM

## 2023-05-28 DIAGNOSIS — R5383 Other fatigue: Secondary | ICD-10-CM

## 2023-05-28 DIAGNOSIS — G35 Multiple sclerosis: Secondary | ICD-10-CM

## 2023-05-28 DIAGNOSIS — G35D Multiple sclerosis, unspecified: Secondary | ICD-10-CM

## 2023-05-28 DIAGNOSIS — E538 Deficiency of other specified B group vitamins: Secondary | ICD-10-CM

## 2023-05-28 DIAGNOSIS — E559 Vitamin D deficiency, unspecified: Secondary | ICD-10-CM

## 2023-05-28 DIAGNOSIS — Z79899 Other long term (current) drug therapy: Secondary | ICD-10-CM

## 2023-05-28 DIAGNOSIS — Z9884 Bariatric surgery status: Secondary | ICD-10-CM | POA: Diagnosis not present

## 2023-05-28 MED ORDER — OXYBUTYNIN CHLORIDE ER 15 MG PO TB24: 15.0000 mg | ORAL_TABLET | Freq: Every day | ORAL | 3 refills | Status: DC

## 2023-05-28 NOTE — Progress Notes (Signed)
[facility]  PATIENT: Lynn Roberts DOB: 1986-01-29  REFERRING DOCTOR OR PCP:  Dr. Georges Mouse (Ophthalmology); Milon Dikes, MD (Neuro-hospitalist); Thad Ranger (PCP) SOURCE:   _________________________________   HISTORICAL  CHIEF COMPLAINT:  Chief Complaint  Patient presents with   Room 11    Pt is here Alone. Pt states that some days she feels More tired than other days. Pt states she doesn't have any muscle weakness in her arms and legs. Pt states that she doesn't have any burning or tingling sensations in her legs.     HISTORY OF PRESENT ILLNESS:  Lynn Roberts, is a 37 y.o. woman with relapsing remitting multiple sclerosis.   UPDATE 11/27/2022: She had a C-section 12/27/2022 and hr daughter is doing very well.  During the pregnancy she was on Copaxone and she went back on Zeposia mid February 2024.    She did not breastfeed.      She feesl fatigue is worse.    She had Vitamin A deficiency during her pregnancy.   She has a h/o gastric bypass and has had B12, copper and Vit D deficiency.    She feels her MS is stable.    She has returned to work 04/14/2023.   She feels more tired since returning to work, some days much more than others.   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    She feels her gait is stable and she has no difficulty on even surfaces.     Balance is slightly off but not poor.   She does not need cane.  .   She uses the bannister on stairs.  No falls this year.   She feels strength is doing well.   No muscle spasms.   She denies tingling or numbness.     She presented with left optic neuritis.  Vision improved but not completely to baseline and she continues to have mild visual blurring and reduced color saturation OS..   She still sees  firefly like floaters off/on.       She has urinary frequency and rare urge incontinence.  Oxybutynin has helped some but seems to wear off before next pill .  Headaches are  doing ok   She sometmes has leg pain and back pain since yesterday as well.   She is sleeping well most nights.  Mood is a little worse and sometimes she feels things get to her more.   More stress with her husband had a suicide attempt in March..  He is doing better.    She has occasional nervousness but no severe anxiety.    She is on sertraline.   Cognition is doing well.    She is seeing [facility]).     MS History: She presented to [facility] 02/22/2021 after she had the onset of left eye pain, headache and complete loss of vision OS 02/21/2021.  She saw Dr. Clelia Croft (ophthalmology) who advised her to go to the ED.    She had studies and was admitted and received 5 days of IV steroids.  MRI of the orbits confirmed left optic neuritis.  MRI of the brain showed multiple foci consistent with MS.  2 foci in the left hemisphere enhanced after contrast.  MRI of the spine did not show any additional lesions though there was extensive movement artifact that limited the study.   She had no other neurologic episode in the past.  She began to improve a day after the IV Solu-Medrol was initiated and was much better by discharge.      She started Zeposia 04/2021.    She became pregnant and stopped Zeposia 05/2021 (was 7-[redacted] weeks pregnant at time)   She started Copaxone 06/2022.    She went back to [facility] 01/2023.      Other Medical:  She was > 650 pounds and had bariatric surgery (duodenal switch), losing > 400 pounds in 2015.   She had an anal fissure last year requiring surgery and still has some mild bleeding.   She has seen surgery and they have requested a colonoscopy.     She smokes and is trying to quit (using patches).   She has two kids ages 1 and 3.       Imaging: MRI brain 01/2023 showed no new lesions  MRI of the brain 02/22/2021 shows multiple T2/FLAIR hyperintense foci in the hemispheres in a pattern consistent with multiple sclerosis.  2 foci on the left show subtle enhancement after  contrast.  MRI of the orbits 02/22/2021 shows enhancement of the left optic nerve.  MRI of the cervical spine 02/23/2021 limited by movement artifact shows a normal spinal cord and mild multilevel degenerative changes.  MRI of the thoracic spine 02/23/2021 limited by movement artifact shows a normal spinal cord and degenerative changes with mild spinal stenosis at T11-T12 and T12-L1.   Laboratory tests March 2022:   Anti-NMO and anti-MOG are negative.  B12 low at 152 (180-914).   EKG normal 2/.2021  REVIEW OF SYSTEMS: Constitutional: No fevers, chills, sweats, or change in appetite Eyes: No visual changes, double vision, eye pain Ear, nose and throat: No hearing loss, ear pain, nasal congestion, sore throat Cardiovascular: No chest pain, palpitations Respiratory:  No shortness of breath at rest or with exertion.   No wheezes GastrointestinaI: No nausea, vomiting, diarrhea, abdominal pain, fecal incontinence Genitourinary:  No dysuria, urinary retention or frequency.  No nocturia. Musculoskeletal:  No neck pain, back pain Integumentary: No rash, pruritus, skin lesions Neurological: as above Psychiatric: No depression at this time.  No anxiety Endocrine: No palpitations, diaphoresis, change in appetite, change in weigh or increased thirst Hematologic/Lymphatic:  No anemia, purpura, petechiae. Allergic/Immunologic: No itchy/runny eyes, nasal congestion, recent allergic reactions, rashes  ALLERGIES: Allergies  Allergen Reactions   Nsaids Other (See Comments)    HX. OF WEIGHT-LOSS SURGERY    Sulfa Antibiotics Rash   Tramadol Rash    Had after 2014 and tolerated okay    HOME MEDICATIONS:  Current Outpatient Medications:    Cholecalciferol (VITAMIN D) 125 MCG (5000 UT) CAPS, Take 10,000 Units by mouth daily., Disp: , Rfl:    Copper Gluconate (COPPER CAPS) 2 MG CAPS, Take 2 mg by mouth daily., Disp: , Rfl:    cyanocobalamin (VITAMIN B12) 1000 MCG/ML injection, INJECT 1 ML INTO THE  SKIN EVERY 4 WEEKS, Disp: 1 mL, Rfl: 0   ferrous sulfate 325 (65 FE) MG tablet, Take 325 mg by mouth daily with breakfast., Disp: , Rfl:    hydrocortisone (ANUSOL-HC) 2.5 % rectal cream, Place 1 Application rectally daily as needed for hemorrhoids or anal itching., Disp: , Rfl:    Multiple Vitamin (MULTIVITAMIN WITH MINERALS) TABS tablet, Take 1 tablet by mouth daily. (Patient taking differently: Take 2 tablets by mouth daily. Flinstone), Disp: 30 tablet, Rfl: 0   NIFEdipine (PROCARDIA-XL/NIFEDICAL-XL) 30 MG 24 hr tablet, Take 30 mg by mouth daily., Disp: , Rfl:  nystatin ointment (MYCOSTATIN), Apply 1 Application topically daily as needed (Rash)., Disp: , Rfl:    oxybutynin (DITROPAN XL) 15 MG 24 hr tablet, Take 1 tablet (15 mg total) by mouth at bedtime., Disp: 90 tablet, Rfl: 3   sertraline (ZOLOFT) 50 MG tablet, TAKE 1 TABLET BY MOUTH EVERY DAY, Disp: 90 tablet, Rfl: 3   ZEPOSIA 0.92 MG CAPS, Take 1 capsule by mouth daily., Disp: , Rfl:    ALPRAZolam (XANAX) 0.5 MG tablet, Take 1-2 tablets by mouth about 30 min prior to MRI. Can take additional tablet at time of test if needed. Must have driver to and from test, can cause drowsiness., Disp: 3 tablet, Rfl: 0   famotidine (PEPCID) 20 MG tablet, Take 1 tablet (20 mg total) by mouth 2 (two) times daily as needed for heartburn. (Patient not taking: Reported on 05/28/2023), Disp: 60 tablet, Rfl: 0   nicotine (NICODERM CQ - DOSED IN MG/24 HOURS) 14 mg/24hr patch, Place 14 mg onto the skin daily. (Patient not taking: Reported on 05/28/2023), Disp: , Rfl:    nicotine (NICODERM CQ) 7 mg/24hr patch, Place 1 patch (7 mg total) onto the skin daily. (Patient not taking: Reported on 01/23/2023), Disp: 28 patch, Rfl: 1   nicotine polacrilex (NICORETTE) 2 MG gum, TAKE 1 EACH (2 MG) BY MOUTH AS NEEDED FOR SMOKING CESSATION (Patient not taking: Reported on 05/28/2023), Disp: 100 each, Rfl: 1   NIFEdipine (ADALAT CC) 30 MG 24 hr tablet, Take 1 tablet (30 mg total) by  mouth daily. (Patient not taking: Reported on 05/28/2023), Disp: 30 tablet, Rfl: 0   Prenat-FeCbn-FeAsp-Meth-FA-DHA (PRENATE MINI) 18-0.6-0.4-350 MG CAPS, Take 1 capsule by mouth daily. (Patient not taking: Reported on 05/28/2023), Disp: , Rfl:   PAST MEDICAL HISTORY: Past Medical History:  Diagnosis Date   Arthritis of low back    Complication of anesthesia    is of [place] descent   Dysrhythmia    hx PAT   Family history of adverse reaction to anesthesia    pt is a [place]   GERD (gastroesophageal reflux disease)    none since pregnancy    Heart murmur    When pt. was pregnant with her last child, and also had murmur as a child.     Hemorrhoids    History of anal fissures    History of PAT (paroxysmal atrial tachycardia)    had cardiac ablation as a teenager   History of sleep apnea    none since 450 pound weight loss in 2015   Migraines    MS (multiple sclerosis) (HCC)    PCOD (polycystic ovarian disease)    no menses    PAST SURGICAL HISTORY: Past Surgical History:  Procedure Laterality Date   BREAST LUMPECTOMY Left 2011   papilloma   BREAST LUMPECTOMY Left 2015   papilloma   BREAST LUMPECTOMY WITH RADIOACTIVE SEED LOCALIZATION Left 10/18/2014   Procedure:  RADIOACTIVE SEED GUIDED EXCISIONAL BREAST BIOPSY;  Surgeon: Ovidio Kin, MD;  Location: [facility];  Service: General;  Laterality: Left;   BREAST SURGERY Right 07/27/2004   retro-areolar dissection with exc. of large duct   CARDIAC ELECTROPHYSIOLOGY MAPPING AND ABLATION  sge 13   CARDIAC ELECTROPHYSIOLOGY STUDY AND ABLATION  age 36   CESAREAN SECTION N/A 03/28/2020   Procedure: CESAREAN SECTION;  Surgeon: Carlisle Cater, MD;  Location: [facility];  Service: Obstetrics;  Laterality: N/A;  RNFA Tracey   CESAREAN SECTION  03/2018   CESAREAN SECTION WITH  BILATERAL TUBAL LIGATION Bilateral 12/27/2022   Procedure: CESAREAN SECTION WITH BILATERAL TUBAL LIGATION;  Surgeon: Carlisle Cater, MD;   Location: [facility];  Service: Obstetrics;  Laterality: Bilateral;  incr r/o malignant hyperthermia with isoflurane anesthetic induction as patient is [place]   CHOLECYSTECTOMY  04/27/2008   EVALUATION UNDER ANESTHESIA WITH ANAL FISTULECTOMY N/A 02/10/2023   Procedure: CONTROL OF ANAL FISTULAS WITH PLACEMENT OF SETON AND ANORECTAL EXAM UNDER ANESTHESIA;  Surgeon: Andria Meuse, MD;  Location: [facility];  Service: General;  Laterality: N/A;   GASTROPLASTY DUODENAL SWITCH  01/2014   done at [facility]   INCISION AND DRAINAGE ABSCESS Bilateral 10/11/2022   Procedure: Incision and drainage of Bilateral Perirectoal abscess, Possible Seton;  Surgeon: Emelia Loron, MD;  Location: [facility] OR;  Service: General;  Laterality: Bilateral;   INCISION AND DRAINAGE PERIRECTAL ABSCESS N/A 08/15/2020   Procedure: ANORECTAL EXAM UNDER ANESTHESIA INCISION AND DRAINAGE OF PERIRECTAL ABSCESS x2 WITH PLACEMENT OF SETON;  Surgeon: Andria Meuse, MD;  Location: [facility];  Service: General;  Laterality: N/A;   OVARIAN CYST REMOVAL Bilateral 1999   PLACEMENT OF SETON  08/15/2020   PLACEMENT OF SETON N/A 12/08/2020   Procedure: PLACEMENT OF CUTTING SETON; PARTIAL FISTULOTOMY;  Surgeon: Andria Meuse, MD;  Location: [facility];  Service: General;  Laterality: N/A;   RECTAL EXAM UNDER ANESTHESIA N/A 12/08/2020   Procedure: ANORECTAL EXAM UNDER ANESTHESIA;  Surgeon: Andria Meuse, MD;  Location: [facility];  Service: General;  Laterality: N/A;   TOENAIL EXCISION  yrs ago   UNILATERAL SALPINGECTOMY      FAMILY HISTORY: Family History  Problem Relation Age of Onset   Hypertension Mother    Diabetes Mother    Hypertension Father    Hyperlipidemia Father    Cancer Maternal Grandmother        multiple myaloma, breast cancer   Heart disease Maternal Grandmother    Diabetes Maternal Grandfather    Cancer Paternal Grandfather        pancreactic   Colon cancer Neg Hx     Stomach cancer Neg Hx    Rectal cancer Neg Hx    Esophageal cancer Neg Hx     SOCIAL HISTORY:  Social History   Socioeconomic History   Marital status: Married    Spouse name: Reuel Boom   Number of children: 2   Years of education: 12   Highest education level: Not on file  Occupational History   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    Lives with husband   Caffeine use: 1 cup per day of tea   Social Determinants of Health   Financial Resource Strain: Not on file  Food Insecurity: No Food Insecurity (12/30/2022)   Hunger Vital Sign    Worried About Running Out of Food  in the Last Year: Never true    Ran Out of Food in the Last Year: Never true  Transportation Needs: No Transportation Needs (12/30/2022)   PRAPARE - Administrator, Civil Service (Medical): No    Lack of Transportation (Non-Medical): No  Physical Activity: Not on file  Stress: Not on file  Social Connections: Not on file  Intimate Partner Violence: Not At Risk (12/27/2022)   Humiliation, Afraid, Rape, and Kick questionnaire    Fear of Current or Ex-Partner: No    Emotionally Abused: No    Physically Abused: No    Sexually Abused: No     PHYSICAL EXAM  Vitals:   05/28/23 1525  BP: 124/81  Pulse: 95  Weight: 274 lb (124.3 kg)    Body mass index is 38.22 kg/m.   No results found.   General: The patient is well-developed and well-nourished and in no acute distress  HEENT:  Head is [place]/AT.   Sclera are anicteric.    Skin: Extremities are without rash or edema.  Neurologic Exam  Mental status: The patient is alert and oriented x 3 at the time of the examination. The patient has apparent normal recent and remote memory, with an apparently normal attention span and concentration ability.   Speech is normal.  Cranial nerves: Extraocular movements are full.  Reduced color vision OS.  Visual acuity is fairly symmetrical.  There is good facial sensation to soft touch bilaterally.Facial strength is normal.  No obvious hearing deficits are noted.  Motor:  Muscle bulk is normal.   Tone is normal. Strength is  5 / 5 in all 4 extremities.   Sensory: Sensory testing is intact to pinprick, soft touch and vibration sensation in all 4 extremities.  Coordination: Cerebellar testing reveals good finger-nose-finger and heel-to-shin bilaterally.  Gait and station: Station is normal.   Gait is normal.  Tandem gait is slightly wide   Reflexes: Deep tendon reflexes are symmetric and normal bilaterally.      DIAGNOSTIC DATA (LABS, IMAGING, TESTING) - I reviewed patient records, labs, notes, testing and imaging myself where available.  Lab Results  Component Value Date   WBC 5.7 01/27/2023   HGB 12.2 01/27/2023   HCT 40.2 01/27/2023   MCV 86.5 01/27/2023   PLT 262 01/27/2023      Component Value Date/Time   NA 139 01/27/2023 1314   NA 143 06/20/2021 1539   K 3.6 01/27/2023 1314   CL 108 01/27/2023 1314   CO2 25 01/27/2023 1314   GLUCOSE 71 01/27/2023 1314   BUN 7 01/27/2023 1314   BUN 9 06/20/2021 1539   CREATININE 0.59 01/27/2023 1314   CREATININE 0.62 03/03/2020 1452   CALCIUM 8.3 (L) 01/27/2023 1314   PROT 5.1 (L) 12/27/2022 2206   PROT 6.1 12/26/2021 1634   ALBUMIN 2.3 (L) 12/27/2022 2206   ALBUMIN 3.6 (L) 12/26/2021 1634   AST 22 12/27/2022 2206   AST 10 (L) 03/03/2020 1452   ALT 16 12/27/2022 2206   ALT 10 03/03/2020 1452   ALKPHOS 69 12/27/2022 2206   BILITOT 0.3  12/27/2022 2206   BILITOT 0.3 12/26/2021 1634   BILITOT 0.4 03/03/2020 1452   GFRNONAA >60 01/27/2023 1314   GFRNONAA >60 03/03/2020 1452   GFRAA >60 08/15/2020 0354   GFRAA >60 03/03/2020 1452   No results found for: "CHOL", "HDL", "LDLCALC", "LDLDIRECT", "TRIG", "CHOLHDL" Lab Results  Component Value Date   HGBA1C 4.6 (L) 11/05/2017   Lab Results  Component Value Date   VITAMINB12 617 10/11/2022   Lab Results  Component Value Date   TSH 1.380 10/12/2019       ASSESSMENT AND PLAN  Multiple sclerosis (HCC) - Plan: CBC with Differential/Platelet, Comprehensive metabolic panel, Thyroid Panel With TSH  High risk medication use - Plan: CBC with Differential/Platelet, Comprehensive metabolic panel, Vitamin A, Thyroid Panel With TSH  S/P bariatric surgery - Plan: Comprehensive metabolic panel, Vitamin A, Vitamin B12, Copper, serum  B12 deficiency - Plan: Vitamin B12  Copper deficiency - Plan: Copper, serum  Vitamin D deficiency - Plan: VITAMIN D 25 Hydroxy (Vit-D Deficiency, Fractures)  Depression, unspecified depression type - Plan: Thyroid Panel With TSH  Vitamin A deficiency - Plan: Vitamin A  Other fatigue - Plan: CBC with Differential/Platelet, Comprehensive metabolic panel, Vitamin B12, Thyroid Panel With TSH   Continue  Zeposia and check labs  Stay active and exercise as tolerated.   Due to fatigue and h/o bariatric surgery, we will check labs for malabsorption.   Continue supplements.   Rtc 6 months or call sooner if new or worsening issues.     Mickael Mcnutt A. Epimenio Foot, MD, [facility] 05/28/2023, 3:55 PM Certified in Neurology, Clinical Neurophysiology, Sleep Medicine and Neuroimaging  [facility] [address], [address] [place], [place] [address] [phone]

## 2023-05-28 NOTE — Telephone Encounter (Signed)
Last seen on 11/27/22 per note "Continue Vit D, Vit B12 (shots) and copper supplements. " Follow up scheduled on 05/27/25

## 2023-05-30 LAB — CBC WITH DIFFERENTIAL/PLATELET
Basophils Absolute: 0 10*3/uL (ref 0.0–0.2)
Basos: 1 %
EOS (ABSOLUTE): 0.1 10*3/uL (ref 0.0–0.4)
Eos: 2 %
Hematocrit: 37.8 % (ref 34.0–46.6)
Hemoglobin: 12.2 g/dL (ref 11.1–15.9)
Immature Grans (Abs): 0 10*3/uL (ref 0.0–0.1)
Immature Granulocytes: 0 %
Lymphocytes Absolute: 1.1 10*3/uL (ref 0.7–3.1)
Lymphs: 24 %
MCH: 27 pg (ref 26.6–33.0)
MCHC: 32.3 g/dL (ref 31.5–35.7)
MCV: 84 fL (ref 79–97)
Monocytes Absolute: 0.4 10*3/uL (ref 0.1–0.9)
Monocytes: 10 %
Neutrophils Absolute: 2.8 10*3/uL (ref 1.4–7.0)
Neutrophils: 63 %
Platelets: 222 10*3/uL (ref 150–450)
RBC: 4.52 x10E6/uL (ref 3.77–5.28)
RDW: 13.1 % (ref 11.7–15.4)
WBC: 4.4 10*3/uL (ref 3.4–10.8)

## 2023-05-30 LAB — COMPREHENSIVE METABOLIC PANEL
ALT: 12 IU/L (ref 0–32)
AST: 13 IU/L (ref 0–40)
Albumin: 3.9 g/dL (ref 3.9–4.9)
Alkaline Phosphatase: 57 IU/L (ref 44–121)
BUN/Creatinine Ratio: 13 (ref 9–23)
BUN: 7 mg/dL (ref 6–20)
Bilirubin Total: 0.4 mg/dL (ref 0.0–1.2)
CO2: 21 mmol/L (ref 20–29)
Calcium: 8.7 mg/dL (ref 8.7–10.2)
Chloride: 109 mmol/L — ABNORMAL HIGH (ref 96–106)
Creatinine, Ser: 0.53 mg/dL — ABNORMAL LOW (ref 0.57–1.00)
Globulin, Total: 2.2 g/dL (ref 1.5–4.5)
Glucose: 77 mg/dL (ref 70–99)
Potassium: 4.2 mmol/L (ref 3.5–5.2)
Sodium: 142 mmol/L (ref 134–144)
Total Protein: 6.1 g/dL (ref 6.0–8.5)
eGFR: 121 mL/min/{1.73_m2} (ref >59)

## 2023-05-30 LAB — THYROID PANEL WITH TSH
Free Thyroxine Index: 2 (ref 1.2–4.9)
T3 Uptake Ratio: 33 % (ref 24–39)
T4, Total: 6.1 ug/dL (ref 4.5–12.0)
TSH: 1.62 u[IU]/mL (ref 0.450–4.500)

## 2023-05-30 LAB — COPPER, SERUM: Copper: 77 ug/dL — ABNORMAL LOW (ref 80–158)

## 2023-05-30 LAB — VITAMIN A: Vitamin A: 23.2 ug/dL (ref 18.9–57.3)

## 2023-05-30 LAB — VITAMIN B12: Vitamin B-12: 449 pg/mL (ref 232–1245)

## 2023-05-30 LAB — VITAMIN D 25 HYDROXY (VIT D DEFICIENCY, FRACTURES): Vit D, 25-Hydroxy: 16.5 ng/mL — ABNORMAL LOW (ref 30.0–100.0)

## 2023-06-02 ENCOUNTER — Other Ambulatory Visit: Payer: Self-pay | Admitting: Neurology

## 2023-06-02 ENCOUNTER — Encounter: Payer: Self-pay | Admitting: Neurology

## 2023-06-02 MED ORDER — VITAMIN D (ERGOCALCIFEROL) 1.25 MG (50000 UNIT) PO CAPS: 50000.0000 [IU] | ORAL_CAPSULE | ORAL | 1 refills | Status: DC

## 2023-06-04 ENCOUNTER — Other Ambulatory Visit: Payer: Self-pay | Admitting: Neurology

## 2023-06-04 MED ORDER — NORTRIPTYLINE HCL 25 MG PO CAPS: 25.0000 mg | ORAL_CAPSULE | Freq: Every day | ORAL | 5 refills | Status: DC

## 2023-06-04 MED ORDER — METHYLPREDNISOLONE 4 MG PO TABS: ORAL_TABLET | ORAL | 0 refills | Status: DC

## 2023-06-09 ENCOUNTER — Other Ambulatory Visit: Payer: Self-pay

## 2023-06-09 MED ORDER — CYANOCOBALAMIN 1000 MCG/ML IJ SOLN: INTRAMUSCULAR | 0 refills | Status: DC

## 2023-06-18 ENCOUNTER — Encounter: Payer: Self-pay | Admitting: Neurology

## 2023-06-18 MED ORDER — VITAMIN D (ERGOCALCIFEROL) 1.25 MG (50000 UNIT) PO CAPS: 50000.0000 [IU] | ORAL_CAPSULE | ORAL | 1 refills | Status: DC

## 2023-06-23 ENCOUNTER — Other Ambulatory Visit: Payer: Self-pay | Admitting: *Deleted

## 2023-06-23 NOTE — Telephone Encounter (Signed)
Error opened wrong chart

## 2023-06-28 ENCOUNTER — Other Ambulatory Visit: Payer: Self-pay | Admitting: Neurology

## 2023-07-15 ENCOUNTER — Encounter: Payer: Self-pay | Admitting: Hematology and Oncology

## 2023-09-09 ENCOUNTER — Telehealth: Payer: Self-pay | Admitting: *Deleted

## 2023-09-09 MED ORDER — ZEPOSIA 0.92 MG PO CAPS: 1.0000 | ORAL_CAPSULE | Freq: Every day | ORAL | 0 refills | Status: DC

## 2023-09-09 NOTE — Telephone Encounter (Signed)
Last seen on 05/28/23 Follow up 12/07/23

## 2023-09-19 ENCOUNTER — Other Ambulatory Visit: Payer: Self-pay | Admitting: Obstetrics and Gynecology

## 2023-09-19 DIAGNOSIS — N6320 Unspecified lump in the left breast, unspecified quadrant: Secondary | ICD-10-CM

## 2023-10-08 ENCOUNTER — Encounter: Payer: Self-pay | Admitting: Hematology and Oncology

## 2023-10-15 ENCOUNTER — Ambulatory Visit
Admission: RE | Admit: 2023-10-15 | Discharge: 2023-10-15 | Disposition: A | Discharge status: Home / Self Care | Payer: 59 | Source: Ambulatory Visit | Attending: Obstetrics and Gynecology

## 2023-10-15 ENCOUNTER — Encounter: Payer: Self-pay | Admitting: Hematology and Oncology

## 2023-10-15 ENCOUNTER — Ambulatory Visit
Admission: RE | Admit: 2023-10-15 | Discharge: 2023-10-15 | Disposition: A | Discharge status: Home / Self Care | Payer: No Typology Code available for payment source | Source: Ambulatory Visit | Attending: Obstetrics and Gynecology | Admitting: Obstetrics and Gynecology

## 2023-10-15 DIAGNOSIS — N6320 Unspecified lump in the left breast, unspecified quadrant: Secondary | ICD-10-CM

## 2023-12-01 ENCOUNTER — Other Ambulatory Visit: Payer: Self-pay | Admitting: Neurology

## 2023-12-17 ENCOUNTER — Encounter: Payer: Self-pay | Admitting: Neurology

## 2023-12-17 ENCOUNTER — Ambulatory Visit (INDEPENDENT_AMBULATORY_CARE_PROVIDER_SITE_OTHER): Payer: No Typology Code available for payment source | Admitting: Neurology

## 2023-12-17 VITALS — BP 129/76 | HR 65 | Ht 71.0 in | Wt 267.0 lb

## 2023-12-17 DIAGNOSIS — E559 Vitamin D deficiency, unspecified: Secondary | ICD-10-CM

## 2023-12-17 DIAGNOSIS — G35 Multiple sclerosis: Secondary | ICD-10-CM | POA: Diagnosis not present

## 2023-12-17 DIAGNOSIS — R5383 Other fatigue: Secondary | ICD-10-CM

## 2023-12-17 DIAGNOSIS — Z79899 Other long term (current) drug therapy: Secondary | ICD-10-CM

## 2023-12-17 DIAGNOSIS — E610 Copper deficiency: Secondary | ICD-10-CM

## 2023-12-17 DIAGNOSIS — E538 Deficiency of other specified B group vitamins: Secondary | ICD-10-CM

## 2023-12-17 DIAGNOSIS — Z9884 Bariatric surgery status: Secondary | ICD-10-CM

## 2023-12-17 MED ORDER — OXYBUTYNIN CHLORIDE 5 MG PO TABS: ORAL_TABLET | ORAL | 3 refills | Status: DC

## 2023-12-17 MED ORDER — MODAFINIL 200 MG PO TABS: 200.0000 mg | ORAL_TABLET | Freq: Every day | ORAL | 5 refills | Status: DC

## 2023-12-17 NOTE — Progress Notes (Signed)
 [facility]  PATIENT: Lynn Roberts DOB: September 21, 1986  REFERRING DOCTOR OR PCP:  Dr. Larue Pock (Ophthalmology); Tona Francis, MD (Neuro-hospitalist); Eleanore Grey (PCP) SOURCE:   _________________________________   HISTORICAL  CHIEF COMPLAINT:  Chief Complaint  Patient presents with   Room 11    Pt is here with her Son. Pt states she has been doing well since her last appointment. Pt denies any new questions or concerns to discuss today.     HISTORY OF PRESENT ILLNESS:  Lynn Roberts, is a 38 y.o. woman with relapsing remitting multiple sclerosis.   UPDATE 12/17/2023 During the pregnancy (delivered 12/2022) she was on Copaxone  and she went back on Zeposia  mid February 2024.    She did not breastfeed.     She had Vitamin A  deficiency during her pregnancy.   She has a h/o gastric bypass and has had B12, copper  and Vit D deficiency.     Vit D was low at 14 and we started 50000 Units weekly - she will change to  5000 daily.  She is tolerating Zeposia  well.  She feels her MS is stable.    She states her gait is stable and she has no difficulty on even surfaces.  She walks more slowly with longer distance.     Balance is slightly off but not poor.   She uses the bannister on stairs.  No falls this year.   She feels strength is doing well.   No muscle spasms.   She denies tingling or numbness.     She presented with left optic neuritis.  Vision improved.   Still ha smild reduced VA and color saturation OS..   She still sees  firefly like floaters off/on.       She has urinary frequency and rare urge incontinence.  Oxybutynin  has helped and we will chang eto tid as bid sometimes wears off.  The immediate release preparation seemed to help her better than the extended release.   She is sleeping well most nights but has daily fatigue.    She does not have excessive daytime sleepiness.  Mood is a little worse and sometimes she feels things get to her more.   More  stress with her husband had a suicide attempt in March..  He is doing better.    She has occasional nervousness but no severe anxiety.    She is on sertraline .   Cognition is doing well.    She has returned to work 04/14/2023.   She feels more tired since returning to work, some days much more than others.   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    MS History: She presented to [facility] 02/22/2021 after she had the onset of left eye pain, headache and complete loss of vision OS 02/21/2021.  She saw Dr. Bernetta Brilliant (ophthalmology) who advised her to go to the ED.    She had studies and was admitted and received 5 days of IV steroids.  MRI of the orbits confirmed left optic neuritis.  MRI of the brain showed multiple foci consistent with MS.  2 foci in the left hemisphere enhanced after contrast.  MRI of the spine did not show any additional lesions though there was extensive movement artifact that limited the study.   She had no other neurologic episode in the past.   She began to improve a day after the IV Solu-Medrol  was initiated and was much better by  discharge.      She started Zeposia  04/2021.    She became pregnant and stopped Zeposia  05/2021 (was 7-[redacted] weeks pregnant at time)   She started Copaxone  06/2022.    She went back to Zeposia  01/2023.      Other Medical:  She was > 650 pounds and had bariatric surgery (duodenal switch), losing > 400 pounds in 2015.   She had an anal fissure last year requiring surgery and still has some mild bleeding.   She has seen surgery and they have requested a colonoscopy.     She smokes and is trying to quit (using patches).   She has two kids ages 1 and 3.       Imaging: MRI brain 01/2023 showed no new lesions  MRI of the brain 02/22/2021 shows multiple T2/FLAIR hyperintense foci in the hemispheres in a pattern consistent with multiple sclerosis.  2 foci on the left show subtle enhancement after contrast.  MRI of the orbits 02/22/2021 shows enhancement  of the left optic nerve.  MRI of the cervical spine 02/23/2021 limited by movement artifact shows a normal spinal cord and mild multilevel degenerative changes.  MRI of the thoracic spine 02/23/2021 limited by movement artifact shows a normal spinal cord and degenerative changes with mild spinal stenosis at T11-T12 and T12-L1.   Laboratory tests March 2022:   Anti-NMO and anti-MOG are negative.  B12 low at 152 (180-914).   EKG normal 2/.2021  REVIEW OF SYSTEMS: Constitutional: No fevers, chills, sweats, or change in appetite Eyes: No visual changes, double vision, eye pain Ear, nose and throat: No hearing loss, ear pain, nasal congestion, sore throat Cardiovascular: No chest pain, palpitations Respiratory:  No shortness of breath at rest or with exertion.   No wheezes GastrointestinaI: No nausea, vomiting, diarrhea, abdominal pain, fecal incontinence Genitourinary:  No dysuria, urinary retention or frequency.  No nocturia. Musculoskeletal:  No neck pain, back pain Integumentary: No rash, pruritus, skin lesions Neurological: as above Psychiatric: No depression at this time.  No anxiety Endocrine: No palpitations, diaphoresis, change in appetite, change in weigh or increased thirst Hematologic/Lymphatic:  No anemia, purpura, petechiae. Allergic/Immunologic: No itchy/runny eyes, nasal congestion, recent allergic reactions, rashes  ALLERGIES: Allergies  Allergen Reactions   Nsaids Other (See Comments)    HX. OF WEIGHT-LOSS SURGERY    Sulfa Antibiotics Rash   Tramadol Rash    Had after 2014 and tolerated okay    HOME MEDICATIONS:  Current Outpatient Medications:    ferrous sulfate  325 (65 FE) MG tablet, Take 325 mg by mouth daily with breakfast., Disp: , Rfl:    modafinil  (PROVIGIL ) 200 MG tablet, Take 1 tablet (200 mg total) by mouth daily., Disp: 30 tablet, Rfl: 5   NIFEdipine  (PROCARDIA -XL/NIFEDICAL-XL) 30 MG 24 hr tablet, Take 30 mg by mouth daily., Disp: , Rfl:     nortriptyline  (PAMELOR ) 25 MG capsule, Take 1 capsule (25 mg total) by mouth at bedtime., Disp: 30 capsule, Rfl: 5   oxybutynin  (DITROPAN ) 5 MG tablet, one po tid, Disp: 270 tablet, Rfl: 3   sertraline  (ZOLOFT ) 50 MG tablet, TAKE 1 TABLET BY MOUTH EVERY DAY, Disp: 90 tablet, Rfl: 3   Vitamin D , Ergocalciferol , (DRISDOL ) 1.25 MG (50000 UNIT) CAPS capsule, Take 1 capsule (50,000 Units total) by mouth every 7 (seven) days., Disp: 13 capsule, Rfl: 1   ZEPOSIA  0.92 MG CAPS, TAKE 1 CAPSULE BY MOUTH 1 TIME A DAY, Disp: 90 capsule, Rfl: 0   Cholecalciferol (VITAMIN D ) 125 MCG (  5000 UT) CAPS, Take 10,000 Units by mouth daily., Disp: , Rfl:    Copper  Gluconate (COPPER  CAPS) 2 MG CAPS, Take 2 mg by mouth daily., Disp: , Rfl:    cyanocobalamin  (VITAMIN B12) 1000 MCG/ML injection, INJECT 1 ML INTO THE SKIN EVERY 4 WEEKS, Disp: 3 mL, Rfl: 1   famotidine  (PEPCID ) 20 MG tablet, Take 1 tablet (20 mg total) by mouth 2 (two) times daily as needed for heartburn. (Patient not taking: Reported on 05/28/2023), Disp: 60 tablet, Rfl: 0   hydrocortisone  (ANUSOL -HC) 2.5 % rectal cream, Place 1 Application rectally daily as needed for hemorrhoids or anal itching. (Patient not taking: Reported on 12/17/2023), Disp: , Rfl:    methylPREDNISolone  (MEDROL ) 4 MG tablet, Taper from 6 pills po for one day to 1 pill po the last day over 6 days (Patient not taking: Reported on 12/17/2023), Disp: 21 tablet, Rfl: 0   Multiple Vitamin (MULTIVITAMIN WITH MINERALS) TABS tablet, Take 1 tablet by mouth daily. (Patient not taking: Reported on 12/17/2023), Disp: 30 tablet, Rfl: 0   nicotine  (NICODERM CQ  - DOSED IN MG/24 HOURS) 14 mg/24hr patch, Place 14 mg onto the skin daily. (Patient not taking: Reported on 05/28/2023), Disp: , Rfl:    nicotine  (NICODERM CQ ) 7 mg/24hr patch, Place 1 patch (7 mg total) onto the skin daily. (Patient not taking: Reported on 01/23/2023), Disp: 28 patch, Rfl: 1   nicotine  polacrilex (NICORETTE ) 2 MG gum, TAKE 1 EACH (2 MG)  BY MOUTH AS NEEDED FOR SMOKING CESSATION (Patient not taking: No sig reported), Disp: 100 each, Rfl: 1   NIFEdipine  (ADALAT  CC) 30 MG 24 hr tablet, Take 1 tablet (30 mg total) by mouth daily. (Patient not taking: Reported on 05/28/2023), Disp: 30 tablet, Rfl: 0   nystatin ointment (MYCOSTATIN), Apply 1 Application topically daily as needed (Rash). (Patient not taking: Reported on 12/17/2023), Disp: , Rfl:    Prenat-FeCbn-FeAsp-Meth-FA-DHA (PRENATE MINI) 18-0.6-0.4-350 MG CAPS, Take 1 capsule by mouth daily. (Patient not taking: Reported on 05/28/2023), Disp: , Rfl:   PAST MEDICAL HISTORY: Past Medical History:  Diagnosis Date   Arthritis of low back    Complication of anesthesia    is of [place] descent   Dysrhythmia    hx PAT   Family history of adverse reaction to anesthesia    pt is a [place]   GERD (gastroesophageal reflux disease)    none since pregnancy    Heart murmur    When pt. was pregnant with her last child, and also had murmur as a child.     Hemorrhoids    History of anal fissures    History of PAT (paroxysmal atrial tachycardia)    had cardiac ablation as a teenager   History of sleep apnea    none since 450 pound weight loss in 2015   Migraines    MS (multiple sclerosis) (HCC)    PCOD (polycystic ovarian disease)    no menses    PAST SURGICAL HISTORY: Past Surgical History:  Procedure Laterality Date   BREAST LUMPECTOMY Left 2011   papilloma   BREAST LUMPECTOMY Left 2015   papilloma   BREAST LUMPECTOMY WITH RADIOACTIVE SEED LOCALIZATION Left 10/18/2014   Procedure:  RADIOACTIVE SEED GUIDED EXCISIONAL BREAST BIOPSY;  Surgeon: Juanita Norlander, MD;  Location: [facility];  Service: General;  Laterality: Left;   BREAST SURGERY Right 07/27/2004   retro-areolar dissection with exc. of large duct   CARDIAC ELECTROPHYSIOLOGY MAPPING AND ABLATION  [age]ge 13  CARDIAC ELECTROPHYSIOLOGY STUDY AND ABLATION  age 83   CESAREAN SECTION N/A 03/28/2020   Procedure:  CESAREAN SECTION;  Surgeon: Gaspar Karma, MD;  Location: [facility];  Service: Obstetrics;  Laterality: N/A;  RNFA Tracey   CESAREAN SECTION  03/2018   CESAREAN SECTION WITH BILATERAL TUBAL LIGATION Bilateral 12/27/2022   Procedure: CESAREAN SECTION WITH BILATERAL TUBAL LIGATION;  Surgeon: Gaspar Karma, MD;  Location: [facility];  Service: Obstetrics;  Laterality: Bilateral;  incr r/o malignant hyperthermia with isoflurane anesthetic induction as patient is [place]   CHOLECYSTECTOMY  04/27/2008   EVALUATION UNDER ANESTHESIA WITH ANAL FISTULECTOMY N/A 02/10/2023   Procedure: CONTROL OF ANAL FISTULAS WITH PLACEMENT OF SETON AND ANORECTAL EXAM UNDER ANESTHESIA;  Surgeon: Melvenia Stabs, MD;  Location: [facility];  Service: General;  Laterality: N/A;   GASTROPLASTY DUODENAL SWITCH  01/2014   done at [facility]   INCISION AND DRAINAGE ABSCESS Bilateral 10/11/2022   Procedure: Incision and drainage of Bilateral Perirectoal abscess, Possible Seton;  Surgeon: Enid Harry, MD;  Location[facility];  Service: General;  Laterality: Bilateral;   INCISION AND DRAINAGE PERIRECTAL ABSCESS N/A 08/15/2020   Procedure: ANORECTAL EXAM UNDER ANESTHESIA INCISION AND DRAINAGE OF PERIRECTAL ABSCESS x2 WITH PLACEMENT OF SETON;  Surgeon: Melvenia Stabs, MD;  Location: [facility];  Service: General;  Laterality: N/A;   OVARIAN CYST REMOVAL Bilateral 1999   PLACEMENT OF SETON  08/15/2020   PLACEMENT OF SETON N/A 12/08/2020   Procedure: PLACEMENT OF CUTTING SETON; PARTIAL FISTULOTOMY;  Surgeon: Melvenia Stabs, MD;  Location: [facility];  Service: General;  Laterality: N/A;   RECTAL EXAM UNDER ANESTHESIA N/A 12/08/2020   Procedure: ANORECTAL EXAM UNDER ANESTHESIA;  Surgeon: Melvenia Stabs, MD;  Location: [facility];  Service: General;  Laterality: N/A;   TOENAIL EXCISION  yrs ago   UNILATERAL SALPINGECTOMY      FAMILY HISTORY: Family History  Problem Relation Age of  Onset   Hypertension Mother    Diabetes Mother    Hypertension Father    Hyperlipidemia Father    Cancer Maternal Grandmother        multiple myaloma, breast cancer   Heart disease Maternal Grandmother    Diabetes Maternal Grandfather    Cancer Paternal Grandfather        pancreactic   Colon cancer Neg Hx    Stomach cancer Neg Hx    Rectal cancer Neg Hx    Esophageal cancer Neg Hx     SOCIAL HISTORY:  Social History   Socioeconomic History   Marital status: Married    Spouse name: Bearl Limes   Number of children: 2   Years of education: 12   Highest education level: Not on file  Occupational History   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    Lives with husband  Caffeine use: 1 cup per day of tea   Social Drivers of Corporate investment banker Strain: Not on file  Food Insecurity: No Food Insecurity (12/30/2022)   Hunger Vital Sign    Worried About Running Out of Food in the Last Year: Never true    Ran Out of Food in the Last Year: Never true  Transportation Needs: No Transportation Needs (12/30/2022)   PRAPARE - Administrator, Civil Service (Medical): No    Lack of Transportation (Non-Medical): No  Physical Activity: Not on file  Stress: Not on file  Social Connections: Unknown (04/01/2022)   [facility] from [facility]   Social Network    Social Network: Not on file  Intimate Partner Violence: Not At Risk (12/27/2022)   Humiliation,  Afraid, Rape, and Kick questionnaire    Fear of Current or Ex-Partner: No    Emotionally Abused: No    Physically Abused: No    Sexually Abused: No     PHYSICAL EXAM  Vitals:   12/17/23 1608  BP: 129/76  Pulse: 65  Weight: 267 lb (121.1 kg)  Height: 5\' 11"  (1.803 m)    Body mass index is 37.24 kg/m.   No results found.   General: The patient is well-developed and well-nourished and in no acute distress  HEENT:  Head is [place]/AT.  Sclera are anicteric.    Skin: Extremities are without rash or edema.  Neurologic Exam  Mental status: The patient is alert and oriented x 3 at the time of the examination. The patient has apparent normal recent and remote memory, with an apparently normal attention span and concentration ability.   Speech is normal.  Cranial nerves: Extraocular movements are full.  Reduced color vision OS.  Visual acuity is fairly symmetrical.  There is good facial sensation to soft touch bilaterally.Facial strength is normal.  No obvious hearing deficits are noted.  Motor:  Muscle bulk is normal.   Tone is normal. Strength is  5 / 5 in all 4 extremities.   Sensory: Sensory testing is intact to pinprick, soft touch and vibration sensation in all 4 extremities.  Coordination: Cerebellar testing reveals good finger-nose-finger and heel-to-shin bilaterally.  Gait and station: Station is normal.   Gait is normal.  Her tandem gait is mildly wide.  Reflexes: Deep tendon reflexes are symmetric and normal bilaterally.      DIAGNOSTIC DATA (LABS, IMAGING, TESTING) - I reviewed patient records, labs, notes, testing and imaging myself where available.  Lab Results  Component Value Date   WBC 4.4 05/28/2023   HGB 12.2 05/28/2023   HCT 37.8 05/28/2023   MCV 84 05/28/2023   PLT 222 05/28/2023      Component Value Date/Time   NA 142 05/28/2023 1600   K 4.2 05/28/2023 1600   CL 109 (H) 05/28/2023 1600   CO2 21 05/28/2023 1600   GLUCOSE 77 05/28/2023 1600    GLUCOSE 71 01/27/2023 1314   BUN 7 05/28/2023 1600   CREATININE 0.53 (L) 05/28/2023 1600   CREATININE 0.62 03/03/2020 1452   CALCIUM  8.7 05/28/2023 1600   PROT 6.1 05/28/2023 1600   ALBUMIN 3.9 05/28/2023 1600   AST 13 05/28/2023 1600   AST 10 (L) 03/03/2020 1452   ALT 12 05/28/2023 1600   ALT 10 03/03/2020 1452   ALKPHOS 57 05/28/2023 1600   BILITOT 0.4 05/28/2023 1600   BILITOT 0.4 03/03/2020 1452   GFRNONAA >60 01/27/2023 1314   GFRNONAA >60 03/03/2020  1452   GFRAA >60 08/15/2020 0354   GFRAA >60 03/03/2020 1452   No results found for: "CHOL", "HDL", "LDLCALC", "LDLDIRECT", "TRIG", "CHOLHDL" Lab Results  Component Value Date   HGBA1C 4.6 (L) 11/05/2017   Lab Results  Component Value Date   VITAMINB12 449 05/28/2023   Lab Results  Component Value Date   TSH 1.620 05/28/2023       ASSESSMENT AND PLAN  Multiple sclerosis (HCC) - Plan: CBC with Differential/Platelet, MR BRAIN W WO CONTRAST  High risk medication use - Plan: CBC with Differential/Platelet  Vitamin D  deficiency - Plan: VITAMIN D  25 Hydroxy (Vit-D Deficiency, Fractures)  Copper  deficiency  S/P bariatric surgery  B12 deficiency  Other fatigue   Continue  Zeposia  and check labs check MRI of the brain to determine if there is subclinical progression.  If this is occurring consider changing to one of the anti-CD20 agents or natalizumab Stay active and exercise as tolerated.   Continue Vit D, B12 and copper . .   Modafinil  for MS related fatigue.   Rtc 6 months or call sooner if new or worsening issues.     Estrellita Lasky A. Godwin Lat, MD, [facility] 12/17/2023, 5:28 PM Certified in Neurology, Clinical Neurophysiology, Sleep Medicine and Neuroimaging  [facility] [address], [address] [place], [place] [address] [phone]

## 2023-12-18 ENCOUNTER — Other Ambulatory Visit: Payer: Self-pay | Admitting: Neurology

## 2023-12-18 LAB — CBC WITH DIFFERENTIAL/PLATELET
Basophils Absolute: 0 10*3/uL (ref 0.0–0.2)
Basos: 0 %
EOS (ABSOLUTE): 0.1 10*3/uL (ref 0.0–0.4)
Eos: 2 %
Hematocrit: 39.9 % (ref 34.0–46.6)
Hemoglobin: 12.6 g/dL (ref 11.1–15.9)
Immature Grans (Abs): 0 10*3/uL (ref 0.0–0.1)
Immature Granulocytes: 0 %
Lymphocytes Absolute: 1.3 10*3/uL (ref 0.7–3.1)
Lymphs: 25 %
MCH: 27.1 pg (ref 26.6–33.0)
MCHC: 31.6 g/dL (ref 31.5–35.7)
MCV: 86 fL (ref 79–97)
Monocytes Absolute: 0.5 10*3/uL (ref 0.1–0.9)
Monocytes: 10 %
Neutrophils Absolute: 3.1 10*3/uL (ref 1.4–7.0)
Neutrophils: 63 %
Platelets: 207 10*3/uL (ref 150–450)
RBC: 4.65 x10E6/uL (ref 3.77–5.28)
RDW: 12.5 % (ref 11.7–15.4)
WBC: 5 10*3/uL (ref 3.4–10.8)

## 2023-12-18 LAB — VITAMIN D 25 HYDROXY (VIT D DEFICIENCY, FRACTURES): Vit D, 25-Hydroxy: 20.1 ng/mL — ABNORMAL LOW (ref 30.0–100.0)

## 2023-12-18 MED ORDER — VITAMIN D (ERGOCALCIFEROL) 1.25 MG (50000 UNIT) PO CAPS: 50000.0000 [IU] | ORAL_CAPSULE | ORAL | 3 refills | Status: DC

## 2023-12-23 ENCOUNTER — Encounter: Payer: Self-pay | Admitting: Neurology

## 2023-12-23 ENCOUNTER — Other Ambulatory Visit: Payer: Self-pay | Admitting: Neurology

## 2023-12-23 ENCOUNTER — Telehealth: Payer: Self-pay | Admitting: Neurology

## 2023-12-23 MED ORDER — ALPRAZOLAM 0.5 MG PO TABS: ORAL_TABLET | ORAL | 0 refills | Status: DC

## 2023-12-23 NOTE — Telephone Encounter (Signed)
Lynn Roberts: [id number] exp. 12/23/23-06/20/24 sent to [name]. [phone]

## 2023-12-31 ENCOUNTER — Encounter: Payer: Self-pay | Admitting: Hematology and Oncology

## 2023-12-31 NOTE — Telephone Encounter (Signed)
[employer] Berkley Harvey: [id number] exp. 12/31/23-02/28/24  Her last name in [employer] is [employer]

## 2024-01-02 ENCOUNTER — Encounter: Payer: Self-pay | Admitting: Hematology and Oncology

## 2024-01-03 ENCOUNTER — Ambulatory Visit (HOSPITAL_COMMUNITY)
Admission: RE | Admit: 2024-01-03 | Discharge: 2024-01-03 | Disposition: A | Discharge status: Home / Self Care | Payer: No Typology Code available for payment source | Source: Ambulatory Visit | Attending: Neurology | Admitting: Neurology

## 2024-01-03 DIAGNOSIS — G35 Multiple sclerosis: Secondary | ICD-10-CM | POA: Diagnosis present

## 2024-01-03 MED ORDER — GADOBUTROL 1 MMOL/ML IV SOLN
10.0000 mL | Freq: Once | INTRAVENOUS | Status: AC | PRN
  Administered 2024-01-03: 10 mL via INTRAVENOUS

## 2024-01-11 ENCOUNTER — Encounter (HOSPITAL_COMMUNITY): Payer: Self-pay

## 2024-01-11 ENCOUNTER — Other Ambulatory Visit: Payer: Self-pay | Admitting: Neurology

## 2024-01-11 ENCOUNTER — Ambulatory Visit (HOSPITAL_COMMUNITY)
Admission: EM | Admit: 2024-01-11 | Discharge: 2024-01-11 | Disposition: A | Discharge status: Home / Self Care | Payer: No Typology Code available for payment source | Attending: Physician Assistant | Admitting: Physician Assistant

## 2024-01-11 DIAGNOSIS — R6889 Other general symptoms and signs: Secondary | ICD-10-CM | POA: Diagnosis not present

## 2024-01-11 DIAGNOSIS — R110 Nausea: Secondary | ICD-10-CM

## 2024-01-11 MED ORDER — ONDANSETRON 4 MG PO TBDP: 4.0000 mg | ORAL_TABLET | Freq: Three times a day (TID) | ORAL | 0 refills | Status: DC | PRN

## 2024-01-11 NOTE — ED Provider Notes (Signed)
 [facility]    CSN: [id number] Arrival date & time: 01/11/24  1104      History   Chief Complaint Chief Complaint  Patient presents with   Headache   Nasal Congestion   Cough   Nausea    HPI Lynn Roberts is a 38 y.o. female.   HPI   She reports on Tuesday she started her period and had mildly upset stomach From this time she started to develop headache, sinus pressure, cough, sore throat, fatigue   She reports she does have a hx of MS  She reports she has been taking Robitussin and tylenol  for her symptoms   She states her husband found out on Thurs that he had the Flu and last week her children had a stomach virus    Past Medical History:  Diagnosis Date   Arthritis of low back    Complication of anesthesia    is of [place] descent   Dysrhythmia    hx PAT   Family history of adverse reaction to anesthesia    pt is a [place] ind[place]   GERD (gastroesophageal reflux disease)    none since pregnancy    Heart murmur    When pt. was pregnant with her last child, and also had murmur as a child.     Hemorrhoids    History of anal fissures    History of PAT (paroxysmal atrial tachycardia)    had cardiac ablation as a teenager   History of sleep apnea    none since 450 pound weight loss in 2015   Migraines    MS (multiple sclerosis) (HCC)    PCOD (polycystic ovarian disease)    no menses    Patient Active Problem List   Diagnosis Date Noted   History of cesarean section 12/27/2022   Tobacco abuse 07/10/2022   [redacted] weeks gestation of pregnancy 06/05/2022   Multiple sclerosis exacerbation (HCC) 05/15/2021   High risk medication use 05/15/2021   Vitamin D  deficiency 05/15/2021   Copper  deficiency 05/15/2021   B12 deficiency 05/15/2021   Migraine with aura and without status migrainosus, not intractable 05/15/2021   Optic neuritis 02/22/2021   Multiple sclerosis (HCC) 02/22/2021   Hypokalemia 02/22/2021   Perirectal abscess 08/14/2020   Placenta  previa before labor and cesarean delivery without hemorrhage 03/28/2020   Iron deficiency anemia due to chronic blood loss 03/03/2020   S/P bariatric surgery 09/11/2017   Papilloma of breast, left 06/08/2014   Back pain, chronic 08/10/2013   Diverticulosis 08/10/2013   Osteoarthritis of knee 08/10/2013   Sleep apnea 08/10/2013    Past Surgical History:  Procedure Laterality Date   BREAST LUMPECTOMY Left 2011   papilloma   BREAST LUMPECTOMY Left 2015   papilloma   BREAST LUMPECTOMY WITH RADIOACTIVE SEED LOCALIZATION Left 10/18/2014   Procedure:  RADIOACTIVE SEED GUIDED EXCISIONAL BREAST BIOPSY;  Surgeon: Alm Angle, MD;  Location: [facility];  Service: General;  Laterality: Left;   BREAST SURGERY Right 07/27/2004   retro-areolar dissection with exc. of large duct   CARDIAC ELECTROPHYSIOLOGY MAPPING AND ABLATION  [age]ge 13   CARDIAC ELECTROPHYSIOLOGY STUDY AND ABLATION  age 62   CESAREAN SECTION N/A 03/28/2020   Procedure: CESAREAN SECTION;  Surgeon: Sudie Lavonia HERO, MD;  Location: [facility];  Service: Obstetrics;  Laterality: N/A;  RNFA Tracey   CESAREAN SECTION  03/2018   CESAREAN SECTION WITH BILATERAL TUBAL LIGATION Bilateral 12/27/2022   Procedure: CESAREAN SECTION WITH BILATERAL TUBAL  LIGATION;  Surgeon: Sudie Lavonia HERO, MD;  Location: [facility];  Service: Obstetrics;  Laterality: Bilateral;  incr r/o malignant hyperthermia with isoflurane anesthetic induction as patient is [place]   CHOLECYSTECTOMY  04/27/2008   EVALUATION UNDER ANESTHESIA WITH ANAL FISTULECTOMY N/A 02/10/2023   Procedure: CONTROL OF ANAL FISTULAS WITH PLACEMENT OF SETON AND ANORECTAL EXAM UNDER ANESTHESIA;  Surgeon: Teresa Lonni HERO, MD;  Location: [facility];  Service: General;  Laterality: N/A;   GASTROPLASTY DUODENAL SWITCH  01/2014   done at [facility]   INCISION AND DRAINAGE ABSCESS Bilateral 10/11/2022   Procedure: Incision and drainage of Bilateral Perirectoal abscess, Possible Seton;   Surgeon: Ebbie Cough, MD;  Location[facility];  Service: General;  Laterality: Bilateral;   INCISION AND DRAINAGE PERIRECTAL ABSCESS N/A 08/15/2020   Procedure: ANORECTAL EXAM UNDER ANESTHESIA INCISION AND DRAINAGE OF PERIRECTAL ABSCESS x2 WITH PLACEMENT OF SETON;  Surgeon: Teresa Lonni HERO, MD;  Location: [facility];  Service: General;  Laterality: N/A;   OVARIAN CYST REMOVAL Bilateral 1999   PLACEMENT OF SETON  08/15/2020   PLACEMENT OF SETON N/A 12/08/2020   Procedure: PLACEMENT OF CUTTING SETON; PARTIAL FISTULOTOMY;  Surgeon: Teresa Lonni HERO, MD;  Location: [facility];  Service: General;  Laterality: N/A;   RECTAL EXAM UNDER ANESTHESIA N/A 12/08/2020   Procedure: ANORECTAL EXAM UNDER ANESTHESIA;  Surgeon: Teresa Lonni HERO, MD;  Location: [facility];  Service: General;  Laterality: N/A;   TOENAIL EXCISION  yrs ago   UNILATERAL SALPINGECTOMY      OB History     Gravida  4   Para  3   Term  2   Preterm  1   AB  1   Living  3      SAB  1   IAB      Ectopic      Multiple  0   Live Births  3            Home Medications    Prior to Admission medications   Medication Sig Start Date End Date Taking? Authorizing Provider  ondansetron  (ZOFRAN -ODT) 4 MG disintegrating tablet Take 1 tablet (4 mg total) by mouth every 8 (eight) hours as needed for nausea or vomiting. 01/11/24  Yes Miquan Tandon E, PA-C  ALPRAZolam  (XANAX ) 0.5 MG tablet Take 1-2 tablets by mouth about 30 min prior to MRI. Can take additional tablet at time of test if needed. Must have driver to and from test, can cause drowsiness. 12/23/23   Sater, Charlie LABOR, MD  Cholecalciferol (VITAMIN D ) 125 MCG (5000 UT) CAPS Take 10,000 Units by mouth daily.    [provider]  Copper  Gluconate (COPPER  CAPS) 2 MG CAPS Take 2 mg by mouth daily.    [provider]  cyanocobalamin  (VITAMIN B12) 1000 MCG/ML injection INJECT 1 ML INTO THE SKIN EVERY 4 WEEKS 06/30/23    Sater, Charlie LABOR, MD  famotidine  (PEPCID ) 20 MG tablet Take 1 tablet (20 mg total) by mouth 2 (two) times daily as needed for heartburn. Patient not taking: Reported on 05/28/2023 10/13/22   Gretta Gums, MD  ferrous sulfate  325 (65 FE) MG tablet Take 325 mg by mouth daily with breakfast.    [provider]  hydrocortisone  (ANUSOL -HC) 2.5 % rectal cream Place 1 Application rectally daily as needed for hemorrhoids or anal itching. Patient not taking: Reported on 12/17/2023    [provider]  methylPREDNISolone  (MEDROL ) 4 MG tablet Taper from 6  pills po for one day to 1 pill po the last day over 6 days Patient not taking: Reported on 12/17/2023 06/04/23   Sater, Charlie LABOR, MD  modafinil  (PROVIGIL ) 200 MG tablet Take 1 tablet (200 mg total) by mouth daily. 12/17/23   Sater, Charlie LABOR, MD  Multiple Vitamin (MULTIVITAMIN WITH MINERALS) TABS tablet Take 1 tablet by mouth daily. Patient not taking: Reported on 12/17/2023 02/26/21   Lue Elsie BROCKS, MD  nicotine  (NICODERM CQ  - DOSED IN MG/24 HOURS) 14 mg/24hr patch Place 14 mg onto the skin daily. Patient not taking: Reported on 05/28/2023    [provider]  nicotine  (NICODERM CQ ) 7 mg/24hr patch Place 1 patch (7 mg total) onto the skin daily. Patient not taking: Reported on 01/23/2023 07/10/22   Barbaraann Darryle Ned, MD  nicotine  polacrilex (NICORETTE ) 2 MG gum TAKE 1 EACH (2 MG) BY MOUTH AS NEEDED FOR SMOKING CESSATION Patient not taking: No sig reported 01/14/23   Barbaraann Darryle Ned, MD  NIFEdipine  (ADALAT  CC) 30 MG 24 hr tablet Take 1 tablet (30 mg total) by mouth daily. Patient not taking: Reported on 05/28/2023 12/29/22   Clark, Dyanna, MD  NIFEdipine  (PROCARDIA -XL/NIFEDICAL-XL) 30 MG 24 hr tablet Take 30 mg by mouth daily. 01/13/23   [provider]  nortriptyline  (PAMELOR ) 25 MG capsule Take 1 capsule (25 mg total) by mouth at bedtime. 06/04/23   Sater, Charlie LABOR, MD  nystatin ointment (MYCOSTATIN) Apply 1  Application topically daily as needed (Rash). Patient not taking: Reported on 12/17/2023 12/06/22   [provider]  oxybutynin  (DITROPAN ) 5 MG tablet one po tid 12/17/23   Sater, Charlie LABOR, MD  Prenat-FeCbn-FeAsp-Meth-FA-DHA (PRENATE MINI) 18-0.6-0.4-350 MG CAPS Take 1 capsule by mouth daily. Patient not taking: Reported on 05/28/2023 05/30/22   [provider]  sertraline  (ZOLOFT ) 50 MG tablet TAKE 1 TABLET BY MOUTH EVERY DAY 11/15/22   Sater, Charlie LABOR, MD  Vitamin D , Ergocalciferol , (DRISDOL ) 1.25 MG (50000 UNIT) CAPS capsule Take 1 capsule (50,000 Units total) by mouth every 7 (seven) days. 12/18/23   Sater, Charlie LABOR, MD  ZEPOSIA  0.92 MG CAPS TAKE 1 CAPSULE BY MOUTH 1 TIME A DAY 12/01/23   Sater, Charlie LABOR, MD    Family History Family History  Problem Relation Age of Onset   Hypertension Mother    Diabetes Mother    Hypertension Father    Hyperlipidemia Father    Cancer Maternal Grandmother        multiple myaloma, breast cancer   Heart disease Maternal Grandmother    Diabetes Maternal Grandfather    Cancer Paternal Grandfather        pancreactic   Colon cancer Neg Hx    Stomach cancer Neg Hx    Rectal cancer Neg Hx    Esophageal cancer Neg Hx     Social History Social History   Tobacco Use   Smoking status: Every Day    Current packs/day: 0.50    Average packs/day: 0.5 packs/day for 10.0 years (5.0 ttl pk-yrs)    Types: Cigarettes   Smokeless tobacco: Never   Tobacco comments:    4-5 cigs per day  Vaping Use   Vaping status: Never Used  Substance Use Topics   Alcohol use: No   Drug use: No     Allergies   Nsaids, Sulfa antibiotics, and Tramadol   Review of Systems Review of Systems  Constitutional:  Positive for chills and fatigue. Negative for fever.  HENT:  Positive for congestion,  postnasal drip and sore throat. Negative for ear pain, sinus pressure and sinus pain.   Respiratory:  Positive for cough. Negative for shortness of breath and  wheezing.   Gastrointestinal:  Positive for diarrhea (On Tues and Wed) and nausea. Negative for vomiting.  Musculoskeletal:  Positive for myalgias.  Neurological:  Positive for headaches.     Physical Exam Triage Vital Signs ED Triage Vitals  Encounter Vitals Group     BP 01/11/24 1211 132/83     Systolic BP Percentile --      Diastolic BP Percentile --      Pulse Rate 01/11/24 1211 77     Resp 01/11/24 1211 16     Temp 01/11/24 1211 99.2 F (37.3 C)     Temp Source 01/11/24 1211 Oral     SpO2 01/11/24 1211 96 %     Weight --      Height --      Head Circumference --      Peak Flow --      Pain Score 01/11/24 1210 8     Pain Loc --      Pain Education --      Exclude from Growth Chart --    No data found.  Updated Vital Signs BP 132/83 (BP Location: Left Arm)   Pulse 77   Temp 99.2 F (37.3 C) (Oral)   Resp 16   LMP 01/06/2024   SpO2 96%   Visual Acuity Right Eye Distance:   Left Eye Distance:   Bilateral Distance:    Right Eye Near:   Left Eye Near:    Bilateral Near:     Physical Exam Vitals reviewed.  Constitutional:      General: She is awake.     Appearance: Normal appearance. She is well-developed and well-groomed.  HENT:     Head: Normocephalic and atraumatic.     Right Ear: Hearing, tympanic membrane and ear canal normal.     Left Ear: Hearing, tympanic membrane and ear canal normal.     Mouth/Throat:     Lips: Pink.     Mouth: Mucous membranes are moist.     Pharynx: Oropharynx is clear. Uvula midline. No pharyngeal swelling, oropharyngeal exudate, posterior oropharyngeal erythema, uvula swelling or postnasal drip.  Cardiovascular:     Rate and Rhythm: Normal rate and regular rhythm.     Pulses: Normal pulses.          Radial pulses are 2+ on the right side and 2+ on the left side.     Heart sounds: Normal heart sounds. No murmur heard.    No friction rub. No gallop.  Pulmonary:     Effort: Pulmonary effort is normal.     Breath sounds:  Normal breath sounds. No decreased air movement. No decreased breath sounds, wheezing, rhonchi or rales.  Musculoskeletal:     Cervical back: Normal range of motion and neck supple.  Lymphadenopathy:     Head:     Right side of head: No submental, submandibular or preauricular adenopathy.     Left side of head: No submental, submandibular or preauricular adenopathy.     Cervical:     Right cervical: No superficial cervical adenopathy.    Left cervical: No superficial cervical adenopathy.     Upper Body:     Right upper body: No supraclavicular adenopathy.     Left upper body: No supraclavicular adenopathy.  Skin:    General: Skin is warm and dry.  Neurological:     Mental Status: She is alert.  Psychiatric:        Mood and Affect: Mood normal.        Speech: Speech normal.        Behavior: Behavior normal. Behavior is cooperative.      UC Treatments / Results  Labs (all labs ordered are listed, but only abnormal results are displayed) Labs Reviewed - No data to display  EKG   Radiology No results found.  Procedures Procedures (including critical care time)  Medications Ordered in UC Medications - No data to display  Initial Impression / Assessment and Plan / UC Course  I have reviewed the triage vital signs and the nursing notes.  Pertinent labs & imaging results that were available during my care of the patient were reviewed by me and considered in my medical decision making (see chart for details).      Final Clinical Impressions(s) / UC Diagnoses   Final diagnoses:  Nausea  Flu-like symptoms   Acute, new concern Patient reports that she has developed headache, sinus pressure, coughing since earlier in the week.  She also reports that her husband has tested positive for the flu and her children were diagnosed with a stomach virus last week.  At this time I am more suspicious for viral URI or the flu.  Point-of-care testing is not available for flu or COVID  in the clinic today.  Since she is past 48-hour window for Tamiflu this would not had to work up.  Physical exam and vitals are  reassuring today.  At this time recommend symptomatic management with over-the-counter medications.  Will send in Zofran  as needed for nausea and vomiting. Reviewed that she should follow-up with her MS provider for further recommendations regarding acute illness in the setting of multiple sclerosis. ED and return precautions reviewed and provided in after visit summary.  Follow-up as needed for progressing or persistent symptoms.     Discharge Instructions       The goal of treatment at this time is to reduce your symptoms and discomfort   You can use over the counter medications such as Dayquil/Nyquil, AlkaSeltzer formulations, etc to provide further relief of symptoms according to the manufacturer's instructions   Please make sure that you are increasing your fluid intake and staying well-hydrated.  If you do not feel like eating you can try supplementing with meal replacement shakes/drinks such as Ensure or you can use protein smoothies or Gatorade/Pedialyte as desired.  If your symptoms do not improve or become worse in the next 5-7 days follow-up with your PCP or return to urgent care. Go to the ER if you begin to have more serious symptoms such as shortness of breath, trouble breathing, loss of consciousness, swelling around the eyes, high fever, severe lasting headaches, vision changes or neck pain/stiffness.       ED Prescriptions     Medication Sig Dispense Auth. Provider   ondansetron  (ZOFRAN -ODT) 4 MG disintegrating tablet Take 1 tablet (4 mg total) by mouth every 8 (eight) hours as needed for nausea or vomiting. 20 tablet Raoul Ciano E, PA-C      PDMP not reviewed this encounter.   Jette Lewan, Rocky BRAVO, PA-C 01/11/24 1332

## 2024-01-11 NOTE — ED Triage Notes (Signed)
 Patient reports that she has had a headache, sinus pressure, nasal congestion, a productive cough with brown sputum, and nausea since yesterday.  Patient states she has been taking Robitussin and Tylenol  and the last dose of Tylenol  was at 0500 today.

## 2024-01-11 NOTE — Discharge Instructions (Addendum)
  The goal of treatment at this time is to reduce your symptoms and discomfort   You can use over the counter medications such as Dayquil/Nyquil, AlkaSeltzer formulations, etc to provide further relief of symptoms according to the manufacturer's instructions   Please make sure that you are increasing your fluid intake and staying well-hydrated.  If you do not feel like eating you can try supplementing with meal replacement shakes/drinks such as Ensure or you can use protein smoothies or Gatorade/Pedialyte as desired.  If your symptoms do not improve or become worse in the next 5-7 days follow-up with your PCP or return to urgent care. Go to the ER if you begin to have more serious symptoms such as shortness of breath, trouble breathing, loss of consciousness, swelling around the eyes, high fever, severe lasting headaches, vision changes or neck pain/stiffness.

## 2024-01-12 ENCOUNTER — Telehealth (HOSPITAL_COMMUNITY): Payer: Self-pay

## 2024-01-12 ENCOUNTER — Encounter: Payer: Self-pay | Admitting: Neurology

## 2024-01-15 MED ORDER — BACLOFEN 10 MG PO TABS: 10.0000 mg | ORAL_TABLET | Freq: Three times a day (TID) | ORAL | 0 refills | Status: DC | PRN

## 2024-01-15 NOTE — Telephone Encounter (Signed)
Took call from phone room and spoke w/ pt. She reports she went back to work today. Feeling a lot better post flu. She has lingering cough. Taking Nyquil/Dayquil/tylenol prn. However, she left work early today d/t her having muscle spasms in fingers/fingers drawing up and having cramps in L leg. She has tried flexeril in the past for back issues but no other muscle relaxers tried in the past. I placed her on hold and spoke w/ MD. He recommends trying baclofen 10mg , 1 po TID prn #30, 0 refills. I relayed to pt. She is agreeable to try this. I e[facility]scribed to [facility] on file. She will call back if ineffective.

## 2024-01-23 ENCOUNTER — Encounter: Payer: Self-pay | Admitting: Neurology

## 2024-02-03 ENCOUNTER — Telehealth: Payer: Self-pay | Admitting: Pharmacy Technician

## 2024-02-03 ENCOUNTER — Encounter: Payer: Self-pay | Admitting: Hematology and Oncology

## 2024-02-03 ENCOUNTER — Other Ambulatory Visit (HOSPITAL_COMMUNITY): Payer: Self-pay

## 2024-02-03 NOTE — Telephone Encounter (Signed)
 Pharmacy Patient Advocate Encounter  Received notification from [facility] that Prior Authorization for Modafinil 200MG  tablets has been APPROVED from 02/03/2024 to 02/02/2025   PA #/Case ID/Reference #: [id number] Key: Kara Pacer

## 2024-02-24 ENCOUNTER — Other Ambulatory Visit: Payer: Self-pay | Admitting: Neurology

## 2024-03-01 ENCOUNTER — Other Ambulatory Visit: Payer: Self-pay | Admitting: *Deleted

## 2024-03-01 ENCOUNTER — Encounter: Payer: Self-pay | Admitting: Neurology

## 2024-03-01 MED ORDER — SERTRALINE HCL 50 MG PO TABS: 50.0000 mg | ORAL_TABLET | Freq: Every day | ORAL | 0 refills | Status: DC

## 2024-03-15 ENCOUNTER — Telehealth: Payer: Self-pay

## 2024-03-15 ENCOUNTER — Encounter: Payer: Self-pay | Admitting: Hematology and Oncology

## 2024-03-15 ENCOUNTER — Other Ambulatory Visit (HOSPITAL_COMMUNITY): Payer: Self-pay

## 2024-03-15 NOTE — Telephone Encounter (Signed)
 Pharmacy Patient Advocate Encounter   Received notification from Fax that prior authorization for Zeposia 0.92MG  capsules is required/requested.   Insurance verification completed.   The patient is insured through [facility] .   Per test claim: PA required; PA started via CoverMyMeds. KEY [id number] . Waiting for clinical questions to populate.

## 2024-03-17 NOTE — Telephone Encounter (Addendum)
 PA expired-resubmitting-  Pharmacy Patient Advocate Encounter   Received notification from Fax that prior authorization for Zeposia  is required/requested.   Insurance verification completed.   The patient is insured through [facility] .   Per test claim: PA required; PA started via CoverMyMeds. KEY [id number] . Waiting for clinical questions to populate.

## 2024-03-19 NOTE — Telephone Encounter (Signed)
 Received a faxed form requesting add info-faxed completed form and sent to [phone].

## 2024-03-20 ENCOUNTER — Other Ambulatory Visit (HOSPITAL_COMMUNITY): Payer: Self-pay

## 2024-03-22 NOTE — Telephone Encounter (Signed)
 Lynn Roberts

## 2024-04-05 ENCOUNTER — Encounter: Payer: Self-pay | Admitting: Neurology

## 2024-04-05 ENCOUNTER — Telehealth: Payer: Self-pay | Admitting: Neurology

## 2024-04-05 ENCOUNTER — Ambulatory Visit (INDEPENDENT_AMBULATORY_CARE_PROVIDER_SITE_OTHER): Admitting: Neurology

## 2024-04-05 VITALS — BP 117/77 | HR 63 | Ht 71.0 in | Wt 259.0 lb

## 2024-04-05 DIAGNOSIS — G35 Multiple sclerosis: Secondary | ICD-10-CM | POA: Diagnosis not present

## 2024-04-05 DIAGNOSIS — R32 Unspecified urinary incontinence: Secondary | ICD-10-CM

## 2024-04-05 DIAGNOSIS — E559 Vitamin D deficiency, unspecified: Secondary | ICD-10-CM | POA: Diagnosis not present

## 2024-04-05 DIAGNOSIS — H469 Unspecified optic neuritis: Secondary | ICD-10-CM

## 2024-04-05 DIAGNOSIS — E538 Deficiency of other specified B group vitamins: Secondary | ICD-10-CM

## 2024-04-05 DIAGNOSIS — R5383 Other fatigue: Secondary | ICD-10-CM

## 2024-04-05 DIAGNOSIS — Z79899 Other long term (current) drug therapy: Secondary | ICD-10-CM

## 2024-04-05 DIAGNOSIS — Z9884 Bariatric surgery status: Secondary | ICD-10-CM

## 2024-04-05 DIAGNOSIS — R159 Full incontinence of feces: Secondary | ICD-10-CM

## 2024-04-05 DIAGNOSIS — E610 Copper deficiency: Secondary | ICD-10-CM

## 2024-04-05 DIAGNOSIS — E519 Thiamine deficiency, unspecified: Secondary | ICD-10-CM

## 2024-04-05 MED ORDER — PREDNISONE 50 MG PO TABS: ORAL_TABLET | ORAL | 0 refills | Status: DC

## 2024-04-05 NOTE — Progress Notes (Addendum)
 [facility]  PATIENT: Lynn Roberts DOB: 14-Nov-1986  REFERRING DOCTOR OR PCP:  Dr. Larue Pock (Ophthalmology); Tona Francis, MD (Neuro-hospitalist); Eleanore Grey (PCP) SOURCE:   _________________________________   HISTORICAL  CHIEF COMPLAINT:  Chief Complaint  Patient presents with   Follow-up    Pt in 10 alone Pt here for MS f/u Pt states seeing flies in both eyes, increased headaches, incontinent bowl and urine.     HISTORY OF PRESENT ILLNESS:  Lynn Roberts, is a 38 y.o. woman with relapsing remitting multiple sclerosis.   UPDATE 12/17/2023 She noted some sparkles in her vision yesterday - like fireflies and involving both eyes.   When she woe up this morning, she felt she had a dark spot in her right eye.   Color vision is desaturated on the right (this is old since ON when she presented with MS.   She denies any numbness or weakness.Compared to yesterday the sparkles are the same.  This fluctuates.      She has urinary urgency with some incontinence.   This is worse the last couple weeks.   Conservative methods and medications like oxybutynin  have not been beneficial.  She also has had bowel incontinence x 5 the last 2 weeks.   No numbness or weakness in limbs.  No neck or back pain.    She has lost 10 pounds since January.  No N/V.    She has had bariatric surgery (duodenal switch) and lost 400+ pounds  During the pregnancy (delivered 12/2022) she was on Copaxone  and she went back on Zeposia  mid February 2024.  MRI 01/15/2024 showed one new MS focus compared to 02/22/2024 (after she restarted med's).     She had Vitamin A  deficiency during her pregnancy.   She has a h/o gastric bypass and has had B12, copper  and Vit D deficiency.     Vit D was low at 14 and we started 50000 Units weekly - she will change to  5000 daily.  She is tolerating Zeposia  well.  She feels her MS is stable.    She states her gait is stable and she has no difficulty on even  surfaces.  She walks more slowly with longer distance.     Balance is slightly off but not poor.   She uses the bannister on stairs.  No falls this year.   She feels strength is doing well.   No muscle spasms.   She denies tingling or numbness.     She presented with left optic neuritis.  Vision improved.   Still ha smild reduced VA and color saturation OS..   She still sees  firefly like floaters off/on.       She has urinary frequency and rare urge incontinence.  Oxybutynin  has helped and we will chang eto tid as bid sometimes wears off.  The immediate release preparation seemed to help her better than the extended release.   She is sleeping well most nights but has daily fatigue.    She does not have excessive daytime sleepiness.  Mood is a little worse and sometimes she feels things get to her more.   More stress with her husband had a suicide attempt in March..  He is doing better.    She has occasional nervousness but no severe anxiety.    She is on sertraline .   Cognition is doing well.    She has returned to work 04/14/2023.   She feels more tired since returning to  work, some days much more than others.   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    MS History: She presented to [facility] 02/22/2021 after she had the onset of left eye pain, headache and complete loss of vision OS 02/21/2021.  She saw Dr. Bernetta Brilliant (ophthalmology) who advised her to go to the ED.    She had studies and was admitted and received 5 days of IV steroids.  MRI of the orbits confirmed left optic neuritis.  MRI of the brain showed multiple foci consistent with MS.  2 foci in the left hemisphere enhanced after contrast.  MRI of the spine did not show any additional lesions though there was extensive movement artifact that limited the study.   She had no other neurologic episode in the past.   She began to improve a day after the IV Solu-Medrol  was initiated and was much better by discharge.      She started  Zeposia  04/2021.    She became pregnant and stopped Zeposia  05/2021 (was 7-[redacted] weeks pregnant at time)   She started Copaxone  06/2022.    She went back to Zeposia  01/2023.      Other Medical:  She was > 650 pounds and had bariatric surgery (duodenal switch), losing > 400 pounds in 2015.   She had an anal fissure last year requiring surgery and still has some mild bleeding.   She has seen surgery and they have requested a colonoscopy.     She smokes and is trying to quit (using patches).   She has two kids ages 1 and 3.       Imaging: MRI brain 01/2023 showed no new lesions  MRI of the brain 02/22/2021 shows multiple T2/FLAIR hyperintense foci in the hemispheres in a pattern consistent with multiple sclerosis.  2 foci on the left show subtle enhancement after contrast.  MRI of the orbits 02/22/2021 shows enhancement of the left optic nerve.  MRI of the cervical spine 02/23/2021 limited by movement artifact shows a normal spinal cord and mild multilevel degenerative changes.  MRI of the thoracic spine 02/23/2021 limited by movement artifact shows a normal spinal cord and degenerative changes with mild spinal stenosis at T11-T12 and T12-L1.   Laboratory tests March 2022:   Anti-NMO and anti-MOG are negative.  B12 low at 152 (180-914).   EKG normal 2/.2021  REVIEW OF SYSTEMS: Constitutional: No fevers, chills, sweats, or change in appetite Eyes: No visual changes, double vision, eye pain Ear, nose and throat: No hearing loss, ear pain, nasal congestion, sore throat Cardiovascular: No chest pain, palpitations Respiratory:  No shortness of breath at rest or with exertion.   No wheezes GastrointestinaI: No nausea, vomiting, diarrhea, abdominal pain, fecal incontinence Genitourinary:  No dysuria, urinary retention or frequency.  No nocturia. Musculoskeletal:  No neck pain, back pain Integumentary: No rash, pruritus, skin lesions Neurological: as above Psychiatric: No depression at this time.  No  anxiety Endocrine: No palpitations, diaphoresis, change in appetite, change in weigh or increased thirst Hematologic/Lymphatic:  No anemia, purpura, petechiae. Allergic/Immunologic: No itchy/runny eyes, nasal congestion, recent allergic reactions, rashes  ALLERGIES: Allergies  Allergen Reactions   Nsaids Other (See Comments)    HX. OF WEIGHT-LOSS SURGERY    Sulfa Antibiotics Rash   Tramadol Rash    Had after 2014 and tolerated okay    HOME MEDICATIONS:  Current Outpatient Medications:    Copper  Gluconate (COPPER  CAPS) 2 MG CAPS, Take 2 mg  by mouth daily., Disp: , Rfl:    cyanocobalamin  (VITAMIN B12) 1000 MCG/ML injection, INJECT 1 ML INTO THE SKIN EVERY 4 WEEKS, Disp: 3 mL, Rfl: 1   ferrous sulfate  325 (65 FE) MG tablet, Take 325 mg by mouth daily with breakfast., Disp: , Rfl:    modafinil  (PROVIGIL ) 200 MG tablet, Take 1 tablet (200 mg total) by mouth daily., Disp: 30 tablet, Rfl: 5   Multiple Vitamin (MULTIVITAMIN WITH MINERALS) TABS tablet, Take 1 tablet by mouth daily., Disp: 30 tablet, Rfl: 0   NIFEdipine  (ADALAT  CC) 30 MG 24 hr tablet, Take 1 tablet (30 mg total) by mouth daily., Disp: 30 tablet, Rfl: 0   NIFEdipine  (PROCARDIA -XL/NIFEDICAL-XL) 30 MG 24 hr tablet, Take 30 mg by mouth daily., Disp: , Rfl:    nystatin ointment (MYCOSTATIN), Apply 1 Application topically daily as needed (Rash)., Disp: , Rfl:    oxybutynin  (DITROPAN ) 5 MG tablet, one po tid, Disp: 270 tablet, Rfl: 3   predniSONE  (DELTASONE ) 50 MG tablet, 20 tablets (1000 mg) po every day x 4 d for MS exacerbation. Take with food.  May split over the day, Disp: 80 tablet, Rfl: 0   sertraline  (ZOLOFT ) 50 MG tablet, Take 1 tablet (50 mg total) by mouth daily., Disp: 90 tablet, Rfl: 0   Vitamin D , Ergocalciferol , (DRISDOL ) 1.25 MG (50000 UNIT) CAPS capsule, Take 1 capsule (50,000 Units total) by mouth every 7 (seven) days., Disp: 13 capsule, Rfl: 3   ZEPOSIA  0.92 MG CAPS, TAKE 1 CAPSULE BY MOUTH 1 TIME A DAY, Disp: 90  capsule, Rfl: 1   baclofen  (LIORESAL ) 10 MG tablet, Take 1 tablet (10 mg total) by mouth 3 (three) times daily as needed for muscle spasms. (Patient not taking: Reported on 04/05/2024), Disp: 30 each, Rfl: 0  PAST MEDICAL HISTORY: Past Medical History:  Diagnosis Date   Arthritis of low back    Complication of anesthesia    is of [place] descent   Dysrhythmia    hx PAT   Family history of adverse reaction to anesthesia    pt is a [place]   GERD (gastroesophageal reflux disease)    none since pregnancy    Heart murmur    When pt. was pregnant with her last child, and also had murmur as a child.     Hemorrhoids    History of anal fissures    History of PAT (paroxysmal atrial tachycardia)    had cardiac ablation as a teenager   History of sleep apnea    none since 450 pound weight loss in 2015   Migraines    MS (multiple sclerosis) (HCC)    PCOD (polycystic ovarian disease)    no menses    PAST SURGICAL HISTORY: Past Surgical History:  Procedure Laterality Date   BREAST LUMPECTOMY Left 2011   papilloma   BREAST LUMPECTOMY Left 2015   papilloma   BREAST LUMPECTOMY WITH RADIOACTIVE SEED LOCALIZATION Left 10/18/2014   Procedure:  RADIOACTIVE SEED GUIDED EXCISIONAL BREAST BIOPSY;  Surgeon: Juanita Norlander, MD;  Location: [facility];  Service: General;  Laterality: Left;   BREAST SURGERY Right 07/27/2004   retro-areolar dissection with exc. of large duct   CARDIAC ELECTROPHYSIOLOGY MAPPING AND ABLATION  sge 13   CARDIAC ELECTROPHYSIOLOGY STUDY AND ABLATION  age 18   CESAREAN SECTION N/A 03/28/2020   Procedure: CESAREAN SECTION;  Surgeon: Gaspar Karma, MD;  Location: [facility];  Service: Obstetrics;  Laterality: N/A;  RNFA Doylene Genet  CESAREAN SECTION  03/2018   CESAREAN SECTION WITH BILATERAL TUBAL LIGATION Bilateral 12/27/2022   Procedure: CESAREAN SECTION WITH BILATERAL TUBAL LIGATION;  Surgeon: Gaspar Karma, MD;  Location: [facility];  Service: Obstetrics;   Laterality: Bilateral;  incr r/o malignant hyperthermia with isoflurane anesthetic induction as patient is [place]   CHOLECYSTECTOMY  04/27/2008   EVALUATION UNDER ANESTHESIA WITH ANAL FISTULECTOMY N/A 02/10/2023   Procedure: CONTROL OF ANAL FISTULAS WITH PLACEMENT OF SETON AND ANORECTAL EXAM UNDER ANESTHESIA;  Surgeon: Melvenia Stabs, MD;  Location: [facility];  Service: General;  Laterality: N/A;   GASTROPLASTY DUODENAL SWITCH  01/2014   done at [facility]   INCISION AND DRAINAGE ABSCESS Bilateral 10/11/2022   Procedure: Incision and drainage of Bilateral Perirectoal abscess, Possible Seton;  Surgeon: Enid Harry, MD;  Location: [facility];  Service: General;  Laterality: Bilateral;   INCISION AND DRAINAGE PERIRECTAL ABSCESS N/A 08/15/2020   Procedure: ANORECTAL EXAM UNDER ANESTHESIA INCISION AND DRAINAGE OF PERIRECTAL ABSCESS x2 WITH PLACEMENT OF SETON;  Surgeon: Melvenia Stabs, MD;  Location: [facility];  Service: General;  Laterality: N/A;   OVARIAN CYST REMOVAL Bilateral 1999   PLACEMENT OF SETON  08/15/2020   PLACEMENT OF SETON N/A 12/08/2020   Procedure: PLACEMENT OF CUTTING SETON; PARTIAL FISTULOTOMY;  Surgeon: Melvenia Stabs, MD;  Location: [facility];  Service: General;  Laterality: N/A;   RECTAL EXAM UNDER ANESTHESIA N/A 12/08/2020   Procedure: ANORECTAL EXAM UNDER ANESTHESIA;  Surgeon: Melvenia Stabs, MD;  Location:  SURGERY CENTER;  Service: General;  Laterality: N/A;   TOENAIL EXCISION  yrs ago   UNILATERAL SALPINGECTOMY      FAMILY HISTORY: Family History  Problem Relation Age of Onset   Hypertension Mother    Diabetes Mother    Hypertension Father    Hyperlipidemia Father    Cancer Maternal Grandmother        multiple myaloma, breast cancer   Heart disease Maternal Grandmother    Diabetes Maternal Grandfather    Cancer Paternal Grandfather        pancreactic   Colon cancer Neg Hx    Stomach cancer Neg Hx    Rectal cancer Neg Hx     Esophageal cancer Neg Hx     SOCIAL HISTORY:  Social History   Socioeconomic History   Marital status: Married    Spouse name: Bearl Limes   Number of children: 2   Years of education: 12   Highest education level: Not on file  Occupational History   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    Lives with husband   Caffeine use: 1 cup per day of tea   Pt works    Social Drivers of Corporate investment banker Strain: Not on file  Food Insecurity: No Food Insecurity (12/30/2022)   Hunger Vital Sign  Worried About Programme researcher, broadcasting/film/video in the Last Year: Never true    Ran Out of Food in the Last Year: Never true  Transportation Needs: No Transportation Needs (12/30/2022)   PRAPARE - Administrator, Civil Service (Medical): No    Lack of Transportation (Non-Medical): No  Physical Activity: Not on file  Stress: Not on file  Social Connections: Unknown (04/01/2022)   [facility]   Social Network    Social Network: Not on file  Intimate Partner Violence: Not At Risk (12/27/2022)   Humiliation, Afraid, Rape, and Kick questionnaire    Fear of Current or Ex-Partner: No    Emotionally Abused: No    Physically Abused: No    Sexually Abused: No     PHYSICAL EXAM  Vitals:   04/05/24 1525  BP: 117/77  Pulse: 63  Weight: 259 lb (117.5 kg)  Height: 5\' 11"  (1.803 m)    Body mass index is 36.12 kg/m.   No results found.   General: The  patient is well-developed and well-nourished and in no acute distress  HEENT:  Head is [place]/AT.  Sclera are anicteric.    Skin: Extremities are without rash or edema.  Neurologic Exam  Mental status: The patient is alert and oriented x 3 at the time of the examination. The patient has apparent normal recent and remote memory, with an apparently normal attention span and concentration ability.   Speech is normal.  Cranial nerves: Extraocular movements are full.  Reduced color vision OS.  Visual acuity is fairly symmetrical.  There is good facial sensation to soft touch bilaterally.Facial strength is normal.  No obvious hearing deficits are noted.  Motor:  Muscle bulk is normal.   Tone is normal. Strength is  5 / 5 in all 4 extremities.   Sensory: Sensory testing is intact to pinprick, soft touch and vibration sensation in all 4 extremities.  Coordination: Cerebellar testing reveals good finger-nose-finger and heel-to-shin bilaterally.  Gait and station: Station is normal.   Gait is normal.  Her tandem gait is mildly wide.  Reflexes: Deep tendon reflexes are symmetric and normal bilaterally.      DIAGNOSTIC DATA (LABS, IMAGING, TESTING) - I reviewed patient records, labs, notes, testing and imaging myself where available.  Lab Results  Component Value Date   WBC 5.0 12/17/2023   HGB 12.6 12/17/2023   HCT 39.9 12/17/2023   MCV 86 12/17/2023   PLT 207 12/17/2023      Component Value Date/Time   NA 142 05/28/2023 1600   K 4.2 05/28/2023 1600   CL 109 (H) 05/28/2023 1600   CO2 21 05/28/2023 1600   GLUCOSE 77 05/28/2023 1600   GLUCOSE 71 01/27/2023 1314   BUN 7 05/28/2023 1600   CREATININE 0.53 (L) 05/28/2023 1600   CREATININE 0.62 03/03/2020 1452   CALCIUM  8.7 05/28/2023 1600   PROT 6.1 05/28/2023 1600   ALBUMIN 3.9 05/28/2023 1600   AST 13 05/28/2023 1600   AST 10 (L) 03/03/2020 1452   ALT 12 05/28/2023 1600   ALT 10 03/03/2020 1452   ALKPHOS 57 05/28/2023 1600    BILITOT 0.4 05/28/2023 1600   BILITOT 0.4 03/03/2020 1452   GFRNONAA >60 01/27/2023 1314   GFRNONAA >60 03/03/2020 1452   GFRAA >60 08/15/2020 0354   GFRAA >60 03/03/2020 1452   No results found for: "CHOL", "HDL", "LDLCALC", "LDLDIRECT", "TRIG", "CHOLHDL" Lab Results  Component Value Date   HGBA1C 4.6 (L) 11/05/2017  Lab Results  Component Value Date   VITAMINB12 449 05/28/2023   Lab Results  Component Value Date   TSH 1.620 05/28/2023       ASSESSMENT AND PLAN  Multiple sclerosis (HCC) - Plan: MR THORACIC SPINE W WO CONTRAST, MR CERVICAL SPINE W WO CONTRAST, MR BRAIN W WO CONTRAST, CBC with Differential/Platelet  High risk medication use - Plan: CBC with Differential/Platelet  Vitamin D  deficiency  S/P bariatric surgery - Plan: Vitamin B1, Vitamin B12, Copper , serum  Other fatigue  Optic neuritis - Plan: Vitamin B1, Vitamin B12  B12 deficiency - Plan: Vitamin B12  Copper  deficiency - Plan: Copper , serum  Thiamin deficiency - Plan: Vitamin B1  Urinary incontinence, unspecified type  Incontinence of feces, unspecified fecal incontinence type   She appears to be having a relapse and I sent in a prescription for prednisone  1000 mg daily x 4 days.  She has tolerated high-dose steroids in the past.  Continue  Zeposia  for now and check labs check MRI of the brain and spineto determine if there is subclinical progression.  If this is occurring or another similar episode occurs consider changing to one of the anti-CD20 agents or natalizumab Stay active and exercise as tolerated.   She has had gastric bypass/duodenal switch.  Check thiamine Vit D, B12 and copper  to r/o deficency as cause of symptoms. .   I also advised her to see ophthalmology to make sure that she is not having a retinal detachment or other intraocular problem  She needs incontinence supplies.  She has daily urinary incontinence.  Conservative methods (training, timed voids) have not been beneficial.   She has not had good response to medications like oxybutynin   Rtc 6 months or call sooner if new or worsening issues.     Selena Swaminathan A. Godwin Lat, MD, [facility] 04/05/2024, 4:07 PM Certified in Neurology, Clinical Neurophysiology, Sleep Medicine and Neuroimaging  [facility] [address], [address] [place], [place] [address] [phone]

## 2024-04-05 NOTE — Telephone Encounter (Addendum)
 Called pt. She also reports flashing lights in vision. Went on vacation two weeks ago. While gone, had to purchase briefs d/t worsening bowel/bladder incontinence. Taking oxybutynin  but feels ineffective. Does not feel she has UTI. No recent illness/infection.   Blurry vision started yesterday with headache. Tried tylenol  but only helped some. No pain with eye movement. Pain located on right side of head above right eye.   Still taking Zeposia , no missed doses.  No other new meds started recently.  Updated med list/pharmacy/allergy list.  Aware I will speak with MD and call her back.

## 2024-04-05 NOTE — Telephone Encounter (Signed)
 Spoke w/ MD. He would like to bring pt in today for visit to evaluate. Called pt, scheduled 04/05/24 at 3:30pm, check in 3:00pm. Pt verbalized understanding.

## 2024-04-05 NOTE — Telephone Encounter (Signed)
 Patient called in this morning stating she was wondering if she could see Dr. Godwin Lat today. She stated that she has had a headache since  yesterday, eyes are blurry, lost control of her bladder and bowls.

## 2024-04-05 NOTE — Addendum Note (Signed)
 Addended by: Oneita Bihari on: 04/05/2024 09:31 AM   Modules accepted: Orders

## 2024-04-06 LAB — HEPATITIS B CORE ANTIBODY, TOTAL: Hep B Core Total Ab: NEGATIVE

## 2024-04-06 LAB — HEPATITIS C ANTIBODY: Hep C Virus Ab: NONREACTIVE

## 2024-04-06 LAB — HEPATITIS B SURFACE ANTIGEN: Hepatitis B Surface Ag: NEGATIVE

## 2024-04-09 LAB — CBC WITH DIFFERENTIAL/PLATELET
Basophils Absolute: 0 10*3/uL (ref 0.0–0.2)
Basos: 1 %
EOS (ABSOLUTE): 0.1 10*3/uL (ref 0.0–0.4)
Eos: 2 %
Hematocrit: 43 % (ref 34.0–46.6)
Hemoglobin: 13.6 g/dL (ref 11.1–15.9)
Immature Grans (Abs): 0 10*3/uL (ref 0.0–0.1)
Immature Granulocytes: 0 %
Lymphocytes Absolute: 1.1 10*3/uL (ref 0.7–3.1)
Lymphs: 17 %
MCH: 27.2 pg (ref 26.6–33.0)
MCHC: 31.6 g/dL (ref 31.5–35.7)
MCV: 86 fL (ref 79–97)
Monocytes Absolute: 0.5 10*3/uL (ref 0.1–0.9)
Monocytes: 8 %
Neutrophils Absolute: 4.5 10*3/uL (ref 1.4–7.0)
Neutrophils: 72 %
Platelets: 227 10*3/uL (ref 150–450)
RBC: 5 x10E6/uL (ref 3.77–5.28)
RDW: 13.1 % (ref 11.7–15.4)
WBC: 6.3 10*3/uL (ref 3.4–10.8)

## 2024-04-09 LAB — QUANTIFERON-TB GOLD PLUS
QuantiFERON Mitogen Value: >10 [IU]/mL
QuantiFERON Nil Value: 0.06 [IU]/mL
QuantiFERON TB1 Ag Value: 0.07 [IU]/mL
QuantiFERON TB2 Ag Value: 0.06 [IU]/mL
QuantiFERON-TB Gold Plus: NEGATIVE

## 2024-04-09 LAB — VITAMIN B1: Thiamine: 109.7 nmol/L (ref 66.5–200.0)

## 2024-04-09 LAB — IGG, IGA, IGM
IgA/Immunoglobulin A, Serum: 264 mg/dL (ref 87–352)
IgG (Immunoglobin G), Serum: 1316 mg/dL (ref 586–1602)
IgM (Immunoglobulin M), Srm: 70 mg/dL (ref 26–217)

## 2024-04-09 LAB — COPPER, SERUM: Copper: 85 ug/dL (ref 80–158)

## 2024-04-09 LAB — HIV ANTIBODY (ROUTINE TESTING W REFLEX): HIV Screen 4th Generation wRfx: NONREACTIVE

## 2024-04-09 LAB — VITAMIN B12: Vitamin B-12: 450 pg/mL (ref 232–1245)

## 2024-04-10 ENCOUNTER — Encounter: Payer: Self-pay | Admitting: Neurology

## 2024-04-11 ENCOUNTER — Telehealth: Payer: Self-pay | Admitting: Neurology

## 2024-04-11 ENCOUNTER — Other Ambulatory Visit: Payer: Self-pay | Admitting: Neurology

## 2024-04-11 NOTE — Telephone Encounter (Signed)
 Patient called, complaining of pain all over, swollen face and hands. She has completed 4 days of 1000 mg steroid, last dose Friday, and her symptoms started earlier today but getting better. Advised her to monitor her symptoms, go to the hospital if they do get worse otherwise, you can reach out to Dr. Godwin Lat tomorrow if needed.   Dr. Joniya Boberg

## 2024-04-12 ENCOUNTER — Other Ambulatory Visit: Payer: Self-pay | Admitting: Neurology

## 2024-04-12 ENCOUNTER — Encounter: Payer: Self-pay | Admitting: Neurology

## 2024-04-12 DIAGNOSIS — N3941 Urge incontinence: Secondary | ICD-10-CM

## 2024-04-12 DIAGNOSIS — G35 Multiple sclerosis: Secondary | ICD-10-CM

## 2024-04-13 ENCOUNTER — Other Ambulatory Visit (HOSPITAL_COMMUNITY): Payer: Self-pay

## 2024-04-13 ENCOUNTER — Telehealth: Payer: Self-pay | Admitting: Pharmacist

## 2024-04-13 NOTE — Telephone Encounter (Signed)
 Pharmacy Patient Advocate Encounter   Received notification from CoverMyMeds that prior authorization for Zeposia  0.92MG  capsules is required/requested.   Insurance verification completed.   The patient is insured through [facility] .   Per test claim: PA not needed at this time. Active PA on file through 03/19/2025

## 2024-04-15 ENCOUNTER — Other Ambulatory Visit: Payer: Self-pay | Admitting: *Deleted

## 2024-04-15 ENCOUNTER — Telehealth: Payer: Self-pay | Admitting: *Deleted

## 2024-04-15 MED ORDER — ALPRAZOLAM 0.5 MG PO TABS: ORAL_TABLET | ORAL | 0 refills | Status: DC

## 2024-04-15 NOTE — Telephone Encounter (Signed)
 Faxed last office note as requested. Received fax confirmation.

## 2024-04-15 NOTE — Telephone Encounter (Signed)
 MRI brain: [facility]: [id number] exp. 04/15/24-10/12/24, [facility] [place]: [id number] exp. 04/15/24-06/13/24  MRI cervical & thoracic: [facility]: [id number] exp. 04/15/24-10/12/24, [facility] [place]: [id number] exp. 04/15/24-06/13/24 sent to [facility] [phone]

## 2024-04-15 NOTE — Progress Notes (Signed)
 Dr. Godwin Lat- pt requesting xanax  for MRI's you ordered. Please e-scribe. Rx ready to be signed below

## 2024-04-22 ENCOUNTER — Ambulatory Visit (HOSPITAL_COMMUNITY)
Admission: RE | Admit: 2024-04-22 | Discharge: 2024-04-22 | Disposition: A | Discharge status: Home / Self Care | Source: Ambulatory Visit | Attending: Neurology | Admitting: Neurology

## 2024-04-22 DIAGNOSIS — G35 Multiple sclerosis: Secondary | ICD-10-CM

## 2024-04-22 MED ORDER — GADOBUTROL 1 MMOL/ML IV SOLN
10.0000 mL | Freq: Once | INTRAVENOUS | Status: AC | PRN
  Administered 2024-04-22: 10 mL via INTRAVENOUS

## 2024-04-22 NOTE — Telephone Encounter (Signed)
 Lynn Roberts from [facility] called stating that the are needing the [employer]. 2 ICD codes, list of medical supplies and the sizes.

## 2024-04-22 NOTE — Telephone Encounter (Signed)
 Called [facility] [employer]  [phone] Was told that whomever called didn't notate who called and why so will wait until we get a callback

## 2024-04-30 ENCOUNTER — Encounter: Payer: Self-pay | Admitting: Neurology

## 2024-05-03 ENCOUNTER — Ambulatory Visit: Payer: Self-pay | Admitting: Neurology

## 2024-05-04 ENCOUNTER — Ambulatory Visit: Admission: EM | Admit: 2024-05-04 | Discharge: 2024-05-04 | Disposition: A | Discharge status: Home / Self Care

## 2024-05-04 DIAGNOSIS — J0100 Acute maxillary sinusitis, unspecified: Secondary | ICD-10-CM | POA: Diagnosis not present

## 2024-05-04 MED ORDER — BENZONATATE 100 MG PO CAPS: 100.0000 mg | ORAL_CAPSULE | Freq: Three times a day (TID) | ORAL | 0 refills | Status: DC

## 2024-05-04 MED ORDER — AMOXICILLIN-POT CLAVULANATE 875-125 MG PO TABS: 1.0000 | ORAL_TABLET | Freq: Two times a day (BID) | ORAL | 0 refills | Status: DC

## 2024-05-04 NOTE — Discharge Instructions (Signed)
Your evaluation shows you have a bacterial sinus infection. - Take antibiotic sent to pharmacy as directed to treat sinus infection. - Purchase Mucinex over the counter and take this every 12 hours as needed for nasal congestion and cough. - Cough medicines as needed. - Warm compresses to the cheeks and forehead as needed to help with sinus headaches as well as tylenol as needed.  If you develop any new or worsening symptoms or if your symptoms do not start to improve, please return here or follow-up with your primary care provider. If your symptoms are severe, please go to the emergency room.

## 2024-05-04 NOTE — ED Provider Notes (Signed)
 [facility]    CSN: [id number] Arrival date & time: 05/04/24  1524      History   Chief Complaint Chief Complaint  Patient presents with   Cough   Nasal Congestion    HPI Lynn Roberts is a 38 y.o. female.   Lynn Roberts is a 38 y.o. female presenting for chief complaint of Cough and Nasal Congestion that started 7-8 days ago.  She has now developed sore throat due to persistent coughing.  Sore throat is worsened by coughing and swallowing.  Cough is somewhat productive with green/yellow sputum but is mostly dry.  Nasal congestion is green and she reports bilateral maxillary sinus tenderness.  Denies ear pain, fever, chills, neck pain, nausea, vomiting, diarrhea, abdominal pain, and rash.  Denies shortness of breath, chest pain, heart palpitations, and recent antibiotic or steroid use.  History of MS.  Her children have been sick with similar symptoms.  Denies history of asthma/COPD.  Never smoker.  Taking over-the-counter medications with minimal relief.   Cough   Past Medical History:  Diagnosis Date   Arthritis of low back    Complication of anesthesia    is of [place] descent   Dysrhythmia    hx PAT   Family history of adverse reaction to anesthesia    pt is a [place]   GERD (gastroesophageal reflux disease)    none since pregnancy    Heart murmur    When pt. was pregnant with her last child, and also had murmur as a child.     Hemorrhoids    History of anal fissures    History of PAT (paroxysmal atrial tachycardia)    had cardiac ablation as a teenager   History of sleep apnea    none since 450 pound weight loss in 2015   Migraines    MS (multiple sclerosis) (HCC)    PCOD (polycystic ovarian disease)    no menses    Patient Active Problem List   Diagnosis Date Noted   History of cesarean section 12/27/2022   Tobacco abuse 07/10/2022   [redacted] weeks gestation of pregnancy 06/05/2022   Multiple sclerosis exacerbation (HCC) 05/15/2021   High  risk medication use 05/15/2021   Vitamin D  deficiency 05/15/2021   Copper  deficiency 05/15/2021   B12 deficiency 05/15/2021   Migraine with aura and without status migrainosus, not intractable 05/15/2021   Optic neuritis 02/22/2021   Multiple sclerosis (HCC) 02/22/2021   Hypokalemia 02/22/2021   Perirectal abscess 08/14/2020   Placenta previa before labor and cesarean delivery without hemorrhage 03/28/2020   Iron deficiency anemia due to chronic blood loss 03/03/2020   Duodenal atresia, fetal, affecting care of mother, 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   H/O bariatric surgery 09/11/2017   Papilloma of breast, left 06/08/2014   Back pain, chronic 08/10/2013   Diverticulosis 08/10/2013   Osteoarthritis of knee 08/10/2013   Sleep apnea 08/10/2013    Past Surgical History:  Procedure Laterality Date   BREAST LUMPECTOMY Left 2011   papilloma   BREAST LUMPECTOMY Left 2015   papilloma   BREAST LUMPECTOMY WITH RADIOACTIVE SEED LOCALIZATION Left 10/18/2014   Procedure:  RADIOACTIVE SEED GUIDED EXCISIONAL BREAST BIOPSY;  Surgeon: Juanita Norlander, MD;  Location: [facility];  Service: General;  Laterality: Left;   BREAST SURGERY Right 07/27/2004   retro-areolar dissection with exc. of large duct   CARDIAC ELECTROPHYSIOLOGY MAPPING AND ABLATION  [age]ge 13   CARDIAC ELECTROPHYSIOLOGY STUDY AND ABLATION  age 59  CESAREAN SECTION N/A 03/28/2020   Procedure: CESAREAN SECTION;  Surgeon: Gaspar Karma, MD;  Location: [facility];  Service: Obstetrics;  Laterality: N/A;  RNFA Tracey   CESAREAN SECTION  03/2018   CESAREAN SECTION WITH BILATERAL TUBAL LIGATION Bilateral 12/27/2022   Procedure: CESAREAN SECTION WITH BILATERAL TUBAL LIGATION;  Surgeon: Gaspar Karma, MD;  Location: [facility];  Service: Obstetrics;  Laterality: Bilateral;  incr r/o malignant hyperthermia with isoflurane anesthetic induction as patient is [place]   CHOLECYSTECTOMY  04/27/2008   EVALUATION UNDER ANESTHESIA WITH ANAL  FISTULECTOMY N/A 02/10/2023   Procedure: CONTROL OF ANAL FISTULAS WITH PLACEMENT OF SETON AND ANORECTAL EXAM UNDER ANESTHESIA;  Surgeon: Melvenia Stabs, MD;  Location: [facility];  Service: General;  Laterality: N/A;   GASTROPLASTY DUODENAL SWITCH  01/2014   done at [facility]   INCISION AND DRAINAGE ABSCESS Bilateral 10/11/2022   Procedure: Incision and drainage of Bilateral Perirectoal abscess, Possible Seton;  Surgeon: Enid Harry, MD;  [facility];  Service: General;  Laterality: Bilateral;   INCISION AND DRAINAGE PERIRECTAL ABSCESS N/A 08/15/2020   Procedure: ANORECTAL EXAM UNDER ANESTHESIA INCISION AND DRAINAGE OF PERIRECTAL ABSCESS x2 WITH PLACEMENT OF SETON;  Surgeon: Melvenia Stabs, MD;  Location: [facility];  Service: General;  Laterality: N/A;   OVARIAN CYST REMOVAL Bilateral 1999   PLACEMENT OF SETON  08/15/2020   PLACEMENT OF SETON N/A 12/08/2020   Procedure: PLACEMENT OF CUTTING SETON; PARTIAL FISTULOTOMY;  Surgeon: Melvenia Stabs, MD;  Location: [facility];  Service: General;  Laterality: N/A;   RECTAL EXAM UNDER ANESTHESIA N/A 12/08/2020   Procedure: ANORECTAL EXAM UNDER ANESTHESIA;  Surgeon: Melvenia Stabs, MD;  Location: [facility];  Service: General;  Laterality: N/A;   TOENAIL EXCISION  yrs ago   UNILATERAL SALPINGECTOMY      OB History     Gravida  4   Para  3   Term  2   Preterm  1   AB  1   Living  3      SAB  1   IAB      Ectopic      Multiple  0   Live Births  3            Home Medications    Prior to Admission medications   Medication Sig Start Date End Date Taking? Authorizing Provider  amoxicillin -clavulanate (AUGMENTIN ) 875-125 MG tablet Take 1 tablet by mouth every 12 (twelve) hours. 05/04/24  Yes Starlene Eaton, FNP  benzonatate (TESSALON) 100 MG capsule Take 1 capsule (100 mg total) by mouth every 8 (eight) hours. 05/04/24  Yes Starlene Eaton, FNP  Copper  Gluconate (COPPER   CAPS) 2 MG CAPS Take 2 mg by mouth daily.   Yes [provider]  cyanocobalamin  (VITAMIN B12) 1000 MCG/ML injection INJECT 1 ML INTO THE SKIN EVERY 4 WEEKS 01/15/24  Yes Sater, Sherida Dimmer, MD  Docusate Sodium  (DSS) 100 MG CAPS Take 100 mg by mouth as directed. 03/26/18  Yes [provider]  modafinil  (PROVIGIL ) 200 MG tablet Take 1 tablet (200 mg total) by mouth daily. 12/17/23  Yes Sater, Sherida Dimmer, MD  Multiple Vitamin (MULTIVITAMIN WITH MINERALS) TABS tablet Take 1 tablet by mouth daily. 02/26/21  Yes Haydee Lipa, MD  NIFEdipine  (PROCARDIA -XL/NIFEDICAL-XL) 30 MG 24 hr tablet Take 30 mg by mouth daily. 01/13/23  Yes [provider]  norethindrone (MICRONOR) 0.35 MG tablet Take 1 tablet by  mouth daily. 05/05/18  Yes [provider]  nortriptyline  (PAMELOR ) 25 MG capsule Take 25 mg by mouth at bedtime. 04/06/24  Yes [provider]  oxybutynin  (DITROPAN  XL) 15 MG 24 hr tablet Take 15 mg by mouth at bedtime. 03/11/24  Yes [provider]  polyethylene glycol (MIRALAX  / GLYCOLAX ) 17 g packet Take 17 g by mouth as directed. 03/04/18  Yes [provider]  ALPRAZolam  (XANAX ) 0.5 MG tablet Take 1-2 tablets by mouth about 30 min prior to MRI. Can take additional tablet at time of test if needed. Must have driver to and from test, can cause drowsiness. 04/15/24   Sater, Sherida Dimmer, MD  baclofen  (LIORESAL ) 10 MG tablet Take 1 tablet (10 mg total) by mouth 3 (three) times daily as needed for muscle spasms. Patient not taking: Reported on 04/05/2024 01/15/24   Jorie Newness, MD  ferrous sulfate  325 (65 FE) MG tablet Take 325 mg by mouth daily with breakfast.    [provider]  NIFEdipine  (ADALAT  CC) 30 MG 24 hr tablet Take 1 tablet (30 mg total) by mouth daily. 12/29/22   Clark, Dyanna, MD  nystatin ointment (MYCOSTATIN) Apply 1 Application topically daily as needed (Rash). 12/06/22   [provider]  oxybutynin  (DITROPAN ) 5 MG tablet one  po tid Patient taking differently: Take 15 mg by mouth at bedtime. one po tid 12/17/23   Sater, Sherida Dimmer, MD  predniSONE  (DELTASONE ) 50 MG tablet 20 tablets (1000 mg) po every day x 4 d for MS exacerbation. Take with food.  May split over the day 04/05/24   Sater, Sherida Dimmer, MD  sertraline  (ZOLOFT ) 50 MG tablet Take 1 tablet (50 mg total) by mouth daily. 03/01/24   Sater, Sherida Dimmer, MD  Vitamin D , Ergocalciferol , (DRISDOL ) 1.25 MG (50000 UNIT) CAPS capsule Take 1 capsule (50,000 Units total) by mouth every 7 (seven) days. 12/18/23   Sater, Sherida Dimmer, MD  ZEPOSIA  0.92 MG CAPS TAKE 1 CAPSULE BY MOUTH 1 TIME A DAY 02/24/24   Sater, Sherida Dimmer, MD    Family History Family History  Problem Relation Age of Onset   Hypertension Mother    Diabetes Mother    Hypertension Father    Hyperlipidemia Father    Cancer Maternal Grandmother        multiple myaloma, breast cancer   Heart disease Maternal Grandmother    Diabetes Maternal Grandfather    Cancer Paternal Grandfather        pancreactic   Colon cancer Neg Hx    Stomach cancer Neg Hx    Rectal cancer Neg Hx    Esophageal cancer Neg Hx     Social History Social History   Tobacco Use   Smoking status: Every Day    Current packs/day: 0.50    Average packs/day: 0.5 packs/day for 10.0 years (5.0 ttl pk-yrs)    Types: Cigarettes   Smokeless tobacco: Never   Tobacco comments:    4-5 cigs per day  Vaping Use   Vaping status: Never Used  Substance Use Topics   Alcohol use: No   Drug use: No     Allergies   Nsaids, Sulfa antibiotics, and Tramadol   Review of Systems Review of Systems  Respiratory:  Positive for cough.   Per HPI   Physical Exam Triage Vital Signs ED Triage Vitals  Encounter Vitals Group     BP 05/04/24 1545 120/75     Systolic BP Percentile --  Diastolic BP Percentile --      Pulse Rate 05/04/24 1545 85     Resp 05/04/24 1545 18     Temp 05/04/24 1545 97.9 F (36.6 C)     Temp Source 05/04/24 1545 Oral      SpO2 05/04/24 1545 96 %     Weight 05/04/24 1540 258 lb (117 kg)     Height 05/04/24 1540 5\' 11"  (1.803 m)     Head Circumference --      Peak Flow --      Pain Score 05/04/24 1540 0     Pain Loc --      Pain Education --      Exclude from Growth Chart --    No data found.  Updated Vital Signs BP 120/75 (BP Location: Left Arm)   Pulse 85   Temp 97.9 F (36.6 C) (Oral)   Resp 18   Ht 5\' 11"  (1.803 m)   Wt 258 lb (117 kg)   LMP 04/08/2024 (Exact Date)   SpO2 96%   BMI 35.98 kg/m   Visual Acuity Right Eye Distance:   Left Eye Distance:   Bilateral Distance:    Right Eye Near:   Left Eye Near:    Bilateral Near:     Physical Exam Vitals and nursing note reviewed.  Constitutional:      Appearance: She is not ill-appearing or toxic-appearing.  HENT:     Head: Normocephalic and atraumatic.     Jaw: There is normal jaw occlusion.     Right Ear: Hearing, tympanic membrane, ear canal and external ear normal.     Left Ear: Hearing, tympanic membrane, ear canal and external ear normal.     Nose: Congestion present.     Right Sinus: Maxillary sinus tenderness present.     Left Sinus: Maxillary sinus tenderness present.     Mouth/Throat:     Lips: Pink.     Mouth: Mucous membranes are moist. No injury or oral lesions.     Dentition: Normal dentition.     Tongue: No lesions.     Pharynx: Oropharynx is clear. Uvula midline. Posterior oropharyngeal erythema present. No pharyngeal swelling, oropharyngeal exudate, uvula swelling or postnasal drip.     Tonsils: No tonsillar exudate.     Comments: No trismus, phonation normal, maintaining secretions without difficulty.  Eyes:     General: Lids are normal. Vision grossly intact. Gaze aligned appropriately.     Extraocular Movements: Extraocular movements intact.     Conjunctiva/sclera: Conjunctivae normal.  Neck:     Trachea: Trachea and phonation normal.  Cardiovascular:     Rate and Rhythm: Normal rate and regular  rhythm.     Heart sounds: Normal heart sounds, S1 normal and S2 normal.  Pulmonary:     Effort: Pulmonary effort is normal. No respiratory distress.     Breath sounds: Normal breath sounds and air entry.  Musculoskeletal:     Cervical back: Neck supple.  Lymphadenopathy:     Cervical: No cervical adenopathy.  Skin:    General: Skin is warm and dry.     Capillary Refill: Capillary refill takes less than 2 seconds.     Findings: No rash.  Neurological:     General: No focal deficit present.     Mental Status: She is alert and oriented to person, place, and time. Mental status is at baseline.     Cranial Nerves: No dysarthria or facial asymmetry.  Psychiatric:  Mood and Affect: Mood normal.        Speech: Speech normal.        Behavior: Behavior normal.        Thought Content: Thought content normal.        Judgment: Judgment normal.      UC Treatments / Results  Labs (all labs ordered are listed, but only abnormal results are displayed) Labs Reviewed - No data to display  EKG   Radiology No results found.  Procedures Procedures (including critical care time)  Medications Ordered in UC Medications - No data to display  Initial Impression / Assessment and Plan / UC Course  I have reviewed the triage vital signs and the nursing notes.  Pertinent labs & imaging results that were available during my care of the patient were reviewed by me and considered in my medical decision making (see chart for details).   1. Acute non-recurrent maxillary sinusitis Presentation is consistent with acute postviral bacterial sinusitis.   Symptoms have been present for greater than 8 days and have not responded well to over-the-counter therapies, therefore may have antibiotic.  Prescriptions for further symptomatic relief sent, may continue using OTC medications as needed. Deferred imaging of the chest based on stable cardiopulmonary exam and hemodynamically stable vital  signs. Recommend warm compresses to the sinuses as needed.  Counseled patient on potential for adverse effects with medications prescribed/recommended today, strict ER and return-to-clinic precautions discussed, patient verbalized understanding.    Final Clinical Impressions(s) / UC Diagnoses   Final diagnoses:  Acute non-recurrent maxillary sinusitis   Discharge Instructions      Your evaluation shows you have a bacterial sinus infection. - Take antibiotic sent to pharmacy as directed to treat sinus infection. - Purchase Mucinex over the counter and take this every 12 hours as needed for nasal congestion and cough. - Cough medicines as needed. - Warm compresses to the cheeks and forehead as needed to help with sinus headaches as well as tylenol  as needed.  If you develop any new or worsening symptoms or if your symptoms do not start to improve, please return here or follow-up with your primary care provider. If your symptoms are severe, please go to the emergency room.  ED Prescriptions     Medication Sig Dispense Auth. Provider   benzonatate (TESSALON) 100 MG capsule Take 1 capsule (100 mg total) by mouth every 8 (eight) hours. 21 capsule Keosha Rossa M, FNP   amoxicillin -clavulanate (AUGMENTIN ) 875-125 MG tablet Take 1 tablet by mouth every 12 (twelve) hours. 14 tablet Starlene Eaton, FNP      PDMP not reviewed this encounter.   Starlene Eaton, [place] 05/04/24 [phone]

## 2024-05-04 NOTE — ED Triage Notes (Signed)
"  This started with Cough (about a week ago) then sore throat, the cough is causing my chest to be sore". No fever.

## 2024-05-12 NOTE — Telephone Encounter (Signed)
 I called [facility] reading room [phone] and spoke with Gaylin Ke he will forward both MRI's cervical and thoracic stat  to radiologist to be read.

## 2024-05-12 NOTE — Telephone Encounter (Signed)
 I spoke to Lynn Roberts about the MRI results.  There were no new lesions in the brain or spine spine.  Unclear if the symptoms she had affecting her vision last month were an MS exacerbation.  She feels she is back to baseline.  For now we will continue Zeposia  but consider a switch to an anti-CD20 or natalizumab if she has additional lesions.  Will recheck the MRI next year.

## 2024-05-21 ENCOUNTER — Other Ambulatory Visit: Payer: Self-pay

## 2024-05-21 ENCOUNTER — Emergency Department (HOSPITAL_BASED_OUTPATIENT_CLINIC_OR_DEPARTMENT_OTHER)
Admission: EM | Admit: 2024-05-21 | Discharge: 2024-05-21 | Disposition: A | Discharge status: Home / Self Care | Attending: Emergency Medicine | Admitting: Emergency Medicine

## 2024-05-21 ENCOUNTER — Emergency Department (HOSPITAL_BASED_OUTPATIENT_CLINIC_OR_DEPARTMENT_OTHER)

## 2024-05-21 DIAGNOSIS — K603 Anal fistula, unspecified: Secondary | ICD-10-CM | POA: Diagnosis not present

## 2024-05-21 DIAGNOSIS — K611 Rectal abscess: Secondary | ICD-10-CM | POA: Diagnosis present

## 2024-05-21 LAB — COMPREHENSIVE METABOLIC PANEL WITH GFR
ALT: 9 U/L (ref 0–44)
AST: 16 U/L (ref 15–41)
Albumin: 3.8 g/dL (ref 3.5–5.0)
Alkaline Phosphatase: 59 U/L (ref 38–126)
Anion gap: 9 (ref 5–15)
BUN: 9 mg/dL (ref 6–20)
CO2: 22 mmol/L (ref 22–32)
Calcium: 8.8 mg/dL — ABNORMAL LOW (ref 8.9–10.3)
Chloride: 109 mmol/L (ref 98–111)
Creatinine, Ser: 0.56 mg/dL (ref 0.44–1.00)
GFR, Estimated: >60 mL/min (ref >60)
Glucose, Bld: 86 mg/dL (ref 70–99)
Potassium: 3.9 mmol/L (ref 3.5–5.1)
Sodium: 140 mmol/L (ref 135–145)
Total Bilirubin: 0.4 mg/dL (ref 0.0–1.2)
Total Protein: 6.5 g/dL (ref 6.5–8.1)

## 2024-05-21 LAB — CBC WITH DIFFERENTIAL/PLATELET
Abs Immature Granulocytes: 0.01 10*3/uL (ref 0.00–0.07)
Basophils Absolute: 0 10*3/uL (ref 0.0–0.1)
Basophils Relative: 1 %
Eosinophils Absolute: 0.1 10*3/uL (ref 0.0–0.5)
Eosinophils Relative: 1 %
HCT: 38.6 % (ref 36.0–46.0)
Hemoglobin: 12.7 g/dL (ref 12.0–15.0)
Immature Granulocytes: 0 %
Lymphocytes Relative: 22 %
Lymphs Abs: 1.2 10*3/uL (ref 0.7–4.0)
MCH: 27.2 pg (ref 26.0–34.0)
MCHC: 32.9 g/dL (ref 30.0–36.0)
MCV: 82.7 fL (ref 80.0–100.0)
Monocytes Absolute: 0.5 10*3/uL (ref 0.1–1.0)
Monocytes Relative: 8 %
Neutro Abs: 3.9 10*3/uL (ref 1.7–7.7)
Neutrophils Relative %: 68 %
Platelets: 230 10*3/uL (ref 150–400)
RBC: 4.67 MIL/uL (ref 3.87–5.11)
RDW: 15.1 % (ref 11.5–15.5)
WBC: 5.7 10*3/uL (ref 4.0–10.5)
nRBC: 0 % (ref 0.0–0.2)

## 2024-05-21 MED ORDER — DOXYCYCLINE HYCLATE 100 MG PO CAPS: 100.0000 mg | ORAL_CAPSULE | Freq: Two times a day (BID) | ORAL | 0 refills | Status: AC

## 2024-05-21 MED ORDER — DOXYCYCLINE HYCLATE 100 MG PO CAPS
100.0000 mg | ORAL_CAPSULE | Freq: Two times a day (BID) | ORAL | 0 refills | Status: DC
Start: 2024-05-21 — End: 2024-05-21

## 2024-05-21 MED ORDER — IOHEXOL 300 MG/ML  SOLN
100.0000 mL | Freq: Once | INTRAMUSCULAR | Status: AC | PRN
  Administered 2024-05-21: 90 mL via INTRAVENOUS

## 2024-05-21 NOTE — ED Notes (Signed)
 Called CT to ask when patient would be transported to CT. Reports she is next.

## 2024-05-21 NOTE — Discharge Instructions (Addendum)
 You were seen today for anal fistula.  It appears that a second anal fistula is present today separate from the one with a seton drain in place.  Will recommend that you continue follow-up with general surgery for further evaluation.  Please use warm compresses over the area to assist with further draining of the abscess, I am also putting you on antibiotics for you to take for the next 7 days.  Please return to ED if you begin to have any new or worsening symptoms which would include uncontrolled pain, fever, shortness of breath, chest pain, abdominal pain.

## 2024-05-21 NOTE — ED Provider Notes (Signed)
 [place] EMERGENCY DEPARTMENT AT [facility] Provider Note   CSN: [id number] Arrival date & time: 05/21/24  1452     Patient presents with: Abscess (Perirectal )   FOSTER SONNIER is a 38 y.o. female.   Abscess Patient is a 38 year old female presents to the ED today complaining of rectal abscess that been present for the past 1.5 to 2 weeks, noting that she has had a previous history of abscesses,  anal fistulas and has currently a seton drain in place that is post to be in place indefinitely, MS.  Contacted general surgery today after she noticed that the abscess started draining, they stated to come to the ED to be evaluated as they not have room for them today.  Noticed some relief since the abscess started draining yesterday.  However still feels that it is incompletely drained at this time.  Reports that she also recently saw neurology and was given 1000 mg dose of prednisone  x 5 days for MS flareup 1 month ago.  Also recently provided Augmentin  for sinusitis on 05/04/2024 and completed full course.  Denies fever, headache, chest pain, shortness breath, abdominal pain, nausea, vomiting, diarrhea, hematochezia, melena, dysuria, vaginal bleeding, vaginal discharge.      Prior to Admission medications   Medication Sig Start Date End Date Taking? Authorizing Provider  ALPRAZolam  (XANAX ) 0.5 MG tablet Take 1-2 tablets by mouth about 30 min prior to MRI. Can take additional tablet at time of test if needed. Must have driver to and from test, can cause drowsiness. 04/15/24   Sater, Sherida Dimmer, MD  amoxicillin -clavulanate (AUGMENTIN ) 875-125 MG tablet Take 1 tablet by mouth every 12 (twelve) hours. 05/04/24   Starlene Eaton, FNP  baclofen  (LIORESAL ) 10 MG tablet Take 1 tablet (10 mg total) by mouth 3 (three) times daily as needed for muscle spasms. Patient not taking: Reported on 04/05/2024 01/15/24   Jorie Newness, MD  benzonatate  (TESSALON ) 100 MG capsule Take 1 capsule  (100 mg total) by mouth every 8 (eight) hours. 05/04/24   Starlene Eaton, FNP  Copper  Gluconate (COPPER  CAPS) 2 MG CAPS Take 2 mg by mouth daily.    [provider]  cyanocobalamin  (VITAMIN B12) 1000 MCG/ML injection INJECT 1 ML INTO THE SKIN EVERY 4 WEEKS 01/15/24   Sater, Sherida Dimmer, MD  Docusate Sodium  (DSS) 100 MG CAPS Take 100 mg by mouth as directed. 03/26/18   [provider]  doxycycline  (VIBRAMYCIN ) 100 MG capsule Take 1 capsule (100 mg total) by mouth 2 (two) times daily for 7 days. 05/21/24 05/28/24  Macy Polio S, PA-C  ferrous sulfate  325 (65 FE) MG tablet Take 325 mg by mouth daily with breakfast.    [provider]  modafinil  (PROVIGIL ) 200 MG tablet Take 1 tablet (200 mg total) by mouth daily. 12/17/23   Sater, Sherida Dimmer, MD  Multiple Vitamin (MULTIVITAMIN WITH MINERALS) TABS tablet Take 1 tablet by mouth daily. 02/26/21   Haydee Lipa, MD  NIFEdipine  (ADALAT  CC) 30 MG 24 hr tablet Take 1 tablet (30 mg total) by mouth daily. 12/29/22   Luan Rumpf, MD  NIFEdipine  (PROCARDIA -XL/NIFEDICAL-XL) 30 MG 24 hr tablet Take 30 mg by mouth daily. 01/13/23   [provider]  norethindrone (MICRONOR) 0.35 MG tablet Take 1 tablet by mouth daily. 05/05/18   [provider]  nortriptyline  (PAMELOR ) 25 MG capsule Take 25 mg by mouth at bedtime. 04/06/24   [provider]  nystatin ointment (MYCOSTATIN) Apply 1 Application  topically daily as needed (Rash). 12/06/22   [provider]  oxybutynin  (DITROPAN  XL) 15 MG 24 hr tablet Take 15 mg by mouth at bedtime. 03/11/24   [provider]  oxybutynin  (DITROPAN ) 5 MG tablet one po tid Patient taking differently: Take 15 mg by mouth at bedtime. one po tid 12/17/23   Sater, Sherida Dimmer, MD  polyethylene glycol (MIRALAX  / GLYCOLAX ) 17 g packet Take 17 g by mouth as directed. 03/04/18   [provider]  predniSONE  (DELTASONE ) 50 MG tablet 20 tablets (1000 mg) po every day x 4 d for MS  exacerbation. Take with food.  May split over the day 04/05/24   Sater, Sherida Dimmer, MD  sertraline  (ZOLOFT ) 50 MG tablet Take 1 tablet (50 mg total) by mouth daily. 03/01/24   Sater, Sherida Dimmer, MD  Vitamin D , Ergocalciferol , (DRISDOL ) 1.25 MG (50000 UNIT) CAPS capsule Take 1 capsule (50,000 Units total) by mouth every 7 (seven) days. 12/18/23   Sater, Sherida Dimmer, MD  ZEPOSIA  0.92 MG CAPS TAKE 1 CAPSULE BY MOUTH 1 TIME A DAY 02/24/24   Sater, Sherida Dimmer, MD    Allergies: Nsaids, Sulfa antibiotics, and Tramadol    Review of Systems  Skin:  Positive for wound.  All other systems reviewed and are negative.   Updated Vital Signs BP 100/68   Pulse 60   Temp 98.3 F (36.8 C)   Resp 14   LMP 04/08/2024 (Exact Date)   SpO2 100%   Physical Exam Vitals and nursing note reviewed. Exam conducted with a chaperone present.  Constitutional:      General: She is not in acute distress.    Appearance: Normal appearance. She is not ill-appearing or diaphoretic.  HENT:     Head: Normocephalic and atraumatic.   Eyes:     General:        Right eye: No discharge.        Left eye: No discharge.     Extraocular Movements: Extraocular movements intact.     Conjunctiva/sclera: Conjunctivae normal.    Cardiovascular:     Rate and Rhythm: Normal rate and regular rhythm.     Pulses: Normal pulses.     Heart sounds: Normal heart sounds. No murmur heard.    No friction rub. No gallop.  Pulmonary:     Effort: Pulmonary effort is normal. No respiratory distress.     Breath sounds: Normal breath sounds. No stridor. No wheezing, rhonchi or rales.  Abdominal:     General: Abdomen is flat. There is no distension.     Palpations: Abdomen is soft.     Tenderness: There is no abdominal tenderness. There is no guarding or rebound.   Skin:    General: Skin is warm and dry.     Findings: Lesion (2 cm x 4 cm area of fluctuance noted to the right perianal space, noting to be actively draining at this time, seton drain  in place) present.   Neurological:     General: No focal deficit present.     Mental Status: She is alert. Mental status is at baseline.     Motor: No weakness.     Gait: Gait normal.   Psychiatric:        Mood and Affect: Mood normal.     (all labs ordered are listed, but only abnormal results are displayed) Labs Reviewed  COMPREHENSIVE METABOLIC PANEL WITH GFR - Abnormal; Notable for the following components:      Result Value  Calcium  8.8 (*)    All other components within normal limits  CBC WITH DIFFERENTIAL/PLATELET    EKG: None  Radiology: CT PELVIS W CONTRAST Result Date: 05/21/2024 CLINICAL DATA:  Perianal abscess EXAM: CT PELVIS WITH CONTRAST TECHNIQUE: Multidetector CT imaging of the pelvis was performed using the standard protocol following the bolus administration of intravenous contrast. RADIATION DOSE REDUCTION: This exam was performed according to the departmental dose-optimization program which includes automated exposure control, adjustment of the mA and/or kV according to patient size and/or use of iterative reconstruction technique. CONTRAST:  90mL OMNIPAQUE  IOHEXOL  300 MG/ML  SOLN COMPARISON:  None Available. FINDINGS: Urinary Tract:  No abnormality visualized. Bowel: Anastomotic staple line noted within the left lower quadrant. The visualized large and small bowel are otherwise unremarkable. No evidence of obstruction. No free intraperitoneal fluid within the pelvis. Vascular/Lymphatic: No pathologically enlarged lymph nodes. No significant vascular abnormality seen. Reproductive: Tubal ligation clip noted on the left. The pelvic organs are otherwise unremarkable. Other: A surgical fistula seton is seen within the right perianal soft tissues. Inferior to this within the right perianal soft tissues is an additional fistula with a superimposed 1.2 x 3.5 cm subcutaneous abscess, best seen on image # [id number], series # 2. No abdominal wall hernia. Musculoskeletal: No acute  bone abnormality. No lytic or blastic bone lesion. Osseous structures are age appropriate. IMPRESSION: 1. Right perianal fistula with a superimposed 1.2 x 3.5 cm subcutaneous abscess within the right perianal soft tissues. 2. Surgical seton noted within a separate right perianal fistula. No associated fluid collection with this lesion. Electronically Signed   By: Worthy Heads M.D.   On: 05/21/2024 19:50     Procedures   Medications Ordered in the ED  iohexol  (OMNIPAQUE ) 300 MG/ML solution 100 mL (90 mLs Intravenous Contrast Given 05/21/24 1815)                                   Medical Decision Making Amount and/or Complexity of Data Reviewed Labs: ordered. Radiology: ordered.  Risk Prescription drug management.   This patient is a 38 year old female who presents to the ED for concern of perineal abscess, recurrent with chronic seton drain in place, noted to have been recently treated with Augmentin  and high-dose prednisone  within the last month.  Abscess is already draining at this time but still reporting incomplete drainage.  Called general surgery today and was told to come to the ED for evaluation due to being unable to be seen in clinic today.  On physical exam, patient is in no acute distress, afebrile, alert and orient x 4, speaking in full sentences, nontachypneic, nontachycardic.  Notable perianal abscess noted to the left gluteus.  Area of fluctuance noted to be approximately 2 x 4 cm.  Drain in place, active purulent draining from abscess present.  CT scan was done to evaluate for possible recurrent fistula/deep space infection.  Labs are unremarkable. On reevaluation, patient is still doing well, abscess still draining.  CT scan did reveal second fistula.  Will have her follow-up with general surgery for this as well as being placed on doxycycline .  Patient did not want any further breaking up of loculations of the abscess and preferred to be seen by general surgery and use  warm compresses in the interim.  Patient's white count is unremarkable at this time, low suspicion for any systemic infection at this time.  Patient vital signs have remained  stable throughout the course of patient's time in the ED. Low suspicion for any other emergent pathology at this time. I believe this patient is safe to be discharged. Provided strict return to ER precautions. Patient expressed agreement and understanding of plan. All questions were answered.   Differential diagnoses prior to evaluation: The emergent differential diagnosis includes, but is not limited to, abscess, fistula, cellulitis, necrotizing fasciitis, gangrene. This is not an exhaustive differential.   Past Medical History / Co-morbidities / Social History: MS, GERD, dysrhythmia, migraines, hemorrhoids, anal fissure  Additional history: Chart reviewed. Pertinent results include:    Seen on 05/04/24 for acute nonrecurrent maxillary sinusitis, provided Tessalon  and Augmentin  at that time.  Lab Tests/Imaging studies: I personally interpreted labs/imaging and the pertinent results include:    CBC unremarkable CMP unremarkable. CT scan showed right perianal fistula with superimposed 1.2 x 3.5 cm subcutaneous abscess with right perianal soft tissue, also noting a separate right perianal fistula with surgical seton in place.  No associated fluid collection.  I agree with the radiologist interpretation.    Medications: I ordered medication including doxycycline .  I have reviewed the patients home medicines and have made adjustments as needed.  Critical Interventions: none  Social Determinants of Health:  Patient noted to have good follow-up with general surgery, already in contact with surgical office  Disposition: After consideration of the diagnostic results and the patients response to treatment, I feel that the patient would benefit from discharge and treatment noted above.   emergency department workup  does not suggest an emergent condition requiring admission or immediate intervention beyond what has been performed at this time. The plan is: Follow-up with general surgery, putting on doxycycline  for abscess, warm compresses, return to the ED for new or worsening symptoms. The patient is safe for discharge and has been instructed to return immediately for worsening symptoms, change in symptoms or any other concerns.     Final diagnoses:  Anal fistula    ED Discharge Orders          Ordered    doxycycline  (VIBRAMYCIN ) 100 MG capsule  2 times daily,   Status:  Discontinued        05/21/24 2010    doxycycline  (VIBRAMYCIN ) 100 MG capsule  2 times daily        05/21/24 2011               Hayes Lipps, PA-C 05/21/24 2014    Tegeler, Marine Sia, MD 05/21/24 [phone]

## 2024-05-21 NOTE — ED Triage Notes (Signed)
 Pt c/o perirectal abscess x1.5-2wks, started draining yesterday drained a good bit, but I feel like there's still stuff in there.   Hx anal fissure, drain placement r/t perirectal abscess.   Unable to get in w gen surg

## 2024-05-28 ENCOUNTER — Other Ambulatory Visit (HOSPITAL_COMMUNITY): Payer: Self-pay

## 2024-05-28 ENCOUNTER — Telehealth: Payer: Self-pay

## 2024-05-28 NOTE — Telephone Encounter (Signed)
   Pharmacy Patient Advocate Encounter   Received notification from Patient Pharmacy that prior authorization for Modafinil  is required/requested.   Insurance verification completed.   The patient is insured through [facility] [employer] .   Per test claim: The current N/A day co-pay is, $N/A.  No PA needed at this time. This test claim was processed through [facility]- copay amounts may vary at other pharmacies due to pharmacy/plan contracts, or as the patient moves through the different stages of their insurance plan.

## 2024-06-05 ENCOUNTER — Other Ambulatory Visit: Payer: Self-pay | Admitting: Neurology

## 2024-06-06 ENCOUNTER — Other Ambulatory Visit: Payer: Self-pay | Admitting: Neurology

## 2024-06-07 NOTE — Telephone Encounter (Signed)
 Last seen on 04/05/24 Follow up scheduled on 07/14/24

## 2024-06-09 ENCOUNTER — Encounter: Payer: Self-pay | Admitting: Neurology

## 2024-06-10 ENCOUNTER — Other Ambulatory Visit: Payer: Self-pay | Admitting: Neurology

## 2024-06-10 NOTE — Telephone Encounter (Signed)
 Last seen 04/05/24 Follow up scheduled on 07/14/24   Did you want patient to continue RX?

## 2024-07-04 ENCOUNTER — Other Ambulatory Visit: Payer: Self-pay | Admitting: Neurology

## 2024-07-05 ENCOUNTER — Other Ambulatory Visit: Payer: Self-pay

## 2024-07-06 NOTE — Telephone Encounter (Signed)
 Last seen on 04/05/24 Follow up scheduled on 11/03/24

## 2024-07-11 ENCOUNTER — Other Ambulatory Visit: Payer: Self-pay | Admitting: Neurology

## 2024-07-12 ENCOUNTER — Telehealth: Payer: Self-pay | Admitting: Neurology

## 2024-07-12 ENCOUNTER — Encounter: Payer: Self-pay | Admitting: *Deleted

## 2024-07-12 NOTE — Telephone Encounter (Signed)
 Pt has called Maurilio, RN back

## 2024-07-12 NOTE — Telephone Encounter (Signed)
 Pt is asking for a call to discuss her sight in right eye going out about 2 hours ago

## 2024-07-12 NOTE — Telephone Encounter (Signed)
 Pt called and spoke w/ Olam. She accepted appt and has been scheduled.

## 2024-07-12 NOTE — Telephone Encounter (Signed)
 Spoke w/ Dr. Vear who wants to work pt in tomorrow for evaluation/possible IV steroids. I called pt and LVM for her to call.

## 2024-07-12 NOTE — Telephone Encounter (Signed)
 Pt last seen 04/05/24. Next f/u 11/03/24. MS DMT: Zeposia .  I called pt at [phone]. Having vision changes in R eye. Cloudy. Sx started within last 2 hours. No infection/illness currently. No new meds started recently. Still taking Zeposia , no missed doses. Last eye appt over a year ago. Encouraged her to schedule eye appt at her earliest convenience and that she should have one at least once a yr. Aware I will speak with MD and call her back.

## 2024-07-12 NOTE — Telephone Encounter (Signed)
 Last seen on 04/05/24 Follow up scheduled on 07/13/24

## 2024-07-13 ENCOUNTER — Encounter: Payer: Self-pay | Admitting: Neurology

## 2024-07-13 ENCOUNTER — Ambulatory Visit (INDEPENDENT_AMBULATORY_CARE_PROVIDER_SITE_OTHER): Admitting: Neurology

## 2024-07-13 VITALS — BP 115/78 | HR 63 | Ht 71.0 in | Wt 260.4 lb

## 2024-07-13 DIAGNOSIS — Z9884 Bariatric surgery status: Secondary | ICD-10-CM

## 2024-07-13 DIAGNOSIS — E559 Vitamin D deficiency, unspecified: Secondary | ICD-10-CM

## 2024-07-13 DIAGNOSIS — G4489 Other headache syndrome: Secondary | ICD-10-CM

## 2024-07-13 DIAGNOSIS — G35 Multiple sclerosis: Secondary | ICD-10-CM | POA: Diagnosis not present

## 2024-07-13 DIAGNOSIS — N3941 Urge incontinence: Secondary | ICD-10-CM

## 2024-07-13 DIAGNOSIS — E538 Deficiency of other specified B group vitamins: Secondary | ICD-10-CM

## 2024-07-13 DIAGNOSIS — E610 Copper deficiency: Secondary | ICD-10-CM

## 2024-07-13 DIAGNOSIS — Z79899 Other long term (current) drug therapy: Secondary | ICD-10-CM

## 2024-07-13 MED ORDER — KETOROLAC TROMETHAMINE 60 MG/2ML IM SOLN
60.0000 mg | Freq: Once | INTRAMUSCULAR | Status: AC
  Administered 2024-07-13 (×2): 60 mg via INTRAMUSCULAR

## 2024-07-13 NOTE — Progress Notes (Signed)
 [facility]  PATIENT: Lynn Roberts DOB: 1986/02/27  REFERRING DOCTOR OR PCP:  Dr. Lonni Fairly (Ophthalmology); Eligio Lav, MD (Neuro-hospitalist); Camelia Novak (PCP) SOURCE:   _________________________________   HISTORICAL  CHIEF COMPLAINT:  Chief Complaint  Patient presents with   Follow-up    Pt in room 11. Alone. Here for MS follow up on Zeposia  . Pt reports yesterday she noticed swirls in right eye,right frontal headache, took tylenol . No falls, last eye exam was 3 years.     HISTORY OF PRESENT ILLNESS:  Lynn Roberts, is a 38 y.o. woman with relapsing remitting multiple sclerosis.   UPDATE 07/13/2024 She is noting swirly vision OD and right eye pain. She has old reduced color saturation OD which is unchanged from baseline.   The HA is right sided in and around the eye.  No change with eye movements.   She took Tylenol  without benefit for the HA.  REst did not help.     This is different than an episode in April 2025 when 'She noted some sparkles in her vision yesterday - like fireflies and involving both eyes.   She had increased pain and swelling May 2025.   MRI brain and spine showed  no new lesions  I messaged with her:   Unclear if the symptoms she had affecting her vision last month were an MS exacerbation.  She feels she is back to baseline.  For now we will continue Zeposia  but consider a switch to an anti-CD20 or natalizumab if she has additional lesions.  Will recheck the MRI next year.  Gait, strengh and sensation are unchanged.   She has urinary urgency with some incontinence.   This is worse the last couple weeks.   Conservative methods and medications like oxybutynin  have not been beneficial.  She also has had bowel incontinence x 5 the last 2 weeks.   No numbness or weakness in limbs.  No neck or back pain.    She has had bariatric surgery (duodenal switch) and lost 400+ pounds (650 maximum)  During the pregnancy (delivered  daughter 12/2022) she was on Copaxone  and she went back on Zeposia  mid February 2024.  MRI 01/15/2024 showed one new MS focus compared to 02/22/2024 (after she restarted med's).     She had Vitamin A  deficiency during her pregnancy.   She has a h/o gastric bypass and has had B12, copper  and Vit D deficiency.     Vit D was low at 14 and we started 50000 Units weekly - she will change to  5000 daily.  She is tolerating Zeposia  well.  She feels her MS is stable.    She states her gait is stable and she has no difficulty on even surfaces.  She walks more slowly with longer distance.     Balance is slightly off but not poor.   She uses the bannister on stairs.  No falls this year.   She feels strength is doing well.   No muscle spasms.   She denies tingling or numbness.     She presented with left optic neuritis.  Vision improved.   Still ha smild reduced VA and color saturation OS..   She still sees  firefly like floaters off/on.       She has urinary frequency and rare urge incontinence.  Oxybutynin  has helped and we will chang eto tid as bid sometimes wears off.  The immediate release preparation seemed to help her better than the extended  release.   She is sleeping well most nights but has daily fatigue.    She does not have excessive daytime sleepiness.  Mood is a little worse and sometimes she feels things get to her more.   More stress with her husband had a suicide attempt in March..  He is doing better.    She has occasional nervousness but no severe anxiety.    She is on sertraline .   Cognition is doing well.    She has returned to work 04/14/2023.   She feels more tired since returning to work, some days much more than others.   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    MS History: She presented to [facility] 02/22/2021 after she had the onset of left eye pain, headache and complete loss of vision OS 02/21/2021.  She saw Dr. Loreli (ophthalmology) who advised her to go to the ED.     She had studies and was admitted and received 5 days of IV steroids.  MRI of the orbits confirmed left optic neuritis.  MRI of the brain showed multiple foci consistent with MS.  2 foci in the left hemisphere enhanced after contrast.  MRI of the spine did not show any additional lesions though there was extensive movement artifact that limited the study.   She had no other neurologic episode in the past.   She began to improve a day after the IV Solu-Medrol  was initiated and was much better by discharge.      She started Zeposia  04/2021.    She became pregnant and stopped Zeposia  05/2021 (was 7-[redacted] weeks pregnant at time)   She started Copaxone  06/2022.    She went back to Zeposia  01/2023.      Other Medical:  She was > 650 pounds and had bariatric surgery (duodenal switch), losing > 400 pounds in 2015.   She had an anal fissure last year requiring surgery and still has some mild bleeding.   She has seen surgery and they have requested a colonoscopy.     She smokes and is trying to quit (using patches).   She has two kids ages 1 and 3.       Imaging:  MRI brain 04/22/2024.  There were no new lesions in the brain  MRI cervical and thoracic spine 04/22/2024:  No lesions in spinal cord.   No significant DJD>    MRI brain 01/2023 showed no new lesions  MRI of the brain 02/22/2021 shows multiple T2/FLAIR hyperintense foci in the hemispheres in a pattern consistent with multiple sclerosis.  2 foci on the left show subtle enhancement after contrast.  MRI of the orbits 02/22/2021 shows enhancement of the left optic nerve.  MRI of the cervical spine 02/23/2021 limited by movement artifact shows a normal spinal cord and mild multilevel degenerative changes.  MRI of the thoracic spine 02/23/2021 limited by movement artifact shows a normal spinal cord and degenerative changes with mild spinal stenosis at T11-T12 and T12-L1.   Laboratory tests March 2022:   Anti-NMO and anti-MOG are negative.  B12 low at 152 (180-914).    EKG normal 2/.2021  REVIEW OF SYSTEMS: Constitutional: No fevers, chills, sweats, or change in appetite Eyes: No visual changes, double vision, eye pain Ear, nose and throat: No hearing loss, ear pain, nasal congestion, sore throat Cardiovascular: No chest pain, palpitations Respiratory:  No shortness of breath at rest or with exertion.   No wheezes GastrointestinaI: No  nausea, vomiting, diarrhea, abdominal pain, fecal incontinence Genitourinary:  No dysuria, urinary retention or frequency.  No nocturia. Musculoskeletal:  No neck pain, back pain Integumentary: No rash, pruritus, skin lesions Neurological: as above Psychiatric: No depression at this time.  No anxiety Endocrine: No palpitations, diaphoresis, change in appetite, change in weigh or increased thirst Hematologic/Lymphatic:  No anemia, purpura, petechiae. Allergic/Immunologic: No itchy/runny eyes, nasal congestion, recent allergic reactions, rashes  ALLERGIES: Allergies  Allergen Reactions   Nsaids Other (See Comments)    HX. OF WEIGHT-LOSS SURGERY   Sulfa Antibiotics Rash   Tramadol Rash    Had after 2014 and tolerated okay    HOME MEDICATIONS:  Current Outpatient Medications:    Copper  Gluconate (COPPER  CAPS) 2 MG CAPS, Take 2 mg by mouth daily., Disp: , Rfl:    cyanocobalamin  (VITAMIN B12) 1000 MCG/ML injection, INJECT 1 ML INTO THE SKIN EVERY 4 WEEKS, Disp: 3 mL, Rfl: 0   ferrous sulfate  325 (65 FE) MG tablet, Take 325 mg by mouth daily with breakfast., Disp: , Rfl:    modafinil  (PROVIGIL ) 200 MG tablet, Take 1 tablet (200 mg total) by mouth daily., Disp: 30 tablet, Rfl: 5   Multiple Vitamin (MULTIVITAMIN WITH MINERALS) TABS tablet, Take 1 tablet by mouth daily., Disp: 30 tablet, Rfl: 0   NIFEdipine  (ADALAT  CC) 30 MG 24 hr tablet, Take 1 tablet (30 mg total) by mouth daily., Disp: 30 tablet, Rfl: 0   NIFEdipine  (PROCARDIA -XL/NIFEDICAL-XL) 30 MG 24 hr tablet, Take 30 mg by mouth daily., Disp: , Rfl:     nortriptyline  (PAMELOR ) 25 MG capsule, TAKE 1 CAPSULE BY MOUTH AT BEDTIME., Disp: 30 capsule, Rfl: 5   nystatin ointment (MYCOSTATIN), Apply 1 Application topically daily as needed (Rash)., Disp: , Rfl:    oxybutynin  (DITROPAN  XL) 15 MG 24 hr tablet, TAKE 1 TABLET (15 MG TOTAL) BY MOUTH IN THE MORNING, AT NOON, AND AT BEDTIME., Disp: 270 tablet, Rfl: 1   oxybutynin  (DITROPAN ) 5 MG tablet, one po tid (Patient taking differently: Take 15 mg by mouth at bedtime. one po tid), Disp: 270 tablet, Rfl: 3   sertraline  (ZOLOFT ) 50 MG tablet, TAKE 1 TABLET BY MOUTH EVERY DAY, Disp: 90 tablet, Rfl: 0   Vitamin D , Ergocalciferol , (DRISDOL ) 1.25 MG (50000 UNIT) CAPS capsule, Take 1 capsule (50,000 Units total) by mouth every 7 (seven) days., Disp: 13 capsule, Rfl: 3   ZEPOSIA  0.92 MG CAPS, TAKE 1 CAPSULE BY MOUTH 1 TIME A DAY, Disp: 90 capsule, Rfl: 1   ALPRAZolam  (XANAX ) 0.5 MG tablet, Take 1-2 tablets by mouth about 30 min prior to MRI. Can take additional tablet at time of test if needed. Must have driver to and from test, can cause drowsiness. (Patient not taking: Reported on 07/13/2024), Disp: 3 tablet, Rfl: 0   amoxicillin -clavulanate (AUGMENTIN ) 875-125 MG tablet, Take 1 tablet by mouth every 12 (twelve) hours. (Patient not taking: Reported on 07/13/2024), Disp: 14 tablet, Rfl: 0   baclofen  (LIORESAL ) 10 MG tablet, Take 1 tablet (10 mg total) by mouth 3 (three) times daily as needed for muscle spasms. (Patient not taking: Reported on 07/13/2024), Disp: 30 each, Rfl: 0   benzonatate  (TESSALON ) 100 MG capsule, Take 1 capsule (100 mg total) by mouth every 8 (eight) hours. (Patient not taking: Reported on 07/13/2024), Disp: 21 capsule, Rfl: 0   Docusate Sodium  (DSS) 100 MG CAPS, Take 100 mg by mouth as directed. (Patient not taking: Reported on 07/13/2024), Disp: , Rfl:    norethindrone (MICRONOR) 0.35  MG tablet, Take 1 tablet by mouth daily. (Patient not taking: Reported on 07/13/2024), Disp: , Rfl:    polyethylene  glycol (MIRALAX  / GLYCOLAX ) 17 g packet, Take 17 g by mouth as directed. (Patient not taking: Reported on 07/13/2024), Disp: , Rfl:    predniSONE  (DELTASONE ) 50 MG tablet, 20 tablets (1000 mg) po every day x 4 d for MS exacerbation. Take with food.  May split over the day (Patient not taking: Reported on 07/13/2024), Disp: 80 tablet, Rfl: 0  PAST MEDICAL HISTORY: Past Medical History:  Diagnosis Date   Arthritis of low back    Complication of anesthesia    is of [place] descent   Dysrhythmia    hx PAT   Family history of adverse reaction to anesthesia    pt is a [place]   GERD (gastroesophageal reflux disease)    none since pregnancy    Heart murmur    When pt. was pregnant with her last child, and also had murmur as a child.     Hemorrhoids    History of anal fissures    History of PAT (paroxysmal atrial tachycardia)    had cardiac ablation as a teenager   History of sleep apnea    none since 450 pound weight loss in 2015   Migraines    MS (multiple sclerosis) (HCC)    PCOD (polycystic ovarian disease)    no menses    PAST SURGICAL HISTORY: Past Surgical History:  Procedure Laterality Date   BREAST LUMPECTOMY Left 2011   papilloma   BREAST LUMPECTOMY Left 2015   papilloma   BREAST LUMPECTOMY WITH RADIOACTIVE SEED LOCALIZATION Left 10/18/2014   Procedure:  RADIOACTIVE SEED GUIDED EXCISIONAL BREAST BIOPSY;  Surgeon: Alm Angle, MD;  Location: [facility];  Service: General;  Laterality: Left;   BREAST SURGERY Right 07/27/2004   retro-areolar dissection with exc. of large duct   CARDIAC ELECTROPHYSIOLOGY MAPPING AND ABLATION  sge 13   CARDIAC ELECTROPHYSIOLOGY STUDY AND ABLATION  age 64   CESAREAN SECTION N/A 03/28/2020   Procedure: CESAREAN SECTION;  Surgeon: Sudie Lavonia HERO, MD;  Location: [facility];  Service: Obstetrics;  Laterality: N/A;  RNFA Tracey   CESAREAN SECTION  03/2018   CESAREAN SECTION WITH BILATERAL TUBAL LIGATION Bilateral 12/27/2022    Procedure: CESAREAN SECTION WITH BILATERAL TUBAL LIGATION;  Surgeon: Sudie Lavonia HERO, MD;  Location: [facility];  Service: Obstetrics;  Laterality: Bilateral;  incr r/o malignant hyperthermia with isoflurane anesthetic induction as patient is [place]   CHOLECYSTECTOMY  04/27/2008   EVALUATION UNDER ANESTHESIA WITH ANAL FISTULECTOMY N/A 02/10/2023   Procedure: CONTROL OF ANAL FISTULAS WITH PLACEMENT OF SETON AND ANORECTAL EXAM UNDER ANESTHESIA;  Surgeon: Teresa Lonni HERO, MD;  Location: [facility];  Service: General;  Laterality: N/A;   GASTROPLASTY DUODENAL SWITCH  01/2014   done at [facility]   INCISION AND DRAINAGE ABSCESS Bilateral 10/11/2022   Procedure: Incision and drainage of Bilateral Perirectoal abscess, Possible Seton;  Surgeon: Ebbie Cough, MD;  Location: [facility];  Service: General;  Laterality: Bilateral;   INCISION AND DRAINAGE PERIRECTAL ABSCESS N/A 08/15/2020   Procedure: ANORECTAL EXAM UNDER ANESTHESIA INCISION AND DRAINAGE OF PERIRECTAL ABSCESS x2 WITH PLACEMENT OF SETON;  Surgeon: Teresa Lonni HERO, MD;  Location: [facility];  Service: General;  Laterality: N/A;   OVARIAN CYST REMOVAL Bilateral 1999   PLACEMENT OF SETON  08/15/2020   PLACEMENT OF SETON N/A 12/08/2020   Procedure: PLACEMENT OF CUTTING SETON;  PARTIAL FISTULOTOMY;  Surgeon: Teresa Lonni HERO, MD;  Location: [facility];  Service: General;  Laterality: N/A;   RECTAL EXAM UNDER ANESTHESIA N/A 12/08/2020   Procedure: ANORECTAL EXAM UNDER ANESTHESIA;  Surgeon: Teresa Lonni HERO, MD;  Location: [facility];  Service: General;  Laterality: N/A;   TOENAIL EXCISION  yrs ago   UNILATERAL SALPINGECTOMY      FAMILY HISTORY: Family History  Problem Relation Age of Onset   Hypertension Mother    Diabetes Mother    Hypertension Father    Hyperlipidemia Father    Cancer Maternal Grandmother        multiple myaloma, breast cancer   Heart disease Maternal Grandmother    Diabetes Maternal  Grandfather    Cancer Paternal Grandfather        pancreactic   Colon cancer Neg Hx    Stomach cancer Neg Hx    Rectal cancer Neg Hx    Esophageal cancer Neg Hx     SOCIAL HISTORY:  Social History   Socioeconomic History   Marital status: Married    Spouse name: Toribio   Number of children: 2   Years of education: 12   Highest education level: Not on file  Occupational History   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    Lives with husband   Caffeine use: 1 cup per day of tea   Pt works    Social Drivers of Corporate investment banker Strain: Not on file  Food Insecurity: No Food Insecurity (12/30/2022)   Hunger Vital Sign    Worried About Running Out of Food in the Last Year: Never true    Ran Out of Food in the Last Year: Never true  Transportation Needs: No Transportation Needs (12/30/2022)   PRAPARE - Administrator, Civil Service (Medical): No    Lack of Transportation (Non-Medical): No  Physical Activity: Not on file  Stress: Not on file  Social Connections: Unknown (04/01/2022)   [facility]   Social Network    Social Network: Not on file  Intimate Partner Violence: Not At Risk (12/27/2022)   Humiliation, Afraid, Rape, and Kick questionnaire    Fear of Current or Ex-Partner: No    Emotionally Abused: No    Physically Abused: No    Sexually Abused: No     PHYSICAL EXAM  Vitals:   07/13/24 0931  BP: 115/78  Pulse: 63  SpO2: 97%   Weight: 260 lb 6.4 oz (118.1 kg)  Height: 5' 11 (1.803 m)    Body mass index is 36.32 kg/m.   VA: OD:   20/40-1 OS:   20/30  Colors desat'd OD   General: The patient is well-developed and well-nourished and in no acute distress  HEENT:  Head is [place]/AT.  Sclera are anicteric.  Skin: Extremities are without rash or edema.  Neurologic Exam  Mental status: The patient is alert and oriented x 3 at the time of the examination. The patient has apparent normal recent and remote memory, with an apparently normal attention span and concentration ability.   Speech is normal.  Cranial nerves: Extraocular movements are full.  Reduced color vision OS.  Visual acuity is fairly symmetrical.  There is good facial sensation to soft touch bilaterally.Facial strength is normal.  No obvious hearing deficits are noted.  Motor:  Muscle bulk is normal.   Tone is normal. Strength is  5 / 5 in all 4 extremities.   Sensory: Sensory testing is intact to pinprick, soft touch and vibration sensation in all 4 extremities.  Coordination: Cerebellar testing reveals good finger-nose-finger and heel-to-shin bilaterally.  Gait and station: Station is normal.   Gait is normal.  Her tandem gait is mildly wide.  Reflexes: Deep tendon reflexes are symmetric and normal bilaterally.      DIAGNOSTIC DATA (LABS, IMAGING, TESTING) - I reviewed patient records, labs, notes, testing and imaging myself where available.  Lab Results  Component Value Date   WBC 5.7 05/21/2024   HGB 12.7 05/21/2024   HCT 38.6 05/21/2024   MCV 82.7 05/21/2024   PLT 230 05/21/2024      Component Value Date/Time   NA 140 05/21/2024 1511   NA 142 05/28/2023 1600   K 3.9 05/21/2024 1511   CL 109 05/21/2024 1511   CO2 22 05/21/2024 1511   GLUCOSE 86 05/21/2024 1511   BUN 9 05/21/2024 1511   BUN 7 05/28/2023 1600   CREATININE 0.56 05/21/2024 1511   CREATININE 0.62 03/03/2020 1452   CALCIUM  8.8 (L) 05/21/2024 1511   PROT 6.5  05/21/2024 1511   PROT 6.1 05/28/2023 1600   ALBUMIN 3.8 05/21/2024 1511   ALBUMIN 3.9 05/28/2023 1600   AST 16 05/21/2024 1511   AST 10 (L) 03/03/2020 1452   ALT 9 05/21/2024 1511   ALT 10 03/03/2020 1452   ALKPHOS 59 05/21/2024 1511   BILITOT 0.4 05/21/2024 1511   BILITOT 0.4 05/28/2023 1600   BILITOT 0.4 03/03/2020 1452   GFRNONAA >60 05/21/2024 1511   GFRNONAA >60 03/03/2020 1452   GFRAA >60 08/15/2020 0354   GFRAA >60 03/03/2020 1452   No results found for: CHOL, HDL, LDLCALC, LDLDIRECT, TRIG, CHOLHDL Lab Results  Component Value Date   HGBA1C 4.6 (L) 11/05/2017   Lab Results  Component Value Date   VITAMINB12 450 04/05/2024   Lab Results  Component Value Date   TSH 1.620 05/28/2023       ASSESSMENT AND PLAN  Other headache syndrome - Plan: ketorolac  (TORADOL ) injection 60 mg  Multiple sclerosis (HCC)  Urge incontinence  S/P bariatric surgery  High risk medication use  B12 deficiency  Copper  deficiency  Vitamin D  deficiency   She has some visual symptoms but not necessarily an exacerbation.  They could also be associated with migraine headache. To help with the head pain she received 60 mg of Toradol  IM using sterile technique.  She tolerated the injection well. She has had gastric bypass/duodenal switch.  Check thiamine Vit D, B12 and copper  to r/o deficency as cause of symptoms. .   I also advised her to see ophthalmology to make sure that she is not having a retinal detachment or other intraocular problem  She needs incontinence supplies.  She has daily urinary incontinence.  Conservative methods (training, timed voids) have not been  beneficial.  She has not had good response to medications like oxybutynin   Toradol  60 mg (lot [id number] 11/2024) injection today.  She tolerated it well Nurtec 4 pills Lot [id number]   10/2027.  She will take these if the headache returns. Rtc 6 months or call sooner if new or worsening issues.     40-minute  office visit with the majority of the time spent face-to-face for history and physical, discussion/counseling and decision-making.  Additional time with record review and documentation.   Lynn Roberts A. Vear, MD, [facility] 07/13/2024, 12:51 PM Certified in Neurology, Clinical Neurophysiology, Sleep Medicine and Neuroimaging  [facility] [address], [address] [place], [place] [address] [phone]

## 2024-07-14 ENCOUNTER — Ambulatory Visit: Payer: No Typology Code available for payment source | Admitting: Neurology

## 2024-07-20 ENCOUNTER — Encounter: Payer: Self-pay | Admitting: Hematology and Oncology

## 2024-07-28 ENCOUNTER — Telehealth: Payer: Self-pay | Admitting: *Deleted

## 2024-07-28 NOTE — Telephone Encounter (Signed)
 Faxed signed orders back, received fax confirmation.

## 2024-08-03 ENCOUNTER — Encounter: Payer: Self-pay | Admitting: Hematology and Oncology

## 2024-08-06 ENCOUNTER — Encounter (HOSPITAL_BASED_OUTPATIENT_CLINIC_OR_DEPARTMENT_OTHER): Payer: Self-pay | Admitting: Surgery

## 2024-08-06 ENCOUNTER — Other Ambulatory Visit: Payer: Self-pay

## 2024-08-06 NOTE — Progress Notes (Signed)
   08/06/24 1503  PAT Phone Screen  Is the patient taking a GLP-1 receptor agonist? No  Do You Have Diabetes? No  Do You Have Hypertension? No  Have You Ever Been to the ER for Asthma? No  Have You Taken Oral Steroids in the Past 3 Months? No  Do you Take Phenteramine or any Other Diet Drugs? No  Recent  Lab Work, EKG, CXR? No  Do you have a history of heart problems? No  Cardiologist Name Seen by [facility] 06/20/22 during last pregnancy for palpitations.  Have you ever had tests on your heart? Yes  What cardiac tests were performed? Other (comment);Echo  What date/year were cardiac tests completed? zio monitor 07/2022- results nsr w/ a 6 & 9 beat run of svt over the 2 week study. Smoking cessation recommended f/u prn Echo 2020 EF 60-65%  Results viewable: [facility] Media Tab  Any Recent Hospitalizations? No  Height 5' 11 (1.803 m)  Weight 118 kg  Pat Appointment Scheduled Yes (ekg)

## 2024-08-12 ENCOUNTER — Encounter: Payer: Self-pay | Admitting: Hematology and Oncology

## 2024-08-12 ENCOUNTER — Ambulatory Visit: Payer: Self-pay | Admitting: Surgery

## 2024-08-12 ENCOUNTER — Encounter (HOSPITAL_BASED_OUTPATIENT_CLINIC_OR_DEPARTMENT_OTHER)
Admission: RE | Admit: 2024-08-12 | Discharge: 2024-08-12 | Disposition: A | Discharge status: Home / Self Care | Source: Ambulatory Visit | Attending: Surgery | Admitting: Surgery

## 2024-08-12 DIAGNOSIS — F17210 Nicotine dependence, cigarettes, uncomplicated: Secondary | ICD-10-CM | POA: Diagnosis not present

## 2024-08-12 DIAGNOSIS — Z8679 Personal history of other diseases of the circulatory system: Secondary | ICD-10-CM | POA: Diagnosis not present

## 2024-08-12 DIAGNOSIS — G4730 Sleep apnea, unspecified: Secondary | ICD-10-CM | POA: Diagnosis not present

## 2024-08-12 DIAGNOSIS — I10 Essential (primary) hypertension: Secondary | ICD-10-CM | POA: Diagnosis not present

## 2024-08-12 DIAGNOSIS — K60329 Anal fistula, complex, unspecified: Secondary | ICD-10-CM | POA: Diagnosis present

## 2024-08-12 DIAGNOSIS — K644 Residual hemorrhoidal skin tags: Secondary | ICD-10-CM | POA: Diagnosis not present

## 2024-08-12 DIAGNOSIS — G35 Multiple sclerosis: Secondary | ICD-10-CM | POA: Diagnosis not present

## 2024-08-12 MED ORDER — CHLORHEXIDINE GLUCONATE CLOTH 2 % EX PADS: 6.0000 | MEDICATED_PAD | Freq: Once | CUTANEOUS | Status: DC

## 2024-08-12 NOTE — Anesthesia Preprocedure Evaluation (Signed)
 Anesthesia Evaluation  Patient identified by MRN, date of birth, ID band Patient awake    Reviewed: Allergy & Precautions, NPO status , Patient's Chart, lab work & pertinent test results  History of Anesthesia Complications (+) Family history of anesthesia reaction and history of anesthetic complications  Airway Mallampati: I  TM Distance: >3 FB Neck ROM: Full    Dental no notable dental hx. (+) Teeth Intact, Dental Advisory Given   Pulmonary sleep apnea , Current SmokerPatient did not abstain from smoking.   Pulmonary exam normal breath sounds clear to auscultation       Cardiovascular hypertension, Normal cardiovascular exam+ dysrhythmias  Rhythm:Regular Rate:Normal     Neuro/Psych  Headaches MS    GI/Hepatic Neg liver ROS,,,  Endo/Other  negative endocrine ROS    Renal/GU negative Renal ROS  negative genitourinary   Musculoskeletal   Abdominal   Peds  Hematology   Anesthesia Other Findings All: NSAIDS, sulfa, tramadol  Reproductive/Obstetrics negative OB ROS                              Anesthesia Physical Anesthesia Plan  ASA: 3  Anesthesia Plan: General   Post-op Pain Management: Precedex  and Tylenol  PO (pre-op)*   Induction: Intravenous  PONV Risk Score and Plan: 3 and Treatment may vary due to age or medical condition, Midazolam , Ondansetron , Dexamethasone , Propofol  infusion and TIVA  Airway Management Planned: Oral ETT  Additional Equipment: None  Intra-op Plan:   Post-operative Plan: Extubation in OR  Informed Consent:      Dental advisory given  Plan Discussed with: CRNA and Surgeon  Anesthesia Plan Comments: (TIVA GA )         Anesthesia Quick Evaluation

## 2024-08-12 NOTE — Progress Notes (Signed)
 Surgical soap given with instructions, pt verbalized understanding. Fleets enema given with instructions.

## 2024-08-13 ENCOUNTER — Ambulatory Visit (HOSPITAL_BASED_OUTPATIENT_CLINIC_OR_DEPARTMENT_OTHER): Payer: Self-pay | Admitting: Anesthesiology

## 2024-08-13 ENCOUNTER — Encounter (HOSPITAL_BASED_OUTPATIENT_CLINIC_OR_DEPARTMENT_OTHER)
Admission: RE | Disposition: A | Discharge status: Home / Self Care | Payer: Self-pay | Source: Home / Self Care | Attending: Surgery

## 2024-08-13 ENCOUNTER — Other Ambulatory Visit: Payer: Self-pay

## 2024-08-13 ENCOUNTER — Ambulatory Visit (HOSPITAL_BASED_OUTPATIENT_CLINIC_OR_DEPARTMENT_OTHER)
Admission: RE | Admit: 2024-08-13 | Discharge: 2024-08-13 | Disposition: A | Discharge status: Home / Self Care | Attending: Surgery | Admitting: Surgery

## 2024-08-13 ENCOUNTER — Encounter (HOSPITAL_BASED_OUTPATIENT_CLINIC_OR_DEPARTMENT_OTHER): Payer: Self-pay | Admitting: Surgery

## 2024-08-13 DIAGNOSIS — G35 Multiple sclerosis: Secondary | ICD-10-CM | POA: Insufficient documentation

## 2024-08-13 DIAGNOSIS — K644 Residual hemorrhoidal skin tags: Secondary | ICD-10-CM | POA: Insufficient documentation

## 2024-08-13 DIAGNOSIS — K60329 Anal fistula, complex, unspecified: Secondary | ICD-10-CM | POA: Insufficient documentation

## 2024-08-13 DIAGNOSIS — F17210 Nicotine dependence, cigarettes, uncomplicated: Secondary | ICD-10-CM | POA: Insufficient documentation

## 2024-08-13 DIAGNOSIS — I10 Essential (primary) hypertension: Secondary | ICD-10-CM

## 2024-08-13 DIAGNOSIS — G4730 Sleep apnea, unspecified: Secondary | ICD-10-CM | POA: Insufficient documentation

## 2024-08-13 DIAGNOSIS — Z8679 Personal history of other diseases of the circulatory system: Secondary | ICD-10-CM | POA: Insufficient documentation

## 2024-08-13 DIAGNOSIS — Z01818 Encounter for other preprocedural examination: Secondary | ICD-10-CM

## 2024-08-13 HISTORY — PX: LIGATION OF INTERNAL FISTULA TRACT: SHX6551

## 2024-08-13 HISTORY — PX: RECTAL EXAM UNDER ANESTHESIA: SHX6399

## 2024-08-13 LAB — POCT PREGNANCY, URINE: Preg Test, Ur: NEGATIVE

## 2024-08-13 SURGERY — LIGATION, INTERNAL FISTULA TRACT
Anesthesia: General | Site: Rectum

## 2024-08-13 MED ORDER — FENTANYL CITRATE (PF) 100 MCG/2ML IJ SOLN
INTRAMUSCULAR | Status: AC
  Filled 2024-08-13: qty 2

## 2024-08-13 MED ORDER — SODIUM CHLORIDE 0.9 % IV SOLN
INTRAVENOUS | Status: AC
Start: 2024-08-13 — End: 2024-08-13
  Filled 2024-08-13: qty 2

## 2024-08-13 MED ORDER — SUGAMMADEX SODIUM 200 MG/2ML IV SOLN
INTRAVENOUS | Status: DC | PRN
  Administered 2024-08-13: 200 mg via INTRAVENOUS

## 2024-08-13 MED ORDER — LIDOCAINE 2% (20 MG/ML) 5 ML SYRINGE
INTRAMUSCULAR | Status: DC | PRN
  Administered 2024-08-13: 100 mg via INTRAVENOUS

## 2024-08-13 MED ORDER — OXYCODONE HCL 5 MG/5ML PO SOLN: 5.0000 mg | Freq: Once | ORAL | Status: DC | PRN

## 2024-08-13 MED ORDER — PROPOFOL 500 MG/50ML IV EMUL
INTRAVENOUS | Status: AC
  Filled 2024-08-13: qty 50

## 2024-08-13 MED ORDER — ONDANSETRON HCL 4 MG/2ML IJ SOLN: 4.0000 mg | Freq: Once | INTRAMUSCULAR | Status: DC | PRN

## 2024-08-13 MED ORDER — KETOROLAC TROMETHAMINE 30 MG/ML IJ SOLN: 30.0000 mg | Freq: Once | INTRAMUSCULAR | Status: DC | PRN

## 2024-08-13 MED ORDER — ROCURONIUM BROMIDE 10 MG/ML (PF) SYRINGE
PREFILLED_SYRINGE | INTRAVENOUS | Status: DC | PRN
  Administered 2024-08-13: 60 mg via INTRAVENOUS

## 2024-08-13 MED ORDER — ACETAMINOPHEN 500 MG PO TABS
1000.0000 mg | ORAL_TABLET | ORAL | Status: AC
Start: 2024-08-13 — End: 2024-08-13
  Administered 2024-08-13: 1000 mg via ORAL

## 2024-08-13 MED ORDER — LIDOCAINE 2% (20 MG/ML) 5 ML SYRINGE
INTRAMUSCULAR | Status: AC
  Filled 2024-08-13: qty 5

## 2024-08-13 MED ORDER — DEXMEDETOMIDINE HCL IN NACL 80 MCG/20ML IV SOLN
INTRAVENOUS | Status: AC
  Filled 2024-08-13: qty 20

## 2024-08-13 MED ORDER — ONDANSETRON HCL 4 MG/2ML IJ SOLN
INTRAMUSCULAR | Status: DC | PRN
  Administered 2024-08-13: 4 mg via INTRAVENOUS

## 2024-08-13 MED ORDER — DEXAMETHASONE SODIUM PHOSPHATE 10 MG/ML IJ SOLN
INTRAMUSCULAR | Status: DC | PRN
  Administered 2024-08-13: 10 mg via INTRAVENOUS

## 2024-08-13 MED ORDER — LACTATED RINGERS IV SOLN: INTRAVENOUS | Status: DC

## 2024-08-13 MED ORDER — SODIUM CHLORIDE 0.9 % IV SOLN
2.0000 g | INTRAVENOUS | Status: AC
  Administered 2024-08-13: 2 g via INTRAVENOUS

## 2024-08-13 MED ORDER — ONDANSETRON HCL 4 MG/2ML IJ SOLN
INTRAMUSCULAR | Status: AC
Start: 2024-08-13 — End: 2024-08-13
  Filled 2024-08-13: qty 2

## 2024-08-13 MED ORDER — FENTANYL CITRATE (PF) 100 MCG/2ML IJ SOLN
INTRAMUSCULAR | Status: DC | PRN
  Administered 2024-08-13: 50 ug via INTRAVENOUS
  Administered 2024-08-13 (×2): 25 ug via INTRAVENOUS

## 2024-08-13 MED ORDER — SUCCINYLCHOLINE CHLORIDE 200 MG/10ML IV SOSY
PREFILLED_SYRINGE | INTRAVENOUS | Status: AC
  Filled 2024-08-13: qty 10

## 2024-08-13 MED ORDER — PROPOFOL 10 MG/ML IV BOLUS
INTRAVENOUS | Status: DC | PRN
Start: 2024-08-13 — End: 2024-08-13
  Administered 2024-08-13: 150 mg via INTRAVENOUS
  Administered 2024-08-13: 50 mg via INTRAVENOUS

## 2024-08-13 MED ORDER — PROPOFOL 500 MG/50ML IV EMUL
INTRAVENOUS | Status: AC
Start: 2024-08-13 — End: 2024-08-13
  Filled 2024-08-13: qty 50

## 2024-08-13 MED ORDER — MIDAZOLAM HCL 2 MG/2ML IJ SOLN
INTRAMUSCULAR | Status: AC
  Filled 2024-08-13: qty 2

## 2024-08-13 MED ORDER — DEXAMETHASONE SODIUM PHOSPHATE 10 MG/ML IJ SOLN
INTRAMUSCULAR | Status: AC
  Filled 2024-08-13: qty 1

## 2024-08-13 MED ORDER — OXYCODONE HCL 5 MG PO TABS: 5.0000 mg | ORAL_TABLET | Freq: Four times a day (QID) | ORAL | 0 refills | Status: DC | PRN

## 2024-08-13 MED ORDER — 0.9 % SODIUM CHLORIDE (POUR BTL) OPTIME
TOPICAL | Status: DC | PRN
  Administered 2024-08-13: 1000 mL

## 2024-08-13 MED ORDER — BUPIVACAINE-EPINEPHRINE 0.25% -1:200000 IJ SOLN
INTRAMUSCULAR | Status: DC | PRN
  Administered 2024-08-13: 30 mL

## 2024-08-13 MED ORDER — MIDAZOLAM HCL 2 MG/2ML IJ SOLN
INTRAMUSCULAR | Status: DC | PRN
  Administered 2024-08-13: 2 mg via INTRAVENOUS

## 2024-08-13 MED ORDER — FLEET ENEMA RE ENEM
ENEMA | RECTAL | Status: AC
  Filled 2024-08-13: qty 1

## 2024-08-13 MED ORDER — OXYCODONE HCL 5 MG PO TABS: 5.0000 mg | ORAL_TABLET | Freq: Once | ORAL | Status: DC | PRN

## 2024-08-13 MED ORDER — PROPOFOL 500 MG/50ML IV EMUL
INTRAVENOUS | Status: DC | PRN
Start: 2024-08-13 — End: 2024-08-13
  Administered 2024-08-13: 150 ug/kg/min via INTRAVENOUS

## 2024-08-13 MED ORDER — DEXMEDETOMIDINE HCL IN NACL 80 MCG/20ML IV SOLN
INTRAVENOUS | Status: DC | PRN
  Administered 2024-08-13: 12 ug via INTRAVENOUS

## 2024-08-13 MED ORDER — HYDROMORPHONE HCL 1 MG/ML IJ SOLN: 0.2500 mg | INTRAMUSCULAR | Status: DC | PRN

## 2024-08-13 MED ORDER — ACETAMINOPHEN 500 MG PO TABS
ORAL_TABLET | ORAL | Status: AC
  Filled 2024-08-13: qty 2

## 2024-08-13 SURGICAL SUPPLY — 58 items
BENZOIN TINCTURE PRP APPL 2/3 (GAUZE/BANDAGES/DRESSINGS) ×3 IMPLANT
BLADE EXTENDED COATED 6.5IN (ELECTRODE) IMPLANT
BLADE SURG 10 STRL SS (BLADE) IMPLANT
BLADE SURG 15 STRL LF DISP TIS (BLADE) ×2 IMPLANT
BRIEF MESH DISP LRG (UNDERPADS AND DIAPERS) ×2 IMPLANT
COVER BACK TABLE 60X90IN (DRAPES) ×2 IMPLANT
COVER MAYO STAND STRL (DRAPES) ×2 IMPLANT
DRAPE LAPAROTOMY 100X72 PEDS (DRAPES) ×2 IMPLANT
DRAPE UTILITY XL STRL (DRAPES) ×2 IMPLANT
ELECT NDL BLADE 2-5/6 (NEEDLE) ×1 IMPLANT
ELECT NEEDLE BLADE 2-5/6 (NEEDLE) IMPLANT
GAUZE 4X4 16PLY ~~LOC~~+RFID DBL (SPONGE) ×1 IMPLANT
GAUZE PAD ABD 8X10 STRL (GAUZE/BANDAGES/DRESSINGS) ×2 IMPLANT
GAUZE SPONGE 4X4 12PLY STRL (GAUZE/BANDAGES/DRESSINGS) ×2 IMPLANT
GLOVE BIO SURGEON STRL SZ7.5 (GLOVE) ×2 IMPLANT
GLOVE BIOGEL PI IND STRL 7.5 (GLOVE) ×3 IMPLANT
GLOVE INDICATOR 8.0 STRL GRN (GLOVE) ×2 IMPLANT
GLOVE SURG SS PI 6.5 STRL IVOR (GLOVE) ×1 IMPLANT
GOWN STRL REUS W/ TWL XL LVL3 (GOWN DISPOSABLE) ×1 IMPLANT
GOWN STRL REUS W/TWL XL LVL3 (GOWN DISPOSABLE) ×2 IMPLANT
HOOK RETRACT STAY BLUNT 12 (MISCELLANEOUS) ×1 IMPLANT
HYDROGEN PEROXIDE 16OZ (MISCELLANEOUS) IMPLANT
IV CATH 14GX2 1/4 (CATHETERS) IMPLANT
IV CATH 18G SAFETY (IV SOLUTION) IMPLANT
KIT SIGMOIDOSCOPE (SET/KITS/TRAYS/PACK) IMPLANT
KIT TURNOVER CYSTO (KITS) ×1 IMPLANT
LOOP VASCLR MAXI BLUE 18IN ST (MISCELLANEOUS) IMPLANT
NDL HYPO 22X1.5 SAFETY MO (MISCELLANEOUS) ×1 IMPLANT
NDL SAFETY ECLIPSE 18X1.5 (NEEDLE) IMPLANT
NEEDLE HYPO 22X1.5 SAFETY MO (MISCELLANEOUS) ×2 IMPLANT
NS IRRIG 500ML POUR BTL (IV SOLUTION) ×1 IMPLANT
PACK BASIN DAY SURGERY FS (CUSTOM PROCEDURE TRAY) ×2 IMPLANT
PENCIL SMOKE EVACUATOR (MISCELLANEOUS) ×2 IMPLANT
RETRACTOR RING URO 16.6X16.6 (MISCELLANEOUS) ×3 IMPLANT
RETRACTOR STAY HOOK 5MM (MISCELLANEOUS) ×2 IMPLANT
SLEEVE SCD COMPRESS KNEE MED (STOCKING) ×2 IMPLANT
SPIKE FLUID TRANSFER (MISCELLANEOUS) ×2 IMPLANT
SPONGE SURGIFOAM ABS GEL 12-7 (HEMOSTASIS) IMPLANT
SUCTION TUBE FRAZIER 10FR DISP (SUCTIONS) ×1 IMPLANT
SUT CHROMIC 2 0 SH (SUTURE) IMPLANT
SUT CHROMIC 3 0 SH 27 (SUTURE) ×1 IMPLANT
SUT MNCRL AB 4-0 PS2 18 (SUTURE) IMPLANT
SUT SILK 0 TIES 10X30 (SUTURE) IMPLANT
SUT SILK 2-0 18XBRD TIE 12 (SUTURE) IMPLANT
SUT VIC AB 2-0 SH 27XBRD (SUTURE) IMPLANT
SUT VIC AB 2-0 UR6 27 (SUTURE) ×4 IMPLANT
SUT VIC AB 3-0 SH 18 (SUTURE) IMPLANT
SUT VIC AB 3-0 SH 27X BRD (SUTURE) ×1 IMPLANT
SUT VIC AB 3-0 SH 27XBRD (SUTURE) IMPLANT
SUT VICRYL 0 UR6 27IN ABS (SUTURE) IMPLANT
SUT VICRYL 2 0 18 UND BR (SUTURE) IMPLANT
SYR 20ML LL LF (SYRINGE) IMPLANT
SYR BULB IRRIG 60ML STRL (SYRINGE) ×2 IMPLANT
SYR CONTROL 10ML LL (SYRINGE) ×2 IMPLANT
TOWEL OR 17X24 6PK STRL BLUE (TOWEL DISPOSABLE) ×1 IMPLANT
TRAY DSU PREP LF (CUSTOM PROCEDURE TRAY) ×2 IMPLANT
TUBE CONNECTING 12X1/4 (SUCTIONS) ×2 IMPLANT
YANKAUER SUCT BULB TIP NO VENT (SUCTIONS) ×1 IMPLANT

## 2024-08-13 NOTE — Transfer of Care (Signed)
 Immediate Anesthesia Transfer of Care Note  Patient: Lynn Roberts  Procedure(s) Performed: LIGATION, INTERNAL FISTULA TRACT (Rectum) EXAM UNDER ANESTHESIA, RECTUM (Rectum)  Patient Location: PACU  Anesthesia Type:General  Level of Consciousness: awake, alert , and oriented  Airway & Oxygen  Therapy: Patient Spontanous Breathing and Patient connected to face mask oxygen   Post-op Assessment: Report given to RN and Post -op Vital signs reviewed and stable  Post vital signs: Reviewed and stable  Last Vitals:  Vitals Value Taken Time  BP 112/73   Temp 36.5 C 08/13/24 08:50  Pulse 64 08/13/24 08:50  Resp 19 08/13/24 08:50  SpO2 100 % 08/13/24 08:50    Last Pain:  Vitals:   08/13/24 0650  TempSrc: Temporal  PainSc: 0-No pain      Patients Stated Pain Goal: 5 (08/13/24 0650)  Complications: No notable events documented.

## 2024-08-13 NOTE — Discharge Instructions (Addendum)
 ANORECTAL SURGERY: POST OP INSTRUCTIONS  DIET: Follow a light bland diet the first 24 hours after arrival home, such as soup, liquids, crackers, etc.  Be sure to include lots of fluids daily.  Avoid fast food or heavy meals as your are more likely to get nauseated.  Eat a low fat diet the next few days after surgery.   Some bleeding with bowel movements is expected for the first couple of days but this should stop in between bowel movements  Take your usually prescribed home medications unless otherwise directed. No foreign bodies per rectum for the next 3 months (enemas, etc)  PAIN CONTROL: It is helpful to take an over-the-counter pain medication regularly for the first few days/weeks.  Choose from the following that works best for you: Ibuprofen  (Advil , etc) Three 200mg  tabs every 6 hours as needed. Acetaminophen  (Tylenol , etc) 500-650mg  every 6 hours as needed NOTE: You may take both of these medications together - most patients find it most helpful when alternating between the two (i.e. Ibuprofen  at 6am, tylenol  at 9am, ibuprofen  at 12pm ..SABRA) A  prescription for pain medication may have been prescribed for you at discharge.  Take your pain medication as prescribed.  If you are having problems/concerns with the prescription medicine, please call us  for further advice.  Avoid getting constipated.  Between the surgery and the pain medications, it is common to experience some constipation.  Increasing fluid intake (64oz of water  per day) and taking a fiber supplement (such as Metamucil, Citrucel, FiberCon) 1-2 times a day regularly will usually help prevent this problem from occurring.  Take Miralax  (over the counter) 1-2x/day while taking a narcotic pain medication. If no bowel movement after 48hours, you may additionally take a laxative like a bottle of Milk of Magnesia which can be purchased over the counter. Avoid enemas.   Watch out for diarrhea.  If you have many loose bowel movements,  simplify your diet to bland foods.  Stop any stool softeners and decrease your fiber supplement. If this worsens or does not improve, please call us .  Wash / shower every day.  If you were discharged with a dressing, you may remove this the day after your surgery. You may shower normally, getting soap/water  on your wound, particularly after bowel movements.  Soaking in a warm bath filled a couple inches (Sitz bath) is a great way to clean the area after a bowel movement and many patients find it is a way to soothe the area.  ACTIVITIES as tolerated:   You may resume regular (light) daily activities beginning the next day--such as daily self-care, walking, climbing stairs--gradually increasing activities as tolerated.  If you can walk 30 minutes without difficulty, it is safe to try more intense activity such as jogging, treadmill, bicycling, low-impact aerobics, etc. Refrain from any heavy lifting or straining for the first 2 weeks after your procedure, particularly if your surgery was for hemorrhoids. Avoid activities that make your pain worse You may drive when you are no longer taking prescription pain medication, you can comfortably wear a seatbelt, and you can safely maneuver your car and apply brakes.  FOLLOW UP in our office Please call [facility] at [phone] to set up an appointment to see your surgeon in the office for a follow-up appointment approximately 2 weeks after your surgery. Make sure that you call for this appointment the day you arrive home to insure a convenient appointment time.  9. If you have disability or family leave forms  that need to be completed, you may have them completed by your primary care physician's office; for return to work instructions, please ask our office staff and they will be happy to assist you in obtaining this documentation   When to call us  [phone]: Poor pain control Reactions / problems with new medications (rash/itching, etc)  Fever over  101.5 F (38.5 C) Inability to urinate Nausea/vomiting Worsening swelling or bruising Continued bleeding from incision. Increased pain, redness, or drainage from the incision  The clinic staff is available to answer your questions during regular business hours (8:30am-5pm).  Please don't hesitate to call and ask to speak to one of our nurses for clinical concerns.   A surgeon from [facility] is always on call at the hospitals   If you have a medical emergency, go to the nearest emergency room or call 911.   [facility] [address], [place], [place]  [address] MAIN: [phone] FAX: [phone] [email or web address]   No tylenol  until 1pm   Post Anesthesia Home Care Instructions  Activity: Get plenty of rest for the remainder of the day. A responsible individual must stay with you for 24 hours following the procedure.  For the next 24 hours, DO NOT: -Drive a car -Advertising copywriter -Drink alcoholic beverages -Take any medication unless instructed by your physician -Make any legal decisions or sign important papers.  Meals: Start with liquid foods such as gelatin or soup. Progress to regular foods as tolerated. Avoid greasy, spicy, heavy foods. If nausea and/or vomiting occur, drink only clear liquids until the nausea and/or vomiting subsides. Call your physician if vomiting continues.  Special Instructions/Symptoms: Your throat may feel dry or sore from the anesthesia or the breathing tube placed in your throat during surgery. If this causes discomfort, gargle with warm salt water . The discomfort should disappear within 24 hours.  If you had a scopolamine  patch placed behind your ear for the management of post- operative nausea and/or vomiting:  1. The medication in the patch is effective for 72 hours, after which it should be removed.  Wrap patch in a tissue and discard in the trash. Wash hands  thoroughly with soap and water . 2. You may remove the patch earlier than 72 hours if you experience unpleasant side effects which may include dry mouth, dizziness or visual disturbances. 3. Avoid touching the patch. Wash your hands with soap and water  after contact with the patch.

## 2024-08-13 NOTE — Anesthesia Procedure Notes (Signed)
 Procedure Name: Intubation Date/Time: 08/13/2024 7:35 AM  Performed by: Casimir Lucie HERO, CRNAPre-anesthesia Checklist: Patient identified, Emergency Drugs available, Suction available and Patient being monitored Patient Re-evaluated:Patient Re-evaluated prior to induction Oxygen  Delivery Method: Circle system utilized Preoxygenation: Pre-oxygenation with 100% oxygen  Induction Type: IV induction Ventilation: Mask ventilation without difficulty Laryngoscope Size: Miller and 2 Grade View: Grade I Tube type: Oral Tube size: 7.0 mm Number of attempts: 1 Airway Equipment and Method: Stylet Placement Confirmation: ETT inserted through vocal cords under direct vision, positive ETCO2 and breath sounds checked- equal and bilateral Secured at: 21 cm Tube secured with: Tape Dental Injury: Teeth and Oropharynx as per pre-operative assessment

## 2024-08-13 NOTE — Anesthesia Postprocedure Evaluation (Signed)
 Anesthesia Post Note  Patient: Lynn Roberts  Procedure(s) Performed: LIGATION, INTERNAL FISTULA TRACT (Rectum) EXAM UNDER ANESTHESIA, RECTUM (Rectum)     Patient location during evaluation: PACU Anesthesia Type: General Level of consciousness: awake and alert Pain management: pain level controlled Vital Signs Assessment: post-procedure vital signs reviewed and stable Respiratory status: spontaneous breathing, nonlabored ventilation, respiratory function stable and patient connected to nasal cannula oxygen  Cardiovascular status: blood pressure returned to baseline and stable Postop Assessment: no apparent nausea or vomiting Anesthetic complications: no   No notable events documented.  Last Vitals:  Vitals:   08/13/24 0930 08/13/24 0941  BP: (!) 146/86 (!) 147/89  Pulse: 65 78  Resp: 14 20  Temp:  (!) 36.3 C  SpO2: 98% 97%    Last Pain:  Vitals:   08/13/24 0941  TempSrc: Temporal  PainSc: 0-No pain                 Garnette DELENA Gab

## 2024-08-13 NOTE — H&P (Signed)
 CC: Here today for surgery  HPI: AMIYRAH Roberts is an 38 y.o. female who presented to the hospital 08/14/20 with large perirectal abscess involving both ischiorectal fossa. She was admitted and started on antibiotics. She was taken to the OR by myself on 08/15/20 where she underwent a modified Hanley procedure. Found to have large bilateral ischiorectal fossa abscesses. Posterior midline transsphincteric fistula. A draining blue vessel loop seton was placed in the posterior midline anal fistula. She was admitted postoperatively where she recovered well. She was discharged home 08/17/20.   OR 12/08/20 -  1. Placement of cutting seton 2. Partial fistulotomy 3. Anorectal exam under anesthesia  Cscope 04/2021 Dr. San - - Hemorrhoids found on perianal exam. - One 3 mm polyp in the sigmoid colon, removed with a cold snare. Resected and retrieved. - Stool in the entire examined colon. - Non-bleeding internal hemorrhoids. - The examined portion of the ileum was normal.  Recurrent perirectal abscess 10/11/22 where Dr. Ebbie did I&D of bilateral left/right perirectal abscesses with placement of penrose   She has had a couple recurrent perianal abscesses over the last 2 to 3 months.   She has been enjoying time with her newest addition to her family-daughter named Lynn Roberts.  OR 02/10/23 -surgical treatment of transsphincteric fistula draining seton placement; anorectal exam under anesthesia FINDINGS -right ischial rectal scar with thinning of skin, no active abscess. Transsphincteric fistula-IO posterior midline, EO right posterior. Draining seton in place.  Returns for follow-up. She has been doing great. Quality life has improved significantly since having the seton placed. Enjoying time with her now with her infant daughter. No complaints at present. Drainage from the seton area has decreased to where it is minimal now.  INTERVAL HX Reports she had an MS flare and was on very high doses  of prednisone -1000 mg x 5 days. She had a lot of swelling throughout her much of her body related to this. Fractionally 2 weeks later she developed some firmness and swelling in the right posterior perianal region. Developed evident perianal swelling just beyond her seton and ultimately [facility].  CT pelvis: 1. Right perianal fistula with a superimposed 1.2 x 3.5 cm subcutaneous abscess within the right perianal soft tissues. 2. Surgical seton noted within a separate right perianal fistula. No associated fluid collection with this lesion.  She was prescribed doxycycline  and did have spontaneous drainage of purulent fluid but no I&D was carried out. When seen in our urgent office, she was changed over to Augmentin  which she completed still has some persistent drainage at present.  She denies any changes in health or health history since we met in the office. No new medications/allergies. She states she is ready for surgery today.  PMH: Denies  PSH: Duodenal switch 2015 at [facility] Bilateral breast surgeries, C-sx x2; cholecystectomy; cardiac ablation for PAT as teenager  FHx: Denies any known family history of colorectal, breast, endometrial or ovarian cancer  Social Hx: Active smoker, working on quitting.    Past Medical History:  Diagnosis Date   Arthritis of low back    Complication of anesthesia    [place] heritage   Dysrhythmia    hx PAT   Family history of adverse reaction to anesthesia    pt is a [place]   GERD (gastroesophageal reflux disease)    none since pregnancy    Heart murmur    When pt. was pregnant with her last child, and also had murmur  as a child.     Hemorrhoids    History of anal fissures    History of PAT (paroxysmal atrial tachycardia)    had cardiac ablation as a teenager   History of sleep apnea    none since 45 pound weight loss in 2015   Migraines    MS (multiple sclerosis) (HCC)    PCOD (polycystic  ovarian disease)    no menses    Past Surgical History:  Procedure Laterality Date   BREAST LUMPECTOMY Left 2011   papilloma   BREAST LUMPECTOMY Left 2015   papilloma   BREAST LUMPECTOMY WITH RADIOACTIVE SEED LOCALIZATION Left 10/18/2014   Procedure:  RADIOACTIVE SEED GUIDED EXCISIONAL BREAST BIOPSY;  Surgeon: Alm Angle, MD;  Location: [facility];  Service: General;  Laterality: Left;   BREAST SURGERY Right 07/27/2004   retro-areolar dissection with exc. of large duct   CARDIAC ELECTROPHYSIOLOGY MAPPING AND ABLATION  sge 13   CARDIAC ELECTROPHYSIOLOGY STUDY AND ABLATION  age 21   CESAREAN SECTION N/A 03/28/2020   Procedure: CESAREAN SECTION;  Surgeon: Sudie Lavonia HERO, MD;  Location: [facility];  Service: Obstetrics;  Laterality: N/A;  RNFA Tracey   CESAREAN SECTION  03/2018   CESAREAN SECTION WITH BILATERAL TUBAL LIGATION Bilateral 12/27/2022   Procedure: CESAREAN SECTION WITH BILATERAL TUBAL LIGATION;  Surgeon: Sudie Lavonia HERO, MD;  Location: [facility];  Service: Obstetrics;  Laterality: Bilateral;  incr r/o malignant hyperthermia with isoflurane anesthetic induction as patient is [place]   CHOLECYSTECTOMY  04/27/2008   EVALUATION UNDER ANESTHESIA WITH ANAL FISTULECTOMY N/A 02/10/2023   Procedure: CONTROL OF ANAL FISTULAS WITH PLACEMENT OF SETON AND ANORECTAL EXAM UNDER ANESTHESIA;  Surgeon: Teresa Lonni HERO, MD;  Location: [facility];  Service: General;  Laterality: N/A;   GASTROPLASTY DUODENAL SWITCH  01/2014   done at [facility]   INCISION AND DRAINAGE ABSCESS Bilateral 10/11/2022   Procedure: Incision and drainage of Bilateral Perirectoal abscess, Possible Seton;  Surgeon: Ebbie Cough, MD;  Location: [facility] OR;  Service: General;  Laterality: Bilateral;   INCISION AND DRAINAGE PERIRECTAL ABSCESS N/A 08/15/2020   Procedure: ANORECTAL EXAM UNDER ANESTHESIA INCISION AND DRAINAGE OF PERIRECTAL ABSCESS x2 WITH PLACEMENT OF SETON;  Surgeon: Teresa Lonni HERO, MD;   Location: [facility];  Service: General;  Laterality: N/A;   OVARIAN CYST REMOVAL Bilateral 1999   PLACEMENT OF SETON  08/15/2020   PLACEMENT OF SETON N/A 12/08/2020   Procedure: PLACEMENT OF CUTTING SETON; PARTIAL FISTULOTOMY;  Surgeon: Teresa Lonni HERO, MD;  Location: [facility];  Service: General;  Laterality: N/A;   RECTAL EXAM UNDER ANESTHESIA N/A 12/08/2020   Procedure: ANORECTAL EXAM UNDER ANESTHESIA;  Surgeon: Teresa Lonni HERO, MD;  Location: [facility];  Service: General;  Laterality: N/A;   TOENAIL EXCISION  yrs ago   UNILATERAL SALPINGECTOMY      Family History  Problem Relation Age of Onset   Hypertension Mother    Diabetes Mother    Hypertension Father    Hyperlipidemia Father    Cancer Maternal Grandmother        multiple myaloma, breast cancer   Heart disease Maternal Grandmother    Diabetes Maternal Grandfather    Cancer Paternal Grandfather        pancreactic   Colon cancer Neg Hx    Stomach cancer Neg Hx    Rectal cancer Neg Hx    Esophageal cancer Neg Hx     Social:  reports that  she has been smoking cigarettes. She has a 5 pack-year smoking history. She has never used smokeless tobacco. She reports that she does not drink alcohol and does not use drugs.  Allergies:  Allergies  Allergen Reactions   Nsaids Other (See Comments)    HX. OF WEIGHT-LOSS SURGERY   Sulfa Antibiotics Rash   Tramadol Rash    Had after 2014 and tolerated okay    Medications: I have reviewed the patient's current medications.  Results for orders placed or performed during the hospital encounter of 08/13/24 (from the past 48 hours)  Pregnancy, urine POC     Status: None   Collection Time: 08/13/24  7:02 AM  Result Value Ref Range   Preg Test, Ur NEGATIVE NEGATIVE    Comment:        THE SENSITIVITY OF THIS METHODOLOGY IS >20 mIU/mL.     No results found.   PE Blood pressure 123/70, pulse (!) 58, temperature 98.2 F (36.8 C), temperature  source Temporal, resp. rate 16, height 5' 11 (1.803 m), weight 113.9 kg, last menstrual period 08/11/2024, SpO2 100%, not currently breastfeeding. Constitutional: NAD; conversant Eyes: Moist conjunctiva; no lid lag; anicteric Lungs: Normal respiratory effort CV: RRR Psychiatric: Appropriate affect  Results for orders placed or performed during the hospital encounter of 08/13/24 (from the past 48 hours)  Pregnancy, urine POC     Status: None   Collection Time: 08/13/24  7:02 AM  Result Value Ref Range   Preg Test, Ur NEGATIVE NEGATIVE    Comment:        THE SENSITIVITY OF THIS METHODOLOGY IS >20 mIU/mL.     No results found.  A/P: SHARILYNN CASSITY is an 38 y.o. female with hx of horseshoe anal abscess/fistula with prior seton and subsequent cutting seton but now with recurrences   - I have strongly recommended she quit smoking and stop all nicotine  containing products. She has been working on the same pack of cigarettes for the last 2 weeks and is for left with plans to completely quit. We discussed the rationale therein including high risk for recurrent abscesses and anal fistula persistence. She expressed understanding.  -At this point she is interested in proceeding with attempts at definitive fistula management now that her daughter has gotten a bit older.  -The anatomy and physiology of the anal canal was discussed with the patient with associated pictures. The pathophysiology of anal abscess and fistula was discussed at length as well  -We have reviewed options going forward including further observation vs surgery -surgical treatment of transsphincteric anal fistula with-ligation of intersphincteric fistulous tract (LIFT) vs fistulotomy; anorectal exam under anesthesia  -The planned procedure, material risks (including, but not limited to, pain, bleeding, infection, scarring, need for blood transfusion, damage to anal sphincter, incontinence of gas and/or stool, need for  additional procedures, anal stenosis, rare cases of pelvic sepsis which in severe cases may require things like a colostomy, recurrence of fistula in ~20% of cases, pneumonia, heart attack, stroke, death) benefits and alternatives to surgery were discussed at length. We discussed success rates of the LIFT procedure of ~80%. The patient's questions were answered to her satisfaction, she voiced understanding and elected to proceed with surgery. Additionally, we discussed typical postoperative expectations and the recovery process.    Lonni Pizza, MD [facility]

## 2024-08-13 NOTE — Op Note (Signed)
 08/13/2024  8:43 AM  PATIENT:  Lynn Roberts  38 y.o. female  Patient Care Team: Pcp, No as PCP - General  PRE-OPERATIVE DIAGNOSIS: Transsphincteric anal fistula  POST-OPERATIVE DIAGNOSIS: Same  PROCEDURE:   1.  Ligation of intersphincteric fistula tract 2.  Anorectal exam under anesthesia  SURGEON:  Surgeon(s): Teresa Lonni HERO, MD  ASSISTANT: OR Staff   ANESTHESIA:   local and general  SPECIMEN:   None No Specimen  DISPOSITION OF SPECIMEN:  N/A  COUNTS:  Sponge, needle, and instrument counts were reported correct x2 at conclusion.  EBL: 2 mL  Drains: none  PLAN OF CARE: Discharge to home after PACU  PATIENT DISPOSITION:  PACU - hemodynamically stable.  OR FINDINGS: Right posterior transsphincteric anal fistula with external opening in the right posterior position; internal opening in the posterior midline.  Small skin tags on perianal exam.  Ligation of intersphincteric fistula tract carried out uneventfully.  DESCRIPTION: The patient was seen in the pre-op holding area. The risks, benefits, complications, treatment options, and expected outcomes were previously discussed with the patient. The patient agreed with the proposed plan and has signed the informed consent form. The patient was brought to the operating room by the surgical team, identified as Lynn Roberts, and the procedure verified. SCD's were applied. General anesthesia was induced without difficulty. The patient was then rolled onto the OR table in the prone jackknife position.  Pressure points were evaluated and padded.  Benzoin was applied to the buttocks and they were gently taped apart.  She was then prepped and draped in usual sterile fashion. A time out was completed and the above information confirmed and need for preoperative antibiotics.  A perianal block was then created using a dilute mixture of 0.25% Marcaine  with epinephrine .  After ascertaining an appropriate level of anesthesia  had been achieved, a well lubricated digital rectal exam was performed. This demonstrated no palpable masses or abnormalities.  A Hill-Ferguson anoscope was into the anal canal and circumferential inspection demonstrated healthy appearing anoderm.  Blue vessel loop seton emanating in the right posterior position with internal opening in the posterior midline.  No anal fissures.  No other perianal findings, fluctuance, purulent discharge, or any other wounds to suggest other fistula tracts.  The vessel loop seton is exchanged for a semirigid fistula probe.  The tract is then palpated and found to be transsphincteric in nature.  Local anesthetic is infiltrated at the intersphincteric groove.  The intersphincteric groove is again palpated.  A curvilinear incision is created over the intersphincteric groove.  The incision is deepened using electrocautery.  A [place] retractor was placed.  We worked in a plane between the sphincter complexes and therefore no sphincter muscle was divided.  Dissection is carried down to the level of the fistula tract.  This is then circumferentially dissected.  The tract is well-defined without any other sidebranches evident at this location.  After circumferentially controlling the tract, the probe was gently removed.  The tract is then ligated with hemostats.  It is then divided between clamps.  Each respective side of the fistula tract is then suture-ligated using figure-of-eight 2-0 Vicryl sutures x2 on each side.  The external opening is then recannulated.  We have successfully ligated and divided the fistula tract without any evident communication.  A crypt hook is then used to cannulate the internal opening.  This demonstrates the same findings without any visible separations in our ligation.  Hemostasis is verified.  The wound is  then closed in layers using 3-0 Vicryl sutures to reapproximate the intersphincteric plane.  The anoderm is then approximated using 3-0 chromic  sutures.  The external opening is then landscape to facilitate drainage.  All sponge, needle, and instrument counts are reported correct.  A dressing consisting of 4 x 4's, ABD, mesh underwear is ultimately placed.  She was rolled back onto a stretcher, awakened from anesthesia, extubated, and transported to recovery in satisfactory condition.  DISPOSITION: PACU in satisfactory condition.

## 2024-08-14 ENCOUNTER — Encounter (HOSPITAL_BASED_OUTPATIENT_CLINIC_OR_DEPARTMENT_OTHER): Payer: Self-pay | Admitting: Surgery

## 2024-08-16 ENCOUNTER — Other Ambulatory Visit: Payer: Self-pay | Admitting: Neurology

## 2024-08-16 NOTE — Telephone Encounter (Signed)
 Last seen on 07/13/24 Follow up scheduled on 11/03/24   Pt to continue Zeposia  ?

## 2024-08-18 ENCOUNTER — Other Ambulatory Visit: Payer: Self-pay | Admitting: Neurology

## 2024-08-19 NOTE — Telephone Encounter (Signed)
 Dr.Athar your are work in provider, Dr.Sater is out of the office Last seen on 07/13/24 Follow up scheduled 11/03/24   Dispensed Days Supply Quantity Provider Pharmacy  MODAFINIL  200 MG TABLET 06/06/2024 30 30 each Sater, Charlie LABOR, MD [facility] [phone] - G...   Rx pending to be signed

## 2024-08-24 ENCOUNTER — Encounter: Payer: Self-pay | Admitting: Hematology and Oncology

## 2024-09-05 ENCOUNTER — Other Ambulatory Visit: Payer: Self-pay | Admitting: Neurology

## 2024-09-06 NOTE — Telephone Encounter (Signed)
 Last seen on 07/13/24 Follow up scheduled on 11/03/24   Dispensed Days Supply Quantity Provider Pharmacy  SERTRALINE  HCL 50 MG TABLET 06/07/2024 90 90 each Sater, Charlie LABOR, MD [facility] [phone] - G.[email or web address]

## 2024-09-10 ENCOUNTER — Encounter: Payer: Self-pay | Admitting: Hematology and Oncology

## 2024-10-08 ENCOUNTER — Other Ambulatory Visit: Payer: Self-pay | Admitting: Neurology

## 2024-10-19 ENCOUNTER — Encounter: Payer: Self-pay | Admitting: Obstetrics and Gynecology

## 2024-10-19 DIAGNOSIS — Z720 Tobacco use: Secondary | ICD-10-CM

## 2024-10-20 ENCOUNTER — Other Ambulatory Visit: Payer: Self-pay | Admitting: Obstetrics and Gynecology

## 2024-10-20 DIAGNOSIS — Z720 Tobacco use: Secondary | ICD-10-CM

## 2024-11-03 ENCOUNTER — Ambulatory Visit: Admitting: Neurology

## 2024-11-03 ENCOUNTER — Encounter: Payer: Self-pay | Admitting: Neurology

## 2024-11-03 VITALS — BP 108/80 | HR 65 | Ht 71.0 in | Wt 253.5 lb

## 2024-11-03 DIAGNOSIS — E559 Vitamin D deficiency, unspecified: Secondary | ICD-10-CM

## 2024-11-03 DIAGNOSIS — N3941 Urge incontinence: Secondary | ICD-10-CM

## 2024-11-03 DIAGNOSIS — G4489 Other headache syndrome: Secondary | ICD-10-CM

## 2024-11-03 DIAGNOSIS — F418 Other specified anxiety disorders: Secondary | ICD-10-CM

## 2024-11-03 DIAGNOSIS — Z9884 Bariatric surgery status: Secondary | ICD-10-CM

## 2024-11-03 DIAGNOSIS — Z79899 Other long term (current) drug therapy: Secondary | ICD-10-CM

## 2024-11-03 DIAGNOSIS — G35A Relapsing-remitting multiple sclerosis: Secondary | ICD-10-CM | POA: Diagnosis not present

## 2024-11-03 DIAGNOSIS — H469 Unspecified optic neuritis: Secondary | ICD-10-CM

## 2024-11-03 MED ORDER — VITAMIN D (ERGOCALCIFEROL) 1.25 MG (50000 UNIT) PO CAPS: 50000.0000 [IU] | ORAL_CAPSULE | ORAL | 3 refills | Status: AC

## 2024-11-03 MED ORDER — SERTRALINE HCL 100 MG PO TABS: 100.0000 mg | ORAL_TABLET | Freq: Every day | ORAL | 4 refills | Status: AC

## 2024-11-03 MED ORDER — MODAFINIL 200 MG PO TABS: 200.0000 mg | ORAL_TABLET | Freq: Every day | ORAL | 1 refills | Status: AC

## 2024-11-03 NOTE — Progress Notes (Signed)
 [facility]  PATIENT: Lynn Roberts DOB: Oct 15, 1986  REFERRING DOCTOR OR PCP:  Dr. Lonni Fairly (Ophthalmology); Eligio Lav, MD (Neuro-hospitalist); Camelia Novak (PCP) SOURCE:   _________________________________   HISTORICAL  CHIEF COMPLAINT:  Chief Complaint  Patient presents with   Multiple Sclerosis    Rm11, alone, Ms follow up    HISTORY OF PRESENT ILLNESS:  Lynn Roberts, is a 38 y.o. woman with relapsing remitting multiple sclerosis.   UPDATE 11/03/2024 She is on Zeposia  for her MS.   She tolerates it well.     Last visit she had some visual chages associated with HA - Toradol  helped to break the HA.  Unlikely to be an exacerbation.  She had increased pain and swelling May 2025.   MRI brain and spine showed  no new lesions  Gait, strengh and sensation are unchanged.   She has no falls.  She will use the bannister on stairs.   She slows down some on long walks.       No numbness or weakness in limbs.  No neck or back pain.    She has urinary urgency with some incontinence.    Conservative methods and medications like oxybutynin  have not been beneficial.  She also has had bowel incontinence x 5 the last 2 weeks.    Oxybutynin  has helped both.  She presented with left optic neuritis.  Vision improved.   Still ha smild reduced VA and color saturation OS..   She still sees  firefly like floaters off/on.      She has had bariatric surgery (duodenal switch) and lost 400+ pounds (650 maximum)  During the pregnancy (delivered daughter 12/2022) she was on Copaxone  and she went back on Zeposia  mid February 2024.  MRI 01/15/2024 showed one new MS focus compared to 02/22/2024 (after she restarted med's).     She had Vitamin A  deficiency during her pregnancy.   She has a h/o gastric bypass and has had B12, copper  and Vit D deficiency.      Vit D was low at 14 and we started 50000 Units weekly - she will change to  5000 daily.   She is sleeping well most  nights but has daily fatigue.    She does not have excessive daytime sleepiness.  Mood is a little worse and sometimes she feels things get to her more.   More stress with her husband had a suicide attempt in March..  He is doing better.    She has occasional nervousness but no severe anxiety.    She is on sertraline . 50 mg - not sure it helps as much as initially.    Cognition is doing well.      MS History: She presented to [facility]/22[facility]2021 after she had the onset of left eye pain, headache and complete loss of vision OS 02/21/2021.  She saw Dr. Loreli (ophthalmology) who advised her to go to the ED.    She had studies and was admitted and received 5 days of IV steroids.  MRI of the orbits confirmed left optic neuritis.  MRI of the brain showed multiple foci consistent with MS.  2 foci in the left hemisphere enhanced after contrast.  MRI of the spine did not show any additional lesions though there was extensive movement artifact that limited the study.   She had no other neurologic episode in the past.   She began to improve a day after the IV Solu-Medrol  was initiated and was  much better by discharge.      She started Zeposia  04/2021.    She became pregnant and stopped Zeposia  05/2021 (was 7-[redacted] weeks pregnant at time)   She started Copaxone  06/2022.    She went back to Zeposia  01/2023.      Other Medical:  She was > 650 pounds and had bariatric surgery (duodenal switch), losing > 400 pounds in 2015.   She had an anal fissure last year requiring surgery and still has some mild bleeding.   She has seen surgery and they have requested a colonoscopy.     She smokes and is trying to quit (using patches).   She has two kids ages 1 and 3.       Imaging:  MRI brain 04/22/2024.  There were no new lesions in the brain  MRI cervical and thoracic spine 04/22/2024:  No lesions in spinal cord.   No significant DJD>    MRI brain 01/2023 showed no new lesions  MRI of the brain 02/22/2021 shows multiple T2/FLAIR  hyperintense foci in the hemispheres in a pattern consistent with multiple sclerosis.  2 foci on the left show subtle enhancement after contrast.  MRI of the orbits 02/22/2021 shows enhancement of the left optic nerve.  MRI of the cervical spine 02/23/2021 limited by movement artifact shows a normal spinal cord and mild multilevel degenerative changes.  MRI of the thoracic spine 02/23/2021 limited by movement artifact shows a normal spinal cord and degenerative changes with mild spinal stenosis at T11-T12 and T12-L1.   Laboratory tests March 2022:   Anti-NMO and anti-MOG are negative.  B12 low at 152 (180-914).   EKG normal 2/.2021  REVIEW OF SYSTEMS: Constitutional: No fevers, chills, sweats, or change in appetite Eyes: No visual changes, double vision, eye pain Ear, nose and throat: No hearing loss, ear pain, nasal congestion, sore throat Cardiovascular: No chest pain, palpitations Respiratory:  No shortness of breath at rest or with exertion.   No wheezes GastrointestinaI: No nausea, vomiting, diarrhea, abdominal pain, fecal incontinence Genitourinary:  No dysuria, urinary retention or frequency.  No nocturia. Musculoskeletal:  No neck pain, back pain Integumentary: No rash, pruritus, skin lesions Neurological: as above Psychiatric: No depression at this time.  No anxiety Endocrine: No palpitations, diaphoresis, change in appetite, change in weigh or increased thirst Hematologic/Lymphatic:  No anemia, purpura, petechiae. Allergic/Immunologic: No itchy/runny eyes, nasal congestion, recent allergic reactions, rashes  ALLERGIES: Allergies  Allergen Reactions   Nsaids Other (See Comments)    HX. OF WEIGHT-LOSS SURGERY   Sulfa Antibiotics Rash   Tramadol Rash    Had after 2014 and tolerated okay    HOME MEDICATIONS:  Current Outpatient Medications:    Copper  Gluconate (COPPER  CAPS) 2 MG CAPS, Take 2 mg by mouth daily., Disp: , Rfl:    cyanocobalamin  (VITAMIN B12) 1000 MCG/ML  injection, INJECT 1 ML INTO THE SKIN EVERY 4 WEEKS, Disp: 3 mL, Rfl: 0   ferrous sulfate  325 (65 FE) MG tablet, Take 325 mg by mouth daily with breakfast., Disp: , Rfl:    Multiple Vitamin (MULTIVITAMIN WITH MINERALS) TABS tablet, Take 1 tablet by mouth daily., Disp: 30 tablet, Rfl: 0   NIFEdipine  (PROCARDIA -XL/NIFEDICAL-XL) 30 MG 24 hr tablet, Take 30 mg by mouth daily., Disp: , Rfl:    nystatin ointment (MYCOSTATIN), Apply 1 Application topically daily as needed (Rash)., Disp: , Rfl:    oxybutynin  (DITROPAN  XL) 15 MG 24 hr tablet, TAKE 1 TABLET (15 MG TOTAL) BY MOUTH IN  THE MORNING, AT NOON, AND AT BEDTIME., Disp: 270 tablet, Rfl: 1   oxybutynin  (DITROPAN ) 5 MG tablet, one po tid (Patient taking differently: Take 15 mg by mouth at bedtime. one po tid), Disp: 270 tablet, Rfl: 3   ZEPOSIA  0.92 MG CAPS, TAKE 1 CAPSULE BY MOUTH 1 TIME A DAY, Disp: 90 capsule, Rfl: 1   modafinil  (PROVIGIL ) 200 MG tablet, Take 1 tablet (200 mg total) by mouth daily., Disp: 90 tablet, Rfl: 1   sertraline  (ZOLOFT ) 100 MG tablet, Take 1 tablet (100 mg total) by mouth daily., Disp: 90 tablet, Rfl: 4   Vitamin D , Ergocalciferol , (DRISDOL ) 1.25 MG (50000 UNIT) CAPS capsule, Take 1 capsule (50,000 Units total) by mouth every 7 (seven) days., Disp: 13 capsule, Rfl: 3  PAST MEDICAL HISTORY: Past Medical History:  Diagnosis Date   Arthritis of low back    Complication of anesthesia    [place] indian heritage   Dysrhythmia    hx PAT   Family history of adverse reaction to anesthesia    pt is a [place] indian   GERD (gastroesophageal reflux disease)    none since pregnancy    Heart murmur    When pt. was pregnant with her last child, and also had murmur as a child.     Hemorrhoids    History of anal fissures    History of PAT (paroxysmal atrial tachycardia)    had cardiac ablation as a teenager   History of sleep apnea    none since 45 pound weight loss in 2015   Migraines    MS (multiple sclerosis)    PCOD  (polycystic ovarian disease)    no menses    PAST SURGICAL HISTORY: Past Surgical History:  Procedure Laterality Date   BREAST LUMPECTOMY Left 2011   papilloma   BREAST LUMPECTOMY Left 2015   papilloma   BREAST LUMPECTOMY WITH RADIOACTIVE SEED LOCALIZATION Left 10/18/2014   Procedure:  RADIOACTIVE SEED GUIDED EXCISIONAL BREAST BIOPSY;  Surgeon: Alm Angle, MD;  Location: [facility];  Service: General;  Laterality: Left;   BREAST SURGERY Right 07/27/2004   retro-areolar dissection with exc. of large duct   CARDIAC ELECTROPHYSIOLOGY MAPPING AND ABLATION  sge 13   CARDIAC ELECTROPHYSIOLOGY STUDY AND ABLATION  age 65   CESAREAN SECTION N/A 03/28/2020   Procedure: CESAREAN SECTION;  Surgeon: Sudie Lavonia HERO, MD;  Location: [facility];  Service: Obstetrics;  Laterality: N/A;  RNFA Tracey   CESAREAN SECTION  03/2018   CESAREAN SECTION WITH BILATERAL TUBAL LIGATION Bilateral 12/27/2022   Procedure: CESAREAN SECTION WITH BILATERAL TUBAL LIGATION;  Surgeon: Sudie Lavonia HERO, MD;  Location: [facility];  Service: Obstetrics;  Laterality: Bilateral;  incr r/o malignant hyperthermia with isoflurane anesthetic induction as patient is [place] ind[place]   CHOLECYSTECTOMY  04/27/2008   EVALUATION UNDER ANESTHESIA WITH ANAL FISTULECTOMY N/A 02/10/2023   Procedure: CONTROL OF ANAL FISTULAS WITH PLACEMENT OF SETON AND ANORECTAL EXAM UNDER ANESTHESIA;  Surgeon: Teresa Lonni HERO, MD;  Location: [facility];  Service: General;  Laterality: N/A;   GASTROPLASTY DUODENAL SWITCH  01/2014   done at [facility]   INCISION AND DRAINAGE ABSCESS Bilateral 10/11/2022   Procedure: Incision and drainage of Bilateral Perirectoal abscess, Possible Seton;  Surgeon: Ebbie Cough, MD;  Location[facility];  Service: General;  Laterality: Bilateral;   INCISION AND DRAINAGE PERIRECTAL ABSCESS N/A 08/15/2020   Procedure: ANORECTAL EXAM UNDER ANESTHESIA INCISION AND DRAINAGE OF PERIRECTAL ABSCESS x2 WITH PLACEMENT OF SETON;  Surgeon: Teresa Lonni HERO, MD;  Location: [facility];  Service: General;  Laterality: N/A;   LIGATION OF INTERNAL FISTULA TRACT N/A 08/13/2024   Procedure: LIGATION, INTERNAL FISTULA TRACT;  Surgeon: Teresa Lonni HERO, MD;  Location: [facility];  Service: General;  Laterality: N/A;   OVARIAN CYST REMOVAL Bilateral 1999   PLACEMENT OF SETON  08/15/2020   PLACEMENT OF SETON N/A 12/08/2020   Procedure: PLACEMENT OF CUTTING SETON; PARTIAL FISTULOTOMY;  Surgeon: Teresa Lonni HERO, MD;  Location: [facility];  Service: General;  Laterality: N/A;   RECTAL EXAM UNDER ANESTHESIA N/A 12/08/2020   Procedure: ANORECTAL EXAM UNDER ANESTHESIA;  Surgeon: Teresa Lonni HERO, MD;  Location:  SURGERY CENTER;  Service: General;  Laterality: N/A;   RECTAL EXAM UNDER ANESTHESIA N/A 08/13/2024   Procedure: EXAM UNDER ANESTHESIA, RECTUM;  Surgeon: Teresa Lonni HERO, MD;  Location:  SURGERY CENTER;  Service: General;  Laterality: N/A;   TOENAIL EXCISION  yrs ago   UNILATERAL SALPINGECTOMY      FAMILY HISTORY: Family History  Problem Relation Age of Onset   Hypertension Mother    Diabetes Mother    Hypertension Father    Hyperlipidemia Father    Cancer Maternal Grandmother        multiple myaloma, breast cancer   Heart disease Maternal Grandmother    Diabetes Maternal Grandfather    Cancer Paternal Grandfather        pancreactic   Colon cancer Neg Hx    Stomach cancer Neg Hx    Rectal cancer Neg Hx    Esophageal cancer Neg Hx     SOCIAL HISTORY:  Social History   Socioeconomic History   Marital status: Married    Spouse name: Toribio   Number of children: 2   Years of education: 12   Highest education level: Not on file  Occupational History   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    Lives with husband   Caffeine use: 1 cup per day of tea   Pt works    Social Drivers of Corporate Investment Banker Strain: Not on file  Food Insecurity: No Food Insecurity (12/30/2022)   Hunger Vital Sign    Worried About Running Out of Food in the Last Year: Never true    Ran Out of Food in the Last Year: Never true  Transportation Needs: No Transportation Needs (12/30/2022)   PRAPARE - Administrator, Civil Service (Medical): No    Lack of Transportation (Non-Medical): No  Physical Activity: Not on file  Stress: Not on file  Social Connections: Not on file  Intimate Partner Violence: Not At Risk (12/27/2022)   Humiliation, Afraid, Rape, and Kick questionnaire    Fear of Current or Ex-Partner: No    Emotionally Abused: No    Physically Abused: No  Sexually Abused: No     PHYSICAL EXAM  Vitals:   11/03/24 1330  BP: 108/80  Pulse: 65  SpO2: 98%  Weight: 253 lb 8 oz (115 kg)  Height: 5' 11 (1.803 m)    Body mass index is 35.36 kg/m.   VA: OD:   20/40-1 OS:   20/30  Colors desat'd OD   General: The patient is well-developed and well-nourished and in no acute distress  HEENT:  Head is [place]/AT.  Sclera are anicteric.    Skin: Extremities are without rash or edema.  Neurologic Exam  Mental status: The patient is alert and oriented x 3 at the time of the examination. The patient has apparent normal recent and remote memory, with an apparently normal attention span and concentration ability.   Speech is normal.  Cranial nerves: Extraocular movements are full.  Reduced color vision OS.  Visual acuity is  fairly symmetrical.  There is good facial sensation to soft touch bilaterally.Facial strength is normal.  No obvious hearing deficits are noted.  Motor:  Muscle bulk is normal.   Tone is normal. Strength is  5 / 5 in all 4 extremities.   Sensory: Sensory testing is intact to pinprick, soft touch and vibration sensation in all 4 extremities.  Coordination: Cerebellar testing reveals good finger-nose-finger and heel-to-shin bilaterally.  Gait and station: Station is normal.   Gait is normal.  Her tandem gait is mildly wide.  Reflexes: Deep tendon reflexes are symmetric and normal bilaterally.      DIAGNOSTIC DATA (LABS, IMAGING, TESTING) - I reviewed patient records, labs, notes, testing and imaging myself where available.  Lab Results  Component Value Date   WBC 5.7 05/21/2024   HGB 12.7 05/21/2024   HCT 38.6 05/21/2024   MCV 82.7 05/21/2024   PLT 230 05/21/2024      Component Value Date/Time   NA 140 05/21/2024 1511   NA 142 05/28/2023 1600   K 3.9 05/21/2024 1511   CL 109 05/21/2024 1511   CO2 22 05/21/2024 1511   GLUCOSE 86 05/21/2024 1511   BUN 9 05/21/2024 1511   BUN 7 05/28/2023 1600   CREATININE 0.56 05/21/2024 1511   CREATININE 0.62 03/03/2020 1452   CALCIUM  8.8 (L) 05/21/2024 1511   PROT 6.5 05/21/2024 1511   PROT 6.1 05/28/2023 1600   ALBUMIN 3.8 05/21/2024 1511   ALBUMIN 3.9 05/28/2023 1600   AST 16 05/21/2024 1511   AST 10 (L) 03/03/2020 1452   ALT 9 05/21/2024 1511   ALT 10 03/03/2020 1452   ALKPHOS 59 05/21/2024 1511   BILITOT 0.4 05/21/2024 1511   BILITOT 0.4 05/28/2023 1600   BILITOT 0.4 03/03/2020 1452   GFRNONAA >60 05/21/2024 1511   GFRNONAA >60 03/03/2020 1452   GFRAA >60 08/15/2020 0354   GFRAA >60 03/03/2020 1452   No results found for: CHOL, HDL, LDLCALC, LDLDIRECT, TRIG, CHOLHDL Lab Results  Component Value Date   HGBA1C 4.6 (L) 11/05/2017   Lab Results  Component Value Date   VITAMINB12 450 04/05/2024   Lab Results   Component Value Date   TSH 1.620 05/28/2023       ASSESSMENT AND PLAN  Multiple sclerosis, relapsing-remitting - Plan: CBC with Differential/Platelets  Other headache syndrome  Urge incontinence  S/P bariatric surgery  Vitamin D  deficiency  Optic neuritis  Depression with anxiety  High risk medication use - Plan: CBC with Differential/Platelets   Continue Zeposia .  Check labs.   Last MRI was stable She has  had gastric bypass/duodenal switch.  Continue supplements.     Stay active and exercise as tolerated. Rtc 6 months or call sooner if new or worsening issues.       Samyiah Halvorsen A. Vear, MD, Teola RENO 11/03/2024, 2:04 PM Certified in Neurology, Clinical Neurophysiology, Sleep Medicine and Neuroimaging  [facility] [address], [address] [place], [place] [address] [phone]

## 2024-11-04 ENCOUNTER — Ambulatory Visit: Payer: Self-pay | Admitting: Neurology

## 2024-11-04 LAB — CBC WITH DIFFERENTIAL/PLATELET
Basophils Absolute: 0 x10E3/uL (ref 0.0–0.2)
Basos: 1 %
EOS (ABSOLUTE): 0.1 x10E3/uL (ref 0.0–0.4)
Eos: 2 %
Hematocrit: 41.9 % (ref 34.0–46.6)
Hemoglobin: 12.9 g/dL (ref 11.1–15.9)
Immature Grans (Abs): 0 x10E3/uL (ref 0.0–0.1)
Immature Granulocytes: 0 %
Lymphocytes Absolute: 1 x10E3/uL (ref 0.7–3.1)
Lymphs: 19 %
MCH: 26.8 pg (ref 26.6–33.0)
MCHC: 30.8 g/dL — ABNORMAL LOW (ref 31.5–35.7)
MCV: 87 fL (ref 79–97)
Monocytes Absolute: 0.5 x10E3/uL (ref 0.1–0.9)
Monocytes: 9 %
Neutrophils Absolute: 3.7 x10E3/uL (ref 1.4–7.0)
Neutrophils: 69 %
Platelets: 209 x10E3/uL (ref 150–450)
RBC: 4.81 x10E6/uL (ref 3.77–5.28)
RDW: 12.4 % (ref 11.7–15.4)
WBC: 5.3 x10E3/uL (ref 3.4–10.8)

## 2024-12-16 ENCOUNTER — Other Ambulatory Visit: Payer: Self-pay

## 2024-12-16 ENCOUNTER — Other Ambulatory Visit: Payer: Self-pay | Admitting: Neurology

## 2024-12-25 ENCOUNTER — Telehealth: Admitting: Nurse Practitioner

## 2024-12-25 DIAGNOSIS — J0100 Acute maxillary sinusitis, unspecified: Secondary | ICD-10-CM | POA: Diagnosis not present

## 2024-12-25 MED ORDER — IPRATROPIUM BROMIDE 0.03 % NA SOLN: 2.0000 | Freq: Two times a day (BID) | NASAL | 0 refills | Status: AC

## 2024-12-25 MED ORDER — LIDOCAINE VISCOUS HCL 2 % MT SOLN: 15.0000 mL | OROMUCOSAL | 0 refills | Status: AC | PRN

## 2024-12-25 MED ORDER — AMOXICILLIN-POT CLAVULANATE 875-125 MG PO TABS: 1.0000 | ORAL_TABLET | Freq: Two times a day (BID) | ORAL | 0 refills | Status: AC

## 2024-12-25 NOTE — Progress Notes (Signed)
"      E-Visit for Sinus Problems  We are sorry that you are not feeling well.  Here is how we plan to help!  Based on what you have shared with me it looks like you have sinusitis.  Sinusitis is inflammation and infection in the sinus cavities of the head.  Based on your presentation I believe you most likely have Acute Bacterial Sinusitis.  This is an infection caused by bacteria and is treated with antibiotics. I have prescribed viscous lidocaine  for sore throat,  Augmentin  875mg /125mg  one tablet twice daily with food, for 7 days. and I have also prescribed Ipratropium Bromide  Nasal Spray Use 1 spray in each nostril twice daily as needed for drainage; discontinue if too drying You may use an oral decongestant such as Mucinex D or if you have glaucoma or high blood pressure use plain Mucinex. Saline nasal spray help and can safely be used as often as needed for congestion.  If you develop worsening sinus pain, fever or notice severe headache and vision changes, or if symptoms are not better after completion of antibiotic, please schedule an appointment with a health care provider.    Sinus infections are not as easily transmitted as other respiratory infection, however we still recommend that you avoid close contact with loved ones, especially the very young and elderly.  Remember to wash your hands thoroughly throughout the day as this is the number one way to prevent the spread of infection!  Home Care: Only take medications as instructed by your medical team. Complete the entire course of an antibiotic. Do not take these medications with alcohol. A steam or ultrasonic humidifier can help congestion.  You can place a towel over your head and breathe in the steam from hot water  coming from a faucet. Avoid close contacts especially the very young and the elderly. Cover your mouth when you cough or sneeze. Always remember to wash your hands.  Get Help Right Away If: You develop worsening fever or  sinus pain. You develop a severe head ache or visual changes. Your symptoms persist after you have completed your treatment plan.  Make sure you Understand these instructions. Will watch your condition. Will get help right away if you are not doing well or get worse.  Your e-visit answers were reviewed by a board certified advanced clinical practitioner to complete your personal care plan.  Depending on the condition, your plan could have included both over the counter or prescription medications.  If there is a problem please reply  once you have received a response from your provider.  Your safety is important to us .  If you have drug allergies check your prescription carefully.    You can use MyChart to ask questions about todays visit, request a non-urgent call back, or ask for a work or school excuse for 24 hours related to this e-Visit. If it has been greater than 24 hours you will need to follow up with your provider, or enter a new e-Visit to address those concerns.  You will get an e-mail in the next two days asking about your experience.  I hope that your e-visit has been valuable and will speed your recovery. Thank you for using e-visits.  I have spent 5 minutes in review of e-visit questionnaire, review and updating patient chart, medical decision making and response to patient.   Haze LELON Servant, NP     "

## 2025-01-01 ENCOUNTER — Other Ambulatory Visit: Payer: Self-pay | Admitting: Neurology

## 2025-01-07 ENCOUNTER — Other Ambulatory Visit: Payer: Self-pay | Admitting: Neurology

## 2025-01-14 ENCOUNTER — Other Ambulatory Visit

## 2025-06-15 ENCOUNTER — Ambulatory Visit: Admitting: Neurology
